# Patient Record
Sex: Female | Born: 1960 | Race: White | Hispanic: No | Smoking: Current every day smoker
Health system: Southern US, Community
[De-identification: ages and names within clinical notes are randomized; demographics above are authoritative.]

## PROBLEM LIST (undated history)

## (undated) DIAGNOSIS — M5430 Sciatica, unspecified side: Secondary | ICD-10-CM

## (undated) DIAGNOSIS — R112 Nausea with vomiting, unspecified: Secondary | ICD-10-CM

## (undated) DIAGNOSIS — I10 Essential (primary) hypertension: Secondary | ICD-10-CM

## (undated) DIAGNOSIS — J3489 Other specified disorders of nose and nasal sinuses: Secondary | ICD-10-CM

## (undated) DIAGNOSIS — C50412 Malignant neoplasm of upper-outer quadrant of left female breast: Principal | ICD-10-CM

## (undated) DIAGNOSIS — C50919 Malignant neoplasm of unspecified site of unspecified female breast: Secondary | ICD-10-CM

## (undated) DIAGNOSIS — E7800 Pure hypercholesterolemia, unspecified: Secondary | ICD-10-CM

## (undated) DIAGNOSIS — Z923 Personal history of irradiation: Secondary | ICD-10-CM

## (undated) DIAGNOSIS — Z9889 Other specified postprocedural states: Secondary | ICD-10-CM

## (undated) DIAGNOSIS — L309 Dermatitis, unspecified: Secondary | ICD-10-CM

## (undated) DIAGNOSIS — T4145XA Adverse effect of unspecified anesthetic, initial encounter: Secondary | ICD-10-CM

## (undated) DIAGNOSIS — F419 Anxiety disorder, unspecified: Secondary | ICD-10-CM

## (undated) DIAGNOSIS — M1990 Unspecified osteoarthritis, unspecified site: Secondary | ICD-10-CM

## (undated) DIAGNOSIS — T464X5A Adverse effect of angiotensin-converting-enzyme inhibitors, initial encounter: Secondary | ICD-10-CM

## (undated) DIAGNOSIS — F329 Major depressive disorder, single episode, unspecified: Secondary | ICD-10-CM

## (undated) DIAGNOSIS — R05 Cough: Secondary | ICD-10-CM

## (undated) DIAGNOSIS — T8859XA Other complications of anesthesia, initial encounter: Secondary | ICD-10-CM

## (undated) DIAGNOSIS — R3915 Urgency of urination: Secondary | ICD-10-CM

## (undated) DIAGNOSIS — K52832 Lymphocytic colitis: Secondary | ICD-10-CM

## (undated) DIAGNOSIS — G43909 Migraine, unspecified, not intractable, without status migrainosus: Secondary | ICD-10-CM

## (undated) DIAGNOSIS — F32A Depression, unspecified: Secondary | ICD-10-CM

## (undated) HISTORY — DX: Lymphocytic colitis: K52.832

## (undated) HISTORY — PX: WISDOM TOOTH EXTRACTION: SHX21

## (undated) HISTORY — DX: Depression, unspecified: F32.A

## (undated) HISTORY — PX: COLONOSCOPY: SHX174

## (undated) HISTORY — PX: TUBAL LIGATION: SHX77

## (undated) HISTORY — PX: KNEE ARTHROSCOPY W/ DEBRIDEMENT: SHX1867

## (undated) HISTORY — PX: TONSILLECTOMY: SUR1361

## (undated) HISTORY — DX: Major depressive disorder, single episode, unspecified: F32.9

## (undated) HISTORY — DX: Malignant neoplasm of upper-outer quadrant of left female breast: C50.412

## (undated) HISTORY — PX: BREAST BIOPSY: SHX20

## (undated) HISTORY — DX: Anxiety disorder, unspecified: F41.9

## (undated) HISTORY — PX: BREAST LUMPECTOMY: SHX2

---

## 1997-07-07 ENCOUNTER — Ambulatory Visit (HOSPITAL_COMMUNITY): Admission: RE | Admit: 1997-07-07 | Discharge: 1997-07-07 | Payer: Self-pay | Admitting: Obstetrics & Gynecology

## 1999-07-07 ENCOUNTER — Ambulatory Visit (HOSPITAL_BASED_OUTPATIENT_CLINIC_OR_DEPARTMENT_OTHER): Admission: RE | Admit: 1999-07-07 | Discharge: 1999-07-07 | Payer: Self-pay | Admitting: *Deleted

## 2005-06-30 ENCOUNTER — Other Ambulatory Visit: Admission: RE | Admit: 2005-06-30 | Discharge: 2005-06-30 | Payer: Self-pay | Admitting: Obstetrics & Gynecology

## 2005-07-12 ENCOUNTER — Encounter: Admission: RE | Admit: 2005-07-12 | Discharge: 2005-07-12 | Payer: Self-pay | Admitting: Obstetrics & Gynecology

## 2005-09-29 ENCOUNTER — Ambulatory Visit (HOSPITAL_BASED_OUTPATIENT_CLINIC_OR_DEPARTMENT_OTHER): Admission: RE | Admit: 2005-09-29 | Discharge: 2005-09-29 | Payer: Self-pay | Admitting: Obstetrics & Gynecology

## 2005-09-29 HISTORY — PX: ENDOMETRIAL ABLATION: SHX621

## 2005-09-29 HISTORY — PX: INCONTINENCE SURGERY: SHX676

## 2005-09-29 HISTORY — PX: CYSTO: SHX6284

## 2007-07-18 ENCOUNTER — Other Ambulatory Visit: Admission: RE | Admit: 2007-07-18 | Discharge: 2007-07-18 | Payer: Self-pay | Admitting: Obstetrics & Gynecology

## 2007-08-06 ENCOUNTER — Encounter: Admission: RE | Admit: 2007-08-06 | Discharge: 2007-08-06 | Payer: Self-pay | Admitting: Obstetrics & Gynecology

## 2007-10-10 ENCOUNTER — Ambulatory Visit: Payer: Self-pay | Admitting: Gastroenterology

## 2007-10-10 DIAGNOSIS — R1084 Generalized abdominal pain: Secondary | ICD-10-CM

## 2007-10-10 DIAGNOSIS — R197 Diarrhea, unspecified: Secondary | ICD-10-CM

## 2007-10-10 LAB — CONVERTED CEMR LAB: IgA: 109 mg/dL (ref 68–378)

## 2007-10-14 LAB — CONVERTED CEMR LAB: Tissue Transglutaminase Ab, IgA: 0.1 units (ref <7)

## 2007-10-30 ENCOUNTER — Encounter: Payer: Self-pay | Admitting: Gastroenterology

## 2007-10-30 ENCOUNTER — Ambulatory Visit: Payer: Self-pay | Admitting: Gastroenterology

## 2007-10-31 ENCOUNTER — Encounter: Payer: Self-pay | Admitting: Gastroenterology

## 2007-11-01 ENCOUNTER — Encounter: Payer: Self-pay | Admitting: Gastroenterology

## 2007-11-06 ENCOUNTER — Telehealth: Payer: Self-pay | Admitting: Gastroenterology

## 2007-12-12 ENCOUNTER — Ambulatory Visit: Payer: Self-pay | Admitting: Gastroenterology

## 2007-12-12 DIAGNOSIS — K5289 Other specified noninfective gastroenteritis and colitis: Secondary | ICD-10-CM

## 2008-01-10 ENCOUNTER — Encounter: Admission: RE | Admit: 2008-01-10 | Discharge: 2008-01-10 | Payer: Self-pay | Admitting: Orthopedic Surgery

## 2008-02-07 ENCOUNTER — Ambulatory Visit: Payer: Self-pay | Admitting: Gastroenterology

## 2008-02-07 DIAGNOSIS — R142 Eructation: Secondary | ICD-10-CM

## 2008-02-07 DIAGNOSIS — R141 Gas pain: Secondary | ICD-10-CM | POA: Insufficient documentation

## 2008-02-07 DIAGNOSIS — R143 Flatulence: Secondary | ICD-10-CM

## 2008-07-23 ENCOUNTER — Other Ambulatory Visit: Admission: RE | Admit: 2008-07-23 | Discharge: 2008-07-23 | Payer: Self-pay | Admitting: Obstetrics & Gynecology

## 2009-04-02 ENCOUNTER — Telehealth: Payer: Self-pay | Admitting: Gastroenterology

## 2010-02-23 ENCOUNTER — Telehealth: Payer: Self-pay | Admitting: Gastroenterology

## 2010-02-24 ENCOUNTER — Ambulatory Visit: Payer: Self-pay | Admitting: Internal Medicine

## 2010-02-24 DIAGNOSIS — I10 Essential (primary) hypertension: Secondary | ICD-10-CM | POA: Insufficient documentation

## 2010-02-24 DIAGNOSIS — F329 Major depressive disorder, single episode, unspecified: Secondary | ICD-10-CM

## 2010-02-24 DIAGNOSIS — F411 Generalized anxiety disorder: Secondary | ICD-10-CM | POA: Insufficient documentation

## 2010-06-07 NOTE — Progress Notes (Signed)
Summary: Medication refill  Phone Note Call from Patient Call back at Home Phone [phone]   Caller: Patient Call For: Dr. Russella Dar Reason for Call: Refill Medication Summary of Call: Needs refill on her Entocort.Marland KitchenMarland KitchenMarland KitchenSharl Ma [facility] Initial call taken by: Karna Christmas,  February 23, 2010 1:38 PM  Follow-up for Phone Call        Patient has hx of lymphocytic colitis.  Patient c/o several week hx of diarrhea , urgency, cramping.  Reports 5-6 loose watery BM day.  Denies any recent travel, antibiotic use, or exposure to others with diarrhea.  Denies fever, nausea, vomiting, or rectal bleeding.  She is requesting a refill of Entocort.  Patient  not seen here since 02/2008.  Dr Russella Dar please advise. Follow-up by: Darcey Nora RN, CGRN,  February 23, 2010 2:48 PM  Additional Follow-up for Phone Call Additional follow up Details #1::        Needs REV with [place] for ongoing treatment or can get treatment with her PCP. REV with GI extender in next 1-2 days for evaluation. If neither of those options work we can resume Entocort 9mg  by mouth qam for 4 weeks, no refills and REV with [place] within 4 week for additional refills and management.  Additional Follow-up by: Meryl Dare MD FACG,  February 23, 2010 3:16 PM    Additional Follow-up for Phone Call Additional follow up Details #2::    patient will come in and see Mike Gip PA at 3:00 Follow-up by: Darcey Nora RN, CGRN,  February 23, 2010 3:37 PM

## 2010-06-07 NOTE — Assessment & Plan Note (Signed)
Summary: diarrhea/ hx of lymphocytic colitis/sheri   History of Present Illness Visit Type: Follow-up Visit Primary GI MD: Elie Goody MD [facility] Primary Provider: Benedetto Goad, MD Requesting Provider: na Chief Complaint: Lymphocytic Colitis. Pt c/o diarrhea  History of Present Illness:   PLEASANT 50 YO FEMALE, KNOWN TO DR. SATRK WITH HX OF LYMPHOCYTIC COLITIS. SHE WAS DX IN 2009, AND HAS RESPONDED WELL TO ENTOCORT. SHE HAS NOT BEEN SEEN FOR OVER A YEAR, AND SAYS SHE HAD BEEN DOING WELL WITH NO SXS. SHE HAD ONSET ABOUT 2 WEEKS AGO WITH RECURRENT DIARRHEA. SXS HAVE PROGRESSED AND SHE IS NOW HAVING MULTIPLE WATERY STOOLS PER DAY. NO N/V, APPETITE OK,NO FEVER/CHILLS. NO BLEEDING. 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 NO NEW MEDS OVER THE PAST FEW WEEKS-DID START CYMBALTA ,AND KLONOPIN 6-8 WEEKS AGO, NO RECENT ABX.   GI Review of Systems      Denies abdominal pain, acid reflux, belching, bloating, chest pain, dysphagia with liquids, dysphagia with solids, heartburn, loss of appetite, nausea, vomiting, vomiting blood, weight loss, and  weight gain.      Reports diarrhea.     Denies anal fissure, black tarry stools, change in bowel habit, constipation, diverticulosis, fecal incontinence, heme positive stool, hemorrhoids, irritable bowel syndrome, jaundice, light color stool, liver problems, rectal bleeding, and  rectal pain.    Current Medications (verified): 1)  Methocarbamol 500 Mg  Tabs (Methocarbamol) .... As Needed Q8h 2)  Excedrin Migraine 250-250-65 Mg  Tabs (Aspirin-Acetaminophen-Caffeine) .... As Needed 3)  Vitamin B-12 Cr 1000 Mcg  Tbcr (Cyanocobalamin) .... Once Daily 4)  Vitamin C 1000 Mg  Tabs (Ascorbic Acid) .... Once Daily 5)  Calcium Citrate-Vitamin D 1500-200 Mg-Unit  Tabs (Calcium Citrate-Vitamin D) .... Once Daily 6)  Ibutab 200 Mg  Tabs (Ibuprofen) .... As Needed 7)  Entocort Ec 3 Mg  Cp24  (Budesonide) .... Take 3 Capsules Daily(Need Refill) 8)  Cymbalta 60 Mg Cpep (Duloxetine Hcl) .... One Tablet By Mouth Once Daily 9)  Klonopin 1 Mg Tabs (Clonazepam) .Marland Kitchen.. 1 To 1/2 By Mouth Two Times A Day  Allergies (verified): 1)  ! * Tape 2)  ! * Latex  Past History:  Past Medical History: Reviewed history from 02/06/2008 and no changes required. Anxiety Disorder Arthritis Chronic Headaches Depression Hypertension Lympocytic colitis Internal hemorrhoids Menometrorrhagia Cystocele  Past Surgical History: C-section x2 1989 & 1997 Uterine Oblation -2007 COLONOSCOPY 2009  Family History: Reviewed history from 10/10/2007 and no changes required. Family History of Colon Cancer: father age 41-65 Family History of Colon Polyps: Family History of Diabetes:   Social History: Patient currently smokes.  less than 1 ppd Alcohol Use - no Patient does not get regular exercise.  Pt is divorced with 2 children  Review of Systems  The patient denies allergy/sinus, anemia, anxiety-new, arthritis/joint pain, back pain, blood in urine, breast changes/lumps, change in vision, confusion, cough, coughing up blood, depression-new, fainting, fatigue, fever, headaches-new, hearing problems, heart murmur, heart rhythm changes, itching, menstrual pain, muscle pains/cramps, night sweats, nosebleeds, pregnancy symptoms, shortness of breath, skin rash, sleeping problems, sore throat, swelling of feet/legs, swollen lymph glands, thirst - excessive , urination - excessive , urination changes/pain, urine leakage, vision changes, and voice change.         SEE HPI  Vital Signs:  Patient profile:   50 year old female Height:      68 inches Weight:  193 pounds BMI:     29.45 BSA:     2.01 Pulse rate:   76 / minute Pulse rhythm:   regular BP sitting:   128 / 86  (left arm) Cuff size:   regular  Vitals Entered By: Ok Anis CMA (February 24, 2010 3:09 PM)  Physical Exam  General:  Well  developed, well nourished, no acute distress. Head:  Normocephalic and atraumatic. Eyes:  PERRLA, no icterus. Lungs:  Clear throughout to auscultation. Heart:  Regular rate and rhythm; no murmurs, rubs,  or bruits. Abdomen:  SOFT, NO FOCAL TENDERNESS, NO MASS OR HSM,BS+ Rectal:  NOT DONE Extremities:  No clubbing, cyanosis, edema or deformities noted. Neurologic:  Alert and  oriented x4;  grossly normal neurologically. Psych:  Alert and cooperative. Normal mood and affect.   Impression & Recommendations:  Problem # 1:  OTH&UNSPEC NONINFECTIOUS GASTROENTERITIS&COLITIS (ICD-558.9) Assessment Deteriorated 50 YO FEMALE WITH KNOWN LYMPHOCYTIC COLITIS WITH EXACERBATION  START ENTOCORT 3MG - 3 TABLETS DAILY X ONE MONTH, THEN IF DOING WELL TAPER TO 6 MG DAILY X ONE MONTH, THEN TO 3 MG DAILY X ONE MONTH. PT ADVISED TO CALL IN THE INTERIM FOR PROBLEMS,INCREASED DIARRHEA ETC. BENTYL 10 MG TWICE DAILY  AS NEEDED. DISCUSSED AVOIDANCE OF NSAIDS. ROV WITH DR,. SRARK AS NEEDED  Problem # 2:  CARCINOMA, COLON, FAMILY HX (ICD-V16.0) Assessment: Comment Only DUE FOR FOLLOW UP COLONOSCOPY 2014  Problem # 3:  ANXIETY (ICD-300.00) Assessment: Comment Only  Problem # 4:  HYPERTENSION (ICD-401.9) Assessment: Comment Only  Problem # 5:  DEPRESSION (ICD-311) Assessment: Comment Only  Patient Instructions: 1)  On the Entocort, take 9 mg ( 3 tab ) daily for 1 month, then 6 mg ( 2 tab) daily for 1 month,  then 3 mg (1 tab) . 2)  We sent the prescription for Entocort to [facility]. 3)  We also sent a prescription for Bentyl 10 mg for spams and cramping. 4)  Copy sent to : Benedetto Goad, Md 5)  The medication list was reviewed and reconciled.  All changed / newly prescribed medications were explained.  A complete medication list was provided to the patient / caregiver. Prescriptions: ENTOCORT EC 3 MG XR24H-CAP (BUDESONIDE) Tale 3 tab daily  #90 x 4   Entered by:   Lowry Ram NCMA   Authorized  by:   Sammuel Cooper PA-c   Signed by:   Lowry Ram NCMA on 02/24/2010   Method used:   Electronically to        [facility] (retail)       [address]       [place], [place]  [address]       Ph: [phone]       Fax: [phone]   RxID:   [id number] BENTYL 10 MG CAPS (DICYCLOMINE HCL) Take 1 tab twice daily as needed for cramping  #20 x 1   Entered by:   Lowry Ram NCMA   Authorized by:   Sammuel Cooper PA-c   Signed by:   Lowry Ram NCMA on 02/24/2010   Method used:   Electronically to        [id number] (retail)       [address]       [place], [place]  [address]       Ph: [phone]       Fax: [phone]   RxID:   [id number]

## 2010-09-23 NOTE — Op Note (Signed)
NAMEGAIL, [name]             ACCOUNT NO.:  [id number]   MEDICAL RECORD NO.:  [id number]          PATIENT TYPE:  AMB   LOCATION:  [facility]                         FACILITY:  [facility]   PHYSICIAN:  Jamison Neighbor, M.D.  DATE OF BIRTH:  1960/07/09   DATE OF PROCEDURE:  09/29/2005  DATE OF DISCHARGE:                                 OPERATIVE REPORT   PREOPERATIVE DIAGNOSES:  1.  Menometrorrhagia.  2.  Stress urinary incontinence.  3.  Cystocele.   POSTOPERATIVE DIAGNOSES:  1.  Menometrorrhagia.  2.  Stress urinary incontinence.  3.  Cystocele.   PROCEDURE:  1.  Hysteroscopy with ablation by Dr. Judie Petit. Leda Quail.  2.  Cystoscopy with anterior repair and sling by Dr. Jamison Neighbor.   ANESTHESIA:  General.   COMPLICATIONS:  None.   DRAINS:  None.   INDICATIONS FOR PROCEDURE:  This 50 year old female was scheduled to undergo  an endometrial ablation by Dr. Hyacinth Meeker, had some problems with stress urinary  incontinence as well as a modest cystocele.  The patient would like to have  a pubo-vaginal sling placed at the time of her surgery.  She understands the  risks and benefits of the procedure, including the possibility that she will  have some voiding dysfunction postoperatively including urgency or urgency  incontinence, as well as difficulty emptying.  She gave a full informed  consent.   DESCRIPTION OF PROCEDURE:  After the successful induction of general  anesthesia the patient was placed in the dorsal lithotomy position and  prepped with Betadine and draped in the usual sterile fashion.  After  completion of her procedure by Dr. Hyacinth Meeker, inspection showed that she had  modest cystocele and some urethral immobility.  She did not have a big  rectocele.  She had a normal uterus and certainly is not felt to be a  candidate for a hysterectomy.  The anterior vaginal mucosa was infiltrated  with local anesthetic.  An incision was made beginning at the mid-urethra  and extending  back towards the uterus.  The flaps were raised bilaterally.  Hemostasis was obtained with electrocautery.  Horizontal mattress sutures of  #2-0 Vicryl were used to reduce her small cystocele.  This was certainly not  large and it was not felt that a mesh repair was required.  The dissection  proceeded posteriorly back to, but not through the endo-pelvic fascia.  After that had been set up, two small stab incisions were made approximately  three finger breadths apart directly above the pubic bone.  A guide needle  was then passed from the two abdominal incisions, down to the vaginal  incision, piercing through the endocervical fascia.  The bladder was then  carefully inspected with both 12-degree and 70-degree lenses.  There was no  injury to the bladder on either side, but on the right-hand side it was felt  that the needle was a little closer than was optimal, so this was  repositioned and the cystoscopy was repeated.  Once again there was no  injury to the bladder.  The needles appeared to be in a good position  and  the bladder was otherwise normal.  The patient does have a smoking history  and had a very careful inspection of the bladder with no tumors or stones  seen.  The ureteral orifices were felt to be unremarkable in their  appearance.  The sling was then pulled into position.  A repeat cystoscopy  showed that there was nice coaptation of the bladder neck but no excessive  angulation, and it appeared that there was good support for the bladder  neck.  The sling was then tensioned so that there was a space between the  urethra and the sling itself, large enough to take a Mayo scissors, so that  there was no over-correction.  The protective sheath was cut away and the  sling was then trimmed at the skin level.  The incision was irrigated and  the excessive mucosa was trimmed.  The incision was then closed with a  running suture of #2-0 Vicryl.  The final inspection showed that good   support had been obtained.  There did not appear to be excessive angulation.   The patient tolerated the procedure and was taken to the recovery room in  good condition.   DISPOSITION:  She will be sent home if she urinates normally.  Otherwise she  will go home for a few days with a Foley catheter.           ______________________________  Jamison Neighbor, M.D.  Electronically Signed     RJE/MEDQ  D:  09/29/2005  T:  09/29/2005  Job:  [id number]   cc:   M. Leda Quail, MD

## 2010-09-23 NOTE — Op Note (Signed)
NAME:  Anna, Barnes             ACCOUNT NO.:  [id number]   MEDICAL RECORD NO.:  [id number]          PATIENT TYPE:  AMB   LOCATION:  [facility]                         FACILITY:  [facility]   PHYSICIAN:  M. Leda Quail, MD  DATE OF BIRTH:  [birth year]   DATE OF PROCEDURE:  09/29/2005  DATE OF DISCHARGE:                                 OPERATIVE REPORT   PREOPERATIVE DIAGNOSES:  68.  A 50 year old gravida 3, para 2, married white female with dysfunctional      uterine bleeding and menorrhagia.  2.  Negative endometrial biopsy.  3.  Stress urinary incontinence.  4.  Hypertension.  5.  History of bilateral tubal ligation.  6.  Smoking history.   POSTOPERATIVE DIAGNOSES:  91.  A 50 year old gravida 3, para 2, married white female with dysfunctional      uterine bleeding and menorrhagia.  2.  Negative endometrial biopsy.  3.  Stress urinary incontinence.  4.  Hypertension.  5.  History of bilateral tubal ligation.  6.  Smoking history.   PROCEDURE:  1.  Hydrothermal ablation.  2.  Spark urethral sling placed by Dr. Jamison Neighbor.  He will dictate      this portion of the procedure.   SURGEON:  M. Leda Quail, M.D.   ASSISTANT:  None, except for the operating room staff.   ANESTHESIA:  General endotracheal anesthesia.   ANESTHESIOLOGIST:  Dr. Leta Jungling   SPECIMENS:  None.   ESTIMATED BLOOD LOSS:  Minimal.   COMPLICATIONS:  None.   URINE OUTPUT:  Was 75 mL of clear urine, drained at the end of the case.   FLUIDS:  Was 100 mL in the operating room.   INDICATIONS FOR PROCEDURE:  [name] is a 50 year old G-3, P-2, A-1  married white female with a history of DUB and menorrhagia.  She has had an  HTA performed that showed a parous uterus and cervix, without any evidence  of polyps.  Treatment options were discussed with her.  Due to her smoking  history, she is not a candidate for birth control pills.  Due to the size of  her uterus, she would not be a great candidate for a  [place] IUD.  Therefore  an HTA was discussed with the patient.  She also was going to need a  urologic procedure performed.  She opted to do this at the same time.  She  also had a preoperative endometrial biopsy which showed features suggestive  of a benign secretory endometrium.  The patient was pre-treated for  approximately one month with Depo-Lupron and a preoperative ultrasound  showed endometrial thickness of less than 4 mm.   DESCRIPTION OF PROCEDURE:  The patient was taken to the operating room.  She  was placed in the supine position.  She does have some back problems and she  was positioned in the [place] stirrups in the low lithotomy position before  any anesthesia was administered.  A crate of [employer]-Support was placed under  the small of her back, and she was comfortable before anesthesia was  administered.  General endotracheal anesthesia was administered  by the  anesthesia staff without difficulty.  Her legs were then positioned in the  high lithotomy position.  The abdomen, perineum and her thighs were prepped  and draped in the normal sterile fashion.  A red rubber Foley catheter was  used to drain the bladder of all urine.  A bi-valve speculum was placed in  the vagina.  The cervix was visualized.  The anterior of the cervix was  grasped with a single tooth tenaculum.  The uterus was sounded to 9 cm.  Then using Pratt dilators, the uterus was dilated up to a #23.  The  hysteroscope with the HTA apparatus was attached to it was advanced through  the endocervical canal.  Tubal ostia were noted bilaterally and photo  documentation was made.  The top of the endometrium was noted and photo  documentation was made as well.  It did appear to fit at this point.  The  hysteroscope with the HTA attachment was withdrawn from the endometrial  cavity to the level of the internal os, then advanced slightly forward.  At  this point the heating cycle was initiated.  The reservoir was at 80  mL of  water.  There was stability noted in the reservoir during the heating  process.  The sterile saline in the HTA system was heated to 90 degrees  Celsius.  Once the heating cycle was completed, then a 10-minute ablation  cycle was performed.  Throughout the procedure the operator watched either  the hysteroscope at the os, to look for any fluid leaking, and also at the  video monitor to monitor the positioning of the hysteroscope in the  endometrial cavity for proper positioning.  There was no fluid leakage  through the entire procedure.  The hysteroscope remained stable through the  entire ablation process.  After the 10-minute ablation process was  completed, a cooling cycle was performed.  Excellent placement of the  hysteroscope through the entire procedure was again noted.  The endometrial  cavity appeared to have a good ablation cycle, with no significant air  bubbles present.  The entire cavity appeared to have received a good  treatment cycle.  Photo documentation was made.  At this point the  hysteroscope was removed from the endocervical canal.  The single tooth  tenaculum was removed from the anterior lip of the cervix.  There was no  bleeding noted.   The speculum was removed from the vagina at this point, and Dr. Logan Bores was  present for the remainder of the procedure, which he did initiate.  At this  point in the procedure, the sponge, lap and instrument counts were correct  x2.  There were no issues to follow through this portion of the procedure.      Lum Keas, MD  Electronically Signed     MSM/MEDQ  D:  09/29/2005  T:  09/29/2005  Job:  [id number]

## 2010-11-03 ENCOUNTER — Emergency Department (INDEPENDENT_AMBULATORY_CARE_PROVIDER_SITE_OTHER): Payer: Self-pay

## 2010-11-03 ENCOUNTER — Emergency Department (HOSPITAL_BASED_OUTPATIENT_CLINIC_OR_DEPARTMENT_OTHER)
Admission: EM | Admit: 2010-11-03 | Discharge: 2010-11-03 | Disposition: A | Discharge status: Home / Self Care | Payer: Self-pay | Attending: Emergency Medicine | Admitting: Emergency Medicine

## 2010-11-03 DIAGNOSIS — M7989 Other specified soft tissue disorders: Secondary | ICD-10-CM | POA: Insufficient documentation

## 2010-11-03 DIAGNOSIS — F17200 Nicotine dependence, unspecified, uncomplicated: Secondary | ICD-10-CM | POA: Insufficient documentation

## 2010-11-03 DIAGNOSIS — I10 Essential (primary) hypertension: Secondary | ICD-10-CM | POA: Insufficient documentation

## 2010-11-03 DIAGNOSIS — E785 Hyperlipidemia, unspecified: Secondary | ICD-10-CM | POA: Insufficient documentation

## 2010-11-03 DIAGNOSIS — M5137 Other intervertebral disc degeneration, lumbosacral region: Secondary | ICD-10-CM

## 2010-11-03 DIAGNOSIS — M25559 Pain in unspecified hip: Secondary | ICD-10-CM

## 2010-11-03 DIAGNOSIS — M5126 Other intervertebral disc displacement, lumbar region: Secondary | ICD-10-CM

## 2010-11-03 DIAGNOSIS — M5430 Sciatica, unspecified side: Secondary | ICD-10-CM | POA: Insufficient documentation

## 2010-11-03 DIAGNOSIS — M545 Low back pain: Secondary | ICD-10-CM

## 2010-11-03 LAB — URINALYSIS, ROUTINE W REFLEX MICROSCOPIC
Bilirubin Urine: NEGATIVE
Ketones, ur: NEGATIVE mg/dL
Nitrite: NEGATIVE
Protein, ur: NEGATIVE mg/dL
pH: 6 (ref 5.0–8.0)

## 2010-11-03 LAB — BASIC METABOLIC PANEL
BUN: 12 mg/dL (ref 6–23)
Glucose, Bld: 106 mg/dL — ABNORMAL HIGH (ref 70–99)
Potassium: 3.5 mEq/L (ref 3.5–5.1)
Sodium: 138 mEq/L (ref 135–145)

## 2010-11-03 LAB — CBC
MCH: 29.9 pg (ref 26.0–34.0)
MCHC: 34.1 g/dL (ref 30.0–36.0)
RDW: 13.9 % (ref 11.5–15.5)
WBC: 16.2 10*3/uL — ABNORMAL HIGH (ref 4.0–10.5)

## 2010-11-03 LAB — DIFFERENTIAL
Eosinophils Relative: 6 % — ABNORMAL HIGH (ref 0–5)
Lymphocytes Relative: 26 % (ref 12–46)
Monocytes Absolute: 0.8 10*3/uL (ref 0.1–1.0)
Monocytes Relative: 5 % (ref 3–12)
Neutrophils Relative %: 62 % (ref 43–77)

## 2010-11-04 ENCOUNTER — Ambulatory Visit (HOSPITAL_BASED_OUTPATIENT_CLINIC_OR_DEPARTMENT_OTHER)
Admission: RE | Admit: 2010-11-04 | Discharge: 2010-11-04 | Disposition: A | Discharge status: Home / Self Care | Payer: Self-pay | Source: Ambulatory Visit | Attending: Emergency Medicine | Admitting: Emergency Medicine

## 2010-11-04 DIAGNOSIS — M25559 Pain in unspecified hip: Secondary | ICD-10-CM | POA: Insufficient documentation

## 2010-11-04 DIAGNOSIS — M79609 Pain in unspecified limb: Secondary | ICD-10-CM | POA: Insufficient documentation

## 2011-02-10 ENCOUNTER — Encounter: Payer: Self-pay | Admitting: Obstetrics and Gynecology

## 2011-03-10 ENCOUNTER — Encounter: Payer: Self-pay | Admitting: Obstetrics and Gynecology

## 2011-08-08 ENCOUNTER — Other Ambulatory Visit: Payer: Self-pay

## 2011-08-08 ENCOUNTER — Emergency Department (INDEPENDENT_AMBULATORY_CARE_PROVIDER_SITE_OTHER): Payer: Self-pay

## 2011-08-08 ENCOUNTER — Inpatient Hospital Stay (HOSPITAL_COMMUNITY): Payer: Self-pay

## 2011-08-08 ENCOUNTER — Encounter (HOSPITAL_BASED_OUTPATIENT_CLINIC_OR_DEPARTMENT_OTHER): Payer: Self-pay | Admitting: Emergency Medicine

## 2011-08-08 ENCOUNTER — Inpatient Hospital Stay (HOSPITAL_BASED_OUTPATIENT_CLINIC_OR_DEPARTMENT_OTHER)
Admission: EM | Admit: 2011-08-08 | Discharge: 2011-08-09 | DRG: 069 | Disposition: A | Discharge status: Home / Self Care | Payer: Self-pay | Attending: Emergency Medicine | Admitting: Emergency Medicine

## 2011-08-08 DIAGNOSIS — F17200 Nicotine dependence, unspecified, uncomplicated: Secondary | ICD-10-CM | POA: Diagnosis present

## 2011-08-08 DIAGNOSIS — E7800 Pure hypercholesterolemia, unspecified: Secondary | ICD-10-CM | POA: Diagnosis present

## 2011-08-08 DIAGNOSIS — G9389 Other specified disorders of brain: Secondary | ICD-10-CM

## 2011-08-08 DIAGNOSIS — K5289 Other specified noninfective gastroenteritis and colitis: Secondary | ICD-10-CM | POA: Diagnosis present

## 2011-08-08 DIAGNOSIS — E876 Hypokalemia: Secondary | ICD-10-CM | POA: Diagnosis present

## 2011-08-08 DIAGNOSIS — I10 Essential (primary) hypertension: Secondary | ICD-10-CM | POA: Diagnosis present

## 2011-08-08 DIAGNOSIS — Z91040 Latex allergy status: Secondary | ICD-10-CM

## 2011-08-08 DIAGNOSIS — E669 Obesity, unspecified: Secondary | ICD-10-CM | POA: Diagnosis present

## 2011-08-08 DIAGNOSIS — G459 Transient cerebral ischemic attack, unspecified: Secondary | ICD-10-CM | POA: Diagnosis present

## 2011-08-08 DIAGNOSIS — K529 Noninfective gastroenteritis and colitis, unspecified: Secondary | ICD-10-CM

## 2011-08-08 DIAGNOSIS — E785 Hyperlipidemia, unspecified: Secondary | ICD-10-CM | POA: Diagnosis present

## 2011-08-08 DIAGNOSIS — G43909 Migraine, unspecified, not intractable, without status migrainosus: Secondary | ICD-10-CM | POA: Diagnosis present

## 2011-08-08 DIAGNOSIS — R269 Unspecified abnormalities of gait and mobility: Secondary | ICD-10-CM | POA: Diagnosis present

## 2011-08-08 DIAGNOSIS — Z6830 Body mass index (BMI) 30.0-30.9, adult: Secondary | ICD-10-CM

## 2011-08-08 DIAGNOSIS — E860 Dehydration: Secondary | ICD-10-CM | POA: Diagnosis present

## 2011-08-08 DIAGNOSIS — Z79899 Other long term (current) drug therapy: Secondary | ICD-10-CM

## 2011-08-08 DIAGNOSIS — G458 Other transient cerebral ischemic attacks and related syndromes: Principal | ICD-10-CM | POA: Diagnosis present

## 2011-08-08 DIAGNOSIS — K52832 Lymphocytic colitis: Secondary | ICD-10-CM | POA: Diagnosis present

## 2011-08-08 DIAGNOSIS — Z7982 Long term (current) use of aspirin: Secondary | ICD-10-CM

## 2011-08-08 HISTORY — DX: Pure hypercholesterolemia, unspecified: E78.00

## 2011-08-08 HISTORY — DX: Essential (primary) hypertension: I10

## 2011-08-08 HISTORY — DX: Migraine, unspecified, not intractable, without status migrainosus: G43.909

## 2011-08-08 HISTORY — DX: Unspecified osteoarthritis, unspecified site: M19.90

## 2011-08-08 LAB — DIFFERENTIAL
Basophils Relative: 1 % (ref 0–1)
Eosinophils Absolute: 0.9 10*3/uL — ABNORMAL HIGH (ref 0.0–0.7)
Eosinophils Relative: 7 % — ABNORMAL HIGH (ref 0–5)
Lymphocytes Relative: 23 % (ref 12–46)
Lymphs Abs: 2.9 10*3/uL (ref 0.7–4.0)
Neutro Abs: 8.5 10*3/uL — ABNORMAL HIGH (ref 1.7–7.7)
Neutrophils Relative %: 66 % (ref 43–77)

## 2011-08-08 LAB — PROTIME-INR: INR: 0.92 (ref 0.00–1.49)

## 2011-08-08 LAB — BASIC METABOLIC PANEL
BUN: 10 mg/dL (ref 6–23)
Calcium: 9 mg/dL (ref 8.4–10.5)
Chloride: 99 mEq/L (ref 96–112)
GFR calc non Af Amer: >90 mL/min (ref >90)
Sodium: 137 mEq/L (ref 135–145)

## 2011-08-08 LAB — CBC
HCT: 42.5 % (ref 36.0–46.0)
HCT: 41.8 % (ref 36.0–46.0)
Hemoglobin: 15.2 g/dL — ABNORMAL HIGH (ref 12.0–15.0)
MCH: 30.6 pg (ref 26.0–34.0)
MCH: 30.6 pg (ref 26.0–34.0)
MCHC: 35.2 g/dL (ref 30.0–36.0)
MCV: 86.9 fL (ref 78.0–100.0)
Platelets: 271 10*3/uL (ref 150–400)
RDW: 13.6 % (ref 11.5–15.5)
RDW: 13.4 % (ref 11.5–15.5)

## 2011-08-08 MED ORDER — AMLODIPINE BESYLATE 5 MG PO TABS
5.0000 mg | ORAL_TABLET | Freq: Every day | ORAL | Status: DC
  Administered 2011-08-08: 5 mg via ORAL
  Filled 2011-08-08 (×2): qty 1

## 2011-08-08 MED ORDER — LISINOPRIL 20 MG PO TABS
20.0000 mg | ORAL_TABLET | Freq: Every day | ORAL | Status: DC
  Administered 2011-08-08: 20 mg via ORAL
  Filled 2011-08-08 (×2): qty 1

## 2011-08-08 MED ORDER — SENNOSIDES-DOCUSATE SODIUM 8.6-50 MG PO TABS: 1.0000 | ORAL_TABLET | Freq: Every evening | ORAL | Status: DC | PRN

## 2011-08-08 MED ORDER — LISINOPRIL-HYDROCHLOROTHIAZIDE 20-12.5 MG PO TABS: 1.0000 | ORAL_TABLET | Freq: Every day | ORAL | Status: DC

## 2011-08-08 MED ORDER — POTASSIUM CHLORIDE CRYS ER 20 MEQ PO TBCR
40.0000 meq | EXTENDED_RELEASE_TABLET | Freq: Once | ORAL | Status: AC
  Administered 2011-08-08: 40 meq via ORAL
  Filled 2011-08-08 (×2): qty 1

## 2011-08-08 MED ORDER — OXYCODONE HCL 5 MG PO TABS: 5.0000 mg | ORAL_TABLET | Freq: Once | ORAL | Status: DC

## 2011-08-08 MED ORDER — POTASSIUM CHLORIDE 10 MEQ/100ML IV SOLN
10.0000 meq | INTRAVENOUS | Status: AC
  Administered 2011-08-08 (×2): 10 meq via INTRAVENOUS
  Filled 2011-08-08 (×2): qty 100

## 2011-08-08 MED ORDER — HYDROCHLOROTHIAZIDE 12.5 MG PO CAPS
12.5000 mg | ORAL_CAPSULE | Freq: Every day | ORAL | Status: DC
  Administered 2011-08-08: 12.5 mg via ORAL
  Filled 2011-08-08 (×2): qty 1

## 2011-08-08 MED ORDER — ASPIRIN 325 MG PO TABS
325.0000 mg | ORAL_TABLET | Freq: Every day | ORAL | Status: DC
  Administered 2011-08-08 – 2011-08-09 (×2): 325 mg via ORAL
  Filled 2011-08-08 (×3): qty 1

## 2011-08-08 MED ORDER — ASPIRIN 300 MG RE SUPP
300.0000 mg | Freq: Every day | RECTAL | Status: DC
  Filled 2011-08-08 (×2): qty 1

## 2011-08-08 MED ORDER — ENOXAPARIN SODIUM 40 MG/0.4ML ~~LOC~~ SOLN
40.0000 mg | Freq: Every day | SUBCUTANEOUS | Status: DC
  Administered 2011-08-08: 40 mg via SUBCUTANEOUS
  Filled 2011-08-08 (×2): qty 0.4

## 2011-08-08 MED ORDER — POTASSIUM CHLORIDE CRYS ER 20 MEQ PO TBCR
40.0000 meq | EXTENDED_RELEASE_TABLET | Freq: Two times a day (BID) | ORAL | Status: DC
  Administered 2011-08-08 – 2011-08-09 (×2): 40 meq via ORAL
  Filled 2011-08-08 (×3): qty 2

## 2011-08-08 MED ORDER — POTASSIUM CHLORIDE 10 MEQ/100ML IV SOLN
10.0000 meq | INTRAVENOUS | Status: AC
  Administered 2011-08-08: 10 meq via INTRAVENOUS
  Filled 2011-08-08: qty 100

## 2011-08-08 NOTE — ED Notes (Signed)
Patient transported to MRI 

## 2011-08-08 NOTE — ED Notes (Signed)
Potassium infusing pt states she wont get in a gown is sitting up in chair in room  MD to bedside to discuss with patient her conversation with the neurologist who advises that she be transferred to [facility] cone for further evaluation and work up.for her unsteady gait when pt stands and attempts to walk she leans to the left and is unable to correct her gait pt automatically walks to the left

## 2011-08-08 NOTE — ED Notes (Signed)
Pt to restroom with assistance pt adamant that she remain dressed and ambulate explained to pt that we must accompany her to be sure she doesn't fall due to the symptoms she is experiencing

## 2011-08-08 NOTE — Progress Notes (Signed)
Speech Pathology Note Pt passed stroke swallow screen and will initiate diet. Spoke with PA who reports swallow eval not necessary. SLP will complete screen of language, speech, cognition tomorrow. Harlon Ditty, MA CCC-SLP [phone]

## 2011-08-08 NOTE — ED Notes (Signed)
Pt c/o "off balance"- sts she is unable to walk straight, goes to left instead.  No other neuro deficits noted at this time.  C/o HA (posterior left side) last pm.

## 2011-08-08 NOTE — ED Provider Notes (Addendum)
History     CSN: [id number]  Arrival date & time 08/08/11  0856   None     Chief Complaint  Patient presents with  . Gait Problem    (Consider location/radiation/quality/duration/timing/severity/associated sxs/prior treatment) Patient is a 51 y.o. female presenting with neurologic complaint. The history is provided by the patient.  Neurologic Problem The primary symptoms include headaches and vomiting. Primary symptoms do not include loss of consciousness, altered mental status, dizziness, visual change, paresthesias, focal weakness, loss of sensation, speech change, fever or nausea. Primary symptoms comment: gait difficulty since waking up with morning.  Continues to fall to the left with walking Episode onset: unknown.  LSN was when she went to bed last night. The symptoms are unchanged. The neurological symptoms are focal. Context: she did have a left sided posterior HA last night which has since resolved.  The headache is associated with loss of balance. The headache is not associated with visual change, neck stiffness, paresthesias or weakness.  Additional symptoms include loss of balance. Additional symptoms do not include neck stiffness, weakness, lower back pain, leg pain, nystagmus, taste disturbance, hyperacusis, hearing loss or vertigo. Medical issues also include hypertension. Medical issues do not include seizures, alcohol use, drug use, diabetes or recent surgery.    Past Medical History  Diagnosis Date  . Arthritis   . Migraines   . Hypertension   . High cholesterol     Past Surgical History  Procedure Date  . Incontinence surgery     No family history on file.  History  Substance Use Topics  . Smoking status: Current Everyday Smoker  . Smokeless tobacco: Not on file  . Alcohol Use: Yes     minimal    OB History    Grav Para Term Preterm Abortions TAB SAB Ect Mult Living                  Review of Systems  Constitutional: Negative for fever and  chills.  HENT: Negative for hearing loss, sore throat and neck stiffness.   Eyes: Negative for visual disturbance.  Respiratory: Negative for cough and shortness of breath.   Cardiovascular: Negative for chest pain.  Gastrointestinal: Positive for vomiting. Negative for nausea.  Musculoskeletal: Positive for gait problem.  Skin: Negative for rash.  Neurological: Positive for headaches and loss of balance. Negative for dizziness, vertigo, speech change, focal weakness, loss of consciousness, syncope, speech difficulty, weakness, numbness and paresthesias.  Psychiatric/Behavioral: Negative for altered mental status.  All other systems reviewed and are negative.    Allergies  Latex  Home Medications  No current outpatient prescriptions on file.  BP 143/87  Pulse 84  Temp(Src) 98.4 F (36.9 C) (Oral)  Resp 16  SpO2 99%  Physical Exam  Nursing note and vitals reviewed. Constitutional: She is oriented to person, place, and time. She appears well-developed and well-nourished. No distress.  HENT:  Head: Normocephalic and atraumatic.  Mouth/Throat: Oropharynx is clear and moist.  Eyes: EOM are normal. Pupils are equal, round, and reactive to light.  Neck: Normal range of motion. Neck supple.  Cardiovascular: Normal rate, regular rhythm, normal heart sounds and intact distal pulses.  Exam reveals no friction rub.   No murmur heard. Pulmonary/Chest: Effort normal and breath sounds normal. She has no wheezes. She has no rales.  Abdominal: Soft. Bowel sounds are normal. She exhibits no distension. There is no tenderness. There is no rebound and no guarding.  Musculoskeletal: Normal range of motion. She exhibits no  tenderness.       No edema  Lymphadenopathy:    She has no cervical adenopathy.  Neurological: She is alert and oriented to person, place, and time. She has normal strength. No cranial nerve deficit or sensory deficit. She displays a negative Romberg sign. Gait abnormal. GCS  eye subscore is 4. GCS verbal subscore is 5. GCS motor subscore is 6.       With ambulation pt falls to the left and ran into the left side of door frame walking out of the room.  Has to constantly hold on to something to regain balance.  With standing she is normal and has neg romberg but unable to balance on left foot but can balance on the right foot for short time period.  Skin: Skin is warm and dry. No rash noted.  Psychiatric: She has a normal mood and affect. Her behavior is normal.    ED Course  Procedures (including critical care time)  Labs Reviewed  CBC - Abnormal; Notable for the following:    WBC 13.0 (*) RESULT REPEATED AND VERIFIED   Hemoglobin 15.2 (*)    All other components within normal limits  DIFFERENTIAL - Abnormal; Notable for the following:    Neutro Abs 8.5 (*)    Eosinophils Relative 7 (*)    Eosinophils Absolute 0.9 (*)    All other components within normal limits  BASIC METABOLIC PANEL - Abnormal; Notable for the following:    Potassium 2.7 (*)    Glucose, Bld 127 (*)    All other components within normal limits  PROTIME-INR  APTT   Dg Chest 2 View  08/08/2011  *RADIOLOGY REPORT*  Clinical Data: Unsteady balance.  Smoker, hypertension.  CHEST - 2 VIEW  Comparison: None.  Findings: Rounded nodular appearing area is noted just superior to the right hilum.  I favor this represents a prominent azygos vein rather than a true nodule.  Heart is borderline enlarged.  Slight peribronchial thickening.  No confluent airspace opacities or effusions.  No acute bony abnormality.  IMPRESSION: Slight bronchitic changes.  Borderline heart size.  Original Report Authenticated By: Cyndie Chime, M.D.   Ct Head Wo Contrast  08/08/2011  *RADIOLOGY REPORT*  Clinical Data: Unstable gait  CT HEAD WITHOUT CONTRAST  Technique:  Contiguous axial images were obtained from the base of the skull through the vertex without contrast.  Comparison: None.  Findings: No skull fracture is noted.   Paranasal sinuses and mastoid air cells are unremarkable.  No intracranial hemorrhage, mass effect or midline shift.  No definite acute cortical infarction.  No mass lesion is noted on this unenhanced scan.  No hydrocephalus.  No intra or extra-axial fluid collection.  IMPRESSION: No acute intracranial abnormality.  No definite acute cortical infarction.  Original Report Authenticated By: Natasha Mead, M.D.   Mr Brain Wo Contrast  08/08/2011  *RADIOLOGY REPORT*  Clinical Data: Balance problems.  Hypertension, smoker.  Rule out cerebellar stroke  MRI HEAD WITHOUT CONTRAST  Technique:  Multiplanar, multiecho pulse sequences of the brain and surrounding structures were obtained according to standard protocol without intravenous contrast.  Comparison: CT 08/08/2011  Findings: Negative for acute infarct.  Cerebellum and brainstem are normal.  Basilar artery is patent.  Scattered small white matter hyperintensities in the cerebral white matter bilaterally.  These may be due to chronic ischemia,  migraine headache, vasculitis, or less likely demyelinating disease.  Negative for hemorrhage or mass lesion.  Mild chronic sinusitis.  IMPRESSION: Negative for  acute infarct.  Small hyperintensities in the cerebral white matter bilaterally. These are most likely due to chronic ischemia however could be due to migraine headaches, vasculitis or less likely demyelinating disease.  Original Report Authenticated By: Camelia Phenes, M.D.     Date: 08/08/2011  Rate: 71  Rhythm: normal sinus rhythm  QRS Axis: normal  Intervals: normal  ST/T Wave abnormalities: normal  Conduction Disutrbances:none  Narrative Interpretation:   Old EKG Reviewed: none available    1. Gait disturbance   2. Hypokalemia       MDM   Patient presenting with gait abnormalities. With walking she falls to the left and is unable to walk in a straight line. She can balance on her right foot but is unable to balance on the left. She has normal  strength and sensation in all extremities and cranial nerves are intact. Patient states last night she had a left-sided posterior headache which has since resolved in the gait abnormality started this morning. She denies any pain in her back her legs that out of the ordinary and is otherwise in her normal state of health. She denies any recent medication changes and currently takes medication for blood pressure and high cholesterol. Symptoms are concerning for stroke versus other intracranial pathology. No symptoms suggestive of the lower motor involvement such as herniated disc or sciatica. CBC, BMP, coags, chest x-ray, EKG and head CT pending. Head CT is within normal limits though the patient needs an MRI .  10:19 AM Labs remarkable for hypokalemia which is most likely medication induced. Patient replaced with oral and IV potassium. Head CT was negative an MRI ordered  12:12 PM MR showed small hyperintensities in the cerebellar white matter bilaterally but nothing to explain her gait disturbance. Attempted to walk patient again and she is still falling to her left. Discussed the patient with neurology and they will admit her for further evaluation   Gwyneth Sprout, MD 08/08/11 1214  Gwyneth Sprout, MD 08/08/11 1214

## 2011-08-08 NOTE — ED Notes (Signed)
Notified dr plunkett of abnormal potassium level called to me by lab

## 2011-08-08 NOTE — H&P (Signed)
Admission H&P    Chief Complaint: TIA/Stroke HPI: [name] is an 51 y.o. female who woke up this am noting she was veering to the left when she was walking.  This continues after she had arrived at work thus she went to [facility] to be evaluated.  She currently is feeling better but still lists to the left when walking . MRI of brain has been obtained showing no acute infarct. No clear cut time of onset.  Patient was not on ASA prior to hospitalization. NIH 1.  Past Medical History  Diagnosis Date  . Arthritis   . Migraines   . Hypertension   . High cholesterol   . Colitis     Past Surgical History  Procedure Date  . Incontinence surgery     No family history on file. Social History:  reports that she has been smoking.  She does not have any smokeless tobacco history on file. She reports that she drinks alcohol. Her drug history not on file.  Allergies:  Allergies  Allergen Reactions  . Latex     REACTION: blisters    Medications Prior to Admission  Medication Dose Route Frequency Provider Last Rate Last Dose  . amLODipine (NORVASC) tablet 5 mg  5 mg Oral QHS Ulice Dash, PA      . aspirin suppository 300 mg  300 mg Rectal Daily Ulice Dash, PA       Or  . aspirin tablet 325 mg  325 mg Oral Daily Ulice Dash, PA      . enoxaparin (LOVENOX) injection 40 mg  40 mg Subcutaneous q1800 Ulice Dash, PA      . hydrochlorothiazide (MICROZIDE) capsule 12.5 mg  12.5 mg Oral QHS Elwin Sleight, PHARMD      . lisinopril (PRINIVIL,ZESTRIL) tablet 20 mg  20 mg Oral QHS Elwin Sleight, PHARMD      . potassium chloride 10 mEq in 100 mL IVPB  10 mEq Intravenous Q1 Hr x 2 Gwyneth Sprout, MD 100 mL/hr at 08/08/11 1152 10 mEq at 08/08/11 1152  . potassium chloride SA (K-DUR,KLOR-CON) CR tablet 40 mEq  40 mEq Oral Once Gwyneth Sprout, MD   40 mEq at 08/08/11 1133  . senna-docusate (Senokot-S) tablet 1 tablet  1 tablet Oral QHS PRN Ulice Dash, PA      .  DISCONTD: lisinopril-hydrochlorothiazide (PRINZIDE,ZESTORETIC) 20-12.5 MG per tablet 1 tablet  1 tablet Oral QHS Ulice Dash, PA       Medications Prior to Admission  Medication Sig Dispense Refill  . amLODipine (NORVASC) 5 MG tablet Take 5 mg by mouth at bedtime.      Marland Kitchen lisinopril-hydrochlorothiazide (PRINZIDE,ZESTORETIC) 20-12.5 MG per tablet Take 1 tablet by mouth at bedtime.        ROS: History obtained from the patient  General ROS: negative for - chills, fatigue, fever, night sweats, weight gain or weight loss Psychological ROS: negative for - behavioral disorder, hallucinations, memory difficulties, mood swings or suicidal ideation Ophthalmic ROS: negative for - blurry vision, double vision, eye pain or loss of vision ENT ROS: negative for - epistaxis, nasal discharge, oral lesions, sore throat, tinnitus or vertigo Allergy and Immunology ROS: negative for - hives or itchy/watery eyes Hematological and Lymphatic ROS: negative for - bleeding problems, bruising or swollen lymph nodes Endocrine ROS: negative for - galactorrhea, hair pattern changes, polydipsia/polyuria or temperature intolerance Respiratory ROS: negative for - cough, hemoptysis, shortness of breath or  wheezing Cardiovascular ROS: negative for - chest pain, dyspnea on exertion, edema or irregular heartbeat Gastrointestinal ROS: negative for - abdominal pain, diarrhea, hematemesis, nausea/vomiting or stool incontinence Genito-Urinary ROS: negative for - dysuria, hematuria, incontinence or urinary frequency/urgency Musculoskeletal ROS: negative for - joint swelling or muscular weakness Neurological ROS: as noted in HPI Dermatological ROS: negative for rash and skin lesion changes   Physical Examination: Blood pressure 142/91, pulse 85, temperature 98.2 F (36.8 C), temperature source Oral, resp. rate 20, SpO2 98.00%.  HEENT-  Normocephalic, no lesions, without obvious abnormality.  Normal external eye and conjunctiva.   Normal TM's bilaterally.  Normal auditory canals and external ears. Normal external nose, mucus membranes and septum.  Normal pharynx. Neck supple with no masses, nodes, nodules or enlargement. Cardiovascular - regular rate and rhythm, S1, S2 normal, no murmur, click, rub or gallop Lungs - chest clear, no wheezing, rales, normal symmetric air entry, Heart exam - S1, S2 normal, no murmur, no gallop, rate regular Abdomen - soft, non-tender; bowel sounds normal; no masses,  no organomegaly Extremities - less then 2 second capillary refill  Neurologic Examination: Mental Status: Alert, oriented, thought content appropriate.  Speech fluent without evidence of aphasia.  Able to follow 3 step commands without difficulty. Cranial Nerves: II: visual fields grossly normal, pupils equal, round, reactive to light and accommodation III,IV, VI: ptosis not present, extraocular muscles extra-ocular motions intact bilaterally V,VII: smile symmetric, facial light touch sensation normal bilaterally VIII: hearing normal bilaterally IX,X: gag reflex present XI: trapezius strength/neck flexion strength normal bilaterally XII: tongue strength normal  Motor: Right : Upper extremity    Left:     Upper extremity 5/5 deltoid       5/5 deltoid 5/5 tricep      5/5 tricep 5/5 biceps      5/5 biceps  5/5wrist flexion     5/5 wrist flexion 5/5 wrist extension     5/5 wrist extension 5/5 hand grip      5/5 hand grip  Lower extremity     Lower extremity 5/5 hip flexor      5/5 hip flexor 5/5 hip adductors     5/5 hip adductors 5/5 hip abductors     5/5 hip abductors 5/5 quadricep      5/5 quadriceps  5/5 hamstrings     5/5 hamstrings 5/5 plantar flexion       5/5 plantar flexion 5/5 plantar extension     5/5 plantar extension Tone and bulk:normal tone throughout; no atrophy noted Sensory: Pinprick and light touch intact throughout, bilaterally Deep Tendon Reflexes: 3+ and symmetric throughout Plantars: Right:  downgoing   Left: downgoing Cerebellar: normal finger-to-nose, normal rapid alternating movements and normal heel-to-shin test  gait shows patient listing to the left and difficulty standing on left foot alone  Results for orders placed during the hospital encounter of 08/08/11 (from the past 48 hour(s))  CBC     Status: Abnormal   Collection Time   08/08/11  9:30 AM      Component Value Range Comment   WBC 13.0 (*) 4.0 - 10.5 (K/uL) RESULT REPEATED AND VERIFIED   RBC 4.97  3.87 - 5.11 (MIL/uL)    Hemoglobin 15.2 (*) 12.0 - 15.0 (g/dL)    HCT 40.9  81.1 - 91.4 (%)    MCV 85.5  78.0 - 100.0 (fL)    MCH 30.6  26.0 - 34.0 (pg)    MCHC 35.8  30.0 - 36.0 (g/dL)  RDW 13.6  11.5 - 15.5 (%)    Platelets 271  150 - 400 (K/uL)   DIFFERENTIAL     Status: Abnormal   Collection Time   08/08/11  9:30 AM      Component Value Range Comment   Neutrophils Relative 66  43 - 77 (%)    Neutro Abs 8.5 (*) 1.7 - 7.7 (K/uL)    Lymphocytes Relative 23  12 - 46 (%)    Lymphs Abs 2.9  0.7 - 4.0 (K/uL)    Monocytes Relative 4  3 - 12 (%)    Monocytes Absolute 0.6  0.1 - 1.0 (K/uL)    Eosinophils Relative 7 (*) 0 - 5 (%)    Eosinophils Absolute 0.9 (*) 0.0 - 0.7 (K/uL)    Basophils Relative 1  0 - 1 (%)    Basophils Absolute 0.1  0.0 - 0.1 (K/uL)   BASIC METABOLIC PANEL     Status: Abnormal   Collection Time   08/08/11  9:30 AM      Component Value Range Comment   Sodium 137  135 - 145 (mEq/L)    Potassium 2.7 (*) 3.5 - 5.1 (mEq/L)    Chloride 99  96 - 112 (mEq/L)    CO2 26  19 - 32 (mEq/L)    Glucose, Bld 127 (*) 70 - 99 (mg/dL)    BUN 10  6 - 23 (mg/dL)    Creatinine, Ser 5.36  0.50 - 1.10 (mg/dL)    Calcium 9.0  8.4 - 10.5 (mg/dL)    GFR calc non Af Amer >90  >90 (mL/min)    GFR calc Af Amer >90  >90 (mL/min)   PROTIME-INR     Status: Normal   Collection Time   08/08/11  9:30 AM      Component Value Range Comment   Prothrombin Time 12.6  11.6 - 15.2 (seconds)    INR 0.92  0.00 - 1.49    APTT      Status: Normal   Collection Time   08/08/11  9:30 AM      Component Value Range Comment   aPTT 28  24 - 37 (seconds)    Dg Chest 2 View  08/08/2011  *RADIOLOGY REPORT*  Clinical Data: Unsteady balance.  Smoker, hypertension.  CHEST - 2 VIEW  Comparison: None.  Findings: Rounded nodular appearing area is noted just superior to the right hilum.  I favor this represents a prominent azygos vein rather than a true nodule.  Heart is borderline enlarged.  Slight peribronchial thickening.  No confluent airspace opacities or effusions.  No acute bony abnormality.  IMPRESSION: Slight bronchitic changes.  Borderline heart size.  Original Report Authenticated By: Cyndie Chime, M.D.   Ct Head Wo Contrast  08/08/2011  *RADIOLOGY REPORT*  Clinical Data: Unstable gait  CT HEAD WITHOUT CONTRAST  Technique:  Contiguous axial images were obtained from the base of the skull through the vertex without contrast.  Comparison: None.  Findings: No skull fracture is noted.  Paranasal sinuses and mastoid air cells are unremarkable.  No intracranial hemorrhage, mass effect or midline shift.  No definite acute cortical infarction.  No mass lesion is noted on this unenhanced scan.  No hydrocephalus.  No intra or extra-axial fluid collection.  IMPRESSION: No acute intracranial abnormality.  No definite acute cortical infarction.  Original Report Authenticated By: Natasha Mead, M.D.   Mr Brain Wo Contrast  08/08/2011  *RADIOLOGY REPORT*  Clinical Data: Balance problems.  Hypertension, smoker.  Rule out cerebellar stroke  MRI HEAD WITHOUT CONTRAST  Technique:  Multiplanar, multiecho pulse sequences of the brain and surrounding structures were obtained according to standard protocol without intravenous contrast.  Comparison: CT 08/08/2011  Findings: Negative for acute infarct.  Cerebellum and brainstem are normal.  Basilar artery is patent.  Scattered small white matter hyperintensities in the cerebral white matter bilaterally.  These may be  due to chronic ischemia,  migraine headache, vasculitis, or less likely demyelinating disease.  Negative for hemorrhage or mass lesion.  Mild chronic sinusitis.  IMPRESSION: Negative for acute infarct.  Small hyperintensities in the cerebral white matter bilaterally. These are most likely due to chronic ischemia however could be due to migraine headaches, vasculitis or less likely demyelinating disease.  Original Report Authenticated By: Camelia Phenes, M.D.    Assessment/Plan  51 YO female with new onset imbalance and listing to left when walking. MRI head neg, CT head neg.    Will Admit to hospital: Stroke Risk Factors - hypertension  Plan: 1. HgbA1c, fasting lipid panel 2. MRA  of the brain without contrast 3. PT consult, OT consult, Speech consult 4. Echocardiogram 5. Carotid dopplers 6. Prophylactic therapy-Antiplatelet med: Aspirin - dose  325 daily 7. Risk  Factor modification 8. Telemetry 9. Social work consult to help with insurance 10. Neurochecks q4h 11. Vitals q4h 12. Ambulate  Carmell Austria, MD

## 2011-08-09 ENCOUNTER — Inpatient Hospital Stay (HOSPITAL_COMMUNITY): Payer: Self-pay

## 2011-08-09 DIAGNOSIS — G459 Transient cerebral ischemic attack, unspecified: Secondary | ICD-10-CM | POA: Diagnosis present

## 2011-08-09 DIAGNOSIS — E785 Hyperlipidemia, unspecified: Secondary | ICD-10-CM | POA: Diagnosis present

## 2011-08-09 DIAGNOSIS — E860 Dehydration: Secondary | ICD-10-CM | POA: Diagnosis present

## 2011-08-09 DIAGNOSIS — I6789 Other cerebrovascular disease: Secondary | ICD-10-CM

## 2011-08-09 DIAGNOSIS — I10 Essential (primary) hypertension: Secondary | ICD-10-CM | POA: Diagnosis present

## 2011-08-09 DIAGNOSIS — K52832 Lymphocytic colitis: Secondary | ICD-10-CM | POA: Diagnosis present

## 2011-08-09 LAB — LIPID PANEL
Cholesterol: 248 mg/dL — ABNORMAL HIGH (ref 0–200)
HDL: 34 mg/dL — ABNORMAL LOW (ref >39)
Total CHOL/HDL Ratio: 7.3 RATIO
Triglycerides: 349 mg/dL — ABNORMAL HIGH (ref <150)

## 2011-08-09 LAB — HEMOGLOBIN A1C: Mean Plasma Glucose: 123 mg/dL — ABNORMAL HIGH (ref <117)

## 2011-08-09 MED ORDER — SIMVASTATIN 20 MG PO TABS: 20.0000 mg | ORAL_TABLET | Freq: Every day | ORAL | Status: DC

## 2011-08-09 MED ORDER — SIMVASTATIN 20 MG PO TABS
20.0000 mg | ORAL_TABLET | Freq: Every day | ORAL | Status: DC
  Filled 2011-08-09: qty 1

## 2011-08-09 NOTE — Discharge Instructions (Signed)
STROKE/TIA DISCHARGE INSTRUCTIONS SMOKING Cigarette smoking nearly doubles your risk of having a stroke & is the single most alterable risk factor  If you smoke or have smoked in the last 12 months, you are advised to quit smoking for your health.  Most of the excess cardiovascular risk related to smoking disappears within a year of stopping.  Ask you doctor about anti-smoking medications  [place] Quit Line: 1-800-QUIT NOW  Free Smoking Cessation Classes [phone]  CHOLESTEROL Know your levels; limit fat & cholesterol in your diet  Lipid Panel     Component Value Date/Time   CHOL 248* 08/09/2011 0625   TRIG 349* 08/09/2011 0625   HDL 34* 08/09/2011 0625   CHOLHDL 7.3 08/09/2011 0625   VLDL 70* 08/09/2011 0625   LDLCALC 144* 08/09/2011 0625      Many patients benefit from treatment even if their cholesterol is at goal.  Goal: Total Cholesterol (CHOL) less than 160  Goal:  Triglycerides (TRIG) less than 150  Goal:  HDL greater than 40  Goal:  LDL (LDLCALC) less than 100   BLOOD PRESSURE American Stroke Association blood pressure target is less that 120/80 mm/Hg  Your discharge blood pressure is:  BP: 127/86 mmHg  Monitor your blood pressure  Limit your salt and alcohol intake  Many individuals will require more than one medication for high blood pressure  DIABETES (A1c is a blood sugar average for last 3 months) Goal HGBA1c is under 7% (HBGA1c is blood sugar average for last 3 months)  Diabetes: No known diagnosis of diabetes    No results found for this basename: HGBA1C     Your HGBA1c can be lowered with medications, healthy diet, and exercise.  Check your blood sugar as directed by your physician  Call your physician if you experience unexplained or low blood sugars.  PHYSICAL ACTIVITY/REHABILITATION Goal is 30 minutes at least 4 days per week    Activity:   No restrictions. Return to work:  as able  Activity decreases your risk of heart attack and stroke and makes your  heart stronger.  It helps control your weight and blood pressure; helps you relax and can improve your mood.  Participate in a regular exercise program.  Talk with your doctor about the best form of exercise for you (dancing, walking, swimming, cycling).  DIET/WEIGHT Goal is to maintain a healthy weight  Your discharge diet is: Cardiac thin liquids Your height is:  Height: 5\' 8"  (172.7 cm) Your current weight is: Weight: 91.173 kg (201 lb) Your Body Mass Index (BMI) is:  BMI (Calculated): 30.6   Following the type of diet specifically designed for you will help prevent another stroke.  Your goal weight range is:  ***  Your goal Body Mass Index (BMI) is 19-24.  Healthy food habits can help reduce 3 risk factors for stroke:  High cholesterol, hypertension, and excess weight.  RESOURCES Stroke/Support Group:  Call [phone]  they meet the 3rd Sunday of the month on the Rehab Unit at [facility], [place] ( no meetings June, July & Aug).  STROKE EDUCATION PROVIDED/REVIEWED AND GIVEN TO PATIENT  ** follow up right carotid stenosis in 6 mos ** stay hydrated Stroke warning signs and symptoms How to activate emergency medical system (call 911). Medications prescribed at discharge. Need for follow-up after discharge. Personal risk factors for stroke. Pneumonia vaccine given:   {STROKE DC YES/NO/DATE:22363} Flu vaccine given:   {STROKE DC YES/NO/DATE:22363} My questions have been answered, the writing is legible,  and I understand these instructions.  I will adhere to these goals & educational materials that have been provided to me after my discharge from the hospital.

## 2011-08-09 NOTE — Discharge Summary (Signed)
Stroke Discharge Summary  Patient ID: [name]   MRN: [id number]      DOB: [birth year]  Date of Admission: 08/08/2011 Date of Discharge: 08/09/2011  Attending Physician:  Darcella Cheshire, MD  Consulting Physician(s):    none  Patient's PCP:  No primary provider on file.  Discharge Diagnoses:  Principal Problem:  *TIA (transient ischemic attack) Active Problems:  Dehydration  Hypertension  Hyperlipidemia LDL goal < 100  Colitis  BMI: Body mass index is 30.56 kg/(m^2).  Past Medical History  Diagnosis Date  . Arthritis   . Migraines   . Hypertension   . High cholesterol   . Colitis    Past Surgical History  Procedure Date  . Incontinence surgery    Medication List  As of 08/09/2011  1:09 PM   ASK your doctor about these medications         amLODipine 5 MG tablet   Commonly known as: NORVASC   Take 5 mg by mouth at bedtime.      aspirin-acetaminophen-caffeine 250-250-65 MG per tablet   Commonly known as: EXCEDRIN MIGRAINE   Take 1 tablet by mouth every 6 (six) hours as needed. For headache      ibuprofen 200 MG tablet   Commonly known as: ADVIL,MOTRIN   Take 200 mg by mouth every 8 (eight) hours as needed. For pain      lisinopril-hydrochlorothiazide 20-12.5 MG per tablet   Commonly known as: PRINZIDE,ZESTORETIC   Take 1 tablet by mouth at bedtime.           LABORATORY STUDIES CBC    Component Value Date/Time   WBC 16.2* 08/08/2011 1552   RBC 4.81 08/08/2011 1552   HGB 14.7 08/08/2011 1552   HCT 41.8 08/08/2011 1552   PLT 276 08/08/2011 1552   MCV 86.9 08/08/2011 1552   MCH 30.6 08/08/2011 1552   MCHC 35.2 08/08/2011 1552   RDW 13.4 08/08/2011 1552   LYMPHSABS 2.9 08/08/2011 0930   MONOABS 0.6 08/08/2011 0930   EOSABS 0.9* 08/08/2011 0930   BASOSABS 0.1 08/08/2011 0930   CMP    Component Value Date/Time   NA 137 08/08/2011 0930   K 3.5 08/08/2011 2259   CL 99 08/08/2011 0930   CO2 26 08/08/2011 0930   GLUCOSE 127* 08/08/2011 0930   BUN 10 08/08/2011 0930   CREATININE 0.62 08/08/2011 1552   CALCIUM 9.0 08/08/2011 0930   GFRNONAA >90 08/08/2011 1552   GFRAA >90 08/08/2011 1552   COAGS Lab Results  Component Value Date   INR 0.92 08/08/2011   Lipid Panel    Component Value Date/Time   CHOL 248* 08/09/2011 0625   TRIG 349* 08/09/2011 0625   HDL 34* 08/09/2011 0625   CHOLHDL 7.3 08/09/2011 0625   VLDL 70* 08/09/2011 0625   LDLCALC 144* 08/09/2011 0625   HgbA1C  No results found for this basename: HGBA1C   Urine Drug Screen  No results found for this basename: labopia,  cocainscrnur,  labbenz,  amphetmu,  thcu,  labbarb    Alcohol Level No results found for this basename: eth    SIGNIFICANT DIAGNOSTIC STUDIES CT of the brain 08/08/2011 No acute intracranial abnormality. No definite acute cortical infarction.  MRI of the brain 08/08/2011 Negative for acute infarct. Small hyperintensities in the cerebral white matter bilaterally. These are most likely due to chronic ischemia however could be due to migraine headaches, vasculitis or less likely demyelinating disease.  MRA of the brain  08/09/2011   Negative  2D Echocardiogram performed, results pending Carotid Doppler Right: 60-79% proximal internal carotid artery stenosis in the bulb low end of range. Vertebral artery flow is antegrade. Left: No evidence of hemodynamically significant internal carotid artery stenosis. Vertebral artery flow is antegrade.  CXR 08/08/2011 Slight bronchitic changes. Borderline heart size.  EKG normal sinus rhythm.     History of Present Illness  [name] is an 51 y.o. female who woke up 08/08/2011 noting she was veering to the left when she was walking. This continues after she had arrived at work thus [facility]. She currently is feeling better but still lists to the left when walking. MRI of brain has been obtained showing no acute infarct. No clear cut time of onset. Patient was not on ASA prior to hospitalization. NIH 1. Patient was not  a TPA candidate secondary to mild symptoms. she was admitted for further evaluation and treatment.   Hospital Course as above, MRI was negative for acute stroke. Due to colitis, pt reports dehydration yesterday. Dehydration likely led to current symptoms of unsteady gait. Patient has been advised to take ASA 81 mg daily at discharge.  Patient has stroke risk factors of carotid stenosis, hyperlipidemia, smoking and obesity.. She does not have insurance. She states she cannot afford her colitis medications; she is working on Advice worker. She has been advised to stay hydrated.  Patient with no continued stroke symptoms. Physical therapy, occupational therapy and speech therapy evaluated patient. They recommend no therapy followup  Discharge Exam  Blood pressure 127/86, pulse 73, temperature 97.9 F (36.6 C), temperature source Oral, resp. rate 18, height 5\' 8"  (1.727 m), weight 91.173 kg (201 lb), SpO2 96.00%. Patient alert and oriented x 3. Speech clear. No aphasia. No dysarthria. Extraoccular movements intact. Visual fields full. Face symmetric. Tongue midline. Moves all extremities x 4. Strength normal. Coordination normal. Sensation intact. Heart rate regular. Breath sounds clear.  Discharge Diet   Cardiac thin liquids  Discharge Plan   - Disposition:  Home with family - aspirin 81 mg orally every day for secondary stroke prevention. - Ongoing risk factor control by Primary Care Physician. - Risk factor recommendations:  Lipid range - LDL < 100 and checked every 6 months, fasting Smoking cessation weight loss  -follow up 2 D echo results -follow in right ICA stenosis in 6 mos - Follow-up primary provider in 1 week. - Follow-up with Dr. Delia Heady in 2 months.  Signed Annie Main, AVNP, ANP-BC, [facility] Nurse Practitioner 08/09/2011, 1:09 PM  Dr. Delia Heady, Stroke Center Medical Director, has personally reviewed chart, pertinent data, examined the patient  and developed the plan of care.

## 2011-08-09 NOTE — Evaluation (Signed)
Physical Therapy Evaluation Patient Details Name: [name] MRN: [id number] DOB: [birth year] Today's Date: 08/09/2011  Problem List:  Patient Active Problem List  Diagnoses  . ANXIETY  . DEPRESSION  . HYPERTENSION  . OTH&UNSPEC NONINFECTIOUS GASTROENTERITIS&COLITIS  . FLATULENCE-GAS-BLOATING  . DIARRHEA  . ABDOMINAL PAIN, GENERALIZED    Past Medical History:  Past Medical History  Diagnosis Date  . Arthritis   . Migraines   . Hypertension   . High cholesterol   . Colitis    Past Surgical History:  Past Surgical History  Procedure Date  . Incontinence surgery     PT Assessment/Plan/Recommendation PT Assessment Clinical Impression Statement: Patient presented to ED with gait imbalance and NIHSS1. She presents today with high level balance deficits that decrease her safety with gait, but that have significantly improved since yesterday. I have recommended outpatient PT however patient declines therfore I have provided her with a home exercise program. We will follow acutely to progress gait and balance activities to decr. fall risk PT Recommendation/Assessment: Patient will need skilled PT in the acute care venue PT Problem List: Decreased balance;Decreased mobility PT Therapy Diagnosis : Abnormality of gait;Difficulty walking PT Plan PT Frequency: Min 4X/week PT Treatment/Interventions: Gait training;Balance training;Patient/family education;Therapeutic exercise;Therapeutic activities PT Recommendation Follow Up Recommendations: No PT follow up (will do home exercise program at patients request) Equipment Recommended: None recommended by PT PT Goals  Acute Rehab PT Goals PT Goal Formulation: With patient Time For Goal Achievement: 7 days Pt will Ambulate: >150 feet;Independently PT Goal: Ambulate - Progress: Goal set today Pt will Perform Home Exercise Program: Independently (for balance as per handout) PT Goal: Perform Home Exercise Program - Progress: Goal set  today Additional Goals Additional Goal #1: Patient will increase dynamic gait index score to at least 23 to inidicate safe ambulation PT Goal: Additional Goal #1 - Progress: Goal set today  PT Evaluation Precautions/Restrictions  Precautions Precautions: Fall Prior Functioning  Home Living Lives With: Daughter Type of Home: House Home Layout: One level Home Access: Stairs to enter [employer] of Steps: 2 Home Adaptive Equipment: None Prior Function Level of Independence: Independent with basic ADLs;Independent with homemaking with ambulation Driving: Yes 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 Arousal/Alertness: Awake/alert Overall Cognitive Status: Appears within functional limits for tasks assessed Orientation Level: Oriented X4 Sensation/Coordination Sensation Light Touch: Appears Intact Proprioception: Appears Intact Coordination Gross Motor Movements are Fluid and Coordinated: Yes Fine Motor Movements are Fluid and Coordinated: Yes Smooth pursuits - WFL, saccades - WFL, VOR - Negative, Dix hallpike - negative Extremity Assessment RLE Assessment RLE Assessment: Within Functional Limits LLE Assessment LLE Assessment: Within Functional Limits Mobility (including Balance) Bed Mobility Bed Mobility: Yes Supine to Sit: 7: Independent Sitting - Scoot to Edge of Bed: 7: Independent Sit to Supine: 7: Independent Transfers Transfers: Yes Sit to Stand: 7: Independent;From bed;From chair/3-in-1;From toilet Stand to Sit: 7: Independent;To toilet;To chair/3-in-1;To bed Ambulation/Gait Ambulation/Gait: Yes Ambulation/Gait Assistance: 5: Supervision Ambulation/Gait Assistance Details (indicate cue type and reason): Intermittent increased left stance/delayed postural reactions leading to mild veer left, but not consistent throughout gait - 3-4 steps max. Ambulation Distance (Feet): 500 Feet Gait Pattern: Within Functional  Limits Stairs: Yes Stairs Assistance: 5: Supervision Stairs Assistance Details (indicate cue type and reason): For safety secondary to mild increased sway left with descending - would recommend use of rail or step to pattern for safety. Stair Management Technique: Forwards;No rails;Alternating pattern Number of Stairs: 2   Posture/Postural Control Posture/Postural Control:  Postural limitations Postural Limitations: Delayed postural reactions with increased weight shift to left Balance Balance Assessed: Yes Static Standing Balance Single Leg Stance - Right Leg: 15  Single Leg Stance - Left Leg: 4  Tandem Stance - Right Leg: 5  Tandem Stance - Left Leg: 5  Rhomberg - Eyes Opened: 10  Rhomberg - Eyes Closed: 60  Dynamic Gait Index Level Surface: Mild Impairment Change in Gait Speed: Normal Gait with Horizontal Head Turns: Mild Impairment Gait with Vertical Head Turns: Mild Impairment Gait and Pivot Turn: Mild Impairment Step Over Obstacle: Mild Impairment Step Around Obstacles: Normal Steps: Normal Total Score: 19  High Level Balance High Level Balance Activites: Side stepping;Backward walking;Direction changes;Turns;Head turns;Sudden stops High Level Balance Comments: Also worked on home exercise program for walking and then 180 degree turns to either the left or right to create an oval pattern Exercise  Other Exercises Other Exercises: Educatd in home exercise program for balance as patient does not want to attend outpatient therapy - romberg, semitandem, tandem, signle limb stance all with time targets and progression to uneven surfaces/eyes closed. Also eductaed on safety with exercise performance - i.e: with supervision and in corner of the room. End of Session PT - End of Session Equipment Utilized During Treatment: Gait belt Activity Tolerance: Patient tolerated treatment well Patient left: in bed;with call bell in reach Nurse Communication: Mobility status for  ambulation General Behavior During Session: [facility] for tasks performed Cognition: [facility] for tasks performed  Edwyna Perfect, PT  Pager [phone]  08/09/2011, 11:38 AM

## 2011-08-09 NOTE — Progress Notes (Signed)
Patient D/C instructions and stroke education provided to patient. [facility]" Stroke manual given and reviewed.  NO further questions addressed. Patient D/C to home with no signs of acute distress.

## 2011-08-09 NOTE — Progress Notes (Signed)
Stroke Team Progress Note  HISTORY [name] is an 51 y.o. female who woke up this am noting she was veering to the left when she was walking. This continues after she had arrived at work thus she went to [facility] to be evaluated. She currently is feeling better but still lists to the left when walking . MRI of brain has been obtained showing no acute infarct. No clear cut time of onset. Patient was not on ASA prior to hospitalization. NIH 1.   SUBJECTIVE Three girls are at the bedside. Overall she feels her condition is completely resolved. She is upset that she is unable to walk independently due to MD orders. She is anxious to go home. States she does not have insurance and cannot afford her medications. She is looking into [employer] that may cover her.  OBJECTIVE Filed Vitals:   08/08/11 2125 08/09/11 0300 08/09/11 0549 08/09/11 1100  BP: 118/82 110/72 102/68 127/86  Pulse: 77 89 69 73  Temp: 98 F (36.7 C) 98 F (36.7 C) 97.8 F (36.6 C) 97.9 F (36.6 C)  TempSrc: Oral Oral Oral Oral  Resp: 20 17 17 18   Height:      Weight:      SpO2: 96% 94% 97% 96%   CBG (last 3) No results found for this basename: GLUCAP:3 in the last 72 hours Intake/Output from previous day: 04/02 0701 - 04/03 0700 In: 200 [P.O.:200] Out: -   IV Fluid Intake:    Medications    . amLODipine  5 mg Oral QHS  . aspirin  300 mg Rectal Daily   Or  . aspirin  325 mg Oral Daily  . enoxaparin  40 mg Subcutaneous q1800  . hydrochlorothiazide  12.5 mg Oral QHS  . lisinopril  20 mg Oral QHS  . oxyCODONE  5 mg Oral Once  . potassium chloride  10 mEq Intravenous Q1 Hr x 2  . potassium chloride  10 mEq Intravenous Q1 Hr x 2  . potassium chloride  40 mEq Oral Once  . potassium chloride  40 mEq Oral BID  . DISCONTD: lisinopril-hydrochlorothiazide  1 tablet Oral QHS  PRN senna-docusate  Diet:  Cardiac thin liquids Activity:  Bathroom privileges DVT Prophylaxis:  Lovenox 40 mg sq daily     Significant Diagnostic Studies: CBC    Component Value Date/Time   WBC 16.2* 08/08/2011 1552   RBC 4.81 08/08/2011 1552   HGB 14.7 08/08/2011 1552   HCT 41.8 08/08/2011 1552   PLT 276 08/08/2011 1552   MCV 86.9 08/08/2011 1552   MCH 30.6 08/08/2011 1552   MCHC 35.2 08/08/2011 1552   RDW 13.4 08/08/2011 1552   LYMPHSABS 2.9 08/08/2011 0930   MONOABS 0.6 08/08/2011 0930   EOSABS 0.9* 08/08/2011 0930   BASOSABS 0.1 08/08/2011 0930   CMP    Component Value Date/Time   NA 137 08/08/2011 0930   K 3.5 08/08/2011 2259   CL 99 08/08/2011 0930   CO2 26 08/08/2011 0930   GLUCOSE 127* 08/08/2011 0930   BUN 10 08/08/2011 0930   CREATININE 0.62 08/08/2011 1552   CALCIUM 9.0 08/08/2011 0930   GFRNONAA >90 08/08/2011 1552   GFRAA >90 08/08/2011 1552   COAGS Lab Results  Component Value Date   INR 0.92 08/08/2011   Lipid Panel    Component Value Date/Time   CHOL 248* 08/09/2011 0625   TRIG 349* 08/09/2011 0625   HDL 34* 08/09/2011 0625   CHOLHDL  7.3 08/09/2011 0625   VLDL 70* 08/09/2011 0625   LDLCALC 144* 08/09/2011 0625   HgbA1C  No results found for this basename: HGBA1C   Urine Drug Screen  No results found for this basename: labopia,  cocainscrnur,  labbenz,  amphetmu,  thcu,  labbarb    Alcohol Level No results found for this basename: eth   CT of the brain  08/08/2011  No acute intracranial abnormality.  No definite acute cortical infarction.   MRI of the brain  08/08/2011  Negative for acute infarct.  Small hyperintensities in the cerebral white matter bilaterally. These are most likely due to chronic ischemia however could be due to migraine headaches, vasculitis or less likely demyelinating disease.  MRA of the brain  ordered   2D Echocardiogram  performed  Carotid Doppler  Right: 60-79% proximal internal carotid artery stenosis in the bulb low end of range. Vertebral artery flow is antegrade. Left: No evidence of hemodynamically significant internal carotid artery stenosis. Vertebral artery flow is antegrade.   CXR   08/08/2011 Slight bronchitic changes.  Borderline heart size.    EKG  normal sinus rhythm.   Physical Exam   Awake alert. Afebrile. Head is nontraumatic. Neck is supple without bruit. Hearing is normal. Cardiac exam no murmur or gallop. Lungs are clear to auscultation. Distal pulses are well felt.  Neurological Exam ;Awake  Alert oriented x 3. Normal speech and language.eye movements full without nystagmus. Face symmetric. Tongue midline. Normal strength, tone, reflexes and coordination. Normal sensation.Mild difficulty with tandem walking.   ASSESSMENT [name]. [name] is a 51 y.o. female with a unsteady gait, secondary to TIA, likely from reported dehydration yesterday per pt. On no antiplatelets prior admission. Now on aspirin 325 mg orally every day for secondary stroke prevention. Patient with resultant balance disturbance.  -migraines -hypertension -right carotid stenosis 60-79%, low end of the scale. Likely an incidental finding -hyperlipidemia -colitis -hypokalemia  Hospital day # 1  TREATMENT/PLAN - Continue aspirin 325 mg orally every day for secondary stroke prevention. - add zocor 20 mg daily -stay hydrated -MRA today -follow carotid stenosis as an OP -plan discharge home after MRA completed  SHARON BIBY, AVNP, ANP-BC, GNP-BC [facility] Pager: [phone] 08/09/2011 11:11 AM  Dr. Delia Heady, Stroke Center Medical Director, has personally reviewed chart, pertinent data, examined the patient and developed the plan of care.

## 2011-08-09 NOTE — Progress Notes (Signed)
VASCULAR LAB PRELIMINARY  PRELIMINARY  PRELIMINARY  PRELIMINARY  Carotid duplex completed.    Preliminary report:  Right:  60-79% proximal  internal carotid artery stenosis in the bulb low end of range. Vertebral artery flow is antegrade. Left:  No evidence of hemodynamically significant internal carotid artery stenosis.  Vertebral artery flow is antegrade.  Lynzi Meulemans D, RVS 08/09/2011, 9:48 AM

## 2011-08-09 NOTE — Progress Notes (Signed)
CARE MANAGEMENT NOTE 08/09/2011  Patient:  [name], [name]   Account Number:  [id number]  Date Initiated:  08/09/2011  Documentation initiated by:  Vance Peper  Subjective/Objective Assessment:   51 yr old female adm for ? TIA     Action/Plan:   Spoke with patient regarding medications. States she cant afford her colitis meds, trying to get insurance. Not receptive to input. Has script for Zocor, states she will get filled at [facility]   Anticipated DC Date:  08/09/2011   Anticipated DC Plan:  HOME/SELF CARE  In-house referral  Financial Counselor      DC Planning Services  Medication Assistance      [facility] Choice  NA   Choice offered to / List presented to:  NA           Status of service:  Completed, signed off   Discharge Disposition:  HOME/SELF CARE

## 2011-08-09 NOTE — Progress Notes (Signed)
Patient refused to keep bed alarm on.  Patient continues to have Left gait preference.

## 2011-08-09 NOTE — Progress Notes (Signed)
Patient continues to refuse bed alarm and safety measures. Educated on importance of allowing staff to help her when transferring and having the bed alarm on. Patient states. "I am ok" and "I do not need help". Will continue to monitor.

## 2011-08-09 NOTE — Progress Notes (Signed)
  Echocardiogram 2D Echocardiogram has been performed.  [name] 08/09/2011, 9:22 AM

## 2011-08-10 ENCOUNTER — Telehealth: Payer: Self-pay | Admitting: Gastroenterology

## 2011-08-10 NOTE — Telephone Encounter (Signed)
Patient reports she has a flare of her lymphocytic colitis and is asking for a cheaper alternative to Entocort.  She has not been seen in the office since 2011.  She is asked to make an appt, she declines because she does not have insurance.  She is advised that there is not a cheaper alternative to Entocort.  She says when her Medicaid becomes effective she will schedule an office visit.

## 2011-08-10 NOTE — Telephone Encounter (Signed)
Patient will call back once her Medicaid is active, generic is about the same price

## 2011-08-10 NOTE — Telephone Encounter (Signed)
I recommend an office visit to evaluate her. Generic budesonide is available, not sure how much less expensive it is compared to Entocort.

## 2011-08-13 ENCOUNTER — Emergency Department (HOSPITAL_COMMUNITY): Payer: Self-pay

## 2011-08-13 ENCOUNTER — Encounter (HOSPITAL_COMMUNITY): Payer: Self-pay | Admitting: Emergency Medicine

## 2011-08-13 ENCOUNTER — Emergency Department (HOSPITAL_COMMUNITY)
Admission: EM | Admit: 2011-08-13 | Discharge: 2011-08-14 | Disposition: A | Discharge status: Home / Self Care | Payer: Self-pay | Attending: Emergency Medicine | Admitting: Emergency Medicine

## 2011-08-13 DIAGNOSIS — Z8673 Personal history of transient ischemic attack (TIA), and cerebral infarction without residual deficits: Secondary | ICD-10-CM | POA: Insufficient documentation

## 2011-08-13 DIAGNOSIS — Z79899 Other long term (current) drug therapy: Secondary | ICD-10-CM | POA: Insufficient documentation

## 2011-08-13 DIAGNOSIS — H11419 Vascular abnormalities of conjunctiva, unspecified eye: Secondary | ICD-10-CM | POA: Insufficient documentation

## 2011-08-13 DIAGNOSIS — J329 Chronic sinusitis, unspecified: Secondary | ICD-10-CM | POA: Insufficient documentation

## 2011-08-13 DIAGNOSIS — R262 Difficulty in walking, not elsewhere classified: Secondary | ICD-10-CM | POA: Insufficient documentation

## 2011-08-13 DIAGNOSIS — E7800 Pure hypercholesterolemia, unspecified: Secondary | ICD-10-CM | POA: Insufficient documentation

## 2011-08-13 DIAGNOSIS — R209 Unspecified disturbances of skin sensation: Secondary | ICD-10-CM | POA: Insufficient documentation

## 2011-08-13 DIAGNOSIS — R269 Unspecified abnormalities of gait and mobility: Secondary | ICD-10-CM | POA: Insufficient documentation

## 2011-08-13 DIAGNOSIS — M129 Arthropathy, unspecified: Secondary | ICD-10-CM | POA: Insufficient documentation

## 2011-08-13 DIAGNOSIS — I10 Essential (primary) hypertension: Secondary | ICD-10-CM | POA: Insufficient documentation

## 2011-08-13 DIAGNOSIS — R5381 Other malaise: Secondary | ICD-10-CM | POA: Insufficient documentation

## 2011-08-13 LAB — CBC
MCH: 30.6 pg (ref 26.0–34.0)
Platelets: 317 10*3/uL (ref 150–400)
RBC: 5.2 MIL/uL — ABNORMAL HIGH (ref 3.87–5.11)

## 2011-08-13 LAB — DIFFERENTIAL
Basophils Absolute: 0.1 10*3/uL (ref 0.0–0.1)
Basophils Relative: 1 % (ref 0–1)
Eosinophils Absolute: 1 10*3/uL — ABNORMAL HIGH (ref 0.0–0.7)
Lymphs Abs: 4.8 10*3/uL — ABNORMAL HIGH (ref 0.7–4.0)
Neutrophils Relative %: 59 % (ref 43–77)

## 2011-08-13 MED ORDER — SODIUM CHLORIDE 0.9 % IV BOLUS (SEPSIS): 1000.0000 mL | Freq: Once | INTRAVENOUS | Status: DC

## 2011-08-13 NOTE — ED Notes (Addendum)
Pt stated that the left side of her face is still tingling but the cold sensation has subsided.

## 2011-08-13 NOTE — ED Provider Notes (Addendum)
History     CSN: [id number]  Arrival date & time 08/13/11  2043   First MD Initiated Contact with Patient 08/13/11 2313      Chief Complaint  Patient presents with  . Weakness    (Consider location/radiation/quality/duration/timing/severity/associated sxs/prior treatment) Patient is a 51 y.o. female presenting with weakness. The history is provided by the patient.  Weakness The primary symptoms include paresthesias. Primary symptoms do not include loss of consciousness, visual change, focal weakness, nausea or vomiting. Primary symptoms comment: Difficulty walking. It seems to be or to the left but she denies feeling off balance. Episode onset: All day today. The episode lasted 8 hours. The symptoms are unchanged. The neurological symptoms are focal. Context: When she woke up today her gait has been more difficult than yesterday.  Additional symptoms include weakness. Additional symptoms do not include neck stiffness, leg pain, loss of balance, photophobia or hearing loss. Medical issues do not include cerebral vascular accident. Workup history includes MRI and CT scan.    Past Medical History  Diagnosis Date  . Arthritis   . Migraines   . Hypertension   . High cholesterol   . Colitis   . TIA (transient ischemic attack)     Past Surgical History  Procedure Date  . Incontinence surgery     No family history on file.  History  Substance Use Topics  . Smoking status: Current Everyday Smoker  . Smokeless tobacco: Not on file  . Alcohol Use: Yes     minimal    OB History    Grav Para Term Preterm Abortions TAB SAB Ect Mult Living                  Review of Systems  HENT: Negative for hearing loss and neck stiffness.   Eyes: Negative for photophobia.  Gastrointestinal: Negative for nausea and vomiting.  Neurological: Positive for weakness and paresthesias. Negative for focal weakness, loss of consciousness and loss of balance.  All other systems reviewed and are  negative.    Allergies  Latex  Home Medications   Current Outpatient Rx  Name Route Sig Dispense Refill  . AMLODIPINE BESYLATE 5 MG PO TABS Oral Take 5 mg by mouth at bedtime.    . ASPIRIN-ACETAMINOPHEN-CAFFEINE 250-250-65 MG PO TABS Oral Take 1 tablet by mouth every 6 (six) hours as needed. For headache    . IBUPROFEN 200 MG PO TABS Oral Take 200 mg by mouth every 8 (eight) hours as needed. For pain    . LISINOPRIL-HYDROCHLOROTHIAZIDE 20-12.5 MG PO TABS Oral Take 1 tablet by mouth at bedtime.    Marland Kitchen POTASSIUM GLUCONATE 595 MG PO CAPS Oral Take 1-4 capsules by mouth daily as needed. For potassium level    . SIMVASTATIN 20 MG PO TABS Oral Take 1 tablet (20 mg total) by mouth daily at 6 PM. 30 tablet 2    BP 124/98  Pulse 96  Temp(Src) 98.9 F (37.2 C) (Oral)  Resp 18  SpO2 94%  Physical Exam  Nursing note and vitals reviewed. Constitutional: She is oriented to person, place, and time. She appears well-developed and well-nourished. She appears distressed.  HENT:  Head: Normocephalic and atraumatic.  Right Ear: Tympanic membrane and ear canal normal.  Left Ear: Tympanic membrane and ear canal normal.  Nose: Mucosal edema present.  Eyes: EOM are normal. Pupils are equal, round, and reactive to light. Left conjunctiva is injected. Left conjunctiva has no hemorrhage.  Cardiovascular: Normal rate, regular rhythm, normal  heart sounds and intact distal pulses.  Exam reveals no friction rub.   No murmur heard. Pulmonary/Chest: Effort normal and breath sounds normal. She has no wheezes. She has no rales.  Abdominal: Soft. Bowel sounds are normal. She exhibits no distension. There is no tenderness. There is no rebound and no guarding.  Musculoskeletal: Normal range of motion. She exhibits no tenderness.       No edema  Neurological: She is alert and oriented to person, place, and time. She has normal strength. No cranial nerve deficit. She displays a negative Romberg sign. Gait abnormal.  Coordination normal.       With gait she veers to the left. But has normal coordination with sitting. Negative Romberg, normal heel-to-shin and finger to nose.  Subjective mild decreased sensation over the left side of the face  Skin: Skin is warm and dry. No rash noted.  Psychiatric: She has a normal mood and affect. Her behavior is normal.    ED Course  Procedures (including critical care time)  Labs Reviewed  BASIC METABOLIC PANEL - Abnormal; Notable for the following:    Glucose, Bld 103 (*)    All other components within normal limits  CBC - Abnormal; Notable for the following:    WBC 16.5 (*)    RBC 5.20 (*)    Hemoglobin 15.9 (*)    HCT 46.2 (*)    All other components within normal limits  DIFFERENTIAL - Abnormal; Notable for the following:    Neutro Abs 9.7 (*)    Lymphs Abs 4.8 (*)    Eosinophils Relative 6 (*)    Eosinophils Absolute 1.0 (*)    All other components within normal limits   Ct Head Wo Contrast  08/14/2011  *RADIOLOGY REPORT*  Clinical Data:  Left-sided facial numbness and tingling; difficulty with gait.  Left eye erythema.  CT HEAD WITHOUT CONTRAST CT MAXILLOFACIAL WITHOUT CONTRAST  Technique:  Multidetector CT imaging of the head and maxillofacial structures were performed using the standard protocol without intravenous contrast. Multiplanar CT image reconstructions of the maxillofacial structures were also generated.  Comparison:  CT of the head performed 08/08/2011, and MRI/MRA of the brain performed 08/08/2011 and 08/09/2011  CT HEAD  Findings: There is no evidence of acute infarction, mass lesion, or intra- or extra-axial hemorrhage on CT.  Mild scattered periventricular and subcortical white matter change likely reflects small vessel ischemic microangiopathy.  Mild apparent decreased attenuation within the occipital lobes is thought to reflect artifact.  The posterior fossa, including the cerebellum, brainstem and fourth ventricle, is within normal limits.   The third and lateral ventricles, and basal ganglia are unremarkable in appearance.  The cerebral hemispheres are symmetric in appearance, with normal gray- white differentiation.  No mass effect or midline shift is seen.  There is no evidence of fracture; visualized osseous structures are unremarkable in appearance.  The orbits are within normal limits. There is mild partial opacification of the right frontal sinus; the remaining paranasal sinuses and mastoid air cells are well-aerated. No significant soft tissue abnormalities are seen.  IMPRESSION:  1.  No acute intracranial pathology seen on CT. 2.  Mild small vessel ischemic microangiopathy. 3.  Mild partial opacification of the right frontal sinus.  CT MAXILLOFACIAL  Findings:   There is no evidence of fracture or dislocation.  The maxilla and mandible appear intact.  The nasal bone is unremarkable in appearance.  The visualized dentition demonstrates no acute abnormality.  There is chronic absence of much  of the maxillary and mandibular dentition, with large dental caries involving the remaining maxillary teeth.  The orbits are intact bilaterally.  There is mild partial opacification of the right frontal sinus; the remaining paranasal sinuses and mastoid air cells are well-aerated.  No significant soft tissue abnormalities are seen.  The parapharyngeal fat planes are preserved.  The nasopharynx, oropharynx and hypopharynx are unremarkable in appearance.  The visualized portions of the valleculae and piriform sinuses are grossly unremarkable.  The parotid and submandibular glands are within normal limits.  No cervical lymphadenopathy is seen.  IMPRESSION:  1.  No evidence of fracture or dislocation. 2.  Mild partial opacification of the right frontal sinus; remaining paranasal sinuses and mastoid air cells are well-aerated. 3.  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   Original Report Authenticated By: Tonia Ghent, M.D.   Ct Maxillofacial Ltd Wo Cm  08/14/2011  *RADIOLOGY REPORT*  Clinical Data:  Left-sided facial numbness and tingling; difficulty with gait.  Left eye erythema.  CT HEAD WITHOUT CONTRAST CT MAXILLOFACIAL WITHOUT CONTRAST  Technique:  Multidetector CT imaging of the head and maxillofacial structures were performed using the standard protocol without intravenous contrast. Multiplanar CT image reconstructions of the maxillofacial structures were also generated.  Comparison:  CT of the head performed 08/08/2011, and MRI/MRA of the brain performed 08/08/2011 and 08/09/2011  CT HEAD  Findings: There is no evidence of acute infarction, mass lesion, or intra- or extra-axial hemorrhage on CT.  Mild scattered periventricular and subcortical white matter change likely reflects small vessel ischemic microangiopathy.  Mild apparent decreased attenuation within the occipital lobes is thought to reflect artifact.  The posterior fossa, including the cerebellum, brainstem and fourth ventricle, is within normal limits.  The third and lateral ventricles, and basal ganglia are unremarkable in appearance.  The cerebral hemispheres are symmetric in appearance, with normal gray- white differentiation.  No mass effect or midline shift is seen.  There is no evidence of fracture; visualized osseous structures are unremarkable in appearance.  The orbits are within normal limits. There is mild partial opacification of the right frontal sinus; the remaining paranasal sinuses and mastoid air cells are well-aerated. No significant soft tissue abnormalities are seen.  IMPRESSION:  1.  No acute intracranial pathology seen on CT. 2.  Mild small vessel ischemic microangiopathy. 3.  Mild partial opacification of the right frontal sinus.  CT MAXILLOFACIAL  Findings:   There is no evidence of fracture or dislocation.  The maxilla and mandible appear intact.  The nasal bone is unremarkable in  appearance.  The visualized dentition demonstrates no acute abnormality.  There is chronic absence of much of the maxillary and mandibular dentition, with large dental caries involving the remaining maxillary teeth.  The orbits are intact bilaterally.  There is mild partial opacification of the right frontal sinus; the remaining paranasal sinuses and mastoid air cells are well-aerated.  No significant soft tissue abnormalities are seen.  The parapharyngeal fat planes are preserved.  The nasopharynx, oropharynx and hypopharynx are unremarkable in appearance.  The visualized portions of the valleculae and piriform sinuses are grossly unremarkable.  The parotid and submandibular glands are within normal limits.  No cervical lymphadenopathy is seen.  IMPRESSION:  1.  No evidence of fracture or dislocation. 2.  Mild partial opacification of the right frontal sinus; remaining paranasal sinuses and mastoid air cells are well-aerated. 3.  Chronic absence of much of the maxillary  and mandibular dentition; large dental caries involving the remaining maxillary teeth.  Original Report Authenticated By: Tonia Ghent, M.D.     1. Sinusitis   2. Gait disturbance       MDM   Patient presenting with worsening gait today as well as left-sided facial numbness and tingling. Patient was seen last week for similar problems. Concern for TIA and she was admitted to the hospital. At that time she was found to be hypokalemic and dehydrated. MR/MRA/head CT were all negative for acute stroke or TIA. 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 She was put on supportive measures and blood pressure medication as well as aspirin and simvastatin were ordered. Patient states she was just able to get her medications filled today and has not taken any aspirin. She also states she is having bad sinus symptoms on the left and the left-sided facial tingling is new from today. She does not meet any  criteria for a stroke. On exam patient's gait appears to the left she has normal strength and sensation in her arms and legs. She has mild eyelid droop on the left but otherwise motor is intact. Concern for possible recurrent hypokalemia versus sinus symptoms versus neurologic. Head and sinus CT pending. CBC, BMP pending. Patient given IV fluid  1:23 AM Normal potassium today. CBC with a persistent leukocytosis of 16.5. Sinus CT showed some mild sinus disease and CT scan of the head was unchanged. Given patient's persistent sinus complaints on exam and elevated white count feel that this may be partially the cause of her current symptoms. There is no stroke symptoms today and did not feel that the patient needs hospitalization. She will continue taking the oral potassium and has a follow up with her doctor on Thursday. She knows to contact neurology or return to the ER if symptoms worsen. Throughout her stay here she is able to stand up and down without difficulty and when she was ready to be discharged her gait was improved.     Gwyneth Sprout, MD 08/14/11 4098  Gwyneth Sprout, MD 08/14/11 1191

## 2011-08-13 NOTE — ED Notes (Signed)
Patient transported to CT 

## 2011-08-13 NOTE — ED Notes (Signed)
Pt states she woke up with bilateral leg weakness at 1pm today.  States L worse than R.  States legs felt like "jelly" and she felt cold.  States when she walks she "pulls to the left."  Speech clear, no arm drift, grips strong and equal, able to raise and hold bilateral legs up without difficulty.  Reports numbness and tingling to L side of face.  Reports she slept until 1pm today.  Reports LSN last night.  States she came to ED on Tuesday for stroke symptoms and Potasium was 2.2  Called Dr. Anne Hahn and was told to come to ED if feeling worse.

## 2011-08-14 LAB — BASIC METABOLIC PANEL
Calcium: 10 mg/dL (ref 8.4–10.5)
GFR calc non Af Amer: >90 mL/min (ref >90)
Glucose, Bld: 103 mg/dL — ABNORMAL HIGH (ref 70–99)
Sodium: 137 mEq/L (ref 135–145)

## 2011-08-14 MED ORDER — AMOXICILLIN-POT CLAVULANATE 875-125 MG PO TABS: 1.0000 | ORAL_TABLET | Freq: Two times a day (BID) | ORAL | Status: AC

## 2011-08-14 MED ORDER — AMOXICILLIN-POT CLAVULANATE 875-125 MG PO TABS
1.0000 | ORAL_TABLET | Freq: Once | ORAL | Status: AC
  Administered 2011-08-14: 1 via ORAL
  Filled 2011-08-14: qty 1

## 2012-06-17 ENCOUNTER — Encounter: Payer: Self-pay | Admitting: Gastroenterology

## 2012-06-17 ENCOUNTER — Ambulatory Visit (INDEPENDENT_AMBULATORY_CARE_PROVIDER_SITE_OTHER): Payer: BC Managed Care – PPO | Admitting: Gastroenterology

## 2012-06-17 VITALS — BP 172/110 | HR 100 | Ht 68.0 in | Wt 220.8 lb

## 2012-06-17 DIAGNOSIS — R197 Diarrhea, unspecified: Secondary | ICD-10-CM

## 2012-06-17 DIAGNOSIS — K5289 Other specified noninfective gastroenteritis and colitis: Secondary | ICD-10-CM

## 2012-06-17 DIAGNOSIS — K52832 Lymphocytic colitis: Secondary | ICD-10-CM

## 2012-06-17 MED ORDER — BUDESONIDE 3 MG PO CP24: ORAL_CAPSULE | ORAL | Status: DC

## 2012-06-17 NOTE — Patient Instructions (Addendum)
You have been given a separate informational sheet regarding your tobacco use, the importance of quitting and local resources to help you quit.  We have sent the following medications to your pharmacy for you to pick up at your convenience: Entocort. Take 3 capsules (9mg ) by mouth once daily x 8 weeks, then reduce to 2 capsules (6mg ) by mouth once daily x 2 weeks, then reduce to one capsule (3mg ) by mouth daily ongoing.  Thank you for choosing me and [facility].  Venita Lick. Pleas Koch., MD., Clementeen Graham  cc: Benedetto Goad, MD

## 2012-06-17 NOTE — Progress Notes (Signed)
History of Present Illness: This is a 52 year old female with a history of lymphocytic colitis initially diagnosed in 2009. Symptoms were previously well-controlled on Entocort. She states she lost her health insurance and has not been taking Entocort. She relates 2-3 bowel movements on a typical day however she can have some days with up to 10 watery bowel movements. She occasionally notes crampy abdominal pain associated with her diarrhea. Denies weight loss, constipation, change in stool caliber, melena, hematochezia, nausea, vomiting, dysphagia, reflux symptoms, chest pain.  Current Medications, Allergies, Past Medical History, Past Surgical History, Family History and Social History were reviewed in [employer] record.  Physical Exam: General: Well developed , well nourished, no acute distress Head: Normocephalic and atraumatic Eyes:  sclerae anicteric, EOMI Ears: Normal auditory acuity Mouth: No deformity or lesions Lungs: Clear throughout to auscultation Heart: Regular rate and rhythm; no murmurs, rubs or bruits Abdomen: Soft, non tender and non distended. No masses, hepatosplenomegaly or hernias noted. Normal Bowel sounds Musculoskeletal: Symmetrical with no gross deformities  Pulses:  Normal pulses noted Extremities: No clubbing, cyanosis, edema or deformities noted Neurological: Alert oriented x 4, grossly nonfocal Psychological:  Alert and cooperative. Normal mood and affect  Assessment and Recommendations:  1. Lymphocytic colitis. Refill Entocort at 9 mg daily tapering to 6 mg daily and then 3 mg daily for maintenance. This regimen has been effective for her in the past. Return office visit 6 months. Consider the addition of antispasmodic if symptoms are not well controlled on Entocort.

## 2012-09-16 ENCOUNTER — Other Ambulatory Visit: Payer: Self-pay | Admitting: Neurosurgery

## 2012-09-26 ENCOUNTER — Encounter: Payer: Self-pay | Admitting: Gastroenterology

## 2012-09-27 ENCOUNTER — Encounter: Payer: Self-pay | Admitting: Gastroenterology

## 2012-10-09 ENCOUNTER — Encounter (HOSPITAL_COMMUNITY): Payer: Self-pay | Admitting: Pharmacy Technician

## 2012-10-10 NOTE — Pre-Procedure Instructions (Signed)
DINNA SEVERS  10/10/2012   Your procedure is scheduled on:  Monday October 21, 2012.  Report to [facility] at 5:30 AM.  Call this number if you have problems the morning of surgery: [phone]   Remember:   Do not eat food or drink liquids after midnight.   Take these medicines the morning of surgery with A SIP OF WATER: Bupropion (Wellbutrin),  Pregabalin (Lyrica)             Discontinue aspirin, and herbal medications 7 days prior to surgery (none after Monday June 9th)             Do not wear jewelry, make-up or nail polish.  Do not wear lotions, powders, or perfumes. You may wear deodorant.  Do not shave 48 hours prior to surgery.   Do not bring valuables to the hospital.  [facility] is not responsible for any belongings or valuables.  Contacts, dentures or bridgework may not be worn into surgery.  Leave suitcase in the car. After surgery it may be brought to your room.  For patients admitted to the hospital, checkout time is 11:00 AM the day of discharge.   Patients discharged the day of surgery will not be allowed to drive home.  Name and phone number of your driver: Family/Friend  Special Instructions: Shower using CHG 2 nights before surgery and the night before surgery.  If you shower the day of surgery use CHG.  Use special wash - you have one bottle of CHG for all showers.  You should use approximately 1/3 of the bottle for each shower.   Please read over the following fact sheets that you were given: Pain Booklet, Coughing and Deep Breathing, MRSA Information and Surgical Site Infection Prevention

## 2012-10-11 ENCOUNTER — Encounter (HOSPITAL_COMMUNITY)
Admission: RE | Admit: 2012-10-11 | Discharge: 2012-10-11 | Disposition: A | Discharge status: Home / Self Care | Payer: BC Managed Care – PPO | Source: Ambulatory Visit | Attending: Neurosurgery | Admitting: Neurosurgery

## 2012-10-11 ENCOUNTER — Encounter (HOSPITAL_COMMUNITY): Payer: Self-pay

## 2012-10-11 ENCOUNTER — Encounter (HOSPITAL_COMMUNITY): Payer: Self-pay | Admitting: Vascular Surgery

## 2012-10-11 DIAGNOSIS — Z01811 Encounter for preprocedural respiratory examination: Secondary | ICD-10-CM | POA: Insufficient documentation

## 2012-10-11 DIAGNOSIS — Z01810 Encounter for preprocedural cardiovascular examination: Secondary | ICD-10-CM | POA: Insufficient documentation

## 2012-10-11 DIAGNOSIS — Z01818 Encounter for other preprocedural examination: Secondary | ICD-10-CM | POA: Insufficient documentation

## 2012-10-11 DIAGNOSIS — Z01812 Encounter for preprocedural laboratory examination: Secondary | ICD-10-CM | POA: Insufficient documentation

## 2012-10-11 HISTORY — DX: Adverse effect of unspecified anesthetic, initial encounter: T41.45XA

## 2012-10-11 HISTORY — DX: Nausea with vomiting, unspecified: Z98.890

## 2012-10-11 HISTORY — DX: Other complications of anesthesia, initial encounter: T88.59XA

## 2012-10-11 HISTORY — DX: Other specified postprocedural states: R11.2

## 2012-10-11 LAB — BASIC METABOLIC PANEL
BUN: 6 mg/dL (ref 6–23)
Chloride: 99 mEq/L (ref 96–112)
Creatinine, Ser: 0.8 mg/dL (ref 0.50–1.10)
GFR calc Af Amer: >90 mL/min (ref >90)
Glucose, Bld: 124 mg/dL — ABNORMAL HIGH (ref 70–99)

## 2012-10-11 LAB — CBC
HCT: 47.3 % — ABNORMAL HIGH (ref 36.0–46.0)
Hemoglobin: 16.1 g/dL — ABNORMAL HIGH (ref 12.0–15.0)
MCH: 31 pg (ref 26.0–34.0)
MCHC: 34 g/dL (ref 30.0–36.0)
RDW: 13.7 % (ref 11.5–15.5)

## 2012-10-11 LAB — TYPE AND SCREEN
ABO/RH(D): A POS
Antibody Screen: NEGATIVE

## 2012-10-11 LAB — SURGICAL PCR SCREEN: MRSA, PCR: NEGATIVE

## 2012-10-11 NOTE — Progress Notes (Addendum)
Pt denies SOB, chest pain, and being under the care of a cardiologist. Pt positive for cough: pt stated " I have a cold." Revonda Standard, PA to review pt labs; WBC 15.2 and EKG.

## 2012-10-14 NOTE — Progress Notes (Addendum)
Anesthesia Chart Review:  Patient is a 52 year old female scheduled for L3-4 laminectomy/PLIF on 10/21/12 by Dr. Lovell Sheehan. History includes obesity, smoking, HTN, migraines, anxiety, depression, TIA 08/2011, lymphocytic colitis '09 (Dr. Claudette Head), tonsillectomy, tubal ligation. Anesthesia history includes combativeness as a child and post-operative N/V.  PCP is listed as Dr. Benedetto Goad.  EKG on 10/11/12 showed SR with frequent PVCs, poor r wave progression when compared to EKG on 08/08/11.  Echo on 08/09/11 showed: Normal LV size and systolic function, EF 60-65%. Mild LV hypertrophy. Normal RV size and systolic function. No significant valvular abnormalities.  Carotid duplex on 08/09/11 showed: Right: 60-79% proximal internal carotid artery stenosis in the bulb low end of range. Vertebral artery flow is antegrade. Left: No evidence of hemodynamically significant internal carotid artery stenosis. Vertebral artery flow is antegrade.   CXR on 10/11/12 showed no active disease.  Stable chronic mild bronchitic changes.    Preoperative labs noted.    I reviewed with anesthesiologist Dr. Gypsy Balsam.  Plan for medical evaluation for clearance due to EKG changes with frequent PVCs.  Will defer decision for additional testing or formal cardiology evaluation to her PCP and/or surgeon.  I notified Darl Pikes at Dr. York Ram office of need for clearance and of WBC results.  I also faxed her PAT results so she can forward them to patient's PCP.  Velna Ochs [facility]Anesthesiology Phone [phone] 10/14/2012 4:50 PM  Addendum: 10/16/12 1200 Patient's EKG, labs, echo were already faxed to Dr. Tawana Scale office and faxed back to Dr. Lovell Sheehan' office as reviewed.  Patient was also seen again at her PCP office on 10/16/12 and diagnosed with acute bronchitis and acute OM and treated with Keflex and a dexamethasone injection.  Ultimately, Dr. Andrey Campanile medically cleared patient for surgery.  Her surgery is not  until 10/21/12, hopefully her symptoms will have improved by then.  Further evaluation on the day of surgery by her surgeon and anesthesiologist.

## 2012-10-15 NOTE — Progress Notes (Signed)
Pt called PAT on 10/15/12 stating that she had a rash on her arm from the Mupirocin Ointment. Pt advised to discontinue treatment.

## 2012-10-21 ENCOUNTER — Inpatient Hospital Stay (HOSPITAL_COMMUNITY): Admission: RE | Admit: 2012-10-21 | Payer: BC Managed Care – PPO | Source: Ambulatory Visit | Admitting: Neurosurgery

## 2012-10-21 ENCOUNTER — Encounter (HOSPITAL_COMMUNITY): Admission: RE | Payer: Self-pay | Source: Ambulatory Visit

## 2012-10-21 SURGERY — POSTERIOR LUMBAR FUSION 1 LEVEL
Anesthesia: General

## 2012-12-25 ENCOUNTER — Other Ambulatory Visit: Payer: Self-pay

## 2012-12-25 MED ORDER — BUDESONIDE 3 MG PO CP24: 3.0000 mg | ORAL_CAPSULE | Freq: Every morning | ORAL | Status: DC

## 2013-03-10 ENCOUNTER — Other Ambulatory Visit: Payer: Self-pay | Admitting: Gastroenterology

## 2013-03-19 ENCOUNTER — Other Ambulatory Visit (HOSPITAL_COMMUNITY): Payer: Self-pay | Admitting: Specialist

## 2013-04-07 ENCOUNTER — Encounter: Payer: Self-pay | Admitting: Gastroenterology

## 2013-04-07 ENCOUNTER — Ambulatory Visit (INDEPENDENT_AMBULATORY_CARE_PROVIDER_SITE_OTHER): Payer: BC Managed Care – PPO | Admitting: Gastroenterology

## 2013-04-07 VITALS — BP 160/100 | HR 80 | Ht 68.0 in | Wt 228.0 lb

## 2013-04-07 DIAGNOSIS — Z800 Family history of malignant neoplasm of digestive organs: Secondary | ICD-10-CM

## 2013-04-07 DIAGNOSIS — K5289 Other specified noninfective gastroenteritis and colitis: Secondary | ICD-10-CM

## 2013-04-07 DIAGNOSIS — K52832 Lymphocytic colitis: Secondary | ICD-10-CM

## 2013-04-07 MED ORDER — BUDESONIDE 3 MG PO CP24: ORAL_CAPSULE | ORAL | Status: DC

## 2013-04-07 NOTE — Progress Notes (Signed)
    History of Present Illness: This is a 52 year old female with a history of lymphocytic colitis. She ran out of budesonide for about one month in her diarrhea returns having 5 to 7 loose bowel movements per day. She called in for refill of her maintenance dosage of budesonide 3 mg a day in her diarrhea has improved she is still having 2 loose stools per day. She has significant back pain back surgery scheduled later this month. She is overdue for a five-year high-risk screening colonoscopy.  Current Medications, Allergies, Past Medical History, Past Surgical History, Family History and Social History were reviewed in [employer] record.  Physical Exam: General: Well developed , well nourished, no acute distress, strong tobacco odor  Head: Normocephalic and atraumatic Eyes:  sclerae anicteric, EOMI Ears: Normal auditory acuity Mouth: No deformity or lesions Lungs: Clear throughout to auscultation Heart: Regular rate and rhythm; no murmurs, rubs or bruits Abdomen: Soft, non tender and non distended. No masses, hepatosplenomegaly or hernias noted. Normal Bowel sounds Musculoskeletal: Symmetrical with no gross deformities  Pulses:  Normal pulses noted Extremities: No clubbing, cyanosis, edema or deformities noted Neurological: Alert oriented x 4, grossly nonfocal Psychological:  Alert and cooperative. Normal mood and affect  Assessment and Recommendations:  1. Lymphocytic colitis. Refill Entocort at 9 mg daily tapering to 6 mg daily and then 3 mg daily for maintenance. This regimen has been effective for her in the past. Return office visit 12 months. Consider the addition of antispasmodic if symptoms are not well controlled on Entocort.  2. Family history of colon cancer in her father. Overdue for colonoscopy. Schedule colonoscopy after recovery from her back surgery in early 2015.  3. Tobacco abuse. Encouraged to quit and seek support to do so.

## 2013-04-07 NOTE — Patient Instructions (Signed)
We have sent the following medications to your pharmacy for you to pick up at your convenience: Entocort.  You will be due for a recall colonoscopy in 08/2013. We will send you a reminder in the mail when it gets closer to that time.  Thank you for choosing me and [facility].  Venita Lick. Pleas Koch., MD., Clementeen Graham   cc: Benedetto Goad, MD

## 2013-04-15 ENCOUNTER — Encounter (HOSPITAL_COMMUNITY): Payer: Self-pay | Admitting: Pharmacy Technician

## 2013-04-17 NOTE — Pre-Procedure Instructions (Signed)
[name]  04/17/2013   Your procedure is scheduled on:  Monday April 28, 2013 @ 7:30 am.  Report to [facility] Short Stay Entrance "A" Admitting at 5:30 AM.  Call this number if you have problems the morning of surgery: [phone]   Remember:   Do not eat food or drink liquids after midnight.   Take these medicines the morning of surgery with A SIP OF WATER: Pregabalin (Lyrica) Stop taking Aspirin, vitamins, and herbal medications. Do not take any NSAIDs ie: Ibuprofen, Advil, Naproxen or any medication containing Aspirin.  Do not wear jewelry, make-up or nail polish.  Do not wear lotions, powders, or perfumes. You may wear deodorant.  Do not shave 48 hours prior to surgery.   Do not bring valuables to the hospital.  [facility] is not responsible for any belongings or valuables.               Contacts, dentures or bridgework may not be worn into surgery.  Leave suitcase in the car. After surgery it may be brought to your room.  For patients admitted to the hospital, discharge time is determined by your treatment team.               Patients discharged the day of surgery will not be allowed to drive home.  Name and phone number of your driver: Family/Friend  Special Instructions: Shower using CHG 2 nights before surgery and the night before surgery.  If you shower the day of surgery use CHG.  Use special wash - you have one bottle of CHG for all showers.  You should use approximately 1/3 of the bottle for each shower.   Please read over the following fact sheets that you were given: Pain Booklet, Coughing and Deep Breathing, Blood Transfusion Information, MRSA Information and Surgical Site Infection Prevention

## 2013-04-18 ENCOUNTER — Encounter (HOSPITAL_COMMUNITY): Payer: Self-pay

## 2013-04-18 ENCOUNTER — Encounter (HOSPITAL_COMMUNITY)
Admission: RE | Admit: 2013-04-18 | Discharge: 2013-04-18 | Disposition: A | Discharge status: Home / Self Care | Payer: BC Managed Care – PPO | Source: Ambulatory Visit | Attending: Specialist | Admitting: Specialist

## 2013-04-18 DIAGNOSIS — Z01812 Encounter for preprocedural laboratory examination: Secondary | ICD-10-CM | POA: Insufficient documentation

## 2013-04-18 DIAGNOSIS — Z01818 Encounter for other preprocedural examination: Secondary | ICD-10-CM | POA: Insufficient documentation

## 2013-04-18 HISTORY — DX: Dermatitis, unspecified: L30.9

## 2013-04-18 LAB — URINALYSIS, ROUTINE W REFLEX MICROSCOPIC
Bilirubin Urine: NEGATIVE
Ketones, ur: NEGATIVE mg/dL
Leukocytes, UA: NEGATIVE
Nitrite: NEGATIVE
Protein, ur: NEGATIVE mg/dL
pH: 7 (ref 5.0–8.0)

## 2013-04-18 LAB — COMPREHENSIVE METABOLIC PANEL
ALT: 41 U/L — ABNORMAL HIGH (ref 0–35)
Alkaline Phosphatase: 115 U/L (ref 39–117)
BUN: 10 mg/dL (ref 6–23)
CO2: 26 mEq/L (ref 19–32)
Chloride: 99 mEq/L (ref 96–112)
GFR calc Af Amer: >90 mL/min (ref >90)
GFR calc non Af Amer: >90 mL/min (ref >90)
Glucose, Bld: 114 mg/dL — ABNORMAL HIGH (ref 70–99)
Potassium: 4.5 mEq/L (ref 3.5–5.1)
Sodium: 137 mEq/L (ref 135–145)
Total Bilirubin: 0.4 mg/dL (ref 0.3–1.2)
Total Protein: 7.4 g/dL (ref 6.0–8.3)

## 2013-04-18 LAB — CBC
HCT: 48.9 % — ABNORMAL HIGH (ref 36.0–46.0)
Hemoglobin: 16.5 g/dL — ABNORMAL HIGH (ref 12.0–15.0)
MCHC: 33.7 g/dL (ref 30.0–36.0)
RBC: 5.41 MIL/uL — ABNORMAL HIGH (ref 3.87–5.11)
WBC: 17.6 10*3/uL — ABNORMAL HIGH (ref 4.0–10.5)

## 2013-04-18 LAB — SURGICAL PCR SCREEN
MRSA, PCR: NEGATIVE
Staphylococcus aureus: POSITIVE — AB

## 2013-04-18 LAB — TYPE AND SCREEN: ABO/RH(D): A POS

## 2013-04-18 NOTE — Progress Notes (Signed)
04/18/13 0908  OBSTRUCTIVE SLEEP APNEA  Have you ever been diagnosed with sleep apnea through a sleep study? No  Do you snore loudly (loud enough to be heard through closed doors)?  1  Do you often feel tired, fatigued, or sleepy during the daytime? 1  Has anyone observed you stop breathing during your sleep? 0  Do you have, or are you being treated for high blood pressure? 1  BMI more than 35 kg/m2? 0  Age over 51 years old? 1  Neck circumference greater than 40 cm/18 inches? 0  Gender: 0  Obstructive Sleep Apnea Score 4  Score 4 or greater  Results sent to PCP

## 2013-04-18 NOTE — Progress Notes (Signed)
Pt did not show for 8:00AM PAT. A voice message was left on pt mobile phone.

## 2013-04-18 NOTE — Progress Notes (Signed)
Spoke with Tiffany (at [facility] regarding pt abnormal labs and allergy to Mupirocin with positive staph results.Tiffany to follow up with Dr. Otelia Sergeant, MD regarding labs (WBC 17.6) and alternative treatment for staph.

## 2013-04-21 NOTE — Progress Notes (Signed)
Anesthesia Chart Review: Patient is a 52 year old female scheduled for decompression and fusion L3-4 on 04/28/13 by Dr. Otelia Sergeant.  She was previously scheduled for L3-4 laminectomy/PLIF on 10/21/12 by Dr. Lovell Sheehan, but surgery was postponed due to acute bronchitis and acute OM. Anesthesia also had her follow-up with PCP Dr. Benedetto Goad due to frequent PVCs on EKG.  Surgery has since been re-scheduled with Dr. Otelia Sergeant, and she was medically cleared.  History includes obesity, smoking, HTN, migraines, anxiety, depression, TIA 08/2011, lymphocytic colitis '09 (Dr. Claudette Head), tonsillectomy, tubal ligation. Anesthesia history includes combativeness as a child and post-operative N/V. OSA screening score was 4 or greater. PCP is Dr. Benedetto Goad who cleared her for this procedure from a medical and cardiac standpoint.   EKG on 10/11/12 showed SR with frequent PVCs, poor r wave progression when compared to EKG on 08/08/11.   Echo on 08/09/11 showed: Normal LV size and systolic function, EF 60-65%. Mild LV hypertrophy. Normal RV size and systolic function. No significant valvular abnormalities.  Carotid duplex on 08/09/11 showed: Right: 60-79% proximal internal carotid artery stenosis in the bulb low end of range. Vertebral artery flow is antegrade. Left: No evidence of hemodynamically significant internal carotid artery stenosis. Vertebral artery flow is antegrade.   CXR on 10/11/12 showed no active disease. Stable chronic mild bronchitic changes.   Preoperative labs noted. WBC is 17.6K. (WBC has been 13.0-17.6 since 11/03/10, with most common result ~ 16K.) UA is WNL. She is on daily Entocort EC for lymphocytic colitis which may be contributing to leukocytosis. She was afebrile at PAT. Dr. Barbaraann Faster office has already been notified of elevated WBC. Will defer additional orders, if any, to Dr. Aleen Sells clinical correlation on the day of surgery.  If no obvious S/S of infection then I would anticipate that she could  proceed as planned.  Velna [facility] Short Stay Center/Anesthesiology Phone [phone] 04/21/2013 11:25 AM

## 2013-04-23 NOTE — H&P (Addendum)
Merin Borjon is an 52 y.o. female.   Chief Complaint: back and right >left leg pain     HISTORY OF PRESENT ILLNESS:  The patient a 52 year old female who has been experiencing discomfort and pain that has been ongoing since May of this year.  She relates that back pain has been present since May 2013.  Most of the pain is into the right buttock and into the right leg with some radiation of the right dorsal aspect lateral 3 toes.  She has some burning pain though medially over the anterior ankle as well with anesthesia and weakness in the right ankle and foot.  Noticed in therapy difficulties with trying to stand on the right toe. In May she stepped into a hole and this caused extension of her back.  Since then she has been having some severe pain into the right leg, radiating at nighttime, pain as well.  She has had a MRI study done.  She has had multiple injections by Dr. Alvester Morin, undergone therapy including use of ice massage, therapy without much benefits, as many as 4 epidural steroid injections have been done.  She had seen Dr. Lovell Sheehan and currently is taking medicines of Lyrica, Zanaflex, hydrocodone. On a scale of 1-10, she reports the pain is severe by as much as 7 or 8.  No history of previous back pain or back condition.  No pain with coughing or sneezing.  She says she has problems with knots in the muscles of her right leg and her pain is basically 24 hours 7 days a week, does not decrease.  Her ability to stand and ambulate is decreased.  She has pain when she goes to stand, pain when she tries to walk.  At the grocery store she is using a cart to hold herself up.  She notes she has to use it to get into the grocery store as well.  She complains of some concerns regarding urinary urgency. Her standing and walking tolerance is not improved with cortisone injections.  She does notice pain too when she bends, stoops, tries to lift anything over 10 pounds or so.  Ice seems to help.  The first 2  epidural steroid injections seem to help the last couple of days then they have returned.  MRI scan was done in March of 2014.  She has a history of problems related to the colitis to prevent her from having or using anti-inflammatory agents.  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  Previously had done financial type work as well.  This time she feels as though she needs to do something to improve her pain and try to get back to normal ability to stand and walk, and normal activities of daily living.  Her primary care physician is Dr. Benedetto Goad.  RADIOGRAPHS:   Plain radiographs with lateral flexion/extension views, AP view lumbar spine shows a very mild curvature of the lumbar spine, apex to the left at about the L3 and L2 levels.  There are some very mild spondylosis changes present at L4-5 and L3-4.  The curvatures in the range of about 15 degrees from T11 to L5.  Lateral radiograph of the lumbar spine shows a grade 1, early grade 2 spondylolisthesis of L3 on L4.  There is degenerative disk narrowing at L3-4, degenerative disk narrowing at L4-5, very mild degenerative disk narrowing at L2-3.  The  patient's MRI scan is reviewed.  It demonstrates disk bulge at the L4-5 level associated desiccation of disk and narrowing of the disk at L4-5.  There is also degenerative disk narrowing at L3-4 with grade 1 spondylolisthesis, lateral recess stenosis which is severe involving the right lateral recess greater than left and this would affect the L3, L4, L5 nerve roots on the right greater than left.    PLAN:   scheduled this patient  for decompression and TLIF at the L3-4 level, decompression in the form of [place] procedure.  Risk of surgery including infection, bleeding neurologic compromise discussed.  She will need to use the brace for a period of 3 months.  Also expect that she would be out of work physically for as long as 6  weeks to 8 weeks potentially may be able to return to work while at home via home access computer at a period of 3 weeks or 4 weeks postop.  She understands this, wishes to go ahead and proceed.     Past Medical History  Diagnosis Date  . Arthritis   . Migraines   . Hypertension   . High cholesterol   . Lymphocytic colitis   . TIA (transient ischemic attack)   . Anxiety   . Chronic headaches   . Depression   . Internal hemorrhoids   . Menometrorrhagia   . Cystocele   . Complication of anesthesia     Hx: combativeness as child  . PONV (postoperative nausea and vomiting)   . Eczema     Hx; of  . H/O impetigo     Hx: of    Past Surgical History  Procedure Laterality Date  . Incontinence surgery    . Cesarean section  1989, 1997  . Tonsillectomy    . Colonoscopy      Hx: of  . Tubal ligation      Family History  Problem Relation Age of Onset  . Colon cancer Father     79-65  . Hypertension Father   . Colon polyps    . Diabetes     Social History:  reports that she has been smoking Cigarettes.  She has been smoking about 1.00 pack per day. She has never used smokeless tobacco. She reports that she does not drink alcohol or use illicit drugs.  Allergies:  Allergies  Allergen Reactions  . Latex Other (See Comments)    REACTION: blisters  . Nsaids     irritates condition  . Oxycodone Nausea And Vomiting  . Mupirocin Rash    Rash with Mupirocin Ointment    Medications Prior to Admission  Medication Sig Dispense Refill  . amLODipine (NORVASC) 5 MG tablet Take 5 mg by mouth at bedtime.      Marland Kitchen aspirin-acetaminophen-caffeine (EXCEDRIN MIGRAINE) 250-250-65 MG per tablet Take 1 tablet by mouth every 6 (six) hours as needed. For headache      . budesonide (ENTOCORT EC) 3 MG 24 hr capsule Take 9 mg by mouth at bedtime.      Marland Kitchen lisinopril-hydrochlorothiazide (PRINZIDE,ZESTORETIC) 20-12.5 MG per tablet Take 1 tablet by mouth at bedtime.      . nortriptyline (PAMELOR) 10 MG  capsule Take 10 mg by mouth at bedtime.      . pregabalin (LYRICA) 75 MG capsule Take 150 mg by mouth 2 (two) times daily.        No results found for this or any previous visit (from the past 48 hour(s)). No results found.  Review  of Systems  Musculoskeletal: Positive for back pain.  All other systems reviewed and are negative.    Blood pressure 115/96, temperature 98.7 F (37.1 C), temperature source Oral, resp. rate 20, SpO2 97.00%. Physical Exam  Constitutional: She is oriented to person, place, and time. She appears well-developed and well-nourished.  HENT:  Head: Normocephalic and atraumatic.  Eyes: EOM are normal. Pupils are equal, round, and reactive to light.  Neck: Normal range of motion.  Cardiovascular: Normal rate.   Respiratory: Effort normal.  GI: Soft.  Musculoskeletal:   pushes up from a chair to gain an upright position.  She has difficulty standing on her toes on the right side.  On her heels she is able to get upright, walk a couple of steps both sides.  Forward bending placing a handout on the exam table, she is able to reach down almost to the floor about an inch or 2 short.  Extension of the back with pain.  She has to push from bending over to standing up to regain upright with some mild discomfort here.  Reflexes at the knee are 1+ to 2+ and symmetric.  At the ankle 2+ and symmetric.   Straight leg raise negative both sides.  Popliteal compression sign negative both sides.  No spasticity noted in either lower extremity.  No clonus noted.  There is weakness in her right knee and in extension it is 5-/5.  Right foot dorsiflexion strength appears intact though.  Right thigh circumference decreased by a quarter inch compared with the left, the right and left calf circumferences though appear to be equal.    Neurological: She is alert and oriented to person, place, and time.  Skin: Skin is warm and dry.  Psychiatric: She has a normal mood and affect.      Assessment/Plan L3-4 degenerative spondylolisthesis   PLAN:  L3-4 Gill procedure with right TLIF L3-4,cage,pedicle screw and rod instrumentation with allograft and local bone graft. Patient was seen and examined in the preop holding area. There has been no interval  Change in this patient's exam preop  history and physical exam  Lab tests and images have been examined and reviewed.  The Risks benefits and alternative treatments have been discussed  extensively,questions answered.  The patient has elected to undergo the discussed surgical treatment. Lugenia Assefa E 04/28/2013, 7:26 AM

## 2013-04-27 MED ORDER — CEFAZOLIN SODIUM-DEXTROSE 2-3 GM-% IV SOLR
2.0000 g | INTRAVENOUS | Status: AC
  Administered 2013-04-28: 2 g via INTRAVENOUS
  Filled 2013-04-27: qty 50

## 2013-04-28 ENCOUNTER — Ambulatory Visit (HOSPITAL_COMMUNITY): Payer: BC Managed Care – PPO

## 2013-04-28 ENCOUNTER — Encounter (HOSPITAL_COMMUNITY): Payer: Self-pay | Admitting: *Deleted

## 2013-04-28 ENCOUNTER — Inpatient Hospital Stay (HOSPITAL_COMMUNITY)
Admission: RE | Admit: 2013-04-28 | Discharge: 2013-05-01 | DRG: 460 | Disposition: A | Discharge status: Home / Self Care | Payer: BC Managed Care – PPO | Source: Ambulatory Visit | Attending: Specialist | Admitting: Specialist

## 2013-04-28 ENCOUNTER — Encounter (HOSPITAL_COMMUNITY)
Admission: RE | Disposition: A | Discharge status: Home / Self Care | Payer: Self-pay | Source: Ambulatory Visit | Attending: Specialist

## 2013-04-28 ENCOUNTER — Encounter (HOSPITAL_COMMUNITY): Payer: BC Managed Care – PPO | Admitting: Vascular Surgery

## 2013-04-28 ENCOUNTER — Ambulatory Visit (HOSPITAL_COMMUNITY): Payer: BC Managed Care – PPO | Admitting: Anesthesiology

## 2013-04-28 DIAGNOSIS — M48062 Spinal stenosis, lumbar region with neurogenic claudication: Secondary | ICD-10-CM | POA: Diagnosis present

## 2013-04-28 DIAGNOSIS — I10 Essential (primary) hypertension: Secondary | ICD-10-CM | POA: Diagnosis present

## 2013-04-28 DIAGNOSIS — Z6835 Body mass index (BMI) 35.0-35.9, adult: Secondary | ICD-10-CM

## 2013-04-28 DIAGNOSIS — M129 Arthropathy, unspecified: Secondary | ICD-10-CM | POA: Diagnosis present

## 2013-04-28 DIAGNOSIS — E7800 Pure hypercholesterolemia, unspecified: Secondary | ICD-10-CM | POA: Diagnosis present

## 2013-04-28 DIAGNOSIS — M47817 Spondylosis without myelopathy or radiculopathy, lumbosacral region: Secondary | ICD-10-CM | POA: Diagnosis present

## 2013-04-28 DIAGNOSIS — G43909 Migraine, unspecified, not intractable, without status migrainosus: Secondary | ICD-10-CM | POA: Diagnosis present

## 2013-04-28 DIAGNOSIS — K648 Other hemorrhoids: Secondary | ICD-10-CM | POA: Diagnosis present

## 2013-04-28 DIAGNOSIS — F17200 Nicotine dependence, unspecified, uncomplicated: Secondary | ICD-10-CM | POA: Diagnosis present

## 2013-04-28 DIAGNOSIS — M4316 Spondylolisthesis, lumbar region: Secondary | ICD-10-CM | POA: Diagnosis present

## 2013-04-28 DIAGNOSIS — F341 Dysthymic disorder: Secondary | ICD-10-CM | POA: Diagnosis present

## 2013-04-28 DIAGNOSIS — N920 Excessive and frequent menstruation with regular cycle: Secondary | ICD-10-CM | POA: Diagnosis present

## 2013-04-28 DIAGNOSIS — M4310 Spondylolisthesis, site unspecified: Principal | ICD-10-CM | POA: Diagnosis present

## 2013-04-28 DIAGNOSIS — IMO0002 Reserved for concepts with insufficient information to code with codable children: Secondary | ICD-10-CM | POA: Diagnosis present

## 2013-04-28 DIAGNOSIS — Z01812 Encounter for preprocedural laboratory examination: Secondary | ICD-10-CM

## 2013-04-28 HISTORY — PX: LUMBAR FUSION: SHX111

## 2013-04-28 SURGERY — POSTERIOR LUMBAR FUSION 1 LEVEL
Anesthesia: General | Site: Spine Lumbar

## 2013-04-28 MED ORDER — ASPIRIN-ACETAMINOPHEN-CAFFEINE 250-250-65 MG PO TABS: 1.0000 | ORAL_TABLET | Freq: Four times a day (QID) | ORAL | Status: DC | PRN

## 2013-04-28 MED ORDER — PHENOL 1.4 % MT LIQD: 1.0000 | OROMUCOSAL | Status: DC | PRN

## 2013-04-28 MED ORDER — NEOSTIGMINE METHYLSULFATE 1 MG/ML IJ SOLN
INTRAMUSCULAR | Status: DC | PRN
  Administered 2013-04-28: 4 mg via INTRAVENOUS

## 2013-04-28 MED ORDER — SENNOSIDES-DOCUSATE SODIUM 8.6-50 MG PO TABS: 1.0000 | ORAL_TABLET | Freq: Every evening | ORAL | Status: DC | PRN

## 2013-04-28 MED ORDER — SODIUM CHLORIDE 0.9 % IJ SOLN
3.0000 mL | Freq: Two times a day (BID) | INTRAMUSCULAR | Status: DC
  Administered 2013-04-30: 3 mL via INTRAVENOUS

## 2013-04-28 MED ORDER — HYDROMORPHONE HCL 2 MG PO TABS
4.0000 mg | ORAL_TABLET | ORAL | Status: DC | PRN
  Administered 2013-04-28 – 2013-04-30 (×5): 4 mg via ORAL
  Filled 2013-04-28 (×5): qty 2

## 2013-04-28 MED ORDER — THROMBIN 20000 UNITS EX SOLR
CUTANEOUS | Status: AC
  Filled 2013-04-28: qty 20000

## 2013-04-28 MED ORDER — FLEET ENEMA 7-19 GM/118ML RE ENEM: 1.0000 | ENEMA | Freq: Once | RECTAL | Status: AC | PRN

## 2013-04-28 MED ORDER — METHOCARBAMOL 100 MG/ML IJ SOLN
500.0000 mg | Freq: Four times a day (QID) | INTRAVENOUS | Status: DC | PRN
  Filled 2013-04-28: qty 5

## 2013-04-28 MED ORDER — MIDAZOLAM HCL 2 MG/2ML IJ SOLN: 0.5000 mg | Freq: Once | INTRAMUSCULAR | Status: DC | PRN

## 2013-04-28 MED ORDER — BUDESONIDE 3 MG PO CP24
9.0000 mg | ORAL_CAPSULE | Freq: Every day | ORAL | Status: DC
  Administered 2013-04-29 – 2013-04-30 (×2): 9 mg via ORAL
  Filled 2013-04-28 (×3): qty 3

## 2013-04-28 MED ORDER — ROCURONIUM BROMIDE 100 MG/10ML IV SOLN
INTRAVENOUS | Status: DC | PRN
  Administered 2013-04-28: 10 mg via INTRAVENOUS
  Administered 2013-04-28: 40 mg via INTRAVENOUS

## 2013-04-28 MED ORDER — ONDANSETRON HCL 4 MG/2ML IJ SOLN
INTRAMUSCULAR | Status: DC | PRN
  Administered 2013-04-28: 4 mg via INTRAVENOUS

## 2013-04-28 MED ORDER — MEPERIDINE HCL 25 MG/ML IJ SOLN: 6.2500 mg | INTRAMUSCULAR | Status: DC | PRN

## 2013-04-28 MED ORDER — ZOLPIDEM TARTRATE 5 MG PO TABS: 5.0000 mg | ORAL_TABLET | Freq: Every evening | ORAL | Status: DC | PRN

## 2013-04-28 MED ORDER — CHLORHEXIDINE GLUCONATE 4 % EX LIQD: 60.0000 mL | Freq: Once | CUTANEOUS | Status: DC

## 2013-04-28 MED ORDER — ONDANSETRON HCL 4 MG/2ML IJ SOLN
4.0000 mg | INTRAMUSCULAR | Status: DC | PRN
  Administered 2013-04-28 – 2013-04-30 (×5): 4 mg via INTRAVENOUS
  Filled 2013-04-28 (×6): qty 2

## 2013-04-28 MED ORDER — HYDROCHLOROTHIAZIDE 12.5 MG PO CAPS
12.5000 mg | ORAL_CAPSULE | Freq: Every day | ORAL | Status: DC
  Administered 2013-04-28 – 2013-04-30 (×3): 12.5 mg via ORAL
  Filled 2013-04-28 (×4): qty 1

## 2013-04-28 MED ORDER — PROMETHAZINE HCL 25 MG/ML IJ SOLN
6.2500 mg | INTRAMUSCULAR | Status: DC | PRN
  Administered 2013-04-28: 12.5 mg via INTRAVENOUS

## 2013-04-28 MED ORDER — CEFAZOLIN SODIUM 1-5 GM-% IV SOLN
1.0000 g | Freq: Three times a day (TID) | INTRAVENOUS | Status: AC
  Administered 2013-04-28 – 2013-04-29 (×2): 1 g via INTRAVENOUS
  Filled 2013-04-28 (×2): qty 50

## 2013-04-28 MED ORDER — LACTATED RINGERS IV SOLN
INTRAVENOUS | Status: DC | PRN
  Administered 2013-04-28 (×3): via INTRAVENOUS

## 2013-04-28 MED ORDER — FENTANYL CITRATE 0.05 MG/ML IJ SOLN
INTRAMUSCULAR | Status: AC
  Filled 2013-04-28: qty 2

## 2013-04-28 MED ORDER — SODIUM CHLORIDE 0.9 % IJ SOLN: 3.0000 mL | INTRAMUSCULAR | Status: DC | PRN

## 2013-04-28 MED ORDER — MIDAZOLAM HCL 5 MG/5ML IJ SOLN
INTRAMUSCULAR | Status: DC | PRN
  Administered 2013-04-28: 2 mg via INTRAVENOUS

## 2013-04-28 MED ORDER — 0.9 % SODIUM CHLORIDE (POUR BTL) OPTIME
TOPICAL | Status: DC | PRN
  Administered 2013-04-28: 1000 mL

## 2013-04-28 MED ORDER — PREGABALIN 50 MG PO CAPS
150.0000 mg | ORAL_CAPSULE | Freq: Two times a day (BID) | ORAL | Status: DC
  Administered 2013-04-28 – 2013-05-01 (×6): 150 mg via ORAL
  Filled 2013-04-28 (×6): qty 3

## 2013-04-28 MED ORDER — ALBUMIN HUMAN 5 % IV SOLN
INTRAVENOUS | Status: DC | PRN
  Administered 2013-04-28: 10:00:00 via INTRAVENOUS

## 2013-04-28 MED ORDER — PROPOFOL 10 MG/ML IV BOLUS
INTRAVENOUS | Status: DC | PRN
  Administered 2013-04-28: 200 mg via INTRAVENOUS

## 2013-04-28 MED ORDER — AMLODIPINE BESYLATE 5 MG PO TABS
5.0000 mg | ORAL_TABLET | Freq: Every day | ORAL | Status: DC
  Administered 2013-04-29 – 2013-04-30 (×2): 5 mg via ORAL
  Filled 2013-04-28 (×4): qty 1

## 2013-04-28 MED ORDER — THROMBIN 20000 UNITS EX SOLR
CUTANEOUS | Status: DC | PRN
  Administered 2013-04-28: 11:00:00 via TOPICAL

## 2013-04-28 MED ORDER — HYDROMORPHONE HCL PF 1 MG/ML IJ SOLN
1.0000 mg | INTRAMUSCULAR | Status: DC | PRN
  Administered 2013-04-28 (×2): 2 mg via INTRAVENOUS
  Administered 2013-04-29: 1 mg via INTRAVENOUS
  Administered 2013-04-29: 2 mg via INTRAVENOUS
  Filled 2013-04-28 (×4): qty 2

## 2013-04-28 MED ORDER — HYDROCORTISONE SOD SUCCINATE 100 MG IJ SOLR
100.0000 mg | Freq: Two times a day (BID) | INTRAMUSCULAR | Status: AC
  Administered 2013-04-28 – 2013-04-29 (×2): 100 mg via INTRAVENOUS
  Filled 2013-04-28 (×2): qty 2

## 2013-04-28 MED ORDER — METHOCARBAMOL 500 MG PO TABS
500.0000 mg | ORAL_TABLET | Freq: Four times a day (QID) | ORAL | Status: DC | PRN
  Administered 2013-04-28 – 2013-04-29 (×3): 500 mg via ORAL
  Filled 2013-04-28 (×3): qty 1

## 2013-04-28 MED ORDER — PROMETHAZINE HCL 25 MG/ML IJ SOLN
INTRAMUSCULAR | Status: AC
  Filled 2013-04-28: qty 1

## 2013-04-28 MED ORDER — BUPIVACAINE-EPINEPHRINE 0.5% -1:200000 IJ SOLN
INTRAMUSCULAR | Status: DC | PRN
  Administered 2013-04-28: 20 mL

## 2013-04-28 MED ORDER — VECURONIUM BROMIDE 10 MG IV SOLR
INTRAVENOUS | Status: DC | PRN
  Administered 2013-04-28: 2 mg via INTRAVENOUS
  Administered 2013-04-28: 1 mg via INTRAVENOUS

## 2013-04-28 MED ORDER — MORPHINE SULFATE 4 MG/ML IJ SOLN
INTRAMUSCULAR | Status: AC
  Administered 2013-04-28: 4 mg
  Filled 2013-04-28: qty 1

## 2013-04-28 MED ORDER — DOCUSATE SODIUM 100 MG PO CAPS
100.0000 mg | ORAL_CAPSULE | Freq: Two times a day (BID) | ORAL | Status: DC
  Administered 2013-04-28 – 2013-04-30 (×5): 100 mg via ORAL
  Filled 2013-04-28 (×9): qty 1

## 2013-04-28 MED ORDER — LISINOPRIL-HYDROCHLOROTHIAZIDE 20-12.5 MG PO TABS: 1.0000 | ORAL_TABLET | Freq: Every day | ORAL | Status: DC

## 2013-04-28 MED ORDER — MENTHOL 3 MG MT LOZG: 1.0000 | LOZENGE | OROMUCOSAL | Status: DC | PRN

## 2013-04-28 MED ORDER — PHENYLEPHRINE HCL 10 MG/ML IJ SOLN
10.0000 mg | INTRAVENOUS | Status: DC | PRN
  Administered 2013-04-28: 11:00:00 via INTRAVENOUS
  Administered 2013-04-28: 50 ug/min via INTRAVENOUS

## 2013-04-28 MED ORDER — HYDROCODONE-ACETAMINOPHEN 5-325 MG PO TABS
1.0000 | ORAL_TABLET | ORAL | Status: DC | PRN
  Administered 2013-04-28: 1 via ORAL
  Administered 2013-04-29 – 2013-05-01 (×4): 2 via ORAL
  Filled 2013-04-28 (×7): qty 2

## 2013-04-28 MED ORDER — GLYCOPYRROLATE 0.2 MG/ML IJ SOLN
INTRAMUSCULAR | Status: DC | PRN
  Administered 2013-04-28: .5 mg via INTRAVENOUS

## 2013-04-28 MED ORDER — ACETAMINOPHEN 650 MG RE SUPP: 650.0000 mg | RECTAL | Status: DC | PRN

## 2013-04-28 MED ORDER — KCL IN DEXTROSE-NACL 20-5-0.45 MEQ/L-%-% IV SOLN
INTRAVENOUS | Status: DC
  Administered 2013-04-28 – 2013-04-29 (×2): via INTRAVENOUS
  Filled 2013-04-28 (×5): qty 1000

## 2013-04-28 MED ORDER — LISINOPRIL 20 MG PO TABS
20.0000 mg | ORAL_TABLET | Freq: Every day | ORAL | Status: DC
  Administered 2013-04-29 – 2013-04-30 (×2): 20 mg via ORAL
  Filled 2013-04-28 (×4): qty 1

## 2013-04-28 MED ORDER — SUCCINYLCHOLINE CHLORIDE 20 MG/ML IJ SOLN
INTRAMUSCULAR | Status: DC | PRN
  Administered 2013-04-28: 100 mg via INTRAVENOUS

## 2013-04-28 MED ORDER — BUPIVACAINE-EPINEPHRINE PF 0.5-1:200000 % IJ SOLN
INTRAMUSCULAR | Status: AC
  Filled 2013-04-28: qty 30

## 2013-04-28 MED ORDER — NORTRIPTYLINE HCL 10 MG PO CAPS
10.0000 mg | ORAL_CAPSULE | Freq: Every day | ORAL | Status: DC
  Administered 2013-04-28 – 2013-04-30 (×3): 10 mg via ORAL
  Filled 2013-04-28 (×4): qty 1

## 2013-04-28 MED ORDER — BISACODYL 10 MG RE SUPP: 10.0000 mg | Freq: Every day | RECTAL | Status: DC | PRN

## 2013-04-28 MED ORDER — HYDROCODONE-ACETAMINOPHEN 5-325 MG PO TABS
ORAL_TABLET | ORAL | Status: AC
  Filled 2013-04-28: qty 1

## 2013-04-28 MED ORDER — LACTATED RINGERS IV SOLN
INTRAVENOUS | Status: DC | PRN
  Administered 2013-04-28: 08:00:00 via INTRAVENOUS

## 2013-04-28 MED ORDER — MORPHINE SULFATE 2 MG/ML IJ SOLN
1.0000 mg | INTRAMUSCULAR | Status: DC | PRN
  Administered 2013-04-28: 4 mg via INTRAVENOUS

## 2013-04-28 MED ORDER — LIDOCAINE HCL (CARDIAC) 20 MG/ML IV SOLN
INTRAVENOUS | Status: DC | PRN
  Administered 2013-04-28: 60 mg via INTRAVENOUS

## 2013-04-28 MED ORDER — FENTANYL CITRATE 0.05 MG/ML IJ SOLN
25.0000 ug | INTRAMUSCULAR | Status: DC | PRN
  Administered 2013-04-28 (×2): 25 ug via INTRAVENOUS

## 2013-04-28 MED ORDER — FENTANYL CITRATE 0.05 MG/ML IJ SOLN
INTRAMUSCULAR | Status: DC | PRN
  Administered 2013-04-28 (×3): 50 ug via INTRAVENOUS
  Administered 2013-04-28: 150 ug via INTRAVENOUS
  Administered 2013-04-28 (×3): 50 ug via INTRAVENOUS

## 2013-04-28 MED ORDER — PANTOPRAZOLE SODIUM 40 MG IV SOLR
40.0000 mg | Freq: Every day | INTRAVENOUS | Status: DC
  Administered 2013-04-29 – 2013-04-30 (×3): 40 mg via INTRAVENOUS
  Filled 2013-04-28 (×4): qty 40

## 2013-04-28 MED ORDER — ACETAMINOPHEN 325 MG PO TABS: 650.0000 mg | ORAL_TABLET | ORAL | Status: DC | PRN

## 2013-04-28 MED ORDER — SODIUM CHLORIDE 0.9 % IV SOLN: 250.0000 mL | INTRAVENOUS | Status: DC

## 2013-04-28 MED ORDER — ARTIFICIAL TEARS OP OINT
TOPICAL_OINTMENT | OPHTHALMIC | Status: DC | PRN
  Administered 2013-04-28: 1 via OPHTHALMIC

## 2013-04-28 MED ORDER — PHENYLEPHRINE HCL 10 MG/ML IJ SOLN
INTRAMUSCULAR | Status: DC | PRN
  Administered 2013-04-28: 80 ug via INTRAVENOUS
  Administered 2013-04-28: 40 ug via INTRAVENOUS
  Administered 2013-04-28: 120 ug via INTRAVENOUS
  Administered 2013-04-28 (×2): 80 ug via INTRAVENOUS

## 2013-04-28 SURGICAL SUPPLY — 70 items
BLADE SURG ROTATE 9660 (MISCELLANEOUS) IMPLANT
BUR ROUND FLUTED 4 SOFT TCH (BURR) ×2 IMPLANT
CAGE CONCORDE BULLET 9X8X27 (Cage) ×1 IMPLANT
CAGE SPNL 5D BLT NOSE 27X9X8X (Cage) ×1 IMPLANT
CLOTH BEACON ORANGE TIMEOUT ST (SAFETY) ×2 IMPLANT
CORDS BIPOLAR (ELECTRODE) ×2 IMPLANT
COVER MAYO STAND STRL (DRAPES) ×4 IMPLANT
COVER SURGICAL LIGHT HANDLE (MISCELLANEOUS) ×2 IMPLANT
DERMABOND ADHESIVE PROPEN (GAUZE/BANDAGES/DRESSINGS) ×1
DERMABOND ADVANCED (GAUZE/BANDAGES/DRESSINGS) ×1
DERMABOND ADVANCED .7 DNX12 (GAUZE/BANDAGES/DRESSINGS) ×1 IMPLANT
DERMABOND ADVANCED .7 DNX6 (GAUZE/BANDAGES/DRESSINGS) ×1 IMPLANT
DRAPE C-ARM 42X72 X-RAY (DRAPES) ×2 IMPLANT
DRAPE SURG 17X23 STRL (DRAPES) ×6 IMPLANT
DRAPE TABLE COVER HEAVY DUTY (DRAPES) ×2 IMPLANT
DRSG MEPILEX BORDER 4X4 (GAUZE/BANDAGES/DRESSINGS) IMPLANT
DRSG MEPILEX BORDER 4X8 (GAUZE/BANDAGES/DRESSINGS) ×2 IMPLANT
DURAPREP 26ML APPLICATOR (WOUND CARE) ×2 IMPLANT
ELECT BLADE 6.5 EXT (BLADE) IMPLANT
ELECT CAUTERY BLADE 6.4 (BLADE) ×2 IMPLANT
ELECT REM PT RETURN 9FT ADLT (ELECTROSURGICAL) ×2
ELECTRODE REM PT RTRN 9FT ADLT (ELECTROSURGICAL) ×1 IMPLANT
EVACUATOR 1/8 PVC DRAIN (DRAIN) IMPLANT
GLOVE BIO SURGEON STRL SZ 6.5 (GLOVE) ×2 IMPLANT
GLOVE BIOGEL PI IND STRL 6.5 (GLOVE) ×1 IMPLANT
GLOVE BIOGEL PI IND STRL 7.5 (GLOVE) ×1 IMPLANT
GLOVE BIOGEL PI INDICATOR 6.5 (GLOVE) ×1
GLOVE BIOGEL PI INDICATOR 7.5 (GLOVE) ×1
GLOVE ECLIPSE 7.0 STRL STRAW (GLOVE) ×2 IMPLANT
GLOVE ECLIPSE 8.5 STRL (GLOVE) ×2 IMPLANT
GLOVE SURG 8.5 LATEX PF (GLOVE) ×2 IMPLANT
GOWN PREVENTION PLUS LG XLONG (DISPOSABLE) IMPLANT
GOWN PREVENTION PLUS XXLARGE (GOWN DISPOSABLE) ×2 IMPLANT
GOWN STRL NON-REIN LRG LVL3 (GOWN DISPOSABLE) ×4 IMPLANT
HEMOSTAT SURGICEL 2X14 (HEMOSTASIS) IMPLANT
KIT BASIN OR (CUSTOM PROCEDURE TRAY) ×2 IMPLANT
KIT POSITION SURG JACKSON T1 (MISCELLANEOUS) ×2 IMPLANT
KIT ROOM TURNOVER OR (KITS) ×2 IMPLANT
NEEDLE 22X1 1/2 (OR ONLY) (NEEDLE) ×2 IMPLANT
NEEDLE ASP BONE MRW 11GX15 (NEEDLE) IMPLANT
NEEDLE BONE MARROW 8GAX6 (NEEDLE) IMPLANT
NEEDLE SPNL 18GX3.5 QUINCKE PK (NEEDLE) ×2 IMPLANT
NS IRRIG 1000ML POUR BTL (IV SOLUTION) ×2 IMPLANT
PACK LAMINECTOMY ORTHO (CUSTOM PROCEDURE TRAY) ×2 IMPLANT
PAD ARMBOARD 7.5X6 YLW CONV (MISCELLANEOUS) ×4 IMPLANT
PATTIES SURGICAL .75X.75 (GAUZE/BANDAGES/DRESSINGS) ×2 IMPLANT
PATTIES SURGICAL 1X1 (DISPOSABLE) ×2 IMPLANT
ROD PREBENT EXPEDIUM 6.35 40MM (Rod) ×2 IMPLANT
ROD PREBENT EXPEDIUM 6.35 45MM (Rod) ×2 IMPLANT
SCREW EXP 6.35 POLY 6X35MM (Screw) ×2 IMPLANT
SCREW EXP 6.35 POLY 6X40MM (Screw) ×2 IMPLANT
SCREW EXPEDIUM POLY 6.35 6X45 (Screw) ×2 IMPLANT
SCREW POLY EXPEDIUM 6.35 5.35 (Screw) ×2 IMPLANT
SCREW SET EXPEDIUM 6.35 (Screw) ×8 IMPLANT
SHEET CONFORM 45LX20WX5H (Bone Implant) ×2 IMPLANT
SPONGE GAUZE 4X4 12PLY (GAUZE/BANDAGES/DRESSINGS) ×2 IMPLANT
SUT VIC AB 0 CT1 27 (SUTURE) ×1
SUT VIC AB 0 CT1 27XBRD ANBCTR (SUTURE) ×1 IMPLANT
SUT VIC AB 1 CTX 36 (SUTURE) ×2
SUT VIC AB 1 CTX36XBRD ANBCTR (SUTURE) ×2 IMPLANT
SUT VIC AB 2-0 CT1 27 (SUTURE) ×1
SUT VIC AB 2-0 CT1 TAPERPNT 27 (SUTURE) ×1 IMPLANT
SUT VICRYL 0 CT 1 36IN (SUTURE) ×4 IMPLANT
SUT VICRYL 4-0 PS2 18IN ABS (SUTURE) ×4 IMPLANT
SYR CONTROL 10ML LL (SYRINGE) ×4 IMPLANT
TOWEL OR 17X24 6PK STRL BLUE (TOWEL DISPOSABLE) ×2 IMPLANT
TOWEL OR 17X26 10 PK STRL BLUE (TOWEL DISPOSABLE) ×2 IMPLANT
TRAY FOLEY CATH 16FRSI W/METER (SET/KITS/TRAYS/PACK) ×2 IMPLANT
WATER STERILE IRR 1000ML POUR (IV SOLUTION) ×2 IMPLANT
YANKAUER SUCT BULB TIP NO VENT (SUCTIONS) ×2 IMPLANT

## 2013-04-28 NOTE — Op Note (Signed)
04/28/2013  11:09 AM  PATIENT:  [name]  52 y.o. female  MRN: [id number]  OPERATIVE REPORT  PRE-OPERATIVE DIAGNOSIS:  L3-4 degenerative spondylolisthesis, lumbar stenosis  POST-OPERATIVE DIAGNOSIS:  L3-4 degenerative spondylolisthesis, lumbar stenosis  PROCEDURE:  Procedure(s): L3-4 Gill Procedure with Right Transforaminal Lumbar Interbody Fusion L3-4, Pedicle Screws, rods and cage, local bone graft    SURGEON:  Kerrin Champagne, MD     ASSISTANT:  Maud Deed, PA-C  (Present throughout the entire procedure and necessary for completion of procedure in a timely manner)     ANESTHESIA:  General,    COMPLICATIONS:  None.     COMPONENTS: DEPUY 3 x 6.56mm expedium screws, 1 x 5.59mm expedium screw, left 40 mm rod, right 45mm rod, 27mm lordotic concorde cage. Conform Demineralized Bone Matrix 10cc.  DRAINS: Foley to SD.   PROCEDURE:  The patient was met in the holding area, and the appropriate right L3-4 level was identified and marked with an x and my initials.The patient was then transported to OR. The patient was then placed under general anesthesia without difficulty.The patient received appropriate preoperative antibiotic prophylaxis ancef. Nursing staff inserted a Foley catheter under sterile conditions. She was then turned to a prone position [place] spine frame was used for this case. All pressure points were well padded PAS stocking applied bilateral lower extremity to prevent DVT. Standard prep DuraPrep solution from the lower dorsal spine to the mid sacral level. Draped in the usual manner. Time-out procedure was called and correct .  The incision made L3 to L5 and carried superiorly an additional one level to the L2 spinous process.  Bovie electric cautery was used to control bleeding and carefully dissection was carried down along the lateral aspects of the spinous process of L3 toL4. Cobb then used to carefully elevate the paralumbar muscle and the incision in the  midline was carried to to the level of the base of the residual spinous processes. A clamp was then inserted between the spinous processes of L3 and L4  and intraoperative lateral confirmation of the appropriate level with C-arm sterilely draped. Osteotome was then used to resect the inferior articular process of L3 removing with 3 mm Kerrison and Penfield 4 and pituitary rongeur. The interspinous ligament was then resected between L2-3 and L3-4. Loupe magnification and headlight were used for this portion of the procedure. 3 mm Kerrison used to carefully free of the ligamentum flavum attachment off the superior aspect of the lamina of L4 and inferior aspect of lamina of L3 and the superior aspect of L3 Facet capsules at L3-4 were resect and the neural arch resected using Leksell rongeur as well as Kerrison removing the larger portions of the spinous process central portions of the lamina small amount of residual or and inferior articular process from a which were carefully resected using Kerrisons. Ligamentum flavum at the L3-4 level was then resected superiorly off the attachments to the volar aspect of the inferior aspect of the lamina of L3 and the medial aspect of the L3-4 facet. Decompression was then carried out over both L3 neuroforamen using a 3 mm Kerrisons left side neuroforamen remained quite narrowed secondary to listhesis of the L3 vertebral body. Medial superior articular process of L4 was then carefully resect using osteotomes and Kerrison so that the L3-4 disc was exposed bipolar electrocautery used carefully cauterized vascular leash within the axillary area the L3 nerve on the right side. Residual pars interarticularis overlying the right L3 neuroforamen was then  resected using 2 and 3 mm Kerrisons. The right L3 nerve root well decompressed. Hockey-stick nerve probe was it will be passed out the right L3 neuroforamen and identifying the superior aspect of the L4 pedicle then osteotome used to  resect the superior articular process of L4 transversely at the superior level of the L4 pedicle. This allowed for a wide decompression right L4 neuroforamen.Right L4 nerve roots and lateral aspect of the thecal sac at the L3-4 level was then able to be mobilized and Penfield 4 entered into the disc space and L3-4 level identified and the nerve structures retracted with Derricho retractors. 15 blade scalpel used to incise the disc the right side. Pituitary rongeur and used to debride degenerative spur on the disc space the right side. A discectomy on the right side was performed. With debridement of degenerative disc material from the right side L3-4 disc, the disc space was dilated using 7 mm through 8 mm disc space dilators.The disc space was then prepared using straight curette up-biting right and up-biting left and right curettes and ring curettes. Degenerated disc as well as a cartilage endplates debrided from the disc using pituitary rongeurs. The disc space examined demonstrating good bleeding endplate bone surfaces. Bone graft that was harvested from the L3 neural arch was placed into the intervertebral disc space after first sounding the disc space for the correct cage size. Cage chosen was a 8 mm Concorde lordotic cage. Additional bone graft was harvested from the facet areas on both sides. The Local bone graft was then impacted into the intervertebral disc space at L3-4 first placing bone graft within the disc space using a forceps then impacted with an 8 mm trial cage. After performing this 3 times stem the permanent 8 mm Concorde lordotic cage packed with bone graft was then inserted in approximately 15-20 of convergence. Careful inspection of the spinal canal demonstrated no surgery bone graft within the spinal canal both the L3 and L4 nerve roots appeared to exit without nerve compression. C-arm images were used to confirm the trial size for the permanent cage implant as well as positioning of the  implant well within the intervertebral disc this cage was subset deep to the posterior aspect of the disc some 2-3 mm. Attention then turned to performance of insertion of pedicle screws the L3-4 level.  An awl was then used to make an entry point into the intersection of the lateral aspect of the L3 inferior aspect of the superior articular process with right L3 transverse process observed on C-arm fluoroscopy to be in good position alignment with the pedicle of L3 observed on lateral view. Handle held pedicle probe was then used to make an opening into the central portions of the pedicle of L3 on the right side. C-arm fluoroscopy used to verify the position alignment of the pedicle probe depth at 40 mm. Ball-tipped probe indicated that there was no broaching of the cortex within the pedicle opening on the right L3. The pedicle was tapped with a 4mm tap and probed a second time to ensure no cortical penetration. Decortication of the transverse process of the L3 and the superior and lateral aspects of the L3 superior articular process was then carried out using a high-speed bur. The bone graft was applied to the area between the transverse process of right L3 and the L4 transverse processes. Ball-tipped probe was then used to probe the channel placed on the right side at L4 pedicle again showing no broaching cortex. The  35 mm x 5 mm pedicle screw was then inserted at the right L3 level in the appropriate degree of convergence and lordosis. Attention then turned to the right L4 pedicle where an awl was used to make an entry point at the intersection of the transverse process of and the lateral aspect of the pedicle, observed on C arm to be in the correct position and alignment. Hand held pedicle probe then used to probe the pedicle to a depth of 35 mm. Ball tip probe used to verify patency of the opening into the pedicle at L4 on right. Tapping with a 5 mm tap a 35 mm x 6 mm screw was used after decortication of the  transverse process and application of local bone graft. A precontoured 45 mm rod was then carefully inserted into the fasteners extending from L3-4 on the right side. The fastener caps at L4 and L3 level was then placed and torqued to 80 foot-pounds with  compression between the fasteners L4 and L3 was then performed. Cap at the L4 level was tightened to 80 foot pounds. Attention then turned to the placement of the pedicle screws on the left side at L3 this was done similar to the right side. awl used to make an initial entry point verified with C-arm fluoroscopy then a hand-held pedicle probe used to probe the pedicle channel, checked with a ball-tip probe and verify no broaching of cortex. Pedicle probe of L3 noted on C-arm fluoroscopy to be in the correct degree of lordosis centered within the pedicle and a length of about 45 mm screw. Tapping was then performed using is 5 mm tap again checking the opening within the pedicle with a pedicle probe to ensure no broaching cortex. 6 mm x 45 mm pedicle screw Depuy variable axis type was then inserted into the left L3 pedicle after first decorticating the transverse process portion of L3 and the posterior lateral superior articular process and transverse process on the left side at L4. The awl then placed over the posterior lateral aspect of the superior articular process of L4 and observed on C-arm to be in good position and alignment. A hand-held pedicle probe then used to probe the pedicle of L4 depth of 40 mm. The pedicle opening was then probed with a ball-tipped probe to ensure no broaching of the cortex. Tapped with a 5 mm tap a 40 mm screw x 6mm screw was placed at the left L4 level. A 40 mm precontoured rod inserted on the left side at L4-5. The bone graft was then inserted between the transverse processes of L4 and L3 posterior laterally. Pedicle screws noted to be inserted in excellent position alignment observed with C-arm fluoroscopy. A 45 mm rod was then  inserted into the fasteners extending from L5-S1 the left side. Fastener caps then applied to the L4 and L5 pedicle screw fasteners and these were tightened to 80 foot-pounds. The fastener cap at the L4 level was then inserted and compression obtained between the fastener at L4 and at L5 and then tightened to 80 foot-pounds. Irrigation was carried out using copious amounts of irrigant solution. Thrombin-soaked Gelfoam were placed for hemostasis was then carefully removed. Permanent C-arm images were obtained in AP, oblique and lateral positions for documentation purposes. They showed the pedicle screws rods to be in good position alignment from L3-4. Spondylolisthesis at L3-4 was improved. Cell Saver was used during this case with 0 cc of autogenous blood return to the patient. Estimated blood loss  was 250 cc. The deep paralumbar muscles were approximated with 1 Vicryl sutures, the lumbodorsal fascia approximated with 1 Vicryl sutures. Subcutaneous layers were then approximated with interrupted 0 and 2-0 Vicryl sutures , skin closed with a running subcuticular 4-0 Vicryl suture. Mepilex bandage was then applied. Thel instrument and sponge counts were correct. Patient was then returned to the supine position reactivated extubated and returned to the recovery room in satisfactory condition.    Maud Deed PA-C for the duties of assistant surgeon throughout this case she assisted loupe magnification retracting delicate neural structures suctioning over the counter neural structures throughout the cane bilaterally at the lower bar segment and superiorly. Is present from the beginning of the case to the end of the case. Assisted in positioning the patient having in positioning the arm and legs. She also participated in removal the patient from the operating table.     Raejean Swinford E  04/28/2013, 11:09 AM

## 2013-04-28 NOTE — Preoperative (Signed)
Beta Blockers   Reason not to administer Beta Blockers:Not Applicable 

## 2013-04-28 NOTE — Progress Notes (Signed)
Patient was complaining of pain at 1730 and I explained to patient that I had given her everything that had been ordered for pain. Notified PA on call, Rexene Edison, orders received.  After giving pt IV dilaudid, I checked on pt, she was sleeping soundly and her o2 sats were in mid 70's, .  I awakened pt and helped her to sit on side of bed and her o2 sats came up to 98% on 2L o2.  Nsg to continue to monitor.

## 2013-04-28 NOTE — Anesthesia Postprocedure Evaluation (Signed)
  Anesthesia Post-op Note  Patient: [name]  Procedure(s) Performed: Procedure(s): L3-4 Gill Procedure with Right Transforaminal Lumbar Interbody Fusion L3-4, Pedicle Screws, rods and cage, local bone graft (N/A)  Patient Location: PACU  Anesthesia Type:General  Level of Consciousness: awake, alert  and oriented  Airway and Oxygen Therapy: Patient Spontanous Breathing and Patient connected to nasal cannula oxygen  Post-op Pain: mild  Post-op Assessment: Post-op Vital signs reviewed, Patient's Cardiovascular Status Stable, Respiratory Function Stable, Patent Airway and Pain level controlled  Post-op Vital Signs: stable  Complications: No apparent anesthesia complications

## 2013-04-28 NOTE — Transfer of Care (Signed)
Immediate Anesthesia Transfer of Care Note  Patient: [name]  Procedure(s) Performed: Procedure(s): L3-4 Gill Procedure with Right Transforaminal Lumbar Interbody Fusion L3-4, Pedicle Screws, rods and cage, local bone graft (N/A)  Patient Location: PACU  Anesthesia Type:General  Level of Consciousness: awake, alert , oriented and patient cooperative  Airway & Oxygen Therapy: Patient Spontanous Breathing and Patient connected to face mask oxygen  Post-op Assessment: Report given to PACU RN and Post -op Vital signs reviewed and stable  Post vital signs: Reviewed  Complications: No apparent anesthesia complications

## 2013-04-28 NOTE — Anesthesia Preprocedure Evaluation (Addendum)
Anesthesia Evaluation  Patient identified by MRN, date of birth, ID band Patient awake    Reviewed: Allergy & Precautions, H&P , Patient's Chart, lab work & pertinent test results, reviewed documented beta blocker date and time   History of Anesthesia Complications (+) PONV and history of anesthetic complications  Airway Mallampati: III TM Distance: <3 FB Neck ROM: full    Dental  (+) Dental Advisory Given, Poor Dentition, Chipped, Loose and Missing   Pulmonary Current Smoker,  breath sounds clear to auscultation        Cardiovascular Exercise Tolerance: Good hypertension, Pt. on medications Rhythm:regular Rate:Normal     Neuro/Psych  Headaches, PSYCHIATRIC DISORDERS Anxiety Depression TIA   GI/Hepatic negative GI ROS, Neg liver ROS,   Endo/Other  Morbid obesity  Renal/GU negative Renal ROS     Musculoskeletal   Abdominal   Peds  Hematology   Anesthesia Other Findings Stop bang +  Reproductive/Obstetrics negative OB ROS                         Anesthesia Physical Anesthesia Plan  ASA: III  Anesthesia Plan: General ETT   Post-op Pain Management:    Induction:   Airway Management Planned: Video Laryngoscope Planned  Additional Equipment:   Intra-op Plan:   Post-operative Plan:   Informed Consent: I have reviewed the patients History and Physical, chart, labs and discussed the procedure including the risks, benefits and alternatives for the proposed anesthesia with the patient or authorized representative who has indicated his/her understanding and acceptance.   Dental Advisory Given  Plan Discussed with: CRNA and Surgeon  Anesthesia Plan Comments:        Anesthesia Quick Evaluation

## 2013-04-28 NOTE — Brief Op Note (Signed)
04/28/2013  11:06 AM  PATIENT:  [name]  52 y.o. female  PRE-OPERATIVE DIAGNOSIS:  L3-4 degenerative spondylolisthesis, lumbar stenosis  POST-OPERATIVE DIAGNOSIS:  L3-4 degenerative spondylolisthesis, lumbar stenosis  PROCEDURE:  Procedure(s): L3-4 Gill Procedure with Right Transforaminal Lumbar Interbody Fusion L3-4, Pedicle Screws, rods and cage, local bone graft (N/A)  SURGEON:  Surgeon(s) and Role:    * Kerrin Champagne, MD - Primary  PHYSICIAN ASSISTANT: Ermalene Postin  ANESTHESIA:   local, general and Dr. Michelle Piper  EBL:  Total I/O In: 2750 [I.V.:2500; IV Piggyback:250] Out: 700 [Urine:300; Blood:400]  BLOOD ADMINISTERED:none  DRAINS: Urinary Catheter (Foley)   LOCAL MEDICATIONS USED:  MARCAINE    and Amount: 20 ml  SPECIMEN:  No Specimen  DISPOSITION OF SPECIMEN:  N/A  COUNTS:  YES  DICTATION: .Dragon Dictation  PLAN OF CARE: Admit to inpatient   PATIENT DISPOSITION:  PACU - hemodynamically stable.   Delay start of Pharmacological VTE agent (>24hrs) due to surgical blood loss or risk of bleeding: yes

## 2013-04-28 NOTE — Anesthesia Procedure Notes (Signed)
Procedure Name: Intubation Date/Time: 04/28/2013 7:39 AM Performed by: Lovie Chol Pre-anesthesia Checklist: Patient identified, Emergency Drugs available, Suction available, Patient being monitored and Timeout performed Patient Re-evaluated:Patient Re-evaluated prior to inductionOxygen Delivery Method: Circle system utilized Preoxygenation: Pre-oxygenation with 100% oxygen Intubation Type: IV induction Ventilation: Mask ventilation without difficulty Grade View: Grade I Tube type: Oral Tube size: 7.5 mm Number of attempts: 1 Airway Equipment and Method: Video-laryngoscopy and Rigid stylet Placement Confirmation: positive ETCO2,  ETT inserted through vocal cords under direct vision,  breath sounds checked- equal and bilateral and CO2 detector Secured at: 22 cm Tube secured with: Tape Dental Injury: Teeth and Oropharynx as per pre-operative assessment

## 2013-04-28 NOTE — Progress Notes (Signed)
Orthopedic Tech Progress Note Patient Details:  [name] 10-14-1960 [id number] Called [employer] for brace Patient ID: Alona Bene, female   DOB: 30-Dec-1960, 52 y.o.   MRN: [id number]   Jennye Moccasin 04/28/2013, 3:20 PM

## 2013-04-29 LAB — CBC
HCT: 42 % (ref 36.0–46.0)
Hemoglobin: 13.4 g/dL (ref 12.0–15.0)
MCH: 30.7 pg (ref 26.0–34.0)
MCHC: 31.9 g/dL (ref 30.0–36.0)
RBC: 4.36 MIL/uL (ref 3.87–5.11)
RDW: 13.9 % (ref 11.5–15.5)

## 2013-04-29 LAB — BASIC METABOLIC PANEL
BUN: 10 mg/dL (ref 6–23)
CO2: 30 mEq/L (ref 19–32)
Creatinine, Ser: 0.76 mg/dL (ref 0.50–1.10)
GFR calc non Af Amer: >90 mL/min (ref >90)
Glucose, Bld: 170 mg/dL — ABNORMAL HIGH (ref 70–99)
Potassium: 4.6 mEq/L (ref 3.5–5.1)

## 2013-04-29 MED ORDER — SODIUM CHLORIDE 0.9 % IV SOLN
INTRAVENOUS | Status: DC
  Administered 2013-04-29 – 2013-04-30 (×3): via INTRAVENOUS

## 2013-04-29 MED ORDER — PROMETHAZINE HCL 25 MG/ML IJ SOLN
12.5000 mg | Freq: Four times a day (QID) | INTRAMUSCULAR | Status: DC | PRN
  Administered 2013-04-29 – 2013-05-01 (×4): 12.5 mg via INTRAVENOUS
  Filled 2013-04-29 (×5): qty 1

## 2013-04-29 MED ORDER — NICOTINE 21 MG/24HR TD PT24
21.0000 mg | MEDICATED_PATCH | Freq: Every day | TRANSDERMAL | Status: DC
  Administered 2013-04-29 – 2013-04-30 (×2): 21 mg via TRANSDERMAL
  Filled 2013-04-29 (×3): qty 1

## 2013-04-29 NOTE — Progress Notes (Signed)
Utilization review completed.  

## 2013-04-29 NOTE — Progress Notes (Signed)
Patient with continued N&V. She states it is related to her migraine headache and the pain medicine. Zofran PRN is being given as often as possible. Bowel sounds remain hypoactive at this point. Dr. Otelia Sergeant was paged and new orders for PRN phenergan received. Will continue to monitor.

## 2013-04-29 NOTE — Progress Notes (Signed)
Patient ID: [name], female   DOB: 30-May-1960, 52 y.o.   MRN: [id number] Subjective: 1 Day Post-Op Procedure(s) (LRB): L3-4 Gill Procedure with Right Transforaminal Lumbar Interbody Fusion L3-4, Pedicle Screws, rods and cage, local bone graft (N/A) Awake, alert and oriented x 4. Nausea all night and this AM. No leg pain. C/o pain and when given iv narcotics she has respiratory Depression, this in the PACU and yesterday evening. She appears to request narcotics as long as she is awake. I explained that The medications will not relieve all of the pain, she had surgery. Careful with use of meds as she should not receive narcotics when She is sedated already with narcotics.  Patient reports pain as marked.    Objective:   VITALS:  Temp:  [97.2 F (36.2 C)-97.7 F (36.5 C)] 97.6 F (36.4 C) (12/23 1445) Pulse Rate:  [70-85] 79 (12/23 1445) Resp:  [16-18] 18 (12/23 1445) BP: (91-132)/(45-76) 132/61 mmHg (12/23 1445) SpO2:  [95 %-99 %] 99 % (12/23 1445)  Neurologically intact ABD soft Sensation intact distally Intact pulses distally Dorsiflexion/Plantar flexion intact Incision: dressing C/D/I   LABS  Recent Labs  04/29/13 0610  HGB 13.4  WBC 17.0*  PLT 225    Recent Labs  04/29/13 0610  NA 140  K 4.6  CL 101  CO2 30  BUN 10  CREATININE 0.76  GLUCOSE 170*   No results found for this basename: LABPT, INR,  in the last 72 hours   Assessment/Plan: 1 Day Post-Op Procedure(s) (LRB): L3-4 Gill Procedure with Right Transforaminal Lumbar Interbody Fusion L3-4, Pedicle Screws, rods and cage, local bone graft (N/A)  Slow advance diet as she is nauseated. Up with therapy. Increase IVF.   NITKA,JAMES E 04/29/2013, 6:29 PM

## 2013-04-29 NOTE — Evaluation (Signed)
Occupational Therapy Evaluation Patient Details Name: [name] MRN: [id number] DOB: 1960-06-21 Today's Date: 04/29/2013 Time: 4098-1191 OT Time Calculation (min): 23 min  OT Assessment / Plan / Recommendation History of present illness L3-4 Gill Procedure with Right Transforaminal Lumbar Interbody Fusion L3-4, Pedicle Screws, rods and cage, local bone graft   Clinical Impression   [name] admitted and underwent above presents to acute OT with increased pain, back precautions, decreased memory for back precautions, decreased knowledge of bracing donning/doffing, only 43 yo child to A her at home. Will benefit from short stay at SNF.    OT Assessment  Patient needs continued OT Services    Follow Up Recommendations  SNF    Barriers to Discharge  (52 year old at home)    Equipment Recommendations  3 in 1 bedside comode       Frequency  Min 3X/week    Precautions / Restrictions Precautions Precautions: Back Precaution Booklet Issued: Yes (comment) Required Braces or Orthoses: Spinal Brace Spinal Brace: Applied in sitting position Restrictions Weight Bearing Restrictions: No   Pertinent Vitals/Pain 8/10 back; repositioned, cold applied, and RN made aware    ADL  Eating/Feeding: Supervision/safety Where Assessed - Eating/Feeding: Edge of bed Grooming: Min guard Where Assessed - Grooming: Supported standing Upper Body Bathing: Set up;Supervision/safety Where Assessed - Upper Body Bathing: Unsupported sitting Lower Body Bathing: Maximal assistance Where Assessed - Lower Body Bathing: Supported sit to stand Upper Body Dressing: Minimal assistance Where Assessed - Upper Body Dressing: Unsupported sitting Lower Body Dressing: +1 Total assistance Where Assessed - Lower Body Dressing: Supported sit to Pharmacist, hospital: Minimal assistance Toilet Transfer Method: Sit to Barista: Comfort height toilet;Grab bars Toileting - Designer, fashion/clothing and Hygiene: Moderate assistance Where Assessed - Toileting Clothing Manipulation and Hygiene: Sit to stand from 3-in-1 or toilet Equipment Used: Back brace;Gait belt;Rolling walker Transfers/Ambulation Related to ADLs: Min a for all with RW    OT Diagnosis: Generalized weakness;Acute pain  OT Problem List: Decreased strength;Decreased activity tolerance;Impaired balance (sitting and/or standing);Pain;Obesity;Decreased knowledge of precautions;Decreased knowledge of use of DME or AE OT Treatment Interventions: Self-care/ADL training;DME and/or AE instruction;Balance training;Patient/family education   OT Goals(Current goals can be found in the care plan section) Acute Rehab OT Goals OT Goal Formulation: With patient Time For Goal Achievement: 05/06/13 Potential to Achieve Goals: Good  Visit Information  Last OT Received On: 04/29/13 Assistance Needed: +1 PT/OT/SLP Co-Evaluation/Treatment: Yes Reason for Co-Treatment: For patient/therapist safety OT goals addressed during session: ADL's and self-care;Proper use of Adaptive equipment and DME History of Present Illness: L3-4 Gill Procedure with Right Transforaminal Lumbar Interbody Fusion L3-4, Pedicle Screws, rods and cage, local bone graft       Prior Functioning     Home Living Family/patient expects to be discharged to:: Private residence Living Arrangements: Children Available Help at Discharge: Family;Available PRN/intermittently Type of Home: House Home Access: Stairs to enter [employer] of Steps: 2 Entrance Stairs-Rails: None Home Layout: One level Home Equipment: Hand held shower head Prior Function Level of Independence: Independent Communication Communication: No difficulties Dominant Hand: Right         Vision/Perception Vision - History Patient Visual Report: No change from baseline   Cognition  Cognition Arousal/Alertness: Lethargic Behavior During Therapy: WFL for tasks  assessed/performed Overall Cognitive Status: Within Functional Limits for tasks assessed    Extremity/Trunk Assessment Upper Extremity Assessment Upper Extremity Assessment: Overall WFL for tasks assessed     Mobility Bed Mobility  Bed Mobility: Rolling Left;Left Sidelying to Sit;Sitting - Scoot to Edge of Bed;Sit to Sidelying Left Rolling Left: 4: Min guard;With rail Left Sidelying to Sit: 4: Min assist;With rails;HOB flat Sitting - Scoot to Edge of Bed: 4: Min guard;With rail Sit to Sidelying Left: 3: Mod assist;With rail;HOB flat (BIl LEs) Details for Bed Mobility Assistance: VCs for seqencing Transfers Transfers: Sit to Stand;Stand to Sit Sit to Stand: 4: Min assist;With upper extremity assist;From bed Stand to Sit: 4: Min assist;With upper extremity assist;To bed Details for Transfer Assistance: VCs for safe hand placement           End of Session OT - End of Session Equipment Utilized During Treatment: Gait belt;Rolling walker;Back brace Activity Tolerance: Patient limited by pain Patient left: in bed;with call bell/phone within reach Nurse Communication: Mobility status       [name] [phone] 04/29/2013, 11:21 AM

## 2013-04-29 NOTE — Evaluation (Signed)
Physical Therapy Evaluation Patient Details Name: [name] MRN: [id number] DOB: 1960-07-01 Today's Date: 04/29/2013 Time: 4098-1191 PT Time Calculation (min): 23 min  PT Assessment / Plan / Recommendation History of Present Illness  L3-4 Gill Procedure with Right Transforaminal Lumbar Interbody Fusion L3-4, Pedicle Screws, rods and cage, local bone graft  Clinical Impression  This patient presents with acute pain and decreased functional independence following the above mentioned procedure. At the time of PT eval, pt required frequent cueing to maintain back precautions, and min assist for transfers. This patient is appropriate for skilled PT interventions to address functional limitations, improve safety and independence with functional mobility, and return to PLOF.     PT Assessment  Patient needs continued PT services    Follow Up Recommendations  SNF    Does the patient have the potential to tolerate intense rehabilitation      Barriers to Discharge Decreased caregiver support 51 year old daughter at home, who is in school. Only available to help during the day throughout school break.    Equipment Recommendations  Rolling walker with 5" wheels;3in1 (PT)    Recommendations for Other Services     Frequency Min 5X/week    Precautions / Restrictions Precautions Precautions: Back;Fall Precaution Booklet Issued: Yes (comment) Precaution Comments: Discussed 3/3 back precautions, however pt was falling asleep at the time Required Braces or Orthoses: Spinal Brace Spinal Brace: Applied in sitting position Restrictions Weight Bearing Restrictions: No   Pertinent Vitals/Pain 9/10 after session was over. RN called for pain meds however pt states she is too nauseated to take her pills. Pt quickly fell asleep upon return to bed, without receiving pain medication. RN notified.       Mobility  Bed Mobility Bed Mobility: Rolling Left;Left Sidelying to Sit;Sitting - Scoot to  Edge of Bed;Sit to Sidelying Left Rolling Left: 4: Min guard;With rail Left Sidelying to Sit: 4: Min assist;With rails;HOB flat Sitting - Scoot to Edge of Bed: 4: Min guard;With rail Sit to Sidelying Left: 3: Mod assist;With rail;HOB flat Details for Bed Mobility Assistance: VC's for sequencing and technique. Assist to elevate bilat LE's into bed during transfer to S/L.  Transfers Transfers: Sit to Stand;Stand to Sit Sit to Stand: 4: Min assist;With upper extremity assist;From bed Stand to Sit: 4: Min assist;With upper extremity assist;To bed Details for Transfer Assistance: VC's for hand placement on seated surface. Ambulation/Gait Ambulation/Gait Assistance: 4: Min assist Ambulation Distance (Feet): 20 Feet Assistive device: Rolling walker Ambulation/Gait Assistance Details: VC's for sequencing and safety awareness with the RW. Pt required min assist for occasional walker placement and to recover from LOB in bathroom.  Gait Pattern: Step-through pattern;Decreased stride length;Shuffle Gait velocity: Decreased Stairs: No    Exercises     PT Diagnosis: Difficulty walking;Acute pain  PT Problem List: Decreased strength;Decreased range of motion;Decreased activity tolerance;Decreased balance;Decreased mobility;Decreased knowledge of use of DME;Decreased safety awareness;Decreased knowledge of precautions;Pain PT Treatment Interventions:       PT Goals(Current goals can be found in the care plan section) Acute Rehab PT Goals Patient Stated Goal: To decrease pain PT Goal Formulation: With patient Time For Goal Achievement: 05/06/13 Potential to Achieve Goals: Good  Visit Information  Last PT Received On: 04/29/13 Assistance Needed: +1 PT/OT/SLP Co-Evaluation/Treatment: Yes Reason for Co-Treatment: For patient/therapist safety PT goals addressed during session: Mobility/safety with mobility;Balance;Proper use of DME OT goals addressed during session: ADL's and self-care;Proper use  of Adaptive equipment and DME History of Present Illness: L3-4 Gill Procedure with  Right Transforaminal Lumbar Interbody Fusion L3-4, Pedicle Screws, rods and cage, local bone graft       Prior Functioning  Home Living Family/patient expects to be discharged to:: Private residence Living Arrangements: Children Available Help at Discharge: Family;Available PRN/intermittently Type of Home: House Home Access: Stairs to enter [employer] of Steps: 2 Entrance Stairs-Rails: None Home Layout: One level Home Equipment: None Prior Function Level of Independence: Independent Communication Communication: No difficulties Dominant Hand: Right    Cognition  Cognition Arousal/Alertness: Lethargic Behavior During Therapy: WFL for tasks assessed/performed Overall Cognitive Status: Within Functional Limits for tasks assessed    Extremity/Trunk Assessment Upper Extremity Assessment Upper Extremity Assessment: Defer to OT evaluation Lower Extremity Assessment Lower Extremity Assessment: Overall WFL for tasks assessed Cervical / Trunk Assessment Cervical / Trunk Assessment: Normal   Balance    End of Session PT - End of Session Equipment Utilized During Treatment: Gait belt;Back brace Activity Tolerance: Patient limited by pain Patient left: in bed;with call bell/phone within reach Nurse Communication: Mobility status  GP     Ruthann Cancer 04/29/2013, 11:57 AM  Ruthann Cancer, PT, DPT [phone]

## 2013-04-29 NOTE — Care Management Note (Signed)
CARE MANAGEMENT NOTE 04/29/2013  Patient:  [name],[name]   Account Number:  [id number]  Date Initiated:  04/29/2013  Documentation initiated by:  Vance Peper  Subjective/Objective Assessment:   52 yr old female s/p L3-L4 lumbar fusion     Action/Plan:   PT reccommending SNF. Will follow   Anticipated DC Date:     Anticipated DC Plan:           Choice offered to / List presented to:             Status of service:  In process, will continue to follow

## 2013-04-30 LAB — BASIC METABOLIC PANEL
BUN: 8 mg/dL (ref 6–23)
Chloride: 93 mEq/L — ABNORMAL LOW (ref 96–112)
Creatinine, Ser: 0.62 mg/dL (ref 0.50–1.10)
GFR calc Af Amer: >90 mL/min (ref >90)
Glucose, Bld: 148 mg/dL — ABNORMAL HIGH (ref 70–99)
Potassium: 3.5 mEq/L (ref 3.5–5.1)

## 2013-04-30 NOTE — Progress Notes (Signed)
Physical Therapy Treatment Patient Details Name: [name] MRN: [id number] DOB: 05-18-1960 Today's Date: 04/30/2013 Time: 4098-1191 PT Time Calculation (min): 20 min  PT Assessment / Plan / Recommendation  History of Present Illness L3-4 Gill Procedure with Right Transforaminal Lumbar Interbody Fusion L3-4, Pedicle Screws, rods and cage, local bone graft   PT Comments   Pt much improved functionally since yesterday. Pt able to perform functional mobility and transfers with supervision and little VC's for technique or safety. Pt continues to need cueing to maintain back precautions, however overall pt is safe. D/C plan updated to home with HHPT to follow, as pt is showing overall supervision level of assist, as well as pt refusal of SNF at this time.   Follow Up Recommendations  Home health PT     Does the patient have the potential to tolerate intense rehabilitation     Barriers to Discharge        Equipment Recommendations  Rolling walker with 5" wheels;3in1 (PT)    Recommendations for Other Services    Frequency Min 5X/week   Progress towards PT Goals Progress towards PT goals: Progressing toward goals  Plan Discharge plan needs to be updated    Precautions / Restrictions Precautions Precautions: Back;Fall Precaution Comments: Pt able to recall 2/3 back precautions. 3/3 reviewed and discussed. Required Braces or Orthoses: Spinal Brace Spinal Brace: Applied in sitting position Restrictions Weight Bearing Restrictions: No   Pertinent Vitals/Pain 8/10 migarine - 7/10 back; family brought Excedrin Migraine medicine for her which she took in the room. Nursing notified.     Mobility  Bed Mobility Bed Mobility: Rolling Right;Right Sidelying to Sit;Sitting - Scoot to Edge of Bed Rolling Right: 5: Supervision;With rail Right Sidelying to Sit: 5: Supervision;With rails Sitting - Scoot to Edge of Bed: 5: Supervision Details for Bed Mobility Assistance: VC's to maintain back  precautions, as pt was slightly twisting when rolling to the side. Transfers Transfers: Sit to Stand;Stand to Sit Sit to Stand: 4: Min guard;From bed;With upper extremity assist Stand to Sit: 4: Min guard;To chair/3-in-1;With upper extremity assist Details for Transfer Assistance: Pt demonstrated proper hand placement and safety awareness. Ambulation/Gait Ambulation/Gait Assistance: 4: Min assist Ambulation Distance (Feet): 100 Feet Assistive device: Rolling walker Ambulation/Gait Assistance Details: VC's for sequencing and safety awareness with the walker. Cues to maintain back precautions. Gait Pattern: Step-through pattern;Decreased stride length Gait velocity: Decreased Stairs: No    Exercises     PT Diagnosis:    PT Problem List:   PT Treatment Interventions:     PT Goals (current goals can now be found in the care plan section) Acute Rehab PT Goals Patient Stated Goal: To decrease pain PT Goal Formulation: With patient Time For Goal Achievement: 05/06/13 Potential to Achieve Goals: Good  Visit Information  Last PT Received On: 04/30/13 Assistance Needed: +1 History of Present Illness: L3-4 Gill Procedure with Right Transforaminal Lumbar Interbody Fusion L3-4, Pedicle Screws, rods and cage, local bone graft    Subjective Data  Subjective: "I have been getting up by myself, I don't need help." Patient Stated Goal: To decrease pain   Cognition  Cognition Arousal/Alertness: Lethargic Behavior During Therapy: WFL for tasks assessed/performed Overall Cognitive Status: Within Functional Limits for tasks assessed    Balance     End of Session PT - End of Session Equipment Utilized During Treatment: Gait belt;Back brace Activity Tolerance: Patient limited by pain Patient left: in chair;with call bell/phone within reach;with family/visitor present Nurse Communication: Mobility status  GP     Ruthann Cancer 04/30/2013, 12:01 PM  Ruthann Cancer, PT,  DPT [phone]

## 2013-04-30 NOTE — Progress Notes (Signed)
Occupational Therapy Treatment and Discharge Patient Details Name: [name] MRN: [id number] DOB: 01-29-1961 Today's Date: 04/30/2013 Time: 1478-2956 OT Time Calculation (min): 24 min  OT Assessment / Plan / Recommendation  History of present illness L3-4 Gill Procedure with Right Transforaminal Lumbar Interbody Fusion L3-4, Pedicle Screws, rods and cage, local bone graft   OT comments  [name] is so much better today (she says it is due to her having a migraine all day yesterday). All education completed and pt is now safe to D/C home. No further OT needs, will sign off.  Follow Up Recommendations  Home health OT             Frequency Min 2X/week   Progress towards OT Goals Progress towards OT goals: Goals met/education completed, patient discharged from OT  Plan Discharge plan needs to be updated    Precautions / Restrictions Precautions Precautions: Back;Fall Precaution Comments: Pt able to recall 3/3 back precautions.  Required Braces or Orthoses: Spinal Brace Spinal Brace: Applied in sitting position Restrictions Weight Bearing Restrictions: No   Pertinent Vitals/Pain Sore not really painful per pt--back    ADL  Equipment Used: Rolling walker;Back brace Transfers/Ambulation Related to ADLs: Mod I with RW and brace ADL Comments: Pt able to cross her legs to get to her feet for LBADLs. Donns/Doffs brace Independently. Pt aware she needs to side step into her shower at home not forward step--she does not have a shower seat and says she cannot afford one so I recommended to her that she wait until she feels likes she has enough energy to do a shower standing up and that her daughter A her prn.      OT Goals(current goals can now be found in the care plan section) Acute Rehab OT Goals Patient Stated Goal: To decrease pain  Visit Information  Last OT Received On: 04/30/13 Assistance Needed: +1 History of Present Illness: L3-4 Gill Procedure with Right  Transforaminal Lumbar Interbody Fusion L3-4, Pedicle Screws, rods and cage, local bone graft          Cognition  Cognition Arousal/Alertness: Awake/alert Behavior During Therapy: WFL for tasks assessed/performed Overall Cognitive Status: Within Functional Limits for tasks assessed    Mobility  Bed Mobility Bed Mobility: Sit to Sidelying Left Rolling Right: 5: Supervision;With rail Right Sidelying to Sit: 5: Supervision;With rails Sitting - Scoot to Edge of Bed: 5: Supervision Sit to Sidelying Left: 6: Modified independent (Device/Increase time);With rail;HOB flat Details for Bed Mobility Assistance: VC's to maintain back precautions, as pt was slightly twisting when rolling to the side. Transfers Transfers: Sit to Stand;Stand to Sit Sit to Stand: 6: Modified independent (Device/Increase time);With upper extremity assist;From bed Stand to Sit: 6: Modified independent (Device/Increase time);With upper extremity assist;To bed Details for Transfer Assistance: Pt demonstrated proper hand placement and safety awareness.          End of Session OT - End of Session Equipment Utilized During Treatment: Rolling walker;Back brace Activity Tolerance: Patient tolerated treatment well Patient left: in bed;with call bell/phone within reach;with nursing/sitter in room       Evette Georges [phone] 04/30/2013, 2:14 PM

## 2013-04-30 NOTE — Progress Notes (Signed)
Pt complaining of nausea and vomiting throughout shift. Pt states this is d/t to migraines. Pt states that the only effective remedy is Excedrin Migraine. Per pharmacist's note, Excedrin Migraine is not available. Pt made aware of this. Offered cold compress. Pt states this has helped. Will continue to monitor.   Horton Marshall, RN 04/30/2013 3:56 AM

## 2013-04-30 NOTE — Progress Notes (Signed)
Patient ID: [name], female   DOB: 01-Nov-1960, 52 y.o.   MRN: [id number] Subjective: 2 Days Post-Op Procedure(s) (LRB): L3-4 Gill Procedure with Right Transforaminal Lumbar Interbody Fusion L3-4, Pedicle Screws, rods and cage, local bone graft (N/A) Awake, alert and oriented x 4. Less nausea until onset of migraine head ache. Uses excederin migraine strength normally for migraine. PT ambulated in room yesterday.  Patient reports pain as mild.    Objective:   VITALS:  Temp:  [97.8 F (36.6 C)-99.1 F (37.3 C)] 98.5 F (36.9 C) (12/24 2038) Pulse Rate:  [72-104] 104 (12/24 2038) Resp:  [17-18] 18 (12/24 2038) BP: (100-135)/(60-70) 121/67 mmHg (12/24 2038) SpO2:  [96 %-99 %] 96 % (12/24 2038)  Neurologically intact ABD soft Sensation intact distally Intact pulses distally Incision: no drainage   LABS  Recent Labs  04/29/13 0610  HGB 13.4  WBC 17.0*  PLT 225    Recent Labs  04/29/13 0610 04/30/13 0502  NA 140 135  K 4.6 3.5  CL 101 93*  CO2 30 34*  BUN 10 8  CREATININE 0.76 0.62  GLUCOSE 170* 148*   No results found for this basename: LABPT, INR,  in the last 72 hours   Assessment/Plan: 2 Days Post-Op Procedure(s) (LRB): L3-4 Gill Procedure with Right Transforaminal Lumbar Interbody Fusion L3-4, Pedicle Screws, rods and cage, local bone graft (N/A) Migraine head ache.  Advance diet Up with therapy Will reassess progress 12/25 as to whether she will be ready for discharge.  Sherell Christoffel E 04/30/2013, 10:03 PM

## 2013-04-30 NOTE — Progress Notes (Signed)
Clinical Social Work Department BRIEF PSYCHOSOCIAL ASSESSMENT 04/30/2013  Patient:  [name],[name]     Account Number:  [id number]     Admit date:  04/28/2013  Clinical Social Worker:  Harless Nakayama  Date/Time:  04/30/2013 11:30 AM  Referred by:  Physician  Date Referred:  04/30/2013 Referred for  SNF Placement   Other Referral:   Interview type:  Patient Other interview type:    PSYCHOSOCIAL DATA Living Status:  FAMILY Admitted from facility:   Level of care:   Primary support name:  Myrtis Hopping [phone] Primary support relationship to patient:  FRIEND Degree of support available:   Pt reports having good support available at home    CURRENT CONCERNS Current Concerns  Post-Acute Placement   Other Concerns:    SOCIAL WORK ASSESSMENT / PLAN CSW spoke with pt about PT recommendation for SNF. Pt stated she would not like to do rehab at a SNF. CSW inquired as to who pt lives with and if she feels she has enough support to manage at home. Pt reports that her 51 year old daughter and a friend live in her home and that they would definitely be able to manage. CSW informed CM and asked to please evaluate for [facility] at discharge. CSW signing off at this time.   Assessment/plan status:  Psychosocial Support/Ongoing Assessment of Needs Other assessment/ plan:   Information/referral to community resources:   Refused SNF List    PATIENT'S/FAMILY'S RESPONSE TO PLAN OF CARE: Pt is not agreeable to SNF and wants to return home       Kaiah Hosea, LCSWA [phone]

## 2013-05-01 MED ORDER — PROMETHAZINE HCL 12.5 MG PO TABS
12.5000 mg | ORAL_TABLET | Freq: Four times a day (QID) | ORAL | Status: DC | PRN
Start: 2013-05-01 — End: 2015-07-04

## 2013-05-01 MED ORDER — METHOCARBAMOL 500 MG PO TABS: 500.0000 mg | ORAL_TABLET | Freq: Four times a day (QID) | ORAL | Status: DC | PRN

## 2013-05-01 MED ORDER — HYDROCODONE-ACETAMINOPHEN 5-325 MG PO TABS: 1.0000 | ORAL_TABLET | ORAL | Status: DC | PRN

## 2013-05-01 NOTE — Progress Notes (Signed)
   CARE MANAGEMENT NOTE 05/01/2013  Patient:  [name],[name]   Account Number:  [id number]  Date Initiated:  04/29/2013  Documentation initiated by:  Vance Peper  Subjective/Objective Assessment:   52 yr old female s/p L3-L4 lumbar fusion     Action/Plan:   PT reccommending SNF. Will follow   Anticipated DC Date:  05/01/2013   Anticipated DC Plan:  HOME W HOME HEALTH SERVICES      DC Planning Services  CM consult      [facility] Choice  HOME HEALTH   Choice offered to / List presented to:  C-1 Patient   DME arranged  3-N-1  Levan Hurst      DME agency  [facility]     HH arranged  HH-2 PT  HH-3 OT      [facility] agency  [facility]   Status of service:  Completed, signed off Medicare Important Message given?   (If response is "NO", the following Medicare IM given date fields will be blank) Date Medicare IM given:   Date Additional Medicare IM given:    Discharge Disposition:  HOME W HOME HEALTH SERVICES  Per UR Regulation:    If discussed at Long Length of Stay Meetings, dates discussed:    Comments:  05/01/2013 1000 NCM spoke to pt and DME will be shipped to home. Faxed orders to [facility] for [facility] for scheduled soc within 24-48 hours. Isidoro Donning RN CCM Case Mgmt phone [phone]

## 2013-05-06 NOTE — Discharge Summary (Signed)
Physician Discharge Summary  Patient ID: [name] MRN: [id number] DOB/AGE: 10/30/1960 52 y.o.  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 Arthritis   . Migraines   . Hypertension   . High cholesterol   . Lymphocytic colitis   . TIA (transient ischemic attack)   . Anxiety   . Chronic headaches   . Depression   . Internal hemorrhoids   . Menometrorrhagia   . Cystocele   . Complication of anesthesia     Hx: combativeness as child  . PONV (postoperative nausea and vomiting)   . Eczema     Hx; of  . H/O impetigo     Hx: of    Surgeries: Procedure(s): L3-4 Gill Procedure with Right Transforaminal Lumbar Interbody Fusion L3-4, Pedicle Screws, rods and cage, local bone graft on 04/28/2013   Consultants (if any): Treatment Team:  Kerrin Champagne, MD  Discharged Condition: Improved  Hospital Course: [name] is an 52 y.o. female who was admitted 04/28/2013 with a diagnosis of Spinal stenosis, lumbar region, with neurogenic claudication and went to the operating room on 04/28/2013 and underwent the above named procedures.    She was given perioperative antibiotics:  Anti-infectives   Start     Dose/Rate Route Frequency Ordered Stop   04/28/13 1600  ceFAZolin (ANCEF) IVPB 1 g/50 mL premix     1 g 100 mL/hr over 30 Minutes Intravenous Every 8 hours 04/28/13 1458 04/29/13 0124   04/28/13 0600  ceFAZolin (ANCEF) IVPB 2 g/50 mL premix     2 g 100 mL/hr over 30 Minutes Intravenous On call to O.R. 04/27/13 1447 04/28/13 0745    Patient had difficulty with pain control and required large doses of narcotic medication which caused her to have  an episode of respiratory depression treated successfully with O2 and decrease in meds.  She developed migraine headache treated successfully.  Once she was stable she was able to mobilize better with PT and then discharged to home with HHPT arranged.  She was given sequential compression devices, early ambulation for DVT prophylaxis.  She benefited maximally from the hospital stay and there were no complications.    Recent vital signs:  Filed Vitals:   05/01/13 0519  BP: 93/58  Pulse: 97  Temp: 98.4 F (36.9 C)  Resp: 18    Recent laboratory studies:  Lab Results  Component Value Date   HGB 13.4 04/29/2013   HGB 16.5* 04/18/2013   HGB 16.1* 10/11/2012   Lab Results  Component Value Date   WBC 17.0* 04/29/2013   PLT 225 04/29/2013   Lab Results  Component Value Date   INR 0.92 08/08/2011   Lab Results  Component Value Date   NA 135 04/30/2013   K 3.5 04/30/2013   CL 93* 04/30/2013   CO2 34* 04/30/2013   BUN 8 04/30/2013   CREATININE 0.62 04/30/2013   GLUCOSE 148* 04/30/2013    Discharge Medications:     Medication List         amLODipine 5 MG tablet  Commonly known as:  NORVASC  Take 5 mg by mouth at bedtime.     aspirin-acetaminophen-caffeine 250-250-65 MG per tablet  Commonly known as:  EXCEDRIN MIGRAINE  Take  1 tablet by mouth every 6 (six) hours as needed. For headache     budesonide 3 MG 24 hr capsule  Commonly known as:  ENTOCORT EC  Take 9 mg by mouth at bedtime.     HYDROcodone-acetaminophen 5-325 MG per tablet  Commonly known as:  NORCO/VICODIN  Take 1-2 tablets by mouth every 4 (four) hours as needed for moderate pain.     lisinopril-hydrochlorothiazide 20-12.5 MG per tablet  Commonly known as:  PRINZIDE,ZESTORETIC  Take 1 tablet by mouth at bedtime.     methocarbamol 500 MG tablet  Commonly known as:  ROBAXIN  Take 1 tablet (500 mg total) by mouth every 6 (six) hours as needed for muscle spasms.     nortriptyline 10 MG capsule  Commonly  known as:  PAMELOR  Take 10 mg by mouth at bedtime.     pregabalin 75 MG capsule  Commonly known as:  LYRICA  Take 150 mg by mouth 2 (two) times daily.     promethazine 12.5 MG tablet  Commonly known as:  PHENERGAN  Take 1 tablet (12.5 mg total) by mouth every 6 (six) hours as needed for nausea or vomiting.        Diagnostic Studies: Dg Lumbar Spine Complete  04/28/2013   CLINICAL DATA:  L3-4 fusion.  EXAM: LUMBAR SPINE - COMPLETE 4+ VIEW; DG C-ARM 61-120 MIN  COMPARISON:  None.  FINDINGS: Multiple intraoperative spot images demonstrate changes of posterior fusion at L3-4. Normal alignment. No hardware or bony complicating feature.  IMPRESSION: L3-4 posterior fusion.   Electronically Signed   By: Charlett Nose M.D.   On: 04/28/2013 14:00   Dg C-arm 61-120 Min  04/28/2013   CLINICAL DATA:  L3-4 fusion.  EXAM: LUMBAR SPINE - COMPLETE 4+ VIEW; DG C-ARM 61-120 MIN  COMPARISON:  None.  FINDINGS: Multiple intraoperative spot images demonstrate changes of posterior fusion at L3-4. Normal alignment. No hardware or bony complicating feature.  IMPRESSION: L3-4 posterior fusion.   Electronically Signed   By: Charlett Nose M.D.   On: 04/28/2013 14:00    Disposition: 01-Home or Self Care DISCHARGE INSTRUCTIONS GIVEN TO PATIENT: Call if there is increasing drainage, fever greater than 101.5, severe head aches, and worsening nausea or light sensitivity. If shortness of breath, bloody cough or chest tightness or pain go to an emergency room. No lifting greater than 10 lbs. Avoid bending, stooping and twisting. Use brace when sitting and out of bed even to go to bathroom. Walk in house for first 2 weeks then may start to get out slowly increasing distances up to one mile by 4-6 weeks post op. After 5 days may shower and change dressing following bathing with shower.When bathing remove the brace shower and replace brace before getting out of the shower. If drainage, keep dry dressing and do not bathe  the incision, use an moisture impervious dressing. Please call and return for scheduled follow up appointment 2 weeks from the time of surgery.       Follow-up Information   Follow up with NITKA,JAMES E, MD In 2 weeks.   Specialty:  Orthopedic Surgery   Contact information:   [address] [place] [address] [phone]       Follow up with [facility]. [facility])    Contact information:   [address] [place] [address] [phone]        Signed: Wende Neighbors 05/06/2013, 11:07 AM

## 2013-05-07 NOTE — Discharge Summary (Signed)
Patient d/c summary note and lab reviewed.  

## 2013-06-27 ENCOUNTER — Encounter: Payer: Self-pay | Admitting: Gastroenterology

## 2014-01-27 ENCOUNTER — Encounter: Payer: Self-pay | Admitting: Gastroenterology

## 2015-06-04 ENCOUNTER — Encounter (HOSPITAL_COMMUNITY): Payer: Self-pay | Admitting: Emergency Medicine

## 2015-06-04 ENCOUNTER — Emergency Department (HOSPITAL_COMMUNITY)
Admission: EM | Admit: 2015-06-04 | Discharge: 2015-06-04 | Disposition: A | Discharge status: Home / Self Care | Payer: Managed Care, Other (non HMO) | Attending: Emergency Medicine | Admitting: Emergency Medicine

## 2015-06-04 DIAGNOSIS — I10 Essential (primary) hypertension: Secondary | ICD-10-CM | POA: Insufficient documentation

## 2015-06-04 DIAGNOSIS — Z8639 Personal history of other endocrine, nutritional and metabolic disease: Secondary | ICD-10-CM | POA: Diagnosis not present

## 2015-06-04 DIAGNOSIS — Z872 Personal history of diseases of the skin and subcutaneous tissue: Secondary | ICD-10-CM | POA: Diagnosis not present

## 2015-06-04 DIAGNOSIS — M25552 Pain in left hip: Secondary | ICD-10-CM | POA: Insufficient documentation

## 2015-06-04 DIAGNOSIS — Z91040 Latex allergy status: Secondary | ICD-10-CM | POA: Diagnosis not present

## 2015-06-04 DIAGNOSIS — Z8659 Personal history of other mental and behavioral disorders: Secondary | ICD-10-CM | POA: Insufficient documentation

## 2015-06-04 DIAGNOSIS — F17210 Nicotine dependence, cigarettes, uncomplicated: Secondary | ICD-10-CM | POA: Diagnosis not present

## 2015-06-04 DIAGNOSIS — M79604 Pain in right leg: Secondary | ICD-10-CM | POA: Diagnosis not present

## 2015-06-04 DIAGNOSIS — M1990 Unspecified osteoarthritis, unspecified site: Secondary | ICD-10-CM | POA: Insufficient documentation

## 2015-06-04 DIAGNOSIS — Z8673 Personal history of transient ischemic attack (TIA), and cerebral infarction without residual deficits: Secondary | ICD-10-CM | POA: Insufficient documentation

## 2015-06-04 DIAGNOSIS — Z8742 Personal history of other diseases of the female genital tract: Secondary | ICD-10-CM | POA: Insufficient documentation

## 2015-06-04 DIAGNOSIS — Z79899 Other long term (current) drug therapy: Secondary | ICD-10-CM | POA: Diagnosis not present

## 2015-06-04 DIAGNOSIS — G43909 Migraine, unspecified, not intractable, without status migrainosus: Secondary | ICD-10-CM | POA: Diagnosis not present

## 2015-06-04 DIAGNOSIS — G8929 Other chronic pain: Secondary | ICD-10-CM | POA: Insufficient documentation

## 2015-06-04 DIAGNOSIS — Z8719 Personal history of other diseases of the digestive system: Secondary | ICD-10-CM | POA: Diagnosis not present

## 2015-06-04 DIAGNOSIS — M5431 Sciatica, right side: Secondary | ICD-10-CM | POA: Insufficient documentation

## 2015-06-04 MED ORDER — OXYCODONE-ACETAMINOPHEN 5-325 MG PO TABS
1.0000 | ORAL_TABLET | Freq: Once | ORAL | Status: AC
  Administered 2015-06-04: 1 via ORAL
  Filled 2015-06-04: qty 1

## 2015-06-04 MED ORDER — HYDROCODONE-ACETAMINOPHEN 5-325 MG PO TABS: 1.0000 | ORAL_TABLET | Freq: Four times a day (QID) | ORAL | Status: DC | PRN

## 2015-06-04 MED ORDER — METHOCARBAMOL 500 MG PO TABS: 500.0000 mg | ORAL_TABLET | Freq: Two times a day (BID) | ORAL | Status: DC

## 2015-06-04 MED ORDER — METHOCARBAMOL 500 MG PO TABS
500.0000 mg | ORAL_TABLET | Freq: Once | ORAL | Status: AC
  Administered 2015-06-04: 500 mg via ORAL
  Filled 2015-06-04: qty 1

## 2015-06-04 NOTE — Discharge Instructions (Signed)
Schedule a follow up appointment with your PCP to discuss your high blood pressure. Start taking your blood pressure medications again.   Sciatica With Rehab The sciatic nerve runs from the back down the leg and is responsible for sensation and control of the muscles in the back (posterior) side of the thigh, lower leg, and foot. Sciatica is a condition that is characterized by inflammation of this nerve.  SYMPTOMS   Signs of nerve damage, including numbness and/or weakness along the posterior side of the lower extremity.  Pain in the back of the thigh that may also travel down the leg.  Pain that worsens when sitting for long periods of time.  Occasionally, pain in the back or buttock. CAUSES  Inflammation of the sciatic nerve is the cause of sciatica. The inflammation is due to something irritating the nerve. Common sources of irritation include:  Sitting for long periods of time.  Direct trauma to the nerve.  Arthritis of the spine.  Herniated or ruptured disk.  Slipping of the vertebrae (spondylolisthesis).  Pressure from soft tissues, such as muscles or ligament-like tissue (fascia). RISK INCREASES WITH:  Sports that place pressure or stress on the spine (football or weightlifting).  Poor strength and flexibility.  Failure to warm up properly before activity.  Family history of low back pain or disk disorders.  Previous back injury or surgery.  Poor body mechanics, especially when lifting, or poor posture. PREVENTION   Warm up and stretch properly before activity.  Maintain physical fitness:  Strength, flexibility, and endurance.  Cardiovascular fitness.  Learn and use proper technique, especially with posture and lifting. When possible, have coach correct improper technique.  Avoid activities that place stress on the spine. PROGNOSIS If treated properly, then sciatica usually resolves within 6 weeks. However, occasionally surgery is necessary.  RELATED  COMPLICATIONS   Permanent nerve damage, including pain, numbness, tingle, or weakness.  Chronic back pain.  Risks of surgery: infection, bleeding, nerve damage, or damage to surrounding tissues. TREATMENT Treatment initially involves resting from any activities that aggravate your symptoms. The use of ice and medication may help reduce pain and inflammation. The use of strengthening and stretching exercises may help reduce pain with activity. These exercises may be performed at home or with referral to a therapist. A therapist may recommend further treatments, such as transcutaneous electronic nerve stimulation (TENS) or ultrasound. Your caregiver may recommend corticosteroid injections to help reduce inflammation of the sciatic nerve. If symptoms persist despite non-surgical (conservative) treatment, then surgery may be recommended. MEDICATION  If pain medication is necessary, then nonsteroidal anti-inflammatory medications, such as aspirin and ibuprofen, or other minor pain relievers, such as acetaminophen, are often recommended.  Do not take pain medication for 7 days before surgery.  Prescription pain relievers may be given if deemed necessary by your caregiver. Use only as directed and only as much as you need.  Ointments applied to the skin may be helpful.  Corticosteroid injections may be given by your caregiver. These injections should be reserved for the most serious cases, because they may only be given a certain number of times. HEAT AND COLD  Cold treatment (icing) relieves pain and reduces inflammation. Cold treatment should be applied for 10 to 15 minutes every 2 to 3 hours for inflammation and pain and immediately after any activity that aggravates your symptoms. Use ice packs or massage the area with a piece of ice (ice massage).  Heat treatment may be used prior to performing the stretching  and strengthening activities prescribed by your caregiver, physical therapist, or  athletic trainer. Use a heat pack or soak the injury in warm water. SEEK MEDICAL CARE IF:  Treatment seems to offer no benefit, or the condition worsens.  Any medications produce adverse side effects. EXERCISES  RANGE OF MOTION (ROM) AND STRETCHING EXERCISES - Sciatica Most people with sciatic will find that their symptoms worsen with either excessive bending forward (flexion) or arching at the low back (extension). The exercises which will help resolve your symptoms will focus on the opposite motion. Your physician, physical therapist or athletic trainer will help you determine which exercises will be most helpful to resolve your low back pain. Do not complete any exercises without first consulting with your clinician. Discontinue any exercises which worsen your symptoms until you speak to your clinician. If you have pain, numbness or tingling which travels down into your buttocks, leg or foot, the goal of the therapy is for these symptoms to move closer to your back and eventually resolve. Occasionally, these leg symptoms will get better, but your low back pain may worsen; this is typically an indication of progress in your rehabilitation. Be certain to be very alert to any changes in your symptoms and the activities in which you participated in the 24 hours prior to the change. Sharing this information with your clinician will allow him/her to most efficiently treat your condition. These exercises may help you when beginning to rehabilitate your injury. Your symptoms may resolve with or without further involvement from your physician, physical therapist or athletic trainer. While completing these exercises, remember:   Restoring tissue flexibility helps normal motion to return to the joints. This allows healthier, less painful movement and activity.  An effective stretch should be held for at least 30 seconds.  A stretch should never be painful. You should only feel a gentle lengthening or release  in the stretched tissue. FLEXION RANGE OF MOTION AND STRETCHING EXERCISES: STRETCH - Flexion, Single Knee to Chest   Lie on a firm bed or floor with both legs extended in front of you.  Keeping one leg in contact with the floor, bring your opposite knee to your chest. Hold your leg in place by either grabbing behind your thigh or at your knee.  Pull until you feel a gentle stretch in your low back. Hold __________ seconds.  Slowly release your grasp and repeat the exercise with the opposite side. Repeat __________ times. Complete this exercise __________ times per day.  STRETCH - Flexion, Double Knee to Chest  Lie on a firm bed or floor with both legs extended in front of you.  Keeping one leg in contact with the floor, bring your opposite knee to your chest.  Tense your stomach muscles to support your back and then lift your other knee to your chest. Hold your legs in place by either grabbing behind your thighs or at your knees.  Pull both knees toward your chest until you feel a gentle stretch in your low back. Hold __________ seconds.  Tense your stomach muscles and slowly return one leg at a time to the floor. Repeat __________ times. Complete this exercise __________ times per day.  STRETCH - Low Trunk Rotation   Lie on a firm bed or floor. Keeping your legs in front of you, bend your knees so they are both pointed toward the ceiling and your feet are flat on the floor.  Extend your arms out to the side. This will stabilize your  upper body by keeping your shoulders in contact with the floor.  Gently and slowly drop both knees together to one side until you feel a gentle stretch in your low back. Hold for __________ seconds.  Tense your stomach muscles to support your low back as you bring your knees back to the starting position. Repeat the exercise to the other side. Repeat __________ times. Complete this exercise __________ times per day  EXTENSION RANGE OF MOTION AND  FLEXIBILITY EXERCISES: STRETCH - Extension, Prone on Elbows  Lie on your stomach on the floor, a bed will be too soft. Place your palms about shoulder width apart and at the height of your head.  Place your elbows under your shoulders. If this is too painful, stack pillows under your chest.  Allow your body to relax so that your hips drop lower and make contact more completely with the floor.  Hold this position for __________ seconds.  Slowly return to lying flat on the floor. Repeat __________ times. Complete this exercise __________ times per day.  RANGE OF MOTION - Extension, Prone Press Ups  Lie on your stomach on the floor, a bed will be too soft. Place your palms about shoulder width apart and at the height of your head.  Keeping your back as relaxed as possible, slowly straighten your elbows while keeping your hips on the floor. You may adjust the placement of your hands to maximize your comfort. As you gain motion, your hands will come more underneath your shoulders.  Hold this position __________ seconds.  Slowly return to lying flat on the floor. Repeat __________ times. Complete this exercise __________ times per day.  STRENGTHENING EXERCISES - Sciatica  These exercises may help you when beginning to rehabilitate your injury. These exercises should be done near your "sweet spot." This is the neutral, low-back arch, somewhere between fully rounded and fully arched, that is your least painful position. When performed in this safe range of motion, these exercises can be used for people who have either a flexion or extension based injury. These exercises may resolve your symptoms with or without further involvement from your physician, physical therapist or athletic trainer. While completing these exercises, remember:   Muscles can gain both the endurance and the strength needed for everyday activities through controlled exercises.  Complete these exercises as instructed by your  physician, physical therapist or athletic trainer. Progress with the resistance and repetition exercises only as your caregiver advises.  You may experience muscle soreness or fatigue, but the pain or discomfort you are trying to eliminate should never worsen during these exercises. If this pain does worsen, stop and make certain you are following the directions exactly. If the pain is still present after adjustments, discontinue the exercise until you can discuss the trouble with your clinician. STRENGTHENING - Deep Abdominals, Pelvic Tilt   Lie on a firm bed or floor. Keeping your legs in front of you, bend your knees so they are both pointed toward the ceiling and your feet are flat on the floor.  Tense your lower abdominal muscles to press your low back into the floor. This motion will rotate your pelvis so that your tail bone is scooping upwards rather than pointing at your feet or into the floor.  With a gentle tension and even breathing, hold this position for __________ seconds. Repeat __________ times. Complete this exercise __________ times per day.  STRENGTHENING - Abdominals, Crunches   Lie on a firm bed or floor. Keeping your  legs in front of you, bend your knees so they are both pointed toward the ceiling and your feet are flat on the floor. Cross your arms over your chest.  Slightly tip your chin down without bending your neck.  Tense your abdominals and slowly lift your trunk high enough to just clear your shoulder blades. Lifting higher can put excessive stress on the low back and does not further strengthen your abdominal muscles.  Control your return to the starting position. Repeat __________ times. Complete this exercise __________ times per day.  STRENGTHENING - Quadruped, Opposite UE/LE Lift  Assume a hands and knees position on a firm surface. Keep your hands under your shoulders and your knees under your hips. You may place padding under your knees for comfort.  Find  your neutral spine and gently tense your abdominal muscles so that you can maintain this position. Your shoulders and hips should form a rectangle that is parallel with the floor and is not twisted.  Keeping your trunk steady, lift your right hand no higher than your shoulder and then your left leg no higher than your hip. Make sure you are not holding your breath. Hold this position __________ seconds.  Continuing to keep your abdominal muscles tense and your back steady, slowly return to your starting position. Repeat with the opposite arm and leg. Repeat __________ times. Complete this exercise __________ times per day.  STRENGTHENING - Abdominals and Quadriceps, Straight Leg Raise   Lie on a firm bed or floor with both legs extended in front of you.  Keeping one leg in contact with the floor, bend the other knee so that your foot can rest flat on the floor.  Find your neutral spine, and tense your abdominal muscles to maintain your spinal position throughout the exercise.  Slowly lift your straight leg off the floor about 6 inches for a count of 15, making sure to not hold your breath.  Still keeping your neutral spine, slowly lower your leg all the way to the floor. Repeat this exercise with each leg __________ times. Complete this exercise __________ times per day. POSTURE AND BODY MECHANICS CONSIDERATIONS - Sciatica Keeping correct posture when sitting, standing or completing your activities will reduce the stress put on different body tissues, allowing injured tissues a chance to heal and limiting painful experiences. The following are general guidelines for improved posture. Your physician or physical therapist will provide you with any instructions specific to your needs. While reading these guidelines, remember:  The exercises prescribed by your provider will help you have the flexibility and strength to maintain correct postures.  The correct posture provides the optimal  environment for your joints to work. All of your joints have less wear and tear when properly supported by a spine with good posture. This means you will experience a healthier, less painful body.  Correct posture must be practiced with all of your activities, especially prolonged sitting and standing. Correct posture is as important when doing repetitive low-stress activities (typing) as it is when doing a single heavy-load activity (lifting). RESTING POSITIONS Consider which positions are most painful for you when choosing a resting position. If you have pain with flexion-based activities (sitting, bending, stooping, squatting), choose a position that allows you to rest in a less flexed posture. You would want to avoid curling into a fetal position on your side. If your pain worsens with extension-based activities (prolonged standing, working overhead), avoid resting in an extended position such as sleeping on your  stomach. Most people will find more comfort when they rest with their spine in a more neutral position, neither too rounded nor too arched. Lying on a non-sagging bed on your side with a pillow between your knees, or on your back with a pillow under your knees will often provide some relief. Keep in mind, being in any one position for a prolonged period of time, no matter how correct your posture, can still lead to stiffness. PROPER SITTING POSTURE In order to minimize stress and discomfort on your spine, you must sit with correct posture Sitting with good posture should be effortless for a healthy body. Returning to good posture is a gradual process. Many people can work toward this most comfortably by using various supports until they have the flexibility and strength to maintain this posture on their own. When sitting with proper posture, your ears will fall over your shoulders and your shoulders will fall over your hips. You should use the back of the chair to support your upper back. Your  low back will be in a neutral position, just slightly arched. You may place a small pillow or folded towel at the base of your low back for support.  When working at a desk, create an environment that supports good, upright posture. Without extra support, muscles fatigue and lead to excessive strain on joints and other tissues. Keep these recommendations in mind: CHAIR:   A chair should be able to slide under your desk when your back makes contact with the back of the chair. This allows you to work closely.  The chair's height should allow your eyes to be level with the upper part of your monitor and your hands to be slightly lower than your elbows. BODY POSITION  Your feet should make contact with the floor. If this is not possible, use a foot rest.  Keep your ears over your shoulders. This will reduce stress on your neck and low back. INCORRECT SITTING POSTURES   If you are feeling tired and unable to assume a healthy sitting posture, do not slouch or slump. This puts excessive strain on your back tissues, causing more damage and pain. Healthier options include:  Using more support, like a lumbar pillow.  Switching tasks to something that requires you to be upright or walking.  Talking a brief walk.  Lying down to rest in a neutral-spine position. PROLONGED STANDING WHILE SLIGHTLY LEANING FORWARD  When completing a task that requires you to lean forward while standing in one place for a long time, place either foot up on a stationary 2-4 inch high object to help maintain the best posture. When both feet are on the ground, the low back tends to lose its slight inward curve. If this curve flattens (or becomes too large), then the back and your other joints will experience too much stress, fatigue more quickly and can cause pain.  CORRECT STANDING POSTURES Proper standing posture should be assumed with all daily activities, even if they only take a few moments, like when brushing your teeth.  As in sitting, your ears should fall over your shoulders and your shoulders should fall over your hips. You should keep a slight tension in your abdominal muscles to brace your spine. Your tailbone should point down to the ground, not behind your body, resulting in an over-extended swayback posture.  INCORRECT STANDING POSTURES  Common incorrect standing postures include a forward head, locked knees and/or an excessive swayback. WALKING Walk with an upright posture. Your  ears, shoulders and hips should all line-up. PROLONGED ACTIVITY IN A FLEXED POSITION When completing a task that requires you to bend forward at your waist or lean over a low surface, try to find a way to stabilize 3 of 4 of your limbs. You can place a hand or elbow on your thigh or rest a knee on the surface you are reaching across. This will provide you more stability so that your muscles do not fatigue as quickly. By keeping your knees relaxed, or slightly bent, you will also reduce stress across your low back. CORRECT LIFTING TECHNIQUES DO :   Assume a wide stance. This will provide you more stability and the opportunity to get as close as possible to the object which you are lifting.  Tense your abdominals to brace your spine; then bend at the knees and hips. Keeping your back locked in a neutral-spine position, lift using your leg muscles. Lift with your legs, keeping your back straight.  Test the weight of unknown objects before attempting to lift them.  Try to keep your elbows locked down at your sides in order get the best strength from your shoulders when carrying an object.  Always ask for help when lifting heavy or awkward objects. INCORRECT LIFTING TECHNIQUES DO NOT:   Lock your knees when lifting, even if it is a small object.  Bend and twist. Pivot at your feet or move your feet when needing to change directions.  Assume that you cannot safely pick up a paperclip without proper posture.   This information  is not intended to replace advice given to you by your health care provider. Make sure you discuss any questions you have with your health care provider.   Document Released: 04/24/2005 Document Revised: 09/08/2014 Document Reviewed: 08/06/2008 [employer] Interactive Patient Education [employer].

## 2015-06-04 NOTE — ED Notes (Signed)
Pt reports R hip pain for 2 years after a fall. Recently began having increased R hip pain with radiation down her leg. Pt states muscles feel like they are spasming. Pain worse with movement. Hx of back surgery after fall. No recent injuries. No recent long periods of immobility.

## 2015-06-04 NOTE — ED Provider Notes (Signed)
CSN: [id number]     Arrival date & time 06/04/15  1819 History   First MD Initiated Contact with Patient 06/04/15 1916     Chief Complaint  Patient presents with  . Hip Pain  . Leg Pain    HPI  [name] is a 55 year old female with PMHx of HTN, anxiety, and back pain presenting with right buttock and leg pain. She reports chronic pain in her right leg since a fall 2 years ago. She underwent an L spine surgery in 2014 in an attempt to alleviate her pain. She states this has not helped and the pain has persisted. The pain began worsening over the past week. She denies injuries to the leg or back that may have aggravated the pain. The pain starts in the right buttock and radiates down the side of her leg into her foot. She states that the pain is constant but increases in severity when walking or sitting up straight. She describes it as burning, tingling and sharp. She also reports feeling "knots" in the back of her thigh. She states it feels like the muscles are spasming. She has taken ibuprofen and tylenol without relief. Ice previously provided relief but no longer works. She states that she has not followed up with her orthopedic doctor because "they did me wrong and I refuse to go back". She states she has not seen anyone about her chronic pain. Denies incontinence of bowel or bladder. She is still able to ambulate though it is painful. She does note that she quit taking her blood pressure medicines a few months ago. She has a PCP but has not seen him recently. She has no other complaints.   Past Medical History  Diagnosis Date  . Arthritis   . Migraines   . Hypertension   . High cholesterol   . Lymphocytic colitis   . TIA (transient ischemic attack)   . Anxiety   . Chronic headaches   . Depression   . Internal hemorrhoids   . Menometrorrhagia   . Cystocele   . Complication of anesthesia     Hx: combativeness as child  . PONV (postoperative nausea and vomiting)   . Eczema     Hx;  of  . H/O impetigo     Hx: of   Past Surgical History  Procedure Laterality Date  . Incontinence surgery    . Cesarean section  1989, 1997  . Tonsillectomy    . Colonoscopy      Hx: of  . Tubal ligation     Family History  Problem Relation Age of Onset  . Colon cancer Father     96-65  . Hypertension Father   . Colon polyps    . Diabetes     Social History  Substance Use Topics  . Smoking status: Current Every Day Smoker -- 1.00 packs/day    Types: Cigarettes  . Smokeless tobacco: Never Used  . Alcohol Use: No     Comment: minimal every 6 months   OB History    No data available     Review of Systems  Musculoskeletal: Positive for myalgias and arthralgias.  All other systems reviewed and are negative.     Allergies  Adhesive; Latex; Nsaids; Oxycodone; and Mupirocin  Home Medications   Prior to Admission medications   Medication Sig Start Date End Date Taking? Authorizing Provider  aspirin-acetaminophen-caffeine (EXCEDRIN MIGRAINE) [phone] MG per tablet Take 1 tablet by mouth every 6 (six) hours as  needed for headache or migraine. For headache   Yes Historical Provider, MD  diphenhydrAMINE (SOMINEX) 25 MG tablet Take 25 mg by mouth daily as needed for allergies or sleep.   Yes Historical Provider, MD  ibuprofen (ADVIL,MOTRIN) 200 MG tablet Take 400 mg by mouth every 6 (six) hours as needed for moderate pain.   Yes Historical Provider, MD  amLODipine (NORVASC) 5 MG tablet Take 5 mg by mouth at bedtime.    Historical Provider, MD  HYDROcodone-acetaminophen (NORCO/VICODIN) 5-325 MG per tablet Take 1-2 tablets by mouth every 4 (four) hours as needed for moderate pain. Patient not taking: Reported on 06/04/2015 05/01/13   [name], MD  HYDROcodone-acetaminophen (NORCO/VICODIN) 5-325 MG tablet Take 1 tablet by mouth every 6 (six) hours as needed. 06/04/15   [name], PA-C  methocarbamol (ROBAXIN) 500 MG tablet Take 1 tablet (500 mg total) by mouth every 6  (six) hours as needed for muscle spasms. Patient not taking: Reported on 06/04/2015 05/01/13   [name], MD  methocarbamol (ROBAXIN) 500 MG tablet Take 1 tablet (500 mg total) by mouth 2 (two) times daily. 06/04/15   [name], PA-C  promethazine (PHENERGAN) 12.5 MG tablet Take 1 tablet (12.5 mg total) by mouth every 6 (six) hours as needed for nausea or vomiting. Patient not taking: Reported on 06/04/2015 05/01/13   [name], MD   BP 186/109 mmHg  Pulse 96  Temp(Src) 97.8 F (36.6 C) (Oral)  Resp 20  SpO2 95% Physical Exam  Constitutional: She appears well-developed and well-nourished. No distress.  HENT:  Head: Normocephalic and atraumatic.  Eyes: Conjunctivae are normal. Right eye exhibits no discharge. Left eye exhibits no discharge. No scleral icterus.  Neck: Normal range of motion.  Cardiovascular: Normal rate, regular rhythm and intact distal pulses.   Pedal pulse palpable  Pulmonary/Chest: Effort normal. No respiratory distress.  Musculoskeletal: Normal range of motion.       Left hip: She exhibits tenderness. She exhibits normal range of motion, normal strength and no deformity.       Legs: Pt ambulating around room unassisted upon entering. TTP over right buttock and diffusely over leg. No focal tenderness. FROM of hips, knee, ankle and toes. No swelling, edema or deformity of right extremity. Positive straight leg raise.   Neurological: She is alert. Coordination normal.  5/5 strength of BLE but with moderate pain on testing the right. Sensation to light touch intact throughout.   Skin: Skin is warm and dry.  Psychiatric: She has a normal mood and affect. Her behavior is normal.  Nursing note and vitals reviewed.   ED Course  Procedures (including critical care time) Labs Review Labs Reviewed - No data to display  Imaging Review No results found. I have personally reviewed and evaluated these images and lab results as part of my medical decision-making.    EKG Interpretation None      MDM   Final diagnoses:  Right leg pain  Sciatica of right side   Patient presenting with right buttock and leg pain x 2 years with acute worsening approximately 1 week ago. No hx of recent trauma to the leg.  Afebrile. Non-focal neuro exam. Tenderness to palpation at right buttock and diffusely over lower extremity. FROM intact. Patient is able to ambulate though with some discomfort. No loss of bowel or bladder control. No numbness or weakness in the lower extremities. No concern for cauda equina. No history of IVDU or cancer. Pain improved with percocet  and robaxin in ED. Conservative therapy including back exercises, heat, ice, tylenol or ibuprofen discussed. Will give muscle relaxer for back spasms. Discussed side effects of muscle relaxer and avoiding driving. Pt requesting new orthopedic referral because she had a bad experience with hers. Given Dr. Sid Falcon information. Also discussed that pt had high BP reading today. She admits to noncompliance with her HTN medication. Discussed the importance of these medications and instructed her to restart them. Pt is to schedule follow up appointment with her PCP for BP check in 1 week. Return precautions discussed and given in discharge paperwork. Pt is stable for discharge.      Lahoma Crocker Findlay Dagher, PA-C 06/04/15 2156  Gareth Morgan, MD 06/05/15 [phone]

## 2015-07-04 ENCOUNTER — Encounter (HOSPITAL_COMMUNITY): Payer: Self-pay

## 2015-07-04 ENCOUNTER — Emergency Department (HOSPITAL_COMMUNITY)
Admission: EM | Admit: 2015-07-04 | Discharge: 2015-07-04 | Disposition: A | Discharge status: Home / Self Care | Payer: Managed Care, Other (non HMO) | Attending: Emergency Medicine | Admitting: Emergency Medicine

## 2015-07-04 ENCOUNTER — Emergency Department (HOSPITAL_COMMUNITY): Payer: Managed Care, Other (non HMO)

## 2015-07-04 DIAGNOSIS — F17210 Nicotine dependence, cigarettes, uncomplicated: Secondary | ICD-10-CM | POA: Diagnosis not present

## 2015-07-04 DIAGNOSIS — I10 Essential (primary) hypertension: Secondary | ICD-10-CM | POA: Diagnosis not present

## 2015-07-04 DIAGNOSIS — S0181XA Laceration without foreign body of other part of head, initial encounter: Secondary | ICD-10-CM

## 2015-07-04 DIAGNOSIS — Z872 Personal history of diseases of the skin and subcutaneous tissue: Secondary | ICD-10-CM | POA: Diagnosis not present

## 2015-07-04 DIAGNOSIS — S0990XA Unspecified injury of head, initial encounter: Secondary | ICD-10-CM

## 2015-07-04 DIAGNOSIS — W01198A Fall on same level from slipping, tripping and stumbling with subsequent striking against other object, initial encounter: Secondary | ICD-10-CM | POA: Insufficient documentation

## 2015-07-04 DIAGNOSIS — W19XXXA Unspecified fall, initial encounter: Secondary | ICD-10-CM

## 2015-07-04 DIAGNOSIS — Y998 Other external cause status: Secondary | ICD-10-CM | POA: Diagnosis not present

## 2015-07-04 DIAGNOSIS — Z79899 Other long term (current) drug therapy: Secondary | ICD-10-CM | POA: Diagnosis not present

## 2015-07-04 DIAGNOSIS — Y92003 Bedroom of unspecified non-institutional (private) residence as the place of occurrence of the external cause: Secondary | ICD-10-CM | POA: Diagnosis not present

## 2015-07-04 DIAGNOSIS — Z8742 Personal history of other diseases of the female genital tract: Secondary | ICD-10-CM | POA: Insufficient documentation

## 2015-07-04 DIAGNOSIS — Y9389 Activity, other specified: Secondary | ICD-10-CM | POA: Insufficient documentation

## 2015-07-04 DIAGNOSIS — Z91040 Latex allergy status: Secondary | ICD-10-CM | POA: Diagnosis not present

## 2015-07-04 DIAGNOSIS — Z8639 Personal history of other endocrine, nutritional and metabolic disease: Secondary | ICD-10-CM | POA: Insufficient documentation

## 2015-07-04 DIAGNOSIS — Z8659 Personal history of other mental and behavioral disorders: Secondary | ICD-10-CM | POA: Insufficient documentation

## 2015-07-04 DIAGNOSIS — Z8673 Personal history of transient ischemic attack (TIA), and cerebral infarction without residual deficits: Secondary | ICD-10-CM | POA: Diagnosis not present

## 2015-07-04 DIAGNOSIS — M1990 Unspecified osteoarthritis, unspecified site: Secondary | ICD-10-CM | POA: Diagnosis not present

## 2015-07-04 DIAGNOSIS — G43909 Migraine, unspecified, not intractable, without status migrainosus: Secondary | ICD-10-CM | POA: Diagnosis not present

## 2015-07-04 DIAGNOSIS — Z8719 Personal history of other diseases of the digestive system: Secondary | ICD-10-CM | POA: Insufficient documentation

## 2015-07-04 DIAGNOSIS — G8929 Other chronic pain: Secondary | ICD-10-CM | POA: Diagnosis not present

## 2015-07-04 MED ORDER — BACITRACIN ZINC 500 UNIT/GM EX OINT: TOPICAL_OINTMENT | Freq: Two times a day (BID) | CUTANEOUS | Status: DC

## 2015-07-04 MED ORDER — BACITRACIN ZINC 500 UNIT/GM EX OINT
TOPICAL_OINTMENT | CUTANEOUS | Status: AC
Start: 2015-07-04 — End: 2015-07-04
  Administered 2015-07-04: 1
  Filled 2015-07-04: qty 0.9

## 2015-07-04 NOTE — ED Provider Notes (Signed)
CSN: [id number]     Arrival date & time 07/04/15  [id number] History   First MD Initiated Contact with Patient 07/04/15 [phone]     Chief Complaint  Patient presents with  . Fall  . Head Injury     (Consider location/radiation/quality/duration/timing/severity/associated sxs/prior Treatment) HPI Comments: Patient is a 55 year old female with history of hypertension, depression. She presents for evaluation of a head injury. She was walking to the bathroom in the night when she tripped and fell forward striking her head on the footboard of the bed. She denies being knocked out, but does report feeling dazed. She has a small laceration to the forehead which has been controlled with direct pressure. This morning in the shower and began bleeding a can and she continues with headache. She denies any neck pain. She denies any numbness or tingling. She denies any visual disturbances.  Patient is a 55 y.o. female presenting with head injury. The history is provided by the patient.  Head Injury Location:  Frontal Time since incident:  6 hours Mechanism of injury: fall   Pain details:    Quality:  Dull   Severity:  Moderate   Timing:  Constant   Progression:  Unchanged Chronicity:  New Relieved by:  Nothing Worsened by:  Nothing tried Ineffective treatments:  None tried Associated symptoms: headache   Associated symptoms: no loss of consciousness, no neck pain and no numbness     Past Medical History  Diagnosis Date  . Arthritis   . Migraines   . Hypertension   . High cholesterol   . Lymphocytic colitis   . TIA (transient ischemic attack)   . Anxiety   . Chronic headaches   . Depression   . Internal hemorrhoids   . Menometrorrhagia   . Cystocele   . Complication of anesthesia     Hx: combativeness as child  . PONV (postoperative nausea and vomiting)   . Eczema     Hx; of  . H/O impetigo     Hx: of   Past Surgical History  Procedure Laterality Date  . Incontinence surgery    .  Cesarean section  1989, 1997  . Tonsillectomy    . Colonoscopy      Hx: of  . Tubal ligation     Family History  Problem Relation Age of Onset  . Colon cancer Father     17-65  . Hypertension Father   . Colon polyps    . Diabetes     Social History  Substance Use Topics  . Smoking status: Current Every Day Smoker -- 1.00 packs/day    Types: Cigarettes  . Smokeless tobacco: Never Used  . Alcohol Use: No     Comment: minimal every 6 months   OB History    No data available     Review of Systems  Musculoskeletal: Negative for neck pain.  Neurological: Positive for headaches. Negative for loss of consciousness and numbness.  All other systems reviewed and are negative.     Allergies  Adhesive; Latex; Nsaids; Oxycodone; and Mupirocin  Home Medications   Prior to Admission medications   Medication Sig Start Date End Date Taking? Authorizing Provider  amLODipine (NORVASC) 5 MG tablet Take 5 mg by mouth at bedtime.    Historical Provider, MD  aspirin-acetaminophen-caffeine (EXCEDRIN MIGRAINE) [phone] MG per tablet Take 1 tablet by mouth every 6 (six) hours as needed for headache or migraine. For headache    Historical Provider, MD  diphenhydrAMINE [facility]) 25  MG tablet Take 25 mg by mouth daily as needed for allergies or sleep.    Historical Provider, MD  HYDROcodone-acetaminophen (NORCO/VICODIN) 5-325 MG per tablet Take 1-2 tablets by mouth every 4 (four) hours as needed for moderate pain. Patient not taking: Reported on 06/04/2015 05/01/13   [name], MD  HYDROcodone-acetaminophen (NORCO/VICODIN) 5-325 MG tablet Take 1 tablet by mouth every 6 (six) hours as needed. 06/04/15   Stevi Barrett, PA-C  ibuprofen (ADVIL,MOTRIN) 200 MG tablet Take 400 mg by mouth every 6 (six) hours as needed for moderate pain.    Historical Provider, MD  methocarbamol (ROBAXIN) 500 MG tablet Take 1 tablet (500 mg total) by mouth every 6 (six) hours as needed for muscle spasms. Patient not  taking: Reported on 06/04/2015 05/01/13   [name], MD  methocarbamol (ROBAXIN) 500 MG tablet Take 1 tablet (500 mg total) by mouth 2 (two) times daily. 06/04/15   Stevi Barrett, PA-C  promethazine (PHENERGAN) 12.5 MG tablet Take 1 tablet (12.5 mg total) by mouth every 6 (six) hours as needed for nausea or vomiting. Patient not taking: Reported on 06/04/2015 05/01/13   [name], MD   BP 155/108 mmHg  Pulse 86  Temp(Src) 98 F (36.7 C) (Oral)  Resp 17  SpO2 96% Physical Exam  Constitutional: She is oriented to person, place, and time. She appears well-developed and well-nourished. No distress.  HENT:  Head: Normocephalic and atraumatic.  There is a 1.5 cm, well approximated laceration to the mid forehead. There is moderate swelling and ecchymosis surrounding this laceration and extending to above the right supraorbital ridge. There is no palpable defect.  TMs are clear without hemotympanum. There is no facial instability and no battle sign.  Eyes: EOM are normal. Pupils are equal, round, and reactive to light.  Neck: Normal range of motion. Neck supple.  There is no bony tenderness of the cervical spine. She has full range of motion in all directions.  Cardiovascular: Normal rate and regular rhythm.  Exam reveals no gallop and no friction rub.   No murmur heard. Pulmonary/Chest: Effort normal and breath sounds normal. No respiratory distress. She has no wheezes.  Abdominal: Soft. Bowel sounds are normal. She exhibits no distension. There is no tenderness.  Musculoskeletal: Normal range of motion.  Neurological: She is alert and oriented to person, place, and time. No cranial nerve deficit. She exhibits normal muscle tone. Coordination normal.  Skin: Skin is warm and dry. She is not diaphoretic.  Nursing note and vitals reviewed.   ED Course  Procedures (including critical care time) Labs Review Labs Reviewed - No data to display  Imaging Review No results found. I have  personally reviewed and evaluated these images and lab results as part of my medical decision-making.    MDM   Final diagnoses:  None    Head CT is negative for fracture or bleed and she is neurologically intact. The laceration to her forehead is superficial and appears to be healing well. It is well approximated and I do not feel that suturing would add any cosmetic benefit. She will be advised to keep it covered, apply bacitracin, and follow-up with primary Dr. as needed.    Veryl Speak, MD 07/04/15 [phone]

## 2015-07-04 NOTE — ED Notes (Signed)
Pt here with fall, head injury.  Pt tripped in bedroom and hit bed rail.  Pt is not up to date on her tetanus.  Bleeding controlled.  Placed ice on site immediately following.  Headache present.  No blood thinners.  No LOC.

## 2015-07-04 NOTE — Discharge Instructions (Signed)
Apply bacitracin and dressing changes twice daily.  Ibuprofen 600 mg every 6 hours as needed for pain.  Return to the ER for worsening headache, vomiting, severe dizziness, or other new and concerning symptoms.   Concussion, Adult A concussion, or closed-head injury, is a brain injury caused by a direct blow to the head or by a quick and sudden movement (jolt) of the head or neck. Concussions are usually not life-threatening. Even so, the effects of a concussion can be serious. If you have had a concussion before, you are more likely to experience concussion-like symptoms after a direct blow to the head.  CAUSES  Direct blow to the head, such as from running into another player during a soccer game, being hit in a fight, or hitting your head on a hard surface.  A jolt of the head or neck that causes the brain to move back and forth inside the skull, such as in a car crash. SIGNS AND SYMPTOMS The signs of a concussion can be hard to notice. Early on, they may be missed by you, family members, and health care providers. You may look fine but act or feel differently. Symptoms are usually temporary, but they may last for days, weeks, or even longer. Some symptoms may appear right away while others may not show up for hours or days. Every head injury is different. Symptoms include:  Mild to moderate headaches that will not go away.  A feeling of pressure inside your head.  Having more trouble than usual:  Learning or remembering things you have heard.  Answering questions.  Paying attention or concentrating.  Organizing daily tasks.  Making decisions and solving problems.  Slowness in thinking, acting or reacting, speaking, or reading.  Getting lost or being easily confused.  Feeling tired all the time or lacking energy (fatigued).  Feeling drowsy.  Sleep disturbances.  Sleeping more than usual.  Sleeping less than usual.  Trouble falling asleep.  Trouble sleeping  (insomnia).  Loss of balance or feeling lightheaded or dizzy.  Nausea or vomiting.  Numbness or tingling.  Increased sensitivity to:  Sounds.  Lights.  Distractions.  Vision problems or eyes that tire easily.  Diminished sense of taste or smell.  Ringing in the ears.  Mood changes such as feeling sad or anxious.  Becoming easily irritated or angry for little or no reason.  Lack of motivation.  Seeing or hearing things other people do not see or hear (hallucinations). DIAGNOSIS Your health care provider can usually diagnose a concussion based on a description of your injury and symptoms. He or she will ask whether you passed out (lost consciousness) and whether you are having trouble remembering events that happened right before and during your injury. Your evaluation might include:  A brain scan to look for signs of injury to the brain. Even if the test shows no injury, you may still have a concussion.  Blood tests to be sure other problems are not present. TREATMENT  Concussions are usually treated in an emergency department, in urgent care, or at a clinic. You may need to stay in the hospital overnight for further treatment.  Tell your health care provider if you are taking any medicines, including prescription medicines, over-the-counter medicines, and natural remedies. Some medicines, such as blood thinners (anticoagulants) and aspirin, may increase the chance of complications. Also tell your health care provider whether you have had alcohol or are taking illegal drugs. This information may affect treatment.  Your health care provider  will send you home with important instructions to follow.  How fast you will recover from a concussion depends on many factors. These factors include how severe your concussion is, what part of your brain was injured, your age, and how healthy you were before the concussion.  Most people with mild injuries recover fully. Recovery can  take time. In general, recovery is slower in older persons. Also, persons who have had a concussion in the past or have other medical problems may find that it takes longer to recover from their current injury. HOME CARE INSTRUCTIONS General Instructions  Carefully follow the directions your health care provider gave you.  Only take over-the-counter or prescription medicines for pain, discomfort, or fever as directed by your health care provider.  Take only those medicines that your health care provider has approved.  Do not drink alcohol until your health care provider says you are well enough to do so. Alcohol and certain other drugs may slow your recovery and can put you at risk of further injury.  If it is harder than usual to remember things, write them down.  If you are easily distracted, try to do one thing at a time. For example, do not try to watch TV while fixing dinner.  Talk with family members or close friends when making important decisions.  Keep all follow-up appointments. Repeated evaluation of your symptoms is recommended for your recovery.  Watch your symptoms and tell others to do the same. Complications sometimes occur after a concussion. Older adults with a brain injury may have a higher risk of serious complications, such as a blood clot on the brain.  Tell your teachers, school nurse, school counselor, coach, athletic trainer, or work Freight forwarder about your injury, symptoms, and restrictions. Tell them about what you can or cannot do. They should watch for:  Increased problems with attention or concentration.  Increased difficulty remembering or learning new information.  Increased time needed to complete tasks or assignments.  Increased irritability or decreased ability to cope with stress.  Increased symptoms.  Rest. Rest helps the brain to heal. Make sure you:  Get plenty of sleep at night. Avoid staying up late at night.  Keep the same bedtime hours on  weekends and weekdays.  Rest during the day. Take daytime naps or rest breaks when you feel tired.  Limit activities that require a lot of thought or concentration. These include:  Doing homework or job-related work.  Watching TV.  Working on the computer.  Avoid any situation where there is potential for another head injury (football, hockey, soccer, basketball, martial arts, downhill snow sports and horseback riding). Your condition will get worse every time you experience a concussion. You should avoid these activities until you are evaluated by the appropriate follow-up health care providers. Returning To Your Regular Activities You will need to return to your normal activities slowly, not all at once. You must give your body and brain enough time for recovery.  Do not return to sports or other athletic activities until your health care provider tells you it is safe to do so.  Ask your health care provider when you can drive, ride a bicycle, or operate heavy machinery. Your ability to react may be slower after a brain injury. Never do these activities if you are dizzy.  Ask your health care provider about when you can return to work or school. Preventing Another Concussion It is very important to avoid another brain injury, especially before you have  recovered. In rare cases, another injury can lead to permanent brain damage, brain swelling, or death. The risk of this is greatest during the first 7-10 days after a head injury. Avoid injuries by:  Wearing a seat belt when riding in a car.  Drinking alcohol only in moderation.  Wearing a helmet when biking, skiing, skateboarding, skating, or doing similar activities.  Avoiding activities that could lead to a second concussion, such as contact or recreational sports, until your health care provider says it is okay.  Taking safety measures in your home.  Remove clutter and tripping hazards from floors and stairways.  Use grab bars  in bathrooms and handrails by stairs.  Place non-slip mats on floors and in bathtubs.  Improve lighting in dim areas. SEEK MEDICAL CARE IF:  You have increased problems paying attention or concentrating.  You have increased difficulty remembering or learning new information.  You need more time to complete tasks or assignments than before.  You have increased irritability or decreased ability to cope with stress.  You have more symptoms than before. Seek medical care if you have any of the following symptoms for more than 2 weeks after your injury:  Lasting (chronic) headaches.  Dizziness or balance problems.  Nausea.  Vision problems.  Increased sensitivity to noise or light.  Depression or mood swings.  Anxiety or irritability.  Memory problems.  Difficulty concentrating or paying attention.  Sleep problems.  Feeling tired all the time. SEEK IMMEDIATE MEDICAL CARE IF:  You have severe or worsening headaches. These may be a sign of a blood clot in the brain.  You have weakness (even if only in one hand, leg, or part of the face).  You have numbness.  You have decreased coordination.  You vomit repeatedly.  You have increased sleepiness.  One pupil is larger than the other.  You have convulsions.  You have slurred speech.  You have increased confusion. This may be a sign of a blood clot in the brain.  You have increased restlessness, agitation, or irritability.  You are unable to recognize people or places.  You have neck pain.  It is difficult to wake you up.  You have unusual behavior changes.  You lose consciousness. MAKE SURE YOU:  Understand these instructions.  Will watch your condition.  Will get help right away if you are not doing well or get worse.   This information is not intended to replace advice given to you by your health care provider. Make sure you discuss any questions you have with your health care provider.     Document Released: 07/15/2003 Document Revised: 05/15/2014 Document Reviewed: 11/14/2012 Elsevier Interactive Patient Education 2016 Else[employer]  Facial Laceration A facial laceration is a cut on the face. These injuries can be painful and cause bleeding. Some cuts may need to be closed with stitches (sutures), skin adhesive strips, or wound glue. Cuts usually heal quickly but can leave a scar. It can take 1-2 years for the scar to go away completely. HOME CARE   Only take medicines as told by your doctor.  Follow your doctor's instructions for wound care. For Stitches:  Keep the cut clean and dry.  If you have a bandage (dressing), change it at least once a day. Change the bandage if it gets wet or dirty, or as told by your doctor.  Wash the cut with soap and water 2 times a day. Rinse the cut with water. Pat it dry with a clean  towel.  Put a thin layer of medicated cream on the cut as told by your doctor.  You may shower after the first 24 hours. Do not soak the cut in water until the stitches are removed.  Have your stitches removed as told by your doctor.  Do not wear any makeup until a few days after your stitches are removed. For Skin Adhesive Strips:  Keep the cut clean and dry.  Do not get the strips wet. You may take a bath, but be careful to keep the cut dry.  If the cut gets wet, pat it dry with a clean towel.  The strips will fall off on their own. Do not remove the strips that are still stuck to the cut. For Wound Glue:  You may shower or take baths. Do not soak or scrub the cut. Do not swim. Avoid heavy sweating until the glue falls off on its own. After a shower or bath, pat the cut dry with a clean towel.  Do not put medicine or makeup on your cut until the glue falls off.  If you have a bandage, do not put tape over the glue.  Avoid lots of sunlight or tanning lamps until the glue falls off.  The glue will fall off on its own in 5-10 days. Do not pick  at the glue. After Healing:  Put sunscreen on the cut for the first year to reduce your scar. GET HELP IF:  You have a fever. GET HELP RIGHT AWAY IF:   Your cut area gets red, painful, or puffy (swollen).  You see a yellowish-white fluid (pus) coming from the cut.   This information is not intended to replace advice given to you by your health care provider. Make sure you discuss any questions you have with your health care provider.   Document Released: 10/11/2007 Document Revised: 05/15/2014 Document Reviewed: 12/05/2012 Elsevier Interactive Patient Education [employer].

## 2015-08-25 ENCOUNTER — Other Ambulatory Visit: Payer: Self-pay | Admitting: Family Medicine

## 2015-08-25 DIAGNOSIS — M5441 Lumbago with sciatica, right side: Secondary | ICD-10-CM

## 2015-11-02 ENCOUNTER — Telehealth (HOSPITAL_COMMUNITY): Payer: Self-pay | Admitting: *Deleted

## 2015-11-02 NOTE — Telephone Encounter (Signed)
Telephoned patient at home # and left message to return call to [facility] 

## 2015-11-18 ENCOUNTER — Other Ambulatory Visit: Payer: Self-pay | Admitting: Obstetrics and Gynecology

## 2015-11-18 DIAGNOSIS — Z1231 Encounter for screening mammogram for malignant neoplasm of breast: Secondary | ICD-10-CM

## 2015-12-15 ENCOUNTER — Telehealth (HOSPITAL_COMMUNITY): Payer: Self-pay | Admitting: *Deleted

## 2015-12-15 NOTE — Telephone Encounter (Signed)
Telephoned patient at home # and left message on voicemail with information about [facility] appointment on Thursday August 10 9:00.

## 2015-12-16 ENCOUNTER — Ambulatory Visit
Admission: RE | Admit: 2015-12-16 | Discharge: 2015-12-16 | Disposition: A | Discharge status: Home / Self Care | Payer: No Typology Code available for payment source | Source: Ambulatory Visit | Attending: Obstetrics and Gynecology | Admitting: Obstetrics and Gynecology

## 2015-12-16 ENCOUNTER — Ambulatory Visit (HOSPITAL_COMMUNITY)
Admission: RE | Admit: 2015-12-16 | Discharge: 2015-12-16 | Disposition: A | Discharge status: Home / Self Care | Payer: Medicaid Other | Source: Ambulatory Visit | Attending: Obstetrics and Gynecology | Admitting: Obstetrics and Gynecology

## 2015-12-16 ENCOUNTER — Other Ambulatory Visit (HOSPITAL_COMMUNITY): Payer: Self-pay | Admitting: *Deleted

## 2015-12-16 ENCOUNTER — Encounter (HOSPITAL_COMMUNITY): Payer: Self-pay

## 2015-12-16 VITALS — BP 120/84 | Temp 98.6°F | Ht 67.5 in | Wt 205.0 lb

## 2015-12-16 DIAGNOSIS — Z124 Encounter for screening for malignant neoplasm of cervix: Secondary | ICD-10-CM | POA: Diagnosis present

## 2015-12-16 DIAGNOSIS — Z01419 Encounter for gynecological examination (general) (routine) without abnormal findings: Secondary | ICD-10-CM

## 2015-12-16 DIAGNOSIS — Z1231 Encounter for screening mammogram for malignant neoplasm of breast: Secondary | ICD-10-CM

## 2015-12-16 NOTE — Addendum Note (Signed)
Encounter addended by: Loletta Parish, RN on: 12/16/2015 12:48 PM<BR>    Actions taken: Follow-up modified

## 2015-12-16 NOTE — Progress Notes (Signed)
No complaints today.   Pap Smear: Pap smear completed today. Last Pap smear was 07/23/2008 and LGSIL.Patient had a colposcopy completed 08/06/2008 that was benign for follow up. Per patient her last Pap smear is the only abnormal Pap smear she has had. Last Pap smear and colposcopy result is in EPIC.  Physical exam: Breasts Breasts symmetrical. No skin abnormalities bilateral breasts. No nipple retraction bilateral breasts. No nipple discharge bilateral breasts. No lymphadenopathy. No lumps palpated bilateral breasts. No complaints of pain or tenderness on exam. Referred patient to the [facility] for a screening mammogram. Appointment scheduled for Thursday, December 16, 2015 at [place].  Pelvic/Bimanual   Ext Genitalia No lesions, no swelling and no discharge observed on external genitalia.         Vagina Vagina pink and normal texture. No lesions or discharge observed in vagina.          Cervix Cervix is present. Cervix pink and of normal texture. No discharge observed.     Uterus Uterus is present and palpable. Uterus in normal position and normal size.        Adnexae Bilateral ovaries present and palpable. No tenderness on palpation.          Rectovaginal No rectal exam completed today since patient had no rectal complaints. No skin abnormalities observed on exam.    Smoking History: Patient has never smoked.  Patient Navigation: Patient education provided. Access to services provided for patient through [facility] program.   Colorectal Cancer Screening: Patient had a colonoscopy completed around 8 years ago. No complaints today.

## 2015-12-16 NOTE — Patient Instructions (Addendum)
Explained self breast awareness to [facility]. Let patient know that if her Pap smear today is normal that her next Pap smear will be due in one year since her last Pap smear was abnormal. Told patient she will need three normal Pap smears in a row before can space out to every 3 years. Referred patient to the [facility] for a screening mammogram. Appointment scheduled for Thursday, December 16, 2015 at [place]. Let patient know will follow up with her within the next couple weeks with results of Pap smear by phone. Informed [facility] that [facility] will [facility] with her within the next couple of weeks with results of mammogram by letter or phone. Chanon Tanimoto verbalized understanding.  Leveta Wahab, Arvil Chaco, RN 10:25 AM

## 2015-12-16 NOTE — Addendum Note (Signed)
Encounter addended by: Armond Hang, LPN on: [id number]<BR>    Actions taken: Order Entry activity accessed

## 2015-12-16 NOTE — Addendum Note (Signed)
Encounter addended by: Loletta Parish, RN on: 12/16/2015  1:19 PM<BR>    Actions taken: Sign clinical note

## 2015-12-20 ENCOUNTER — Other Ambulatory Visit: Payer: Self-pay | Admitting: Obstetrics and Gynecology

## 2015-12-20 ENCOUNTER — Encounter (HOSPITAL_COMMUNITY): Payer: Self-pay | Admitting: *Deleted

## 2015-12-20 DIAGNOSIS — R928 Other abnormal and inconclusive findings on diagnostic imaging of breast: Secondary | ICD-10-CM

## 2015-12-20 LAB — CYTOLOGY - PAP

## 2015-12-23 ENCOUNTER — Other Ambulatory Visit: Payer: Self-pay | Admitting: Obstetrics and Gynecology

## 2015-12-23 ENCOUNTER — Ambulatory Visit
Admission: RE | Admit: 2015-12-23 | Discharge: 2015-12-23 | Disposition: A | Discharge status: Home / Self Care | Payer: No Typology Code available for payment source | Source: Ambulatory Visit | Attending: Obstetrics and Gynecology | Admitting: Obstetrics and Gynecology

## 2015-12-23 DIAGNOSIS — N6320 Unspecified lump in the left breast, unspecified quadrant: Secondary | ICD-10-CM

## 2015-12-23 DIAGNOSIS — R928 Other abnormal and inconclusive findings on diagnostic imaging of breast: Secondary | ICD-10-CM

## 2015-12-29 ENCOUNTER — Other Ambulatory Visit: Payer: Self-pay | Admitting: Obstetrics and Gynecology

## 2015-12-29 ENCOUNTER — Ambulatory Visit
Admission: RE | Admit: 2015-12-29 | Discharge: 2015-12-29 | Disposition: A | Discharge status: Home / Self Care | Payer: No Typology Code available for payment source | Source: Ambulatory Visit | Attending: Obstetrics and Gynecology | Admitting: Obstetrics and Gynecology

## 2015-12-29 DIAGNOSIS — N6320 Unspecified lump in the left breast, unspecified quadrant: Secondary | ICD-10-CM

## 2015-12-31 ENCOUNTER — Telehealth: Payer: Self-pay | Admitting: *Deleted

## 2015-12-31 NOTE — Telephone Encounter (Signed)
Left vm for return call to discuss [facility] on 01/05/16 at 0815. Contact information provided.

## 2016-01-03 ENCOUNTER — Telehealth: Payer: Self-pay | Admitting: *Deleted

## 2016-01-03 ENCOUNTER — Encounter: Payer: Self-pay | Admitting: *Deleted

## 2016-01-03 DIAGNOSIS — C50412 Malignant neoplasm of upper-outer quadrant of left female breast: Secondary | ICD-10-CM

## 2016-01-03 HISTORY — DX: Malignant neoplasm of upper-outer quadrant of left female breast: C50.412

## 2016-01-03 NOTE — Telephone Encounter (Signed)
Confirmed [facility] for 01/05/16 at 0815 .  Instructions and contact information given.

## 2016-01-05 ENCOUNTER — Other Ambulatory Visit: Payer: No Typology Code available for payment source

## 2016-01-05 ENCOUNTER — Ambulatory Visit (HOSPITAL_BASED_OUTPATIENT_CLINIC_OR_DEPARTMENT_OTHER): Payer: Medicaid Other | Admitting: Hematology and Oncology

## 2016-01-05 ENCOUNTER — Telehealth (HOSPITAL_COMMUNITY): Payer: Self-pay | Admitting: *Deleted

## 2016-01-05 ENCOUNTER — Ambulatory Visit
Admission: RE | Admit: 2016-01-05 | Discharge: 2016-01-05 | Disposition: A | Discharge status: Home / Self Care | Payer: Medicaid Other | Source: Ambulatory Visit | Attending: Radiation Oncology | Admitting: Radiation Oncology

## 2016-01-05 ENCOUNTER — Other Ambulatory Visit: Payer: Self-pay | Admitting: Hematology and Oncology

## 2016-01-05 ENCOUNTER — Encounter: Payer: Self-pay | Admitting: *Deleted

## 2016-01-05 ENCOUNTER — Encounter: Payer: Self-pay | Admitting: Genetic Counselor

## 2016-01-05 ENCOUNTER — Ambulatory Visit: Payer: Medicaid Other | Attending: Surgery | Admitting: Physical Therapy

## 2016-01-05 ENCOUNTER — Encounter: Payer: Self-pay | Admitting: Physical Therapy

## 2016-01-05 DIAGNOSIS — C50412 Malignant neoplasm of upper-outer quadrant of left female breast: Secondary | ICD-10-CM | POA: Insufficient documentation

## 2016-01-05 DIAGNOSIS — R293 Abnormal posture: Secondary | ICD-10-CM | POA: Diagnosis present

## 2016-01-05 NOTE — Progress Notes (Signed)
Clinical Social Work [place] Psychosocial Distress Screening [place]  Patient completed distress screening protocol and scored a 4 on the Psychosocial Distress Thermometer which indicates mild distress. Clinical Social Worker met with patient in [facility] to assess for distress and other psychosocial needs. Patient stated she was feeling overwhelmed after meeting with the treatment team and getting more information on her treatment plan. CSW and patient discussed common feeling and emotions when being diagnosed with cancer, and the importance of support during treatment. CSW informed patient of the support team and support services at [facility]. CSW provided contact information and encouraged patient to call with any questions or concerns.  [facility] DISTRESS SCREENING 01/05/2016  Screening Type Initial Screening  Distress experienced in past week (1-10) 4  Practical problem type Insurance  Emotional problem type Depression;Isolation/feeling alone  Information Concerns Type Lack of info about diagnosis;Lack of info about treatment  Physical Problem type Pain;Sleep/insomnia;Constipation/diarrhea;Sexual problems  Physician notified of physical symptoms Yes  Referral to clinical social work Yes     Johnnye Lana, MSW, LCSW, OSW-C Clinical Social Worker [facility] [phone]

## 2016-01-05 NOTE — Progress Notes (Signed)
Radiation Oncology         [phone]) [phone] ________________________________  Initial outpatient Consultation  Name: [name] MRN: [id number]  Date: 01/05/2016  DOB: 04/04/1961  CC:No primary care provider on file.  [name], MD   REFERRING PHYSICIAN: [name], MD  DIAGNOSIS:    ICD-9-CM ICD-10-CM   1. 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 female who presented with left breast asymmetry on screening mammography. There was a possible suspicious intramammary node as well. The patient underwent ultrasound showing a 1.2 cm mass at 2 o'clock of the left breast. Biopsy showing pathology with characteristics as described above in the diagnosis. Left axillary lymph node was also biopsied but was benign. Biopsy of the intramammary node will be ordered.   She performs Art gallery manager work. She did not feel anything in her breast prior to screening mammogram. She has never been diagnosed with cancer before. She smokes cigarettes and has for quite a while. She is otherwise in her USOH. Reports loss of sleep and fatigue which affect her activities. Reports chronic pain in her leg and hip described as stabbing, throbbing, and muscle ache; associated with sciatica. She wears glasses. Reports ringing in ears, sinus problem, dental problems, SOB while walking up stairs, productive cough, urinary incontinence, lump in her breast, rash, bruises easily, back pain, arthritis, difficulties walking, headaches (history of migraines - hereditary), anxiety, depression, hot flashes, and immune disorder. Otherwise complete 15 point ROS is negative.  PREVIOUS RADIATION THERAPY: No  PAST MEDICAL HISTORY:  has a past medical history of Anxiety; Arthritis; Breast cancer of upper-outer quadrant of left female  breast ([place]) (01/03/2016); Chronic headaches; Complication of anesthesia; Cystocele; Depression; Eczema; H/O impetigo; High cholesterol; Hypertension; Internal hemorrhoids; Lymphocytic colitis; Menometrorrhagia; Migraines; PONV (postoperative nausea and vomiting); and TIA (transient ischemic attack).    PAST SURGICAL HISTORY: Past Surgical History:  Procedure Laterality Date  . BACK SURGERY    . [place]fr[place]  . COLONOSCOPY     Hx: of  . INCONTINENCE SURGERY    . TONSILLECTOMY    . TUBAL LIGATION      FAMILY HISTORY: family history includes Breast cancer in her paternal aunt; Colon cancer in her father; Hypertension in her father.  SOCIAL HISTORY:  reports that she has been smoking Cigarettes.  She has been smoking about 1.00 pack per day. She has never used smokeless tobacco. She reports that she does not drink alcohol or use drugs.  ALLERGIES: Adhesive [tape]; Augmentin [amoxicillin-pot clavulanate]; Latex; Nsaids; Oxycodone; and Mupirocin  MEDICATIONS:  Current Outpatient Prescriptions  Medication Sig Dispense Refill  . amitriptyline (ELAVIL) 25 MG tablet Take 25 mg by mouth at bedtime.    Marland Kitchen amLODipine (NORVASC) 5 MG tablet Take 5 mg by mouth daily.    Marland Kitchen aspirin-acetaminophen-caffeine (EXCEDRIN MIGRAINE) 250-250-65 MG per tablet Take 1 tablet by mouth every 6 (six) hours as needed for headache or migraine. For headache    . atorvastatin (LIPITOR) 20 MG tablet Take 20 mg by mouth daily.    . cholecalciferol (VITAMIN D) 1000 units tablet Take 1,000 Units by mouth daily.    . diphenhydrAMINE (SOMINEX) 25 MG tablet Take 25 mg by mouth daily as needed for allergies or sleep.    Marland Kitchen HYDROcodone-acetaminophen (NORCO/VICODIN) 5-325 MG tablet Take  1 tablet by mouth every 6 (six) hours as needed. (Patient not taking: Reported on 12/16/2015) 8 tablet 0  . ibuprofen (ADVIL,MOTRIN) 200 MG tablet Take 400 mg by mouth every 6 (six) hours as needed for moderate pain.    . Lactobacillus  (ACIDOPHILUS) 100 MG CAPS Take 100 mg by mouth 2 (two) times daily.    Marland Kitchen lisinopril-hydrochlorothiazide (PRINZIDE,ZESTORETIC) 20-12.5 MG tablet Take 1 tablet by mouth daily.    . meloxicam (MOBIC) 7.5 MG tablet Take 7.5 mg by mouth daily.    . methocarbamol (ROBAXIN) 500 MG tablet Take 1 tablet (500 mg total) by mouth 2 (two) times daily. (Patient not taking: Reported on 12/16/2015) 20 tablet 0  . Multiple Vitamins-Minerals (MULTIVITAMIN ADULT PO) Take by mouth.     No current facility-administered medications for this encounter.    Gynecologic History  Age at first menstrual period? 13  Are you still having periods? No Approximate date of last period? 2006  If you are still having periods: Are your periods regular? N/A  If you no longer have periods: Have you used hormone replacement? No  If YES, for how long? N/A When did you stop? N/A Obstetric History:  How many children have you carried to term? 2 Your age at first live birth? 25  Pregnant now or trying to get pregnant? No  Have you used birth control pills or hormone shots for contraception? Yes  If so, for how long (or approximate dates)? Birth control for 1 month - spikes BP  Would you be interested in learning more about the options to preserve fertility? N/A Health Maintenance:  Have you ever had a colonoscopy? Yes If yes, date? Not sure  Have you ever had a bone density? No If yes, date? N/A  Date of your last PAP smear? 1 month Date of your FIRST mammogram? 41  REVIEW OF SYSTEMS:  Notable for that above. 15 point review of systems was obtained.   PHYSICAL EXAM:  Vitals with BMI 01/05/2016  Height 5' 7.5"  Weight 207 lbs 2 oz  BMI 32  Systolic 062  Diastolic 80  Pulse 92  Respirations 18   General: Alert and oriented, in no acute distress HEENT: Head is normocephalic. Poor dentition. Neck: Neck is supple, no palpable cervical or supraclavicular lymphadenopathy. Heart: Regular in rate and rhythm with no murmurs, rubs,  or gallops. Chest: Clear to auscultation bilaterally, with no rhonchi, wheezes, or rales. Abdomen: Soft, nontender, nondistended, with no rigidity or guarding. Extremities: No cyanosis or edema. Lymphatics: see Neck Exam Skin: No concerning lesions. Musculoskeletal: symmetric strength and muscle tone throughout. Neurologic: Cranial nerves II through XII are grossly intact. No obvious focalities. Speech is fluent. Coordination is intact. Psychiatric: Judgment and insight are intact. Affect is appropriate. Breasts: In the UOQ of the left breast there is post biopsy bruising and a subtle area of thickening that is about 1 cm in dimension. The patient is tender in that area. No other palpable masses appreciated in the breasts or axillae.   ECOG = 1  0 - Asymptomatic (Fully active, able to carry on all predisease activities without restriction)  1 - Symptomatic but completely ambulatory (Restricted in physically strenuous activity but ambulatory and able to carry out work of a light or sedentary nature. For example, light housework, office work)  2 - Symptomatic, <50% in bed during the day (Ambulatory and capable of all self care but unable to carry out any work activities. Up and about more than 50%  of waking hours)  3 - Symptomatic, >50% in bed, but not bedbound (Capable of only limited self-care, confined to bed or chair 50% or more of waking hours)  4 - Bedbound (Completely disabled. Cannot carry on any self-care. Totally confined to bed or chair)  5 - Death   Eustace Pen MM, Creech RH, Tormey DC, et al. [phone]). "Toxicity and response criteria of the [facility]". [place] [facility]col. 5 (6): 649-55   LABORATORY DATA:  Lab Results  Component Value Date   WBC 17.0 (H) 04/29/2013   HGB 13.4 04/29/2013   HCT 42.0 04/29/2013   MCV 96.3 04/29/2013   PLT 225 04/29/2013   CMP     Component Value Date/Time   NA 135 04/30/2013 0502   K 3.5 04/30/2013 0502   CL 93 (L)  04/30/2013 0502   CO2 34 (H) 04/30/2013 0502   GLUCOSE 148 (H) 04/30/2013 0502   BUN 8 04/30/2013 0502   CREATININE 0.62 04/30/2013 0502   CALCIUM 8.4 04/30/2013 0502   PROT 7.4 04/18/2013 0954   ALBUMIN 4.1 04/18/2013 0954   AST 29 04/18/2013 0954   ALT 41 (H) 04/18/2013 0954   ALKPHOS 115 04/18/2013 0954   BILITOT 0.4 04/18/2013 0954   GFRNONAA >90 04/30/2013 0502   GFRAA >90 04/30/2013 0502         RADIOGRAPHY: Mm Digital Diagnostic Unilat L  Result Date: 12/29/2015 CLINICAL DATA:  Confirmation of clip placement after ultrasound-guided core needle biopsy of a suspicious mass in the upper outer quadrant of the left breast and biopsy of a left axillary lymph node with focal cortical thickening. EXAM: DIAGNOSTIC LEFT MAMMOGRAM POST ULTRASOUND BIOPSY COMPARISON:  Previous exam(s). FINDINGS: Mammographic images were obtained following ultrasound guided biopsy of a mass in the upper outer quadrant of the left breast and a left axillary lymph node. The coil shaped tissue marker clip is appropriately positioned at the site of the mass in the upper outer quadrant. An intramammary lymph node is approximately 2.5 cm posterolateral to the clip on the CC view. The [facility] spiral clip in the axillary lymph node is not visible. Expected post biopsy changes are present without evidence of hematoma. IMPRESSION: Appropriate positioning of the coil shaped tissue marker clip at the site of the mass in the upper outer quadrant of the left breast. Final Assessment: Post Procedure Mammograms for Marker Placement Electronically Signed   By: Evangeline Dakin M.D.   On: 12/29/2015 09:12   Ms Digital Screening Bilateral  Result Date: 12/17/2015 CLINICAL DATA:  Screening. EXAM: DIGITAL SCREENING BILATERAL MAMMOGRAM WITH CAD COMPARISON:  Previous exam(s). ACR Breast Density Category b: There are scattered areas of fibroglandular density. FINDINGS: In the left breast, a possible asymmetry with associated  microcalcifications warrants further evaluation. There is also a slightly increased in density intramammary lymph node in the left breast upper outer quadrant, slightly more posteriorly. In the right breast, no findings suspicious for malignancy. Images were processed with CAD. IMPRESSION: Further evaluation is suggested for possible asymmetry with associated microcalcifications, and increased in density intramammary lymph node in the upper-outer quadrant in the left breast. RECOMMENDATION: Diagnostic mammogram and possibly ultrasound of the left breast. (Code:[id number]) The patient will be contacted regarding the findings, and additional imaging will be scheduled. BI-RADS CATEGORY  0: Incomplete. Need additional imaging evaluation and/or prior mammograms for comparison. Electronically Signed   By: Fidela Salisbury M.D.   On: 12/17/2015 11:12   US Breast Ltd Uni Left Inc Axilla  Result Date: 12/23/2015 CLINICAL DATA:  Recall from screening mammography EXAM: 2D DIGITAL DIAGNOSTIC LEFT MAMMOGRAM WITH CAD AND ADJUNCT TOMO ULTRASOUND LEFT BREAST COMPARISON:  12/16/2015, 08/06/2007. ACR Breast Density Category b: There are scattered areas of fibroglandular density. FINDINGS: Additional views of the left breast demonstrate a partially obscured spiculated mass to be present within the upper-outer quadrant of the left breast with associated internal suspicious heterogeneous calcifications. The mass by mammography measures approximately 11 mm in size. In addition, there is an intramammary lymph node located within the upper-outer quadrant of the left breast which has increased in size when compared with the prior mammogram and it appears more dense. There is also an oval, circumscribed mass located more anteriorly within the left breast at the 1 o'clock position. Mammographic images were processed with CAD. On physical exam, there is a small, firm palpable mass located within the left breast at the 2 o'clock position  12 cm from the nipple. There is no palpable left axillary adenopathy. Targeted ultrasound is performed, showing an irregularly marginated, vertically oriented, hypoechoic mass with internal calcifications located within the left breast at the 2 o'clock position 12 cm from the nipple measuring 1.2 x 1.0 x 1.1 cm in size. This corresponds to the spiculated mass on mammography. This is suspicious for invasive mammary carcinoma. There is also an intramammary lymph node located within the left breast at 2 o'clock position 15 cm from the nipple measuring 5 x 5 x 3 mm in size. As this has increased in size and density on the mammogram, ultrasound-guided core biopsy of this is also recommended. In addition there is a simple cyst located within the left breast at the 1 o'clock position 7 cm from the nipple corresponding to the circumscribed lesion seen in this region more anteriorly located on the mammogram. Ultrasound of the left axilla demonstrates normal axillary contents and no evidence for adenopathy. IMPRESSION: 1. 1.2 cm spiculated mass located within the left breast at the 2 o'clock position 12 cm from the nipple worrisome for invasive mammary carcinoma. Tissue sampling via ultrasound-guided core biopsy is recommended. 2. 5 mm intramammary lymph node located within the left breast at the 2 o'clock position 15 cm from the nipple. This has increased in size on mammography and ultrasound-guided core biopsy of this is also recommended. 3. 6 mm simple cyst located within the left breast at the 1 o'clock position 7 cm from the nipple. RECOMMENDATION: Left breast ultrasound-guided core biopsies as discussed above. I have discussed the findings and recommendations with the patient. Results were also provided in writing at the conclusion of the visit. If applicable, a reminder letter will be sent to the patient regarding the next appointment. BI-RADS CATEGORY  5: Highly suggestive of malignancy. Electronically Signed   By:  Altamese Cabal M.D.   On: 12/23/2015 10:38   Mm Diag Breast Tomo Uni Left  Result Date: 12/23/2015 CLINICAL DATA:  Recall from screening mammography EXAM: 2D DIGITAL DIAGNOSTIC LEFT MAMMOGRAM WITH CAD AND ADJUNCT TOMO ULTRASOUND LEFT BREAST COMPARISON:  12/16/2015, 08/06/2007. ACR Breast Density Category b: There are scattered areas of fibroglandular density. FINDINGS: Additional views of the left breast demonstrate a partially obscured spiculated mass to be present within the upper-outer quadrant of the left breast with associated internal suspicious heterogeneous calcifications. The mass by mammography measures approximately 11 mm in size. In addition, there is an intramammary lymph node located within the upper-outer quadrant of the left breast which has increased in size when compared with the  prior mammogram and it appears more dense. There is also an oval, circumscribed mass located more anteriorly within the left breast at the 1 o'clock position. Mammographic images were processed with CAD. On physical exam, there is a small, firm palpable mass located within the left breast at the 2 o'clock position 12 cm from the nipple. There is no palpable left axillary adenopathy. Targeted ultrasound is performed, showing an irregularly marginated, vertically oriented, hypoechoic mass with internal calcifications located within the left breast at the 2 o'clock position 12 cm from the nipple measuring 1.2 x 1.0 x 1.1 cm in size. This corresponds to the spiculated mass on mammography. This is suspicious for invasive mammary carcinoma. There is also an intramammary lymph node located within the left breast at 2 o'clock position 15 cm from the nipple measuring 5 x 5 x 3 mm in size. As this has increased in size and density on the mammogram, ultrasound-guided core biopsy of this is also recommended. In addition there is a simple cyst located within the left breast at the 1 o'clock position 7 cm from the nipple  corresponding to the circumscribed lesion seen in this region more anteriorly located on the mammogram. Ultrasound of the left axilla demonstrates normal axillary contents and no evidence for adenopathy. IMPRESSION: 1. 1.2 cm spiculated mass located within the left breast at the 2 o'clock position 12 cm from the nipple worrisome for invasive mammary carcinoma. Tissue sampling via ultrasound-guided core biopsy is recommended. 2. 5 mm intramammary lymph node located within the left breast at the 2 o'clock position 15 cm from the nipple. This has increased in size on mammography and ultrasound-guided core biopsy of this is also recommended. 3. 6 mm simple cyst located within the left breast at the 1 o'clock position 7 cm from the nipple. RECOMMENDATION: Left breast ultrasound-guided core biopsies as discussed above. I have discussed the findings and recommendations with the patient. Results were also provided in writing at the conclusion of the visit. If applicable, a reminder letter will be sent to the patient regarding the next appointment. BI-RADS CATEGORY  5: Highly suggestive of malignancy. Electronically Signed   By: Altamese Cabal M.D.   On: 12/23/2015 10:38   [place] Lt Breast Bx W Loc Dev 1st Lesion Img Bx Spec US Guide  Addendum Date: 12/31/2015   ADDENDUM REPORT: 12/31/2015 08:07 ADDENDUM: Pathology revealed grade I invasive ductal carcinoma in the left breast and a benign left axillary lymph node. This was found to be concordant by Dr. Peggye Fothergill. Pathology results were discussed with the patient by telephone. The patient reported doing well after the biopsies with tenderness and bruising at the sites. Post biopsy instructions and care were reviewed and questions were answered. The patient was encouraged to call [facility] for any additional concerns. The patient was referred to the [facility] at the [facility] on January 05, 2016. Pathology results reported by Susa Raring RN, BSN on 12/31/2015. Electronically Signed   By: Evangeline Dakin M.D.   On: 12/31/2015 08:07   Result Date: 12/31/2015 CLINICAL DATA:  Screening detected 1.2 cm mass in the upper outer quadrant of the left breast at the 2 o'clock position approximately 10 cm from the nipple. Left axillary lymph node with focal cortical thickening on diagnostic workup. EXAM: ULTRASOUND GUIDED LEFT BREAST CORE NEEDLE BIOPSY COMPARISON:  Previous exam(s). FINDINGS: I met with the patient and we discussed the procedure of ultrasound-guided biopsy,  including benefits and alternatives. We discussed the high likelihood of a successful procedure. We discussed the risks of the procedure, including infection, bleeding, tissue injury, clip migration, and inadequate sampling. Informed written consent was given. The usual time-out protocol was performed immediately prior to the procedure. Using sterile technique with chlorhexidine as skin antisepsis, 1% Lidocaine as local anesthetic, under direct ultrasound visualization, a 12 gauge Bard Marquee core needle device was used to perform biopsy of the mass in the upper outer quadrant of the left breast using a lateral approach. At the conclusion of the procedure a coil shaped tissue marker clip was deployed into the biopsy cavity. Follow up 2 view mammogram was performed and dictated separately. IMPRESSION: Ultrasound guided biopsy of a suspicious 1.2 cm mass in the upper outer quadrant of the left breast. No apparent complications. Electronically Signed: By: Evangeline Dakin M.D. On: 12/29/2015 09:06   [place] Lt Breast Bx W Loc Dev Ea Add Lesion Img Bx Spec US Guide  Addendum Date: 12/31/2015   ADDENDUM REPORT: 12/31/2015 08:07 ADDENDUM: Pathology revealed grade I invasive ductal carcinoma in the left breast and a benign left axillary lymph node. This was found to be concordant by Dr. Peggye Fothergill. Pathology results were discussed with the  patient by telephone. The patient reported doing well after the biopsies with tenderness and bruising at the sites. Post biopsy instructions and care were reviewed and questions were answered. The patient was encouraged to call [facility] for any additional concerns. The patient was referred to the [facility] at the [facility] on January 05, 2016. Pathology results reported by Susa Raring RN, BSN on 12/31/2015. Electronically Signed   By: Evangeline Dakin M.D.   On: 12/31/2015 08:07   Result Date: 12/31/2015 CLINICAL DATA:  Screening detected 1.2 cm mass in the upper outer quadrant of the left breast at the 2 o'clock position approximately 10 cm from the nipple. Left axillary lymph node with focal cortical thickening on diagnostic workup. EXAM: ULTRASOUND GUIDED CORE NEEDLE BIOPSY OF A LEFT AXILLARY NODE COMPARISON:  Previous exam(s). FINDINGS: I met with the patient and we discussed the procedure of ultrasound-guided biopsy, including benefits and alternatives. We discussed the high likelihood of a successful procedure. We discussed the risks of the procedure, including infection, bleeding, tissue injury, clip migration, and inadequate sampling. Informed written consent was given. The usual time-out protocol was performed immediately prior to the procedure. Using sterile technique with chlorhexidine as skin antisepsis, 1% Lidocaine as local anesthetic, under direct ultrasound visualization, a 14 gauge Bard Marquee core needle device was used to perform biopsy of a level 1 left axillary lymph node with focal cortical thickening using an inferolateral approach. At the conclusion of the procedure a [employer] spiral shaped tissue marker clip was deployed into the biopsy cavity. Follow up 2 view mammogram was performed and dictated separately. IMPRESSION: Ultrasound guided biopsy of a level 1 left axillary lymph node with focal cortical  thickening. No apparent complications. Electronically Signed: By: Evangeline Dakin M.D. On: 12/29/2015 09:07      IMPRESSION/PLAN:   She has been discussed at our multidisciplinary tumor board.  The consensus is that she would be a good candidate for breast conservation. I talked to her about the option of a mastectomy and informed her that her expected overall survival would be equivalent between mastectomy and breast conservation, based upon randomized controlled data. She is enthusiastic about breast conservation.   The  team has discussed lumpectomy followed by oncotype testing and eventual adjuvant radiotherapy. An intramammary node biopsy will be ordered as well.  It was a pleasure meeting the patient today. We discussed the risks, benefits, and side effects of radiotherapy. I recommend radiotherapy to the left breast to reduce her risk of locoregional recurrence by 2/3.  We discussed that radiation would take approximately 6 weeks to complete and that I would give the patient a few weeks to heal following surgery before starting treatment planning. If chemotherapy were to be given, this would precede radiotherapy. We spoke about acute effects including skin irritation and fatigue as well as much less common late effects including internal organ injury or irritation. We spoke about the latest technology that is used to minimize the risk of late effects for patients undergoing radiotherapy to the breast or chest wall. No guarantees of treatment were given. The patient is enthusiastic about proceeding with treatment. I look forward to participating in the patient's care.  The patient continues to use tobacco. The patient was counseled to stop using tobacco and was offered pharmacotherapy and further counseling to help with this. The patient declined pharmacotherapy at this time. Advised to call 1800 Quit NOW for support.  __________________________________________   Eppie Gibson, MD    This  document serves as a record of services personally performed by Eppie Gibson, MD. It was created on her behalf by Arlyce Harman, a trained medical scribe. The creation of this record is based on the scribe's personal observations and the provider's statements to them. This document has been checked and approved by the attending provider.

## 2016-01-05 NOTE — Patient Instructions (Signed)

## 2016-01-05 NOTE — Progress Notes (Signed)
[place] NOTE  Patient Care Team: [name], MD as Consulting Physician (General Surgery) Nicholas Lose, MD as Consulting Physician (Hematology and Oncology) Eppie Gibson, MD as Attending Physician (Radiation Oncology)  CHIEF COMPLAINTS/PURPOSE OF CONSULTATION:  Newly diagnosed breast cancer  HISTORY OF PRESENTING ILLNESS:  [name] 55 y.o. female is here because of recent diagnosis of left breast cancer. Patient had a routine screening mammogram that detected an abnormality in the left breast upper-outer quadrant. She underwent ultrasound and biopsy. 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 She was presented at the multidisciplinary tumor board and she is here today to discuss a treatment plan.  I reviewed her records extensively and collaborated the history with the patient.  SUMMARY OF ONCOLOGIC HISTORY:   Breast cancer of upper-outer quadrant of left female breast ([place])   12/23/2015 Mammogram    Left breast 1.2 cm spiculated mass 2:00 position, 5 mm intramammary lymph node 2:00 position needs biopsy, 6 mm simple cyst at 1:00 position, T1c Nx stage IA      12/29/2015 Initial Diagnosis    Left breast biopsy UOQ 2:00 and centimeters from nipple: IDC, grade 1, ER 100%, PR 90%, Ki-67 10%, HER-2 negative ratio 1.16      MEDICAL HISTORY:  Past Medical History:  Diagnosis Date  . Anxiety   . Arthritis   . Breast cancer of upper-outer quadrant of left female breast ([place]) 01/03/2016  . Chronic headaches   . Complication of anesthesia    Hx: combativeness as child  . Cystocele   . Depression   . Eczema    Hx; of  . H/O impetigo    Hx: of  . High cholesterol   . Hypertension   . Internal hemorrhoids   . Lymphocytic colitis   . Menometrorrhagia   . Migraines   . PONV (postoperative nausea and vomiting)   . TIA (transient ischemic attack)     SURGICAL  HISTORY: Past Surgical History:  Procedure Laterality Date  . BACK SURGERY    . [place]  . COLONOSCOPY     Hx: of  . INCONTINENCE SURGERY    . TONSILLECTOMY    . TUBAL LIGATION      SOCIAL HISTORY: Social History   Social History  . Marital status: Divorced    Spouse name: N/A  . Number of children: 2  . Years of education: N/A   Occupational History  . [employer]ers [employer]   Social History Main Topics  . Smoking status: Current Every Day Smoker    Packs/day: 1.00    Types: Cigarettes  . Smokeless tobacco: Never Used  . Alcohol use No     Comment: minimal every 6 months  . Drug use: No  . Sexual activity: No   Other Topics Concern  . Not on file   Social History Narrative  . No narrative on file    FAMILY HISTORY: Family History  Problem Relation Age of Onset  . Colon cancer Father     75-65  . Hypertension Father   . Colon polyps    . Diabetes    . Breast cancer Paternal Aunt     ALLERGIES:  is allergic to adhesive [tape]; augmentin [amoxicillin-pot clavulanate]; latex; nsaids; oxycodone; and mupirocin.  MEDICATIONS:  Current Outpatient Prescriptions  Medication Sig Dispense Refill  . amitriptyline (ELAVIL) 25 MG tablet Take 25 mg by mouth  at bedtime.    Marland Kitchen amLODipine (NORVASC) 5 MG tablet Take 5 mg by mouth daily.    Marland Kitchen aspirin-acetaminophen-caffeine (EXCEDRIN MIGRAINE) 250-250-65 MG per tablet Take 1 tablet by mouth every 6 (six) hours as needed for headache or migraine. For headache    . atorvastatin (LIPITOR) 20 MG tablet Take 20 mg by mouth daily.    . cholecalciferol (VITAMIN D) 1000 units tablet Take 1,000 Units by mouth daily.    . diphenhydrAMINE (SOMINEX) 25 MG tablet Take 25 mg by mouth daily as needed for allergies or sleep.    Marland Kitchen HYDROcodone-acetaminophen (NORCO/VICODIN) 5-325 MG tablet Take 1 tablet by mouth every 6 (six) hours as needed. (Patient not taking: Reported on 12/16/2015) 8 tablet 0  . ibuprofen (ADVIL,MOTRIN) 200  MG tablet Take 400 mg by mouth every 6 (six) hours as needed for moderate pain.    . Lactobacillus (ACIDOPHILUS) 100 MG CAPS Take 100 mg by mouth 2 (two) times daily.    Marland Kitchen lisinopril-hydrochlorothiazide (PRINZIDE,ZESTORETIC) 20-12.5 MG tablet Take 1 tablet by mouth daily.    . meloxicam (MOBIC) 7.5 MG tablet Take 7.5 mg by mouth daily.    . methocarbamol (ROBAXIN) 500 MG tablet Take 1 tablet (500 mg total) by mouth 2 (two) times daily. (Patient not taking: Reported on 12/16/2015) 20 tablet 0  . Multiple Vitamins-Minerals (MULTIVITAMIN ADULT PO) Take by mouth.     No current facility-administered medications for this visit.     REVIEW OF SYSTEMS:   Constitutional: Denies fevers, chills or abnormal night sweats Eyes: Denies blurriness of vision, double vision or watery eyes Ears, nose, mouth, throat, and face: Denies mucositis or sore throat Respiratory: Denies cough, dyspnea or wheezes Cardiovascular: Denies palpitation, chest discomfort or lower extremity swelling Gastrointestinal:  Denies nausea, heartburn or change in bowel habits Skin: Denies abnormal skin rashes Lymphatics: Denies new lymphadenopathy or easy bruising Neurological:Denies numbness, tingling or new weaknesses Behavioral/Psych: Mood is stable, no new changes  Breast:  Denies any palpable lumps or discharge, Bruising around the biopsy site in the left breast All other systems were reviewed with the patient and are negative.  PHYSICAL EXAMINATION: ECOG PERFORMANCE STATUS: 1 - Symptomatic but completely ambulatory  Vitals:   01/05/16 0853  BP: 128/80  Pulse: 92  Resp: 18  Temp: 99 F (37.2 C)   Filed Weights   01/05/16 0853  Weight: 207 lb 1.6 oz (93.9 kg)    GENERAL:alert, no distress and comfortable SKIN: skin color, texture, turgor are normal, no rashes or significant lesions EYES: normal, conjunctiva are pink and non-injected, sclera clear OROPHARYNX:no exudate, no erythema and lips, buccal mucosa, and  tongue normal  NECK: supple, thyroid normal size, non-tender, without nodularity LYMPH:  no palpable lymphadenopathy in the cervical, axillary or inguinal LUNGS: clear to auscultation and percussion with normal breathing effort HEART: regular rate & rhythm and no murmurs and no lower extremity edema ABDOMEN:abdomen soft, non-tender and normal bowel sounds Musculoskeletal:no cyanosis of digits and no clubbing  PSYCH: alert & oriented x 3 with fluent speech NEURO: no focal motor/sensory deficits BREAST: No palpable nodules in breast. No palpable axillary or supraclavicular lymphadenopathy (exam performed in the presence of a chaperone)   LABORATORY DATA:  I have reviewed the data as listed Lab Results  Component Value Date   WBC 17.0 (H) 04/29/2013   HGB 13.4 04/29/2013   HCT 42.0 04/29/2013   MCV 96.3 04/29/2013   PLT 225 04/29/2013   Lab Results  Component Value Date  NA 135 04/30/2013   K 3.5 04/30/2013   CL 93 (L) 04/30/2013   CO2 34 (H) 04/30/2013    RADIOGRAPHIC STUDIES: I have personally reviewed the radiological reports and agreed with the findings in the report.  ASSESSMENT AND PLAN:  Breast cancer of upper-outer quadrant of left female breast ([place]) 12/23/2015: Left breast 1.2 cm spiculated mass 2:00 position, 5 mm intramammary lymph node 2:00 position needs biopsy, 6 mm simple cyst at 1:00 position, T1c Nx stage IA 12/29/2015: Left breast biopsy UOQ 2:00 and centimeters from nipple: IDC, grade 1, ER 100%, PR 90%, Ki-67 10%, HER-2 negative ratio 1.16  Pathology and radiology counseling:Discussed with the patient, the details of pathology including the type of breast cancer,the clinical staging, the significance of ER, PR and HER-2/neu receptors and the implications for treatment. After reviewing the pathology in detail, we proceeded to discuss the different treatment options between surgery, radiation, chemotherapy, antiestrogen therapies.  Recommendations: Biopsy of  the intramammary lymph node 1. Breast conserving surgery followed by 2. Oncotype DX testing to determine if chemotherapy would be of any benefit followed by 3. Adjuvant radiation therapy followed by 4. Adjuvant antiestrogen therapy  Oncotype counseling: I discussed Oncotype DX test. I explained to the patient that this is a 21 gene panel to evaluate patient tumors DNA to calculate recurrence score. This would help determine whether patient has high risk or intermediate risk or low risk breast cancer. She understands that if her tumor was found to be high risk, she would benefit from systemic chemotherapy. If low risk, no need of chemotherapy. If she was found to be intermediate risk, we would need to evaluate the score as well as other risk factors and determine if an abbreviated chemotherapy may be of benefit.  Return to clinic after surgery to discuss final pathology report and then determine if Oncotype DX testing will need to be sent.  All questions were answered. The patient knows to call the clinic with any problems, questions or concerns.    Rulon Eisenmenger, MD 01/05/16

## 2016-01-05 NOTE — Assessment & Plan Note (Signed)
12/23/2015: Left breast 1.2 cm spiculated mass 2:00 position, 5 mm intramammary lymph node 2:00 position needs biopsy, 6 mm simple cyst at 1:00 position, T1c Nx stage IA 12/29/2015: Left breast biopsy UOQ 2:00 and centimeters from nipple: IDC, grade 1, ER 100%, PR 90%, Ki-67 10%, HER-2 negative ratio 1.16  Pathology and radiology counseling:Discussed with the patient, the details of pathology including the type of breast cancer,the clinical staging, the significance of ER, PR and HER-2/neu receptors and the implications for treatment. After reviewing the pathology in detail, we proceeded to discuss the different treatment options between surgery, radiation, chemotherapy, antiestrogen therapies.  Recommendations: Biopsy of the intramammary lymph node 1. Breast conserving surgery followed by 2. Oncotype DX testing to determine if chemotherapy would be of any benefit followed by 3. Adjuvant radiation therapy followed by 4. Adjuvant antiestrogen therapy  Oncotype counseling: I discussed Oncotype DX test. I explained to the patient that this is a 21 gene panel to evaluate patient tumors DNA to calculate recurrence score. This would help determine whether patient has high risk or intermediate risk or low risk breast cancer. She understands that if her tumor was found to be high risk, she would benefit from systemic chemotherapy. If low risk, no need of chemotherapy. If she was found to be intermediate risk, we would need to evaluate the score as well as other risk factors and determine if an abbreviated chemotherapy may be of benefit.  Return to clinic after surgery to discuss final pathology report and then determine if Oncotype DX testing will need to be sent.

## 2016-01-05 NOTE — Therapy (Signed)
[place], [place], [address] Phone: [phone]   Fax:  [phone]  Physical Therapy Evaluation  Patient Details  Name: [name] MRN: [id number] Date of Birth: 1961-04-11 Referring Provider: Dr. [name]  Encounter Date: 01/05/2016      PT End of Session - 01/05/16 1433    Visit Number 1   Number of Visits 1   PT Start Time 3570   PT Stop Time 1779  Also saw pt from [phone] for a total of 23 minutes   PT Time Calculation (min) 10 min   Activity Tolerance Patient tolerated treatment well   Behavior During Therapy [facility] for tasks assessed/performed      Past Medical History:  Diagnosis Date  . Anxiety   . Arthritis   . Breast cancer of upper-outer quadrant of left female breast ([place]) 01/03/2016  . Chronic headaches   . Complication of anesthesia    Hx: combativeness as child  . Cystocele   . Depression   . Eczema    Hx; of  . H/O impetigo    Hx: of  . High cholesterol   . Hypertension   . Internal hemorrhoids   . Lymphocytic colitis   . Menometrorrhagia   . Migraines   . PONV (postoperative nausea and vomiting)   . TIA (transient ischemic attack)     Past Surgical History:  Procedure Laterality Date  . BACK SURGERY    . [place]  . COLONOSCOPY     Hx: of  . INCONTINENCE SURGERY    . TONSILLECTOMY    . TUBAL LIGATION      There were no vitals filed for this visit.       Subjective Assessment - 01/05/16 1433    Subjective Patient reports she is here today to be seen by her medical team for her newly diagnosed left breast cancer.   Pertinent History Patient was diagnosed on 12/16/15 with left grade 1 invasive ductal carcinoma breast cancer.  It is located in the upper outer quadrant and measures 1.2 cm.  It is ER/PR positive and HER2 negative with a Ki67 of 10%.     Patient Stated Goals Reduce lymphedema risk and learn post op shoulder ROM HEP             [facility] PT Assessment - 01/05/16 0001      Assessment   Medical Diagnosis Left breast cancer   Referring Provider Dr. [name]   Onset Date/Surgical Date 12/16/15   Hand Dominance Right   Prior Therapy none     Precautions   Precautions Other (comment)   Precaution Comments active breast cancer     Restrictions   Weight Bearing Restrictions No     Balance Screen   Has the patient fallen in the past 6 months No   Has the patient had a decrease in activity level because of a fear of falling?  No   Is the patient reluctant to leave their home because of a fear of falling?  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   Leisure She does not exercise and  reports she is unable to exercise due to her right leg weakness     Cognition   Overall Cognitive Status Within Functional Limits for tasks assessed     Posture/Postural Control   Posture/Postural Control Postural limitations   Postural Limitations Rounded Shoulders;Forward head;Increased thoracic kyphosis     ROM / Strength   AROM / PROM / Strength AROM;Strength     AROM   AROM Assessment Site Shoulder;Cervical   Right/Left Shoulder Right;Left   Right Shoulder Extension 45 Degrees   Right Shoulder Flexion 159 Degrees   Right Shoulder ABduction 175 Degrees   Right Shoulder Internal Rotation 55 Degrees   Right Shoulder External Rotation 80 Degrees   Left Shoulder Extension 51 Degrees   Left Shoulder Flexion 156 Degrees   Left Shoulder ABduction 155 Degrees   Left Shoulder Internal Rotation 60 Degrees   Left Shoulder External Rotation 87 Degrees   Cervical Flexion WNL   Cervical Extension WNL   Cervical -  Right Side Bend WNL   Cervical - Left Side Bend WNL   Cervical - Right Rotation WNL   Cervical - Left Rotation WNL     Strength   Overall Strength Within functional limits for tasks performed   Overall Strength Comments Legs not assessed but right leg is visibly atrophied from what she reports is from her back surgery           LYMPHEDEMA/ONCOLOGY QUESTIONNAIRE - 01/05/16 1431      Type   Cancer Type Left breast cancer     Lymphedema Assessments   Lymphedema Assessments Upper extremities     Right Upper Extremity Lymphedema   10 cm Proximal to Olecranon Process 30.1 cm   Olecranon Process 26.3 cm   10 cm Proximal to Ulnar Styloid Process 24 cm   Just Proximal to Ulnar Styloid Process 17.3 cm   Across Hand at [employer] 20.1 cm   At [place] of 2nd Digit 6.6 cm     Left Upper Extremity Lymphedema   10 cm Proximal to Olecranon Process 28.9 cm   Olecranon Process 25.2 cm   10 cm Proximal to Ulnar Styloid Process 22.2 cm   Just Proximal to Ulnar Styloid Process 16.7 cm   Across Hand at [employer] 20 cm   At [place] of 2nd Digit 6.4 cm       Patient was instructed today in a home exercise program today for post op shoulder range of motion. These included active assist shoulder flexion in sitting, scapular retraction, wall walking with shoulder abduction, and hands behind head external rotation.  She was encouraged to do these twice a day, holding 3 seconds and repeating 5 times when permitted by her physician.         PT Education - 01/05/16 1432    Education provided Yes   Education Details Lymphedema risk reduction and post op shoulder ROM HEP   Person(s) Educated Patient   Methods Explanation;Demonstration;Handout   Comprehension Returned demonstration;Verbalized understanding              Breast Clinic Goals - 01/05/16 1437      Patient will be able to verbalize understanding of pertinent lymphedema risk reduction practices relevant to her diagnosis  specifically related to skin care.   Baseline No knowledge   Time 1   Period Days   Status Achieved     Patient will be able to return demonstrate and/or verbalize understanding of the post-op home exercise program related to  regaining shoulder range of motion.   Baseline No knowledge   Time 1   Period Days   Status Achieved     Patient will be able to verbalize understanding of the importance of attending the postoperative After Breast Cancer Class for further lymphedema risk reduction education and therapeutic exercise.   Baseline No knowledge   Time 1   Period Days   Status Achieved              Plan - 01/05/16 1434    Clinical Impression Statement Patient was diagnosed on 12/16/15 with left grade 1 invasive ductal carcinoma breast cancer.  It is located in the upper outer quadrant and measures 1.2 cm.  It is ER/PR positive and HER2 negative with a Ki67 of 10%.  Her multidisciplinary medical team met prior to her assessment to determine a recommended treatment plan.  She is planning to have a left lumpectomy and sentinel node biopsy followed by Oncotype testing, radiation and antiestrogen therapy.  She may benefit from post op PT to regain shoulder ROM and reduce lymphedema risk.  Due to her only comorbidity being right leg pain, her eval is of low complexity.   Rehab Potential Excellent   Clinical Impairments Affecting Rehab Potential none   PT Frequency One time visit   PT Treatment/Interventions Patient/family education;Therapeutic exercise   PT Next Visit Plan Will f/u after surgery to determine PT needs   PT Home Exercise Plan Post op shoulder ROM HEP   Consulted and Agree with Plan of Care Patient      Patient will benefit from skilled therapeutic intervention in order to improve the following deficits and impairments:  Postural dysfunction, Decreased knowledge of precautions, Pain, Decreased range of motion, Impaired UE functional use  Visit Diagnosis: Carcinoma of  upper-outer quadrant of female breast, left (H[facility])  Abnormal posture   Patient will follow up at outpatient cancer rehab if needed following surgery.  If the patient requires physical therapy at that time, a specific plan will be dictated and sent to the referring physician for approval. The patient was educated today on appropriate basic range of motion exercises to begin post operatively and the importance of attending the After Breast Cancer class following surgery.  Patient was educated today on lymphedema risk reduction practices as it pertains to recommendations that will benefit the patient immediately following surgery.  She verbalized good understanding.  No additional physical therapy is indicated at this time.      Problem List Patient Active Problem List   Diagnosis Date Noted  . Breast cancer of upper-outer quadrant of left female breast ([place]) 01/03/2016  . 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 Spondylolisthesis of lumbar region 04/28/2013    Class: Chronic  . Dehydration 08/09/2011  . Hypertension 08/09/2011  . Hyperlipidemia LDL goal < 100 08/09/2011  . Colitis 08/09/2011  . TIA (transient ischemic attack) 08/09/2011  . ANXIETY 02/24/2010  . DEPRESSION 02/24/2010  . FLATULENCE-GAS-BLOATING 02/07/2008  . OTH&UNSPEC NONINFECTIOUS GASTROENTERITIS&COLITIS 12/12/2007  . DIARRHEA 10/10/2007  . ABDOMINAL PAIN, GENERALIZED 10/10/2007   Annia Friendly, PT 01/05/16 2:39 PM  [place], [place], [address] Phone: [phone]   Fax:  [phone]  Name: Hailee Hollick MRN: [id number] Date of Birth: 03-09-1961

## 2016-01-05 NOTE — Telephone Encounter (Signed)
Telephoned patient at home number and discussed with patient that she would be eligible for [facility] Medicaid. Patient stated was not able to come today due to visit being overwhelming today. Patient will come on Friday Sept 1 after biopsy appointment with [facility].

## 2016-01-07 ENCOUNTER — Ambulatory Visit
Admission: RE | Admit: 2016-01-07 | Discharge: 2016-01-07 | Disposition: A | Discharge status: Home / Self Care | Payer: No Typology Code available for payment source | Source: Ambulatory Visit | Attending: Surgery | Admitting: Surgery

## 2016-01-07 ENCOUNTER — Other Ambulatory Visit: Payer: Self-pay | Admitting: Hematology and Oncology

## 2016-01-07 DIAGNOSIS — C50412 Malignant neoplasm of upper-outer quadrant of left female breast: Secondary | ICD-10-CM

## 2016-01-10 ENCOUNTER — Encounter: Payer: Self-pay | Admitting: Surgery

## 2016-01-14 ENCOUNTER — Telehealth: Payer: Self-pay | Admitting: *Deleted

## 2016-01-14 NOTE — Telephone Encounter (Signed)
Denies questions or needs at this time. Encourage pt to call with concerns. Gave pt number to [place] for [facility] questions.

## 2016-01-18 ENCOUNTER — Other Ambulatory Visit: Payer: Self-pay | Admitting: Surgery

## 2016-01-18 DIAGNOSIS — C50912 Malignant neoplasm of unspecified site of left female breast: Secondary | ICD-10-CM

## 2016-01-24 ENCOUNTER — Other Ambulatory Visit: Payer: Self-pay | Admitting: Surgery

## 2016-01-24 DIAGNOSIS — C50912 Malignant neoplasm of unspecified site of left female breast: Secondary | ICD-10-CM

## 2016-01-28 ENCOUNTER — Encounter (HOSPITAL_BASED_OUTPATIENT_CLINIC_OR_DEPARTMENT_OTHER): Payer: Self-pay | Admitting: *Deleted

## 2016-01-28 DIAGNOSIS — J3489 Other specified disorders of nose and nasal sinuses: Secondary | ICD-10-CM

## 2016-01-28 DIAGNOSIS — T464X5A Adverse effect of angiotensin-converting-enzyme inhibitors, initial encounter: Secondary | ICD-10-CM

## 2016-01-28 HISTORY — DX: Other specified disorders of nose and nasal sinuses: J34.89

## 2016-01-28 HISTORY — DX: Adverse effect of angiotensin-converting-enzyme inhibitors, initial encounter: T46.4X5A

## 2016-01-28 NOTE — Pre-Procedure Instructions (Signed)
To come for BMET, EKG and to pick up 8 oz. Boost [employer]

## 2016-01-31 ENCOUNTER — Telehealth (HOSPITAL_COMMUNITY): Payer: Self-pay | Admitting: *Deleted

## 2016-01-31 NOTE — Telephone Encounter (Signed)
Patient called stating had questions about getting pain medications after surgery since [employer] Medicaid is not in effect. Advised patient to call nurse and see if any programs available to help with medications. Patient voiced understanding.

## 2016-02-01 ENCOUNTER — Other Ambulatory Visit: Payer: Self-pay

## 2016-02-01 ENCOUNTER — Encounter: Payer: Self-pay | Admitting: Surgery

## 2016-02-01 ENCOUNTER — Encounter (HOSPITAL_BASED_OUTPATIENT_CLINIC_OR_DEPARTMENT_OTHER): Payer: Self-pay | Admitting: *Deleted

## 2016-02-01 ENCOUNTER — Encounter (HOSPITAL_BASED_OUTPATIENT_CLINIC_OR_DEPARTMENT_OTHER)
Admission: RE | Admit: 2016-02-01 | Discharge: 2016-02-01 | Disposition: A | Discharge status: Home / Self Care | Payer: Medicaid Other | Source: Ambulatory Visit | Attending: Surgery | Admitting: Surgery

## 2016-02-01 DIAGNOSIS — Z01810 Encounter for preprocedural cardiovascular examination: Secondary | ICD-10-CM | POA: Insufficient documentation

## 2016-02-01 LAB — BASIC METABOLIC PANEL
Anion gap: 9 (ref 5–15)
BUN: 18 mg/dL (ref 6–20)
CO2: 24 mmol/L (ref 22–32)
Calcium: 9.5 mg/dL (ref 8.9–10.3)
Chloride: 104 mmol/L (ref 101–111)
Creatinine, Ser: 1.07 mg/dL — ABNORMAL HIGH (ref 0.44–1.00)
GFR calc non Af Amer: 58 mL/min — ABNORMAL LOW (ref >60)
GLUCOSE: 119 mg/dL — AB (ref 65–99)
Potassium: 3.9 mmol/L (ref 3.5–5.1)
SODIUM: 137 mmol/L (ref 135–145)

## 2016-02-01 NOTE — Progress Notes (Signed)
Pt given a carton of boost breeze 8 oz with verbal and written instructions to drink by 9 am morning of surgery, teach back ,  Pt voiced understanding,

## 2016-02-01 NOTE — Telephone Encounter (Signed)
This encounter was created in error - please disregard.

## 2016-02-01 NOTE — Progress Notes (Signed)
Abnormal labs reviewed with Dr.M. Jillyn Hidden no further treatment

## 2016-02-03 NOTE — H&P (Signed)
[name]  Location: [facility] Patient #: [id number] DOB: 1960/09/21 Undefined / Language: Cleophus Molt / Race: White Female  History of Present Illness   Anna patient is a 55 year old female who presents with a complaint of breast cancer.   Her PCP is Dr. Tillman Barnes. Though since she lost her insurance in Anna 2017, she goes to [name] and sees [name].  She is at [facility] clinic with Drs. [name].  She comes by herself.   Her last mammogram was at least 5 years ago. She lost her job in Anna 2017 and she lost her insurance. So she goes to [name] and sees [name] . [name] pushed her to get a mammogram. [name] underwent mammograms at [name] on 12/16/2015. She was found to have a 1.2 cm mass at Anna 2 o'clock of Anna left breast, a 5 mm intramammary lymph node in Anna left breast, and a 6 mm cyst at 1 o'clock in Anna left breast. Biopsy of Anna left breast at 2 o'clock on 12/29/2015 ([id number]) showed IDC, grade 1, ER - 100%, PR- 90%, Ki67 -10%, and Her2Neu - negative. Her father's sister had breast cancer. her last period was in 2006 - she had an endometrial ablation. She is on no hormones. She is a little combative. She also was mad at her interview at our office, but could not remember who interviewed her.  I discussed Anna options for breast cancer treatment with Anna patient. She is in [facility], which includes medical oncology and radiation oncology. I discussed Anna surgical options of lumpectomy vs. mastectomy. If mastectomy, there is Anna possibility of reconstruction. I discussed Anna options of lymph node biopsy. Anna treatment Barnes depends on Anna pathologic staging of Anna tumor and Anna patient's personal wishes. Anna risks of surgery include, but are not limited to, bleeding, infection, Anna need for further surgery, and nerve injury. Anna patient has  been given literature on Anna treatment of breast cancer.  Barnes: 1) left intramammary node biopsy, 2) left lumpectomy (seed loc) and left axillary SLNBx and see what biopsy is of intramammary node, 3) Oncotype  Past Medical History 1. HTN since age 69 2. Hypercholesterolemia 3. Migraine HA 4. Microscopic (lymphocytic) colitis She has extreme diarrhea twice daily She did see [name], but has no insurance. But according to Epic, she last saw him in 04/2013. 5. Anxiety and depression - goes to [facility] - did not have Anna name of one primary person she sees 6. Had low K+ and ataxia about 5 years ago. Was hospitalized at [facility]. She said that they put TIA in her records, though she has never had one. 7. L3-L4 fusion and screws - 04/28/2013 - [name] She has atrophy of her left leg - she ahs not followed up with [name] (she could not remember his name) She has chronic pain in her left leg 8. Bladder sling - [name] - 2006 She is no better 9. Smokes 1 ppd, her daugher, who wants to be a physcian, tells her to stop all Anna time.  Social History Divorced She works as an Teacher, early years/pre at [employer] - until she lost her job in Anna 2017.  2 children: [name], son in [place] and [name],daughter at [employer] in biology   Other Problems Anna Slipper, RN; 01/05/2016 8:12 AM) Anxiety Disorder Arthritis Atrial Fibrillation Back Pain Bladder Problems Depression General anesthesia -  complications Hemorrhoids High blood pressure Hypercholesterolemia Migraine Headache Ulcerative Colitis  Past Surgical History Anna Slipper, RN; 01/05/2016 8:12 AM) Breast Biopsy Left. Cesarean Section - Multiple Oral Surgery Tonsillectomy  Diagnostic Studies History Anna Slipper, RN; 01/05/2016 8:12 AM) Colonoscopy 5-10 years ago Mammogram within last year Pap Smear 1-5 years ago  Medication History Anna Slipper, RN;  01/05/2016 8:13 AM) Medications Reconciled  Social History Anna Slipper, RN; 01/05/2016 8:12 AM) Alcohol use Occasional alcohol use. Caffeine use Carbonated beverages, Coffee, Tea. No drug use Tobacco use Current every day smoker.  Family History Anna Slipper, RN; 01/05/2016 8:12 AM) Arthritis Mother. Breast Cancer Family Members In General. Cancer Family Members In General. Cerebrovascular Accident Family Members In General, Father. Colon Cancer Father. Colon Polyps Father. Diabetes Mellitus Family Members In General. Hypertension Family Members In General, Father. Migraine Headache Family Members In General, Father. Thyroid problems Mother.  Pregnancy / Birth History Anna Slipper, RN; 01/05/2016 8:12 AM) Age at menarche 66 years. Age of menopause <46 Contraceptive History Oral contraceptives. Gravida 3 Length (months) of breastfeeding 3-6 Maternal age 35-30 Para 2    Review of Systems Anna Slipper RN; 01/05/2016 8:12 AM) General Present- Fatigue. Not Present- Appetite Loss, Chills, Fever, Night Sweats, Weight Gain and Weight Loss. Skin Present- Dryness. Not Present- Change in Wart/Mole, Hives, Jaundice, New Lesions, Non-Healing Wounds, Rash and Ulcer. HEENT Present- Seasonal Allergies. Not Present- Earache, Hearing Loss, Hoarseness, Nose Bleed, Oral Ulcers, Ringing in Anna Ears, Sinus Pain, Sore Throat, Visual Disturbances, Wears glasses/contact lenses and Yellow Eyes. Respiratory Present- Chronic Cough and Snoring. Not Present- Bloody sputum, Difficulty Breathing and Wheezing. Breast Present- Breast Mass. Not Present- Breast Pain, Nipple Discharge and Skin Changes. Cardiovascular Present- Leg Cramps. Not Present- Chest Pain, Difficulty Breathing Lying Down, Palpitations, Rapid Heart Rate, Shortness of Breath and Swelling of Extremities. Gastrointestinal Present- Bloating, Chronic diarrhea and Hemorrhoids. Not Present- Abdominal Pain, Bloody Stool, Change in  Bowel Habits, Constipation, Difficulty Swallowing, Excessive gas, Gets full quickly at meals, Indigestion, Nausea, Rectal Pain and Vomiting. Female Genitourinary Present- Nocturia and Urgency. Not Present- Frequency, Painful Urination and Pelvic Pain. Musculoskeletal Present- Back Pain, Joint Pain, Joint Stiffness and Muscle Pain. Not Present- Muscle Weakness and Swelling of Extremities. Neurological Present- Headaches, Numbness and Tingling. Not Present- Decreased Memory, Fainting, Seizures, Tremor, Trouble walking and Weakness. Psychiatric Present- Depression. Not Present- Anxiety, Bipolar, Change in Sleep Pattern, Fearful and Frequent crying. Endocrine Present- Heat Intolerance. Not Present- Cold Intolerance, Excessive Hunger, Hair Changes, Hot flashes and New Diabetes. Hematology Present- Easy Bruising. Not Present- Blood Thinners, Excessive bleeding, Gland problems, HIV and Persistent Infections.   Physical Exam  General: Obese WFalert. She smells of smoke. Skin: Inspection and palpation of Anna skin unremarkable.  Eyes: Conjunctivae white, pupils equal. Face, ears, nose, mouth, and throat: Face - normal. Normal ears and nose. Lips and teeth normal.  Neck: Supple. No mass. Trachea midline. No thyroid mass.  Lymph Nodes: No supraclavicular, adenopathy, or axillary adenopathy.  Lungs: Normal respiratory effort. Clear to auscultation and symmetric breath sounds. Cardiovascular: Regular rate and rythm. Normal auscultation of Anna heart. No murmur or rub. Normal carotid pulse.  Breasts: Right - no mass Left - Bruise lateral at Anna 10 o'clock position. In a lumpectomy, there would be a chiance of a single incision.  Abdomen: Soft. No mass. Liver and spleen not palpable. No tenderness. No hernia. Obese. Small pannus.  Musculoskeletal/extremities: Weak, atrophic left leg.  Neurologic: Grossly intact to motor and sensory function. Left leg weaker.  Psychiatric: Has  normal mood and affect. Judgement and insight appear normal.   Assessment & Barnes  1.  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   [place] - Drs. [name]   Barnes:  1) left intramammary node biopsy (scheduled 01/07/2016)   Addendum Note(Forever Arechiga H. Rexford Prevo MD; 01/14/2016 1:40 PM)    Biopys of left intramammary lymph node on 9/1 -    Showed normal lymph node, but invasive ductal ca within soft tissue !!    I spoke to Dr. Lennie Muckle.    I discussed need for MRI - probably not    She recommended 2 wires to loc both lesions - they are about 2.5 cm apart.   2) left lumpectomy (seed loc) and left axillary SLNBx and see what biopsy is of intramammary node,  3) Oncotype  2.  LYMPHADENOPATHY (R59.1)  Impression: Abnormal left breast lymph node - will need biopsy 3. SMOKES CIGARETTES (F17.210) 4. LYMPHOCYTIC COLITIS (H06.237)  Impression: has seen [name] in Anna past (according to Epic, she last saw him in 04/2013)  She has extreme diarrhea twice daily  5. HTN since age 55 6. Hypercholesterolemia 7. Migraine HA  8. Anxiety and depression - goes to [facility] - did not have Anna name of one primary person she sees 9. Had low K+ and ataxia about 5 years ago. Was hospitalized at [facility]. She said that they put TIA in her records, though she has never had one. 10. L3-L4 fusion and screws - 04/28/2013 - [name] She has atrophy of her left leg - she ahs not followed up with [name] (she could not remember his name) She has chronic pain in her left leg 11. Bladder sling [name] - 2006 She is no better   [name], MD, [facility] Pager: [phone] Office phone:  [phone]

## 2016-02-04 ENCOUNTER — Ambulatory Visit
Admission: RE | Admit: 2016-02-04 | Discharge: 2016-02-04 | Disposition: A | Discharge status: Home / Self Care | Payer: No Typology Code available for payment source | Source: Ambulatory Visit | Attending: Surgery | Admitting: Surgery

## 2016-02-04 ENCOUNTER — Ambulatory Visit (HOSPITAL_BASED_OUTPATIENT_CLINIC_OR_DEPARTMENT_OTHER): Payer: Medicaid Other | Admitting: Certified Registered"

## 2016-02-04 ENCOUNTER — Encounter (HOSPITAL_BASED_OUTPATIENT_CLINIC_OR_DEPARTMENT_OTHER)
Admission: RE | Disposition: A | Discharge status: Home / Self Care | Payer: Self-pay | Source: Ambulatory Visit | Attending: Surgery

## 2016-02-04 ENCOUNTER — Ambulatory Visit (HOSPITAL_COMMUNITY)
Admission: RE | Admit: 2016-02-04 | Discharge: 2016-02-04 | Disposition: A | Discharge status: Home / Self Care | Payer: Medicaid Other | Source: Ambulatory Visit | Attending: Surgery | Admitting: Surgery

## 2016-02-04 ENCOUNTER — Ambulatory Visit (HOSPITAL_BASED_OUTPATIENT_CLINIC_OR_DEPARTMENT_OTHER)
Admission: RE | Admit: 2016-02-04 | Discharge: 2016-02-04 | Disposition: A | Discharge status: Home / Self Care | Payer: Medicaid Other | Source: Ambulatory Visit | Attending: Surgery | Admitting: Surgery

## 2016-02-04 ENCOUNTER — Encounter (HOSPITAL_BASED_OUTPATIENT_CLINIC_OR_DEPARTMENT_OTHER): Payer: Self-pay | Admitting: *Deleted

## 2016-02-04 DIAGNOSIS — E7800 Pure hypercholesterolemia, unspecified: Secondary | ICD-10-CM | POA: Diagnosis not present

## 2016-02-04 DIAGNOSIS — Z170 Estrogen receptor positive status [ER+]: Secondary | ICD-10-CM | POA: Insufficient documentation

## 2016-02-04 DIAGNOSIS — C50912 Malignant neoplasm of unspecified site of left female breast: Secondary | ICD-10-CM

## 2016-02-04 DIAGNOSIS — Z791 Long term (current) use of non-steroidal anti-inflammatories (NSAID): Secondary | ICD-10-CM | POA: Diagnosis not present

## 2016-02-04 DIAGNOSIS — I10 Essential (primary) hypertension: Secondary | ICD-10-CM | POA: Insufficient documentation

## 2016-02-04 DIAGNOSIS — Z803 Family history of malignant neoplasm of breast: Secondary | ICD-10-CM | POA: Diagnosis not present

## 2016-02-04 DIAGNOSIS — F329 Major depressive disorder, single episode, unspecified: Secondary | ICD-10-CM | POA: Insufficient documentation

## 2016-02-04 DIAGNOSIS — F17210 Nicotine dependence, cigarettes, uncomplicated: Secondary | ICD-10-CM | POA: Insufficient documentation

## 2016-02-04 DIAGNOSIS — Z79899 Other long term (current) drug therapy: Secondary | ICD-10-CM | POA: Insufficient documentation

## 2016-02-04 DIAGNOSIS — C50412 Malignant neoplasm of upper-outer quadrant of left female breast: Secondary | ICD-10-CM | POA: Insufficient documentation

## 2016-02-04 HISTORY — DX: Adverse effect of angiotensin-converting-enzyme inhibitors, initial encounter: T46.4X5A

## 2016-02-04 HISTORY — DX: Cough: R05

## 2016-02-04 HISTORY — DX: Other specified disorders of nose and nasal sinuses: J34.89

## 2016-02-04 HISTORY — DX: Sciatica, unspecified side: M54.30

## 2016-02-04 HISTORY — DX: Urgency of urination: R39.15

## 2016-02-04 HISTORY — PX: BREAST LUMPECTOMY WITH NEEDLE LOCALIZATION AND AXILLARY SENTINEL LYMPH NODE BX: SHX5760

## 2016-02-04 SURGERY — BREAST LUMPECTOMY WITH NEEDLE LOCALIZATION AND AXILLARY SENTINEL LYMPH NODE BX
Anesthesia: Regional | Site: Breast | Laterality: Left

## 2016-02-04 MED ORDER — HYDROCODONE-ACETAMINOPHEN 5-325 MG PO TABS
1.0000 | ORAL_TABLET | Freq: Once | ORAL | Status: AC
  Administered 2016-02-04: 1 via ORAL

## 2016-02-04 MED ORDER — HYDROCODONE-ACETAMINOPHEN 5-325 MG PO TABS
1.0000 | ORAL_TABLET | Freq: Four times a day (QID) | ORAL | 0 refills | Status: DC | PRN
Start: 2016-02-04 — End: 2016-02-25

## 2016-02-04 MED ORDER — ACETAMINOPHEN 500 MG PO TABS
1000.0000 mg | ORAL_TABLET | ORAL | Status: AC
  Administered 2016-02-04: 1000 mg via ORAL

## 2016-02-04 MED ORDER — MIDAZOLAM HCL 2 MG/2ML IJ SOLN
1.0000 mg | INTRAMUSCULAR | Status: DC | PRN
  Administered 2016-02-04: 1 mg via INTRAVENOUS
  Administered 2016-02-04: 2 mg via INTRAVENOUS

## 2016-02-04 MED ORDER — BUPIVACAINE-EPINEPHRINE (PF) 0.25% -1:200000 IJ SOLN
INTRAMUSCULAR | Status: DC | PRN
  Administered 2016-02-04: 15 mL via PERINEURAL

## 2016-02-04 MED ORDER — HYDROMORPHONE HCL 1 MG/ML IJ SOLN
0.2500 mg | INTRAMUSCULAR | Status: DC | PRN
Start: 2016-02-04 — End: 2016-02-04
  Administered 2016-02-04: 0.25 mg via INTRAVENOUS
  Administered 2016-02-04: 0.5 mg via INTRAVENOUS
  Administered 2016-02-04: 0.25 mg via INTRAVENOUS

## 2016-02-04 MED ORDER — SODIUM CHLORIDE 0.9 % IJ SOLN
INTRAVENOUS | Status: DC | PRN
  Administered 2016-02-04: 5 mL

## 2016-02-04 MED ORDER — HYDROCODONE-ACETAMINOPHEN 5-325 MG PO TABS
ORAL_TABLET | ORAL | Status: AC
  Filled 2016-02-04: qty 1

## 2016-02-04 MED ORDER — ACETAMINOPHEN 500 MG PO TABS
ORAL_TABLET | ORAL | Status: AC
  Filled 2016-02-04: qty 2

## 2016-02-04 MED ORDER — GLYCOPYRROLATE 0.2 MG/ML IJ SOLN: 0.2000 mg | Freq: Once | INTRAMUSCULAR | Status: DC | PRN

## 2016-02-04 MED ORDER — DEXAMETHASONE SODIUM PHOSPHATE 10 MG/ML IJ SOLN
INTRAMUSCULAR | Status: AC
  Filled 2016-02-04: qty 1

## 2016-02-04 MED ORDER — EPHEDRINE SULFATE 50 MG/ML IJ SOLN
INTRAMUSCULAR | Status: DC | PRN
  Administered 2016-02-04: 10 mg via INTRAVENOUS
  Administered 2016-02-04: 20 mg via INTRAVENOUS
  Administered 2016-02-04: 10 mg via INTRAVENOUS

## 2016-02-04 MED ORDER — CHLORHEXIDINE GLUCONATE CLOTH 2 % EX PADS: 6.0000 | MEDICATED_PAD | Freq: Once | CUTANEOUS | Status: DC

## 2016-02-04 MED ORDER — BUPIVACAINE-EPINEPHRINE (PF) 0.5% -1:200000 IJ SOLN
INTRAMUSCULAR | Status: DC | PRN
  Administered 2016-02-04: 30 mL

## 2016-02-04 MED ORDER — FENTANYL CITRATE (PF) 100 MCG/2ML IJ SOLN
INTRAMUSCULAR | Status: AC
  Filled 2016-02-04: qty 2

## 2016-02-04 MED ORDER — LACTATED RINGERS IV SOLN
INTRAVENOUS | Status: DC
  Administered 2016-02-04 (×2): via INTRAVENOUS

## 2016-02-04 MED ORDER — MIDAZOLAM HCL 2 MG/2ML IJ SOLN
INTRAMUSCULAR | Status: AC
  Filled 2016-02-04: qty 2

## 2016-02-04 MED ORDER — PHENYLEPHRINE HCL 10 MG/ML IJ SOLN
INTRAMUSCULAR | Status: AC
  Filled 2016-02-04: qty 1

## 2016-02-04 MED ORDER — GABAPENTIN 300 MG PO CAPS
300.0000 mg | ORAL_CAPSULE | ORAL | Status: AC
  Administered 2016-02-04: 300 mg via ORAL

## 2016-02-04 MED ORDER — EPHEDRINE 5 MG/ML INJ
INTRAVENOUS | Status: AC
  Filled 2016-02-04: qty 10

## 2016-02-04 MED ORDER — TECHNETIUM TC 99M SULFUR COLLOID FILTERED
1.0000 | Freq: Once | INTRAVENOUS | Status: AC | PRN
  Administered 2016-02-04: 1 via INTRADERMAL

## 2016-02-04 MED ORDER — PROPOFOL 10 MG/ML IV BOLUS
INTRAVENOUS | Status: DC | PRN
  Administered 2016-02-04: 120 mg via INTRAVENOUS

## 2016-02-04 MED ORDER — CEFAZOLIN SODIUM-DEXTROSE 2-4 GM/100ML-% IV SOLN
2.0000 g | INTRAVENOUS | Status: AC
  Administered 2016-02-04: 2 g via INTRAVENOUS

## 2016-02-04 MED ORDER — HYDROMORPHONE HCL 1 MG/ML IJ SOLN
INTRAMUSCULAR | Status: AC
  Filled 2016-02-04: qty 1

## 2016-02-04 MED ORDER — GABAPENTIN 300 MG PO CAPS
ORAL_CAPSULE | ORAL | Status: AC
  Filled 2016-02-04: qty 1

## 2016-02-04 MED ORDER — DEXAMETHASONE SODIUM PHOSPHATE 4 MG/ML IJ SOLN
INTRAMUSCULAR | Status: DC | PRN
  Administered 2016-02-04: 10 mg via INTRAVENOUS

## 2016-02-04 MED ORDER — ONDANSETRON HCL 4 MG/2ML IJ SOLN
INTRAMUSCULAR | Status: AC
  Filled 2016-02-04: qty 2

## 2016-02-04 MED ORDER — PROPOFOL 10 MG/ML IV BOLUS
INTRAVENOUS | Status: AC
  Filled 2016-02-04: qty 20

## 2016-02-04 MED ORDER — CEFAZOLIN SODIUM-DEXTROSE 2-4 GM/100ML-% IV SOLN
INTRAVENOUS | Status: AC
  Filled 2016-02-04: qty 100

## 2016-02-04 MED ORDER — PHENYLEPHRINE HCL 10 MG/ML IJ SOLN
INTRAVENOUS | Status: DC | PRN
  Administered 2016-02-04: 50 ug/min via INTRAVENOUS

## 2016-02-04 MED ORDER — LIDOCAINE 2% (20 MG/ML) 5 ML SYRINGE
INTRAMUSCULAR | Status: AC
Start: 2016-02-04 — End: 2016-02-04
  Filled 2016-02-04: qty 5

## 2016-02-04 MED ORDER — SCOPOLAMINE 1 MG/3DAYS TD PT72
1.0000 | MEDICATED_PATCH | Freq: Once | TRANSDERMAL | Status: DC | PRN
  Administered 2016-02-04: 1.5 mg via TRANSDERMAL

## 2016-02-04 MED ORDER — PHENYLEPHRINE HCL 10 MG/ML IJ SOLN
INTRAMUSCULAR | Status: DC | PRN
Start: 2016-02-04 — End: 2016-02-04
  Administered 2016-02-04 (×2): 40 ug via INTRAVENOUS

## 2016-02-04 MED ORDER — LIDOCAINE HCL (CARDIAC) 20 MG/ML IV SOLN
INTRAVENOUS | Status: DC | PRN
  Administered 2016-02-04: 30 mg via INTRAVENOUS

## 2016-02-04 MED ORDER — FENTANYL CITRATE (PF) 100 MCG/2ML IJ SOLN
50.0000 ug | INTRAMUSCULAR | Status: DC | PRN
  Administered 2016-02-04: 100 ug via INTRAVENOUS
  Administered 2016-02-04: 50 ug via INTRAVENOUS

## 2016-02-04 SURGICAL SUPPLY — 53 items
APPLIER CLIP 11 MED OPEN (CLIP)
BINDER BREAST LRG (GAUZE/BANDAGES/DRESSINGS) IMPLANT
BINDER BREAST MEDIUM (GAUZE/BANDAGES/DRESSINGS) IMPLANT
BINDER BREAST XLRG (GAUZE/BANDAGES/DRESSINGS) IMPLANT
BINDER BREAST XXLRG (GAUZE/BANDAGES/DRESSINGS) IMPLANT
BLADE SURG 10 STRL SS (BLADE) ×3 IMPLANT
BLADE SURG 15 STRL LF DISP TIS (BLADE) ×1 IMPLANT
BLADE SURG 15 STRL SS (BLADE) ×2
CANISTER SUCT 1200ML W/VALVE (MISCELLANEOUS) ×3 IMPLANT
CHLORAPREP W/TINT 26ML (MISCELLANEOUS) ×3 IMPLANT
CLIP APPLIE 11 MED OPEN (CLIP) IMPLANT
CLIP TI WIDE RED SMALL 6 (CLIP) ×3 IMPLANT
COVER BACK TABLE 60X90IN (DRAPES) ×3 IMPLANT
COVER MAYO STAND STRL (DRAPES) ×3 IMPLANT
COVER PROBE W GEL 5X96 (DRAPES) ×3 IMPLANT
DECANTER SPIKE VIAL GLASS SM (MISCELLANEOUS) IMPLANT
DERMABOND ADVANCED (GAUZE/BANDAGES/DRESSINGS) ×2
DERMABOND ADVANCED .7 DNX12 (GAUZE/BANDAGES/DRESSINGS) ×1 IMPLANT
DEVICE DUBIN W/COMP PLATE 8390 (MISCELLANEOUS) ×3 IMPLANT
DRAPE LAPAROSCOPIC ABDOMINAL (DRAPES) ×3 IMPLANT
DRAPE UTILITY XL STRL (DRAPES) ×3 IMPLANT
ELECT COATED BLADE 2.86 ST (ELECTRODE) ×3 IMPLANT
ELECT REM PT RETURN 9FT ADLT (ELECTROSURGICAL) ×3
ELECTRODE REM PT RTRN 9FT ADLT (ELECTROSURGICAL) ×1 IMPLANT
GAUZE SPONGE 4X4 12PLY STRL (GAUZE/BANDAGES/DRESSINGS) IMPLANT
GLOVE SURG SIGNA 7.5 PF LTX (GLOVE) ×3 IMPLANT
GOWN STRL REUS W/ TWL LRG LVL3 (GOWN DISPOSABLE) ×1 IMPLANT
GOWN STRL REUS W/ TWL XL LVL3 (GOWN DISPOSABLE) ×1 IMPLANT
GOWN STRL REUS W/TWL LRG LVL3 (GOWN DISPOSABLE) ×2
GOWN STRL REUS W/TWL XL LVL3 (GOWN DISPOSABLE) ×2
KIT MARKER MARGIN INK (KITS) IMPLANT
NDL SAFETY ECLIPSE 18X1.5 (NEEDLE) IMPLANT
NEEDLE HYPO 18GX1.5 SHARP (NEEDLE)
NEEDLE HYPO 25X1 1.5 SAFETY (NEEDLE) ×3 IMPLANT
NS IRRIG 1000ML POUR BTL (IV SOLUTION) ×3 IMPLANT
PACK BASIN DAY SURGERY FS (CUSTOM PROCEDURE TRAY) ×3 IMPLANT
PAD ALCOHOL SWAB (MISCELLANEOUS) IMPLANT
PENCIL BUTTON HOLSTER BLD 10FT (ELECTRODE) ×3 IMPLANT
SHEET MEDIUM DRAPE 40X70 STRL (DRAPES) ×3 IMPLANT
SLEEVE SCD COMPRESS KNEE MED (MISCELLANEOUS) ×3 IMPLANT
SPONGE GAUZE 4X4 12PLY STER LF (GAUZE/BANDAGES/DRESSINGS) IMPLANT
SPONGE LAP 18X18 X RAY DECT (DISPOSABLE) ×3 IMPLANT
SUT MNCRL AB 4-0 PS2 18 (SUTURE) ×3 IMPLANT
SUT MON AB 5-0 PS2 18 (SUTURE) IMPLANT
SUT SILK 3 0 TIES 17X18 (SUTURE)
SUT SILK 3-0 18XBRD TIE BLK (SUTURE) IMPLANT
SUT VICRYL 3-0 CR8 SH (SUTURE) ×3 IMPLANT
SYR CONTROL 10ML LL (SYRINGE) ×6 IMPLANT
TOWEL OR 17X24 6PK STRL BLUE (TOWEL DISPOSABLE) ×3 IMPLANT
TOWEL OR NON WOVEN STRL DISP B (DISPOSABLE) ×3 IMPLANT
TUBE CONNECTING 20'X1/4 (TUBING) ×1
TUBE CONNECTING 20X1/4 (TUBING) ×2 IMPLANT
YANKAUER SUCT BULB TIP NO VENT (SUCTIONS) ×3 IMPLANT

## 2016-02-04 NOTE — Progress Notes (Signed)
Emotional support during breast injections °

## 2016-02-04 NOTE — Anesthesia Procedure Notes (Addendum)
Anesthesia Regional Block:  Pectoralis block  Pre-Anesthetic Checklist: ,, timeout performed, Correct Patient, Correct Site, Correct Laterality, Correct Procedure, Correct Position, site marked, Risks and benefits discussed, pre-op evaluation,  At surgeon's request and post-op pain management  Laterality: Left  Prep: Maximum Sterile Barrier Precautions used, chloraprep       Needles:  Injection technique: Single-shot  Needle Type: Echogenic Stimulator Needle     Needle Length: 9cm 9 cm Needle Gauge: 21 and 21 G    Additional Needles:  Procedures: ultrasound guided (picture in chart) Pectoralis block Narrative:  Start time: 02/04/2016 12:45 PM End time: 02/04/2016 12:55 PM Injection made incrementally with aspirations every 5 mL. Anesthesiologist: Roderic Palau  Additional Notes: 2% Lidocaine skin wheel.

## 2016-02-04 NOTE — Progress Notes (Signed)
Assisted Dr. Oren Bracket with left, ultrasound guided, pectoralis block. Side rails up, monitors on throughout procedure. See vital signs in flow sheet. Tolerated Procedure well.

## 2016-02-04 NOTE — Discharge Instructions (Signed)
°  Post Anesthesia Home Care Instructions  Activity: Get plenty of rest for the remainder of the day. A responsible adult should stay with you for 24 hours following the procedure.  For the next 24 hours, DO NOT: -Drive a car -Paediatric nurse -Drink alcoholic beverages -Take any medication unless instructed by your physician -Make any legal decisions or sign important papers.  Meals: Start with liquid foods such as gelatin or soup. Progress to regular foods as tolerated. Avoid greasy, spicy, heavy foods. If nausea and/or vomiting occur, drink only clear liquids until the nausea and/or vomiting subsides. Call your physician if vomiting continues.  Special Instructions/Symptoms: Your throat may feel dry or sore from the anesthesia or the breathing tube placed in your throat during surgery. If this causes discomfort, gargle with warm salt water. The discomfort should disappear within 24 hours.  If you had a scopolamine patch placed behind your ear for the management of post- operative nausea and/or vomiting:  1. The medication in the patch is effective for 72 hours, after which it should be removed.  Wrap patch in a tissue and discard in the trash. Wash hands thoroughly with soap and water. 2. You may remove the patch earlier than 72 hours if you experience unpleasant side effects which may include dry mouth, dizziness or visual disturbances. 3. Avoid touching the patch. Wash your hands with soap and water after contact with the patch.   [facility] - DISCHARGE INSTRUCTIONS TO PATIENT  Activity:  Driving - May drive in 2 or 3 days, if doing well   Lifting - No lifting more than 15 pounds for 7 days, then no limit  Wound Care:   Leave incision dry for 2 days, the may removed bandage and shower  Diet:  As tolerated  Follow up appointment:  Call Dr. Pollie Friar office [facility]) at [phone] for an appointment in 2 weeks  Medications and dosages:  Resume your  home medications.  You have a prescription for:  Vicodin  Call Dr. Lucia Gaskins or his office  [phone]) if you have:  Temperature greater than 100.4,  Persistent nausea and vomiting,  Severe uncontrolled pain,  Redness, tenderness, or signs of infection (pain, swelling, redness, odor or green/yellow discharge around the site),  Difficulty breathing, headache or visual disturbances,  Any other questions or concerns you may have after discharge.  In an emergency, call 911 or go to an Emergency Department at a nearby hospital.

## 2016-02-04 NOTE — Op Note (Signed)
02/04/2016  3:11 PM  PATIENT:  [name] DOB: [birth year] MRN: [id number]  PREOP DIAGNOSIS:  LEFT BREAST CANCER, 2 AREAS  POSTOP DIAGNOSIS:   LEFT BREAST CANCER, 2 AREAS, 2 o'clock position (T1, N0)  PROCEDURE:   Procedure(s): LEFT BREAST LUMPECTOMY WITH 2 NEEDLE LOCALIZATIONS AND LEFT AXILLARY SENTINEL LYMPH NODE BIOPSY, Injection of peri areolar area of breast with methylene blue (1.4 cc), single incision for both wires and left axillary node biopsy  SURGEON:   [name], M.D.  ANESTHESIA:   general  Anesthesiologist: Roderic Palau, MD CRNA: Lyndee Leo, CRNA; Ernesta Amble Blocker, CRNA  General  EBL:  minimal  ml  DRAINS: none   LOCAL MEDICATIONS USED:   15 cc 1/4% marcaine with epi, left pectoral block by anesthesia  SPECIMEN:   Left breast lumpectomy (6 color paint), superior margin (2 color paint, suture anterior), left axillary sentinel lymph node (counts 250) and 400, background 5, both blue  COUNTS CORRECT:  YES  INDICATIONS FOR PROCEDURE:  [name] is a 55 y.o. (DOB: 1960-08-23) white female whose primary care physician is Elbert Ewings, FNP and comes for left breast lumpectomy x 2 and left axillary sentinel lymph node biopsy.   She was seen at the breast [facility] on 01/05/2016 for a left breast cancer with Drs. Philis Nettle.     Options for breast cancer treatment were discussed with the patient. She elected to proceed with lumpectomy and axillary sentinel lymph node.  She had a biopsy of a left breast intramammary node on 01/07/2016 in which the lymph node was okay, but there was breast cancer in the soft tissue.  So she has 2 foci of left breast cancer, but these are close enough to remove together.   I thought the best chance of getting the two cancers was with two localizations using wires.    The indications and potential complications of surgery were explained to the patient. Potential complications include, but are not limited to, bleeding,  infection, the need for further surgery, and nerve injury.     The patient had two needle loc wires placed at the laterally in the 2 o'clock position of the left breast at [facility] by Dr. Lennie Muckle.  In the holding area, her left areola was injected with 1 millicurie of Technitium Sulfur Colloid.  OPERATIVE NOTE:   The patient was taken to room # 8 at [facility] where she underwent a general anesthesia  supervised by Anesthesiologist: Roderic Palau, MD CRNA: Lyndee Leo, CRNA; Ernesta Amble Blocker, CRNA. Her left breast and axilla were prepped with  ChloraPrep and sterilely draped.    A time-out and the surgical check list was reviewed.    I injected about 1.5 mL of 40% methylene blue around her left areola.   I first excised the left breast cancers which was about at the 2 o'clock position of the left breast lateral almost to the axilla. I cut down around the 2 wires and tried to take an ellipse of breast tissue around the tumor by at least 1 cm.   I excised this block of breast tissue approximately 5 cm by 5 cm  in diameter. I did take the dissection down to the pectoralis major. I painted the lumpectomy specimen with the 6 color paint kit and did a specimen mammogram which confirmed the mass, clip and the wire were all in the right position.    The clips were neat the cranial margin of the  specimen, so I took an additional cranial margin and painted it with two colors.   I started with the left axillary sentinel lymph node biopsy. The lumpectomy incision was close enough to the axilla that I could use it for the left axillary sentinel lymph node.  I found a hot area at the junction of the breast and the pectoralis major muscle. I cut down and identified two hot nodes that had counts of 400 and 250 and the background has 5 counts. The lymph nodes were blue. I checked her internal mammary nodes and supraclavicular nodes with the neoprobe and found no other hot area. The  axillary node was then sent to pathology.    I then irrigated the wound with saline. I infiltrated approximately 15 mL of 1/4% Marcain in the  Incision.  She had a pectoral block pre op.   I placed 4 clips to mark biopsy cavity, at 12, 3, 6, and 9 o'clock.   I then closed all the wounds in layers using 3-0 Vicryl sutures for the deep layer. At the skin, I closed the incisions with a 4-0 Monocryl suture. The incisions were then painted with Dermabond.  She had gauze place over the wounds and placed in a breast binder.   The patient tolerated the procedure well, was transported to the recovery room in good condition. Sponge and needle count were correct at the end of the case.   Final pathology is pending.   [name], MD, [facility] Pager: [phone] Office phone:  [phone]

## 2016-02-04 NOTE — Anesthesia Postprocedure Evaluation (Signed)
Anesthesia Post Note  Patient: [name]  Procedure(s) Performed: Procedure(s) (LRB): LEFT BREAST LUMPECTOMY WITH 2 NEEDLE LOCALIZATIONS AND LEFT AXILLARY SENTINEL LYMPH NODE BIOPSY (Left)  Patient location during evaluation: PACU Anesthesia Type: General and Regional Level of consciousness: awake and alert Pain management: pain level controlled Vital Signs Assessment: post-procedure vital signs reviewed and stable Respiratory status: spontaneous breathing, nonlabored ventilation and respiratory function stable Cardiovascular status: blood pressure returned to baseline and stable Postop Assessment: no signs of nausea or vomiting Anesthetic complications: no    Last Vitals:  Vitals:   02/04/16 1641 02/04/16 1715  BP:  101/60  Pulse: 88   Resp: 14 18  Temp:  36.7 C    Last Pain:  Vitals:   02/04/16 1715  TempSrc:   PainSc: 2                  Yulian Gosney,W. EDMOND

## 2016-02-04 NOTE — Interval H&P Note (Signed)
History and Physical Interval Note:  02/04/2016 1:16 PM  [name]  has presented today for surgery, with the diagnosis of LEFT BREAST CANCER 2 AREAS  The various methods of treatment have been discussed with the patient and family. Has wires in place.  Her best friend Bethena Roys and daughter Erline Levine (?) are at the bedside.   After consideration of risks, benefits and other options for treatment, the patient has consented to  Procedure(s): LEFT BREAST LUMPECTOMY WITH 2 NEEDLE LOCALIZATIONS AND LEFT AXILLARY SENTINEL LYMPH NODE BIOPSY (Left) as a surgical intervention .  The patient's history has been reviewed, patient examined, no change in status, stable for surgery.  I have reviewed the patient's chart and labs.  Questions were answered to the patient's satisfaction.     Kallista Pae H

## 2016-02-04 NOTE — Transfer of Care (Signed)
Immediate Anesthesia Transfer of Care Note  Patient: Aneyah Castello  Procedure(s) Performed: Procedure(s) with comments: LEFT BREAST LUMPECTOMY WITH 2 NEEDLE LOCALIZATIONS AND LEFT AXILLARY SENTINEL LYMPH NODE BIOPSY (Left) - LEFT BREAST LUMPECTOMY WITH 2 NEEDLE LOCALIZATIONS AND LEFT AXILLARY SENTINEL LYMPH NODE BIOPSY  Patient Location: PACU  Anesthesia Type:GA combined with regional for post-op pain  Level of Consciousness: sedated and confused  Airway & Oxygen Therapy: Patient Spontanous Breathing and Patient connected to face mask oxygen  Post-op Assessment: Report given to RN and Post -op Vital signs reviewed and stable  Post vital signs: Reviewed and stable  Last Vitals:  Vitals:   02/04/16 1317 02/04/16 1318  BP:    Pulse: 85 85  Resp: 13 17  Temp:      Last Pain:  Vitals:   02/04/16 1118  TempSrc: Oral  PainSc: 2          Complications: No apparent anesthesia complications

## 2016-02-04 NOTE — Anesthesia Procedure Notes (Signed)
Procedure Name: LMA Insertion Date/Time: 02/04/2016 1:43 PM Performed by: Denna Haggard D Pre-anesthesia Checklist: Patient identified, Emergency Drugs available, Suction available and Patient being monitored Patient Re-evaluated:Patient Re-evaluated prior to inductionOxygen Delivery Method: Circle system utilized Preoxygenation: Pre-oxygenation with 100% oxygen Intubation Type: IV induction Ventilation: Mask ventilation without difficulty LMA: LMA inserted LMA Size: 4.0 Number of attempts: 1 Airway Equipment and Method: Bite block Placement Confirmation: positive ETCO2 Tube secured with: Tape Dental Injury: Teeth and Oropharynx as per pre-operative assessment

## 2016-02-04 NOTE — Anesthesia Preprocedure Evaluation (Addendum)
Anesthesia Evaluation  Patient identified by MRN, date of birth, ID band Patient awake    Reviewed: Allergy & Precautions, H&P , NPO status , Patient's Chart, lab work & pertinent test results  History of Anesthesia Complications (+) PONV  Airway Mallampati: III  TM Distance: >3 FB Neck ROM: Full    Dental no notable dental hx. (+) Poor Dentition, Dental Advisory Given   Pulmonary Current Smoker,    Pulmonary exam normal breath sounds clear to auscultation       Cardiovascular hypertension, Pt. on medications  Rhythm:Regular Rate:Normal     Neuro/Psych  Headaches, Anxiety Depression    GI/Hepatic negative GI ROS, Neg liver ROS,   Endo/Other  negative endocrine ROS  Renal/GU negative Renal ROS  negative genitourinary   Musculoskeletal  (+) Arthritis , Osteoarthritis,    Abdominal   Peds  Hematology negative hematology ROS (+)   Anesthesia Other Findings   Reproductive/Obstetrics negative OB ROS                            Anesthesia Physical Anesthesia Plan  ASA: II  Anesthesia Plan: General and Regional   Post-op Pain Management: GA combined w/ Regional for post-op pain   Induction: Intravenous  Airway Management Planned: LMA  Additional Equipment:   Intra-op Plan:   Post-operative Plan: Extubation in OR  Informed Consent: I have reviewed the patients History and Physical, chart, labs and discussed the procedure including the risks, benefits and alternatives for the proposed anesthesia with the patient or authorized representative who has indicated his/her understanding and acceptance.   Dental advisory given  Plan Discussed with: CRNA  Anesthesia Plan Comments:         Anesthesia Quick Evaluation

## 2016-02-07 ENCOUNTER — Encounter (HOSPITAL_BASED_OUTPATIENT_CLINIC_OR_DEPARTMENT_OTHER): Payer: Self-pay | Admitting: Surgery

## 2016-02-11 ENCOUNTER — Telehealth: Payer: Self-pay | Admitting: Hematology and Oncology

## 2016-02-11 NOTE — Telephone Encounter (Signed)
Lvm dvising appt moved from 10/9 to 10/10 @ 3.15 due to md out of office.

## 2016-02-14 ENCOUNTER — Ambulatory Visit: Payer: No Typology Code available for payment source | Admitting: Hematology and Oncology

## 2016-02-15 ENCOUNTER — Ambulatory Visit (HOSPITAL_BASED_OUTPATIENT_CLINIC_OR_DEPARTMENT_OTHER): Payer: Medicaid Other | Admitting: Hematology and Oncology

## 2016-02-15 ENCOUNTER — Encounter: Payer: Self-pay | Admitting: Hematology and Oncology

## 2016-02-15 DIAGNOSIS — Z170 Estrogen receptor positive status [ER+]: Principal | ICD-10-CM

## 2016-02-15 DIAGNOSIS — C50412 Malignant neoplasm of upper-outer quadrant of left female breast: Secondary | ICD-10-CM

## 2016-02-15 NOTE — Assessment & Plan Note (Signed)
12/23/2015: Left breast 1.2 cm spiculated mass 2:00 position, 5 mm intramammary lymph node 2:00 position needs biopsy, 6 mm simple cyst at 1:00 position, T1c Nx stage IA 12/29/2015: Left breast biopsy UOQ 2:00 and centimeters from nipple: IDC, grade 1, ER 100%, PR 90%, Ki-67 10%, HER-2 negative ratio 1.16 02/04/2016: Left lumpectomy: IDC grade 1, 1.1 cm, ADH, ALH,0/3 lymph nodes negative, margins negative,ER 100%, PR 90%, HER-2 negative ratio 1.16, Ki-67 10%, T1 CN 0 stage IA  Recommendations: 1. Oncotype DX testing to determine if chemotherapy would be of any benefit followed by 2. Adjuvant radiation therapy followed by 3. Adjuvant antiestrogen therapy --------------------------------------------------------------------------------------------------------------------------------------------------------------- Pathology counseling: I discussed the final pathology report of the patient provided  a copy of this report. I discussed the margins as well as lymph node surgeries. We also discussed the final staging along with previously performed ER/PR and HER-2/neu testing.  Return to clinic based upon Oncotype DX testing

## 2016-02-15 NOTE — Progress Notes (Signed)
 Patient Care Team: Anthony Steele, FNP as PCP - General (Nurse Practitioner) David Newman, MD as Consulting Physician (General Surgery) Vinay Gudena, MD as Consulting Physician (Hematology and Oncology) Sarah Squire, MD as Attending Physician (Radiation Oncology)  DIAGNOSIS: Breast cancer of upper-outer quadrant of left female breast (HCC)   Staging form: Breast, AJCC 7th Edition   - Clinical stage from 01/05/2016: Stage IA (T1c, N0, M0) - Unsigned  SUMMARY OF ONCOLOGIC HISTORY:   Breast cancer of upper-outer quadrant of left female breast (HCC)   12/23/2015 Mammogram    Left breast 1.2 cm spiculated mass 2:00 position, 5 mm intramammary lymph node 2:00 position needs biopsy, 6 mm simple cyst at 1:00 position, T1c Nx stage IA      12/29/2015 Initial Diagnosis    Left breast biopsy UOQ 2:00 and centimeters from nipple: IDC, grade 1, ER 100%, PR 90%, Ki-67 10%, HER-2 negative ratio 1.16      02/04/2016 Surgery    Left lumpectomy: IDC grade 1, 1.1 cm, ADH, ALH,0/3 lymph nodes negative, margins negative,ER 100%, PR 90%, HER-2 negative ratio 1.16, Ki-67 10%, T1 CN 0 stage IA       CHIEF COMPLIANT: follow-up after recent lumpectomy  INTERVAL HISTORY: [name] is a 55-year-old with above-mentioned history left breast cancer underwent left lumpectomy. She is here for discussion regarding the pathology report. She is complaining of a lot of pain in the breast and axilla related to prior surgery. She does not want to take narcotic pain medications and hasn't been taking any medicine for the pain.  REVIEW OF SYSTEMS:   Constitutional: Denies fevers, chills or abnormal weight loss Eyes: Denies blurriness of vision Ears, nose, mouth, throat, and face: Denies mucositis or sore throat Respiratory: Denies cough, dyspnea or wheezes Cardiovascular: Denies palpitation, chest discomfort Gastrointestinal:  Denies nausea, heartburn or change in bowel habits Skin: Denies abnormal skin  rashes Lymphatics: Denies new lymphadenopathy or easy bruising Neurological:Denies numbness, tingling or new weaknesses Behavioral/Psych: Mood is stable, no new changes  Extremities: No lower extremity edema Breast: left lumpectomy All other systems were reviewed with the patient and are negative.  I have reviewed the past medical history, past surgical history, social history and family history with the patient and they are unchanged from previous note.  ALLERGIES:  is allergic to soap; adhesive [tape]; augmentin [amoxicillin-pot clavulanate]; cymbalta [duloxetine hcl]; latex; nsaids; oxycodone; and mupirocin.  MEDICATIONS:  Current Outpatient Prescriptions  Medication Sig Dispense Refill  . amitriptyline (ELAVIL) 25 MG tablet Take 25 mg by mouth at bedtime.    . amLODipine (NORVASC) 5 MG tablet Take 5 mg by mouth daily.    . atorvastatin (LIPITOR) 20 MG tablet Take 20 mg by mouth daily.    . cholecalciferol (VITAMIN D) 1000 units tablet Take 1,000 Units by mouth daily.    . HYDROcodone-acetaminophen (NORCO/VICODIN) 5-325 MG tablet Take 1-2 tablets by mouth every 6 (six) hours as needed. 30 tablet 0  . lisinopril-hydrochlorothiazide (PRINZIDE,ZESTORETIC) 20-12.5 MG tablet Take 1 tablet by mouth daily.    . meloxicam (MOBIC) 7.5 MG tablet Take 7.5 mg by mouth daily.    . Multiple Vitamins-Minerals (MULTIVITAMIN ADULT PO) Take by mouth.    . Probiotic Product (PROBIOTIC DAILY PO) Take by mouth.    . TURMERIC PO Take by mouth.     No current facility-administered medications for this visit.     PHYSICAL EXAMINATION: ECOG PERFORMANCE STATUS: 1 - Symptomatic but completely ambulatory  Vitals:   02/15/16 1541  BP:   128/78  Pulse: 99  Resp: 20  Temp: 98.6 F (37 C)   Filed Weights   02/15/16 1541  Weight: 213 lb 3.2 oz (96.7 kg)    GENERAL:alert, no distress and comfortable SKIN: skin color, texture, turgor are normal, no rashes or significant lesions EYES: normal, Conjunctiva  are pink and non-injected, sclera clear OROPHARYNX:no exudate, no erythema and lips, buccal mucosa, and tongue normal  NECK: supple, thyroid normal size, non-tender, without nodularity LYMPH:  no palpable lymphadenopathy in the cervical, axillary or inguinal LUNGS: clear to auscultation and percussion with normal breathing effort HEART: regular rate & rhythm and no murmurs and no lower extremity edema ABDOMEN:abdomen soft, non-tender and normal bowel sounds MUSCULOSKELETAL:no cyanosis of digits and no clubbing  NEURO: alert & oriented x 3 with fluent speech, no focal motor/sensory deficits EXTREMITIES: No lower extremity edema  LABORATORY DATA:  I have reviewed the data as listed   Chemistry      Component Value Date/Time   NA 137 02/01/2016 0740   K 3.9 02/01/2016 0740   CL 104 02/01/2016 0740   CO2 24 02/01/2016 0740   BUN 18 02/01/2016 0740   CREATININE 1.07 (H) 02/01/2016 0740      Component Value Date/Time   CALCIUM 9.5 02/01/2016 0740   ALKPHOS 115 04/18/2013 0954   AST 29 04/18/2013 0954   ALT 41 (H) 04/18/2013 0954   BILITOT 0.4 04/18/2013 0954       Lab Results  Component Value Date   WBC 17.0 (H) 04/29/2013   HGB 13.4 04/29/2013   HCT 42.0 04/29/2013   MCV 96.3 04/29/2013   PLT 225 04/29/2013   NEUTROABS 9.7 (H) 08/13/2011     ASSESSMENT & PLAN:  Breast cancer of upper-outer quadrant of left female breast (HCC) 12/23/2015: Left breast 1.2 cm spiculated mass 2:00 position, 5 mm intramammary lymph node 2:00 position needs biopsy, 6 mm simple cyst at 1:00 position, T1c Nx stage IA 12/29/2015: Left breast biopsy UOQ 2:00 and centimeters from nipple: IDC, grade 1, ER 100%, PR 90%, Ki-67 10%, HER-2 negative ratio 1.16 02/04/2016: Left lumpectomy: IDC grade 1, 1.1 cm, ADH, ALH,0/3 lymph nodes negative, margins negative,ER 100%, PR 90%, HER-2 negative ratio 1.16, Ki-67 10%, T1 CN 0 stage IA  Recommendations: 1. Oncotype DX testing to determine if chemotherapy  would be of any benefit followed by 2. Adjuvant radiation therapy followed by 3. Adjuvant antiestrogen therapy --------------------------------------------------------------------------------------------------------------------------------------------------------------- Pathology counseling: I discussed the final pathology report of the patient provided  a copy of this report. I discussed the margins as well as lymph node surgeries. We also discussed the final staging along with previously performed ER/PR and HER-2/neu testing.  Return to clinic based upon Oncotype DX testing   No orders of the defined types were placed in this encounter.  The patient has a good understanding of the overall plan. she agrees with it. she will call with any problems that may develop before the next visit here.   Gudena, Vinay K, MD 02/15/16    

## 2016-02-16 ENCOUNTER — Telehealth: Payer: Self-pay | Admitting: *Deleted

## 2016-02-16 NOTE — Telephone Encounter (Signed)
Received order for Oncotype testing. Requisition sent to pathology. Received by Tammy.

## 2016-02-17 ENCOUNTER — Telehealth: Payer: Self-pay | Admitting: *Deleted

## 2016-02-17 NOTE — Telephone Encounter (Signed)
John called from Oncotype testing and wants to know if patient is self pay? If so, he needs to fax over an economic hardship form. Return number is [phone] ext. [phone].

## 2016-02-18 NOTE — Telephone Encounter (Signed)
"  [name] with [employer] calling for fax number to send form for economic hardship."  Provided fax number for Breast POD 2.

## 2016-02-20 ENCOUNTER — Encounter (HOSPITAL_COMMUNITY): Payer: Self-pay | Admitting: Emergency Medicine

## 2016-02-20 ENCOUNTER — Emergency Department (HOSPITAL_COMMUNITY): Payer: Medicaid Other

## 2016-02-20 ENCOUNTER — Inpatient Hospital Stay (HOSPITAL_COMMUNITY)
Admission: EM | Admit: 2016-02-20 | Discharge: 2016-02-25 | DRG: 862 | Disposition: A | Discharge status: Home / Self Care | Payer: Medicaid Other | Attending: Surgery | Admitting: Surgery

## 2016-02-20 DIAGNOSIS — K52832 Lymphocytic colitis: Secondary | ICD-10-CM | POA: Diagnosis present

## 2016-02-20 DIAGNOSIS — D649 Anemia, unspecified: Secondary | ICD-10-CM | POA: Diagnosis present

## 2016-02-20 DIAGNOSIS — Z91040 Latex allergy status: Secondary | ICD-10-CM

## 2016-02-20 DIAGNOSIS — N611 Abscess of the breast and nipple: Secondary | ICD-10-CM | POA: Diagnosis present

## 2016-02-20 DIAGNOSIS — Z79899 Other long term (current) drug therapy: Secondary | ICD-10-CM

## 2016-02-20 DIAGNOSIS — A4101 Sepsis due to Methicillin susceptible Staphylococcus aureus: Secondary | ICD-10-CM | POA: Diagnosis present

## 2016-02-20 DIAGNOSIS — M5430 Sciatica, unspecified side: Secondary | ICD-10-CM | POA: Diagnosis present

## 2016-02-20 DIAGNOSIS — E7800 Pure hypercholesterolemia, unspecified: Secondary | ICD-10-CM | POA: Diagnosis present

## 2016-02-20 DIAGNOSIS — Z888 Allergy status to other drugs, medicaments and biological substances status: Secondary | ICD-10-CM

## 2016-02-20 DIAGNOSIS — Z981 Arthrodesis status: Secondary | ICD-10-CM

## 2016-02-20 DIAGNOSIS — R509 Fever, unspecified: Secondary | ICD-10-CM

## 2016-02-20 DIAGNOSIS — I10 Essential (primary) hypertension: Secondary | ICD-10-CM | POA: Diagnosis present

## 2016-02-20 DIAGNOSIS — C50412 Malignant neoplasm of upper-outer quadrant of left female breast: Secondary | ICD-10-CM | POA: Diagnosis present

## 2016-02-20 DIAGNOSIS — Z880 Allergy status to penicillin: Secondary | ICD-10-CM

## 2016-02-20 DIAGNOSIS — Z803 Family history of malignant neoplasm of breast: Secondary | ICD-10-CM

## 2016-02-20 DIAGNOSIS — F17210 Nicotine dependence, cigarettes, uncomplicated: Secondary | ICD-10-CM | POA: Diagnosis present

## 2016-02-20 DIAGNOSIS — Z885 Allergy status to narcotic agent status: Secondary | ICD-10-CM

## 2016-02-20 DIAGNOSIS — N61 Mastitis without abscess: Secondary | ICD-10-CM

## 2016-02-20 DIAGNOSIS — F419 Anxiety disorder, unspecified: Secondary | ICD-10-CM | POA: Diagnosis present

## 2016-02-20 DIAGNOSIS — Z91048 Other nonmedicinal substance allergy status: Secondary | ICD-10-CM

## 2016-02-20 DIAGNOSIS — Z9851 Tubal ligation status: Secondary | ICD-10-CM

## 2016-02-20 DIAGNOSIS — N179 Acute kidney failure, unspecified: Secondary | ICD-10-CM | POA: Diagnosis not present

## 2016-02-20 DIAGNOSIS — T814XXA Infection following a procedure, initial encounter: Principal | ICD-10-CM | POA: Diagnosis present

## 2016-02-20 DIAGNOSIS — Z9889 Other specified postprocedural states: Secondary | ICD-10-CM

## 2016-02-20 DIAGNOSIS — R6521 Severe sepsis with septic shock: Secondary | ICD-10-CM

## 2016-02-20 DIAGNOSIS — A419 Sepsis, unspecified organism: Secondary | ICD-10-CM

## 2016-02-20 LAB — URINALYSIS, ROUTINE W REFLEX MICROSCOPIC
BILIRUBIN URINE: NEGATIVE
GLUCOSE, UA: NEGATIVE mg/dL
HGB URINE DIPSTICK: NEGATIVE
Ketones, ur: NEGATIVE mg/dL
Leukocytes, UA: NEGATIVE
Nitrite: NEGATIVE
Protein, ur: NEGATIVE mg/dL
SPECIFIC GRAVITY, URINE: 1.018 (ref 1.005–1.030)
pH: 5.5 (ref 5.0–8.0)

## 2016-02-20 LAB — CBC WITH DIFFERENTIAL/PLATELET
Basophils Absolute: 0.1 10*3/uL (ref 0.0–0.1)
Basophils Relative: 0 %
EOS PCT: 1 %
Eosinophils Absolute: 0.3 10*3/uL (ref 0.0–0.7)
HEMATOCRIT: 39.3 % (ref 36.0–46.0)
Hemoglobin: 13.5 g/dL (ref 12.0–15.0)
LYMPHS ABS: 1.7 10*3/uL (ref 0.7–4.0)
LYMPHS PCT: 7 %
MCH: 30.8 pg (ref 26.0–34.0)
MCHC: 34.4 g/dL (ref 30.0–36.0)
MCV: 89.7 fL (ref 78.0–100.0)
MONO ABS: 1.4 10*3/uL — AB (ref 0.1–1.0)
MONOS PCT: 6 %
NEUTROS ABS: 19.2 10*3/uL — AB (ref 1.7–7.7)
Neutrophils Relative %: 86 %
Platelets: 254 10*3/uL (ref 150–400)
RBC: 4.38 MIL/uL (ref 3.87–5.11)
RDW: 13.3 % (ref 11.5–15.5)
WBC: 22.6 10*3/uL — ABNORMAL HIGH (ref 4.0–10.5)

## 2016-02-20 LAB — COMPREHENSIVE METABOLIC PANEL
ALBUMIN: 4.2 g/dL (ref 3.5–5.0)
ALT: 25 U/L (ref 14–54)
AST: 23 U/L (ref 15–41)
Alkaline Phosphatase: 90 U/L (ref 38–126)
Anion gap: 11 (ref 5–15)
BILIRUBIN TOTAL: 0.8 mg/dL (ref 0.3–1.2)
BUN: 17 mg/dL (ref 6–20)
CHLORIDE: 102 mmol/L (ref 101–111)
CO2: 23 mmol/L (ref 22–32)
CREATININE: 0.83 mg/dL (ref 0.44–1.00)
Calcium: 9.2 mg/dL (ref 8.9–10.3)
GFR calc Af Amer: >60 mL/min (ref >60)
GLUCOSE: 130 mg/dL — AB (ref 65–99)
POTASSIUM: 3.8 mmol/L (ref 3.5–5.1)
Sodium: 136 mmol/L (ref 135–145)
Total Protein: 7.1 g/dL (ref 6.5–8.1)

## 2016-02-20 LAB — I-STAT CG4 LACTIC ACID, ED: Lactic Acid, Venous: 1.75 mmol/L (ref 0.5–1.9)

## 2016-02-20 LAB — TROPONIN I: Troponin I: <0.03 ng/mL (ref <0.03)

## 2016-02-20 MED ORDER — HYDROCHLOROTHIAZIDE 12.5 MG PO CAPS
12.5000 mg | ORAL_CAPSULE | Freq: Every day | ORAL | Status: DC
  Administered 2016-02-20 – 2016-02-21 (×2): 12.5 mg via ORAL
  Filled 2016-02-20 (×2): qty 1

## 2016-02-20 MED ORDER — ACETAMINOPHEN 500 MG PO TABS
1000.0000 mg | ORAL_TABLET | Freq: Once | ORAL | Status: AC
  Administered 2016-02-20: 1000 mg via ORAL
  Filled 2016-02-20: qty 2

## 2016-02-20 MED ORDER — LISINOPRIL-HYDROCHLOROTHIAZIDE 20-12.5 MG PO TABS: 1.0000 | ORAL_TABLET | Freq: Every day | ORAL | Status: DC

## 2016-02-20 MED ORDER — METRONIDAZOLE IN NACL 5-0.79 MG/ML-% IV SOLN
500.0000 mg | Freq: Once | INTRAVENOUS | Status: DC
  Filled 2016-02-20: qty 100

## 2016-02-20 MED ORDER — VANCOMYCIN HCL IN DEXTROSE 1-5 GM/200ML-% IV SOLN
1000.0000 mg | Freq: Once | INTRAVENOUS | Status: AC
  Administered 2016-02-20: 1000 mg via INTRAVENOUS
  Filled 2016-02-20: qty 200

## 2016-02-20 MED ORDER — LISINOPRIL 10 MG PO TABS
20.0000 mg | ORAL_TABLET | Freq: Every day | ORAL | Status: DC
  Administered 2016-02-20 – 2016-02-21 (×2): 20 mg via ORAL
  Filled 2016-02-20 (×2): qty 1

## 2016-02-20 MED ORDER — ONDANSETRON HCL 4 MG/2ML IJ SOLN
4.0000 mg | Freq: Once | INTRAMUSCULAR | Status: AC
  Administered 2016-02-20: 4 mg via INTRAVENOUS
  Filled 2016-02-20: qty 2

## 2016-02-20 MED ORDER — KCL IN DEXTROSE-NACL 20-5-0.9 MEQ/L-%-% IV SOLN
INTRAVENOUS | Status: DC
  Administered 2016-02-20: 1000 mL via INTRAVENOUS
  Administered 2016-02-21: 12:00:00 via INTRAVENOUS
  Filled 2016-02-20 (×4): qty 1000

## 2016-02-20 MED ORDER — MORPHINE SULFATE (PF) 4 MG/ML IV SOLN
4.0000 mg | Freq: Once | INTRAVENOUS | Status: AC
Start: 2016-02-20 — End: 2016-02-20
  Administered 2016-02-20: 4 mg via INTRAVENOUS
  Filled 2016-02-20: qty 1

## 2016-02-20 MED ORDER — AMITRIPTYLINE HCL 25 MG PO TABS
25.0000 mg | ORAL_TABLET | Freq: Every day | ORAL | Status: DC
  Administered 2016-02-20 – 2016-02-24 (×4): 25 mg via ORAL
  Filled 2016-02-20 (×4): qty 1

## 2016-02-20 MED ORDER — RISAQUAD PO CAPS
1.0000 | ORAL_CAPSULE | Freq: Every day | ORAL | Status: DC
  Administered 2016-02-21 – 2016-02-25 (×6): 1 via ORAL
  Filled 2016-02-20 (×6): qty 1

## 2016-02-20 MED ORDER — ATORVASTATIN CALCIUM 10 MG PO TABS
20.0000 mg | ORAL_TABLET | Freq: Every day | ORAL | Status: DC
  Administered 2016-02-20 – 2016-02-21 (×2): 20 mg via ORAL
  Filled 2016-02-20 (×2): qty 2

## 2016-02-20 MED ORDER — PROBIOTIC DAILY PO CAPS: ORAL_CAPSULE | Freq: Every day | ORAL | Status: DC

## 2016-02-20 MED ORDER — AMLODIPINE BESYLATE 5 MG PO TABS
5.0000 mg | ORAL_TABLET | Freq: Every day | ORAL | Status: DC
  Administered 2016-02-21 – 2016-02-25 (×6): 5 mg via ORAL
  Filled 2016-02-20 (×6): qty 1

## 2016-02-20 MED ORDER — VANCOMYCIN HCL IN DEXTROSE 1-5 GM/200ML-% IV SOLN
1000.0000 mg | Freq: Two times a day (BID) | INTRAVENOUS | Status: DC
  Administered 2016-02-21: 1000 mg via INTRAVENOUS
  Filled 2016-02-20: qty 200

## 2016-02-20 MED ORDER — DEXTROSE 5 % IV SOLN
2.0000 g | Freq: Once | INTRAVENOUS | Status: AC
  Administered 2016-02-20: 2 g via INTRAVENOUS
  Filled 2016-02-20: qty 2

## 2016-02-20 MED ORDER — MELOXICAM 7.5 MG PO TABS
7.5000 mg | ORAL_TABLET | Freq: Every day | ORAL | Status: DC
  Administered 2016-02-20 – 2016-02-21 (×2): 7.5 mg via ORAL
  Filled 2016-02-20 (×2): qty 1

## 2016-02-20 MED ORDER — HYDROCODONE-ACETAMINOPHEN 5-325 MG PO TABS
1.0000 | ORAL_TABLET | Freq: Four times a day (QID) | ORAL | Status: DC | PRN
  Administered 2016-02-20 – 2016-02-21 (×2): 1 via ORAL
  Administered 2016-02-21 – 2016-02-25 (×12): 2 via ORAL
  Filled 2016-02-20 (×8): qty 2
  Filled 2016-02-20 (×2): qty 1
  Filled 2016-02-20 (×5): qty 2

## 2016-02-20 MED ORDER — ONDANSETRON 4 MG PO TBDP: 4.0000 mg | ORAL_TABLET | Freq: Four times a day (QID) | ORAL | Status: DC | PRN

## 2016-02-20 MED ORDER — HEPARIN SODIUM (PORCINE) 5000 UNIT/ML IJ SOLN
5000.0000 [IU] | Freq: Three times a day (TID) | INTRAMUSCULAR | Status: DC
  Administered 2016-02-20 – 2016-02-23 (×9): 5000 [IU] via SUBCUTANEOUS
  Filled 2016-02-20 (×13): qty 1

## 2016-02-20 MED ORDER — FENTANYL CITRATE (PF) 100 MCG/2ML IJ SOLN
25.0000 ug | INTRAMUSCULAR | Status: DC | PRN
  Administered 2016-02-21 – 2016-02-22 (×2): 50 ug via INTRAVENOUS
  Administered 2016-02-22: 25 ug via INTRAVENOUS
  Administered 2016-02-22 – 2016-02-24 (×5): 50 ug via INTRAVENOUS
  Administered 2016-02-24: 25 ug via INTRAVENOUS
  Administered 2016-02-24 – 2016-02-25 (×2): 50 ug via INTRAVENOUS
  Filled 2016-02-20 (×12): qty 2

## 2016-02-20 MED ORDER — FAMOTIDINE IN NACL 20-0.9 MG/50ML-% IV SOLN
20.0000 mg | Freq: Two times a day (BID) | INTRAVENOUS | Status: DC
  Administered 2016-02-20 – 2016-02-24 (×8): 20 mg via INTRAVENOUS
  Filled 2016-02-20 (×8): qty 50

## 2016-02-20 MED ORDER — ONDANSETRON HCL 4 MG/2ML IJ SOLN: 4.0000 mg | Freq: Four times a day (QID) | INTRAMUSCULAR | Status: DC | PRN

## 2016-02-20 NOTE — H&P (Signed)
[name] is an 55 y.o. female.   Chief Complaint: fever HPI:  The patient is a 55 year old white female who is about 2 weeks status post left breast lumpectomy and sentinel node mapping by Dr. Lucia Gaskins for a left breast cancer. Over the last 2-3 days she has noticed some redness and a rash in the left breast. The rash seems to be centered around the incision and also has a few dots on the left upper arm. The cellulitis is more diffusely throughout the left breast.  Past Medical History:  Diagnosis Date  . Anxiety   . Arthritis    lower back  . Breast cancer of upper-outer quadrant of left female breast ([place]) 01/03/2016  . Complication of anesthesia    hx. of combativeness as child; had severe migraine headache day after back surgery  . Cough due to ACE inhibitor 01/28/2016  . Depression   . Eczema    right hip  . High cholesterol   . Hypertension    states under control with meds., has been on med. since age 60  . Lymphocytic colitis   . Migraines   . PONV (postoperative nausea and vomiting)   . Sciatica   . Sinus drainage 01/28/2016  . Urinary urgency     Past Surgical History:  Procedure Laterality Date  . BREAST LUMPECTOMY WITH NEEDLE LOCALIZATION AND AXILLARY SENTINEL LYMPH NODE BX Left 02/04/2016   Procedure: LEFT BREAST LUMPECTOMY WITH 2 NEEDLE LOCALIZATIONS AND LEFT AXILLARY SENTINEL LYMPH NODE BIOPSY;  Surgeon: [name], MD;  Location: [facility];  Service: General;  Laterality: Left;  LEFT BREAST LUMPECTOMY WITH 2 NEEDLE LOCALIZATIONS AND LEFT AXILLARY SENTINEL LYMPH NODE BIOPSY  . [place]  . COLONOSCOPY     Hx: of  . CYSTO  09/29/2005   with anterior repair  . ENDOMETRIAL ABLATION  09/29/2005  . INCONTINENCE SURGERY  09/29/2005  . LUMBAR FUSION  04/28/2013   L3-4  . TONSILLECTOMY     age 20  . TUBAL LIGATION    . WISDOM TOOTH EXTRACTION     as a teenager    Family History  Problem Relation Age of Onset  . Colon  cancer Father     22-65  . Hypertension Father   . Colon polyps    . Diabetes    . Breast cancer Paternal Aunt    Social History:  reports that she has been smoking Cigarettes.  She has a 15.00 pack-year smoking history. She has never used smokeless tobacco. She reports that she does not drink alcohol or use drugs.  Allergies:  Allergies  Allergen Reactions  . Soap Shortness Of Breath and Rash    IVORY SOAP  . Adhesive [Tape] Other (See Comments)    BLISTERS  . Augmentin [Amoxicillin-Pot Clavulanate] Nausea And Vomiting    Has patient had a PCN reaction causing immediate rash, facial/tongue/throat swelling, SOB or lightheadedness with hypotension: Unknown Has patient had a PCN reaction causing severe rash involving mucus membranes or skin necrosis: Unknown Has patient had a PCN reaction that required hospitalization Unknown Has patient had a PCN reaction occurring within the last 10 years: Unknown If all of the above answers are "NO", then may proceed with Cephalosporin use.   Marland Kitchen Cymbalta [Duloxetine Hcl] Nausea Only  . Latex Other (See Comments)    BLISTERS  . Nsaids Other (See Comments)    LYMPHOCYTIC COLITIS  . Oxycodone Nausea Only    Not on an empty stomach  .  Mupirocin Rash     (Not in a hospital admission)  Results for orders placed or performed during the hospital encounter of 02/20/16 (from the past 48 hour(s))  Comprehensive metabolic panel     Status: Abnormal   Collection Time: 02/20/16  4:30 PM  Result Value Ref Range   Sodium 136 135 - 145 mmol/L   Potassium 3.8 3.5 - 5.1 mmol/L   Chloride 102 101 - 111 mmol/L   CO2 23 22 - 32 mmol/L   Glucose, Bld 130 (H) 65 - 99 mg/dL   BUN 17 6 - 20 mg/dL   Creatinine, Ser 0.83 0.44 - 1.00 mg/dL   Calcium 9.2 8.9 - 10.3 mg/dL   Total Protein 7.1 6.5 - 8.1 g/dL   Albumin 4.2 3.5 - 5.0 g/dL   AST 23 15 - 41 U/L   ALT 25 14 - 54 U/L   Alkaline Phosphatase 90 38 - 126 U/L   Total Bilirubin 0.8 0.3 - 1.2 mg/dL   GFR  calc non Af Amer >60 >60 mL/min   GFR calc Af Amer >60 >60 mL/min    Comment: (NOTE) The eGFR has been calculated using the CKD EPI equation. This calculation has not been validated in all clinical situations. eGFR's persistently <60 mL/min signify possible Chronic Kidney Disease.    Anion gap 11 5 - 15  CBC WITH DIFFERENTIAL     Status: Abnormal   Collection Time: 02/20/16  4:40 PM  Result Value Ref Range   WBC 22.6 (H) 4.0 - 10.5 K/uL   RBC 4.38 3.87 - 5.11 MIL/uL   Hemoglobin 13.5 12.0 - 15.0 g/dL   HCT 39.3 36.0 - 46.0 %   MCV 89.7 78.0 - 100.0 fL   MCH 30.8 26.0 - 34.0 pg   MCHC 34.4 30.0 - 36.0 g/dL   RDW 13.3 11.5 - 15.5 %   Platelets 254 150 - 400 K/uL   Neutrophils Relative % 86 %   Neutro Abs 19.2 (H) 1.7 - 7.7 K/uL   Lymphocytes Relative 7 %   Lymphs Abs 1.7 0.7 - 4.0 K/uL   Monocytes Relative 6 %   Monocytes Absolute 1.4 (H) 0.1 - 1.0 K/uL   Eosinophils Relative 1 %   Eosinophils Absolute 0.3 0.0 - 0.7 K/uL   Basophils Relative 0 %   Basophils Absolute 0.1 0.0 - 0.1 K/uL  Troponin I     Status: None   Collection Time: 02/20/16  4:40 PM  Result Value Ref Range   Troponin I <0.03 <0.03 ng/mL  I-Stat CG4 Lactic Acid, ED  (not at  [facility])     Status: None   Collection Time: 02/20/16  4:48 PM  Result Value Ref Range   Lactic Acid, Venous 1.75 0.5 - 1.9 mmol/L   Dg Chest 2 View  Result Date: 02/20/2016 CLINICAL DATA:  Fever. EXAM: CHEST  2 VIEW COMPARISON:  None. FINDINGS: Cardiomegaly noted. Right lower lobe airspace disease is compatible with pneumonia. There is no evidence of pulmonary edema, suspicious pulmonary nodule/mass, pleural effusion, or pneumothorax. No acute bony abnormalities are identified. IMPRESSION: Right lower lobe airspace disease likely representing pneumonia. Radiographic follow-up to resolution is recommended. Cardiomegaly. Electronically Signed   By: Margarette Canada M.D.   On: 02/20/2016 18:03    Review of Systems  Constitutional: Positive for  chills and fever.  HENT: Negative.   Eyes: Negative.   Respiratory: Negative.   Cardiovascular: Negative.   Gastrointestinal: Negative.   Genitourinary: Negative.   Musculoskeletal:  Negative.   Skin: Positive for itching and rash.  Neurological: Negative.   Endo/Heme/Allergies: Negative.   Psychiatric/Behavioral: Negative.     Blood pressure 164/100, pulse 118, temperature 99.8 F (37.7 C), temperature source Oral, resp. rate 22, height _0  (1.727 m), weight 96.6 kg (213 lb), SpO2 100 %. Physical Exam  Constitutional: She is oriented to person, place, and time. She appears well-developed and well-nourished.  HENT:  Head: Normocephalic and atraumatic.  Eyes: Conjunctivae and EOM are normal. Pupils are equal, round, and reactive to light.  Neck: Normal range of motion. Neck supple.  Cardiovascular: Normal rate, regular rhythm and normal heart sounds.   Respiratory: Effort normal and breath sounds normal.  There is cellulitis of the left breast with a rash surrounding the incision. There is no palpable obvious abscess.  GI: Soft. Bowel sounds are normal.  Musculoskeletal: Normal range of motion.  Neurological: She is alert and oriented to person, place, and time.  Skin: Skin is warm and dry.  Psychiatric: She has a normal mood and affect. Her behavior is normal.     Assessment/Plan  The patient appears to have cellulitis of the left breast related to her recent surgery. I believe the rash looks more like a contact dermatitis and less likely result of an infection. Since I do not palpate a big abscess at the operative site and would recommend that she be admitted for broad-spectrum and IV antibiotic therapy. We will watch her cellulitis closely. If it improves on antibiotics we will switch her to then to oral antibiotics when she has improved. We will watch her closely. I will notify Dr. Rozann Lesches, MD 02/20/2016, 6:07 PM

## 2016-02-20 NOTE — ED Notes (Signed)
Pt. Tells me she feels "much better" and I have just given report to Beverlee Nims, RN on [address]. Will transport shortly.

## 2016-02-20 NOTE — ED Triage Notes (Addendum)
Pt reports fever 101.8 at home. Pt had lumpectomy on 9/29 and reports pain and rash at incision site. Denies drainage at site and N/V/D. Hx breast cancer

## 2016-02-20 NOTE — Progress Notes (Signed)
Pharmacy Antibiotic Follow-up Note  [name] is a 55 y.o. year-old female admitted on 02/20/2016.  The patient is currently on day 1 of Vancomycin for cellulitis.  Assessment/Plan: Vancomycin 1gm in ED, then 1gm IV every 12 hours.  Goal trough 10-15 mcg/mL.  Temp (24hrs), Avg:101 F (38.3 C), Min:99.8 F (37.7 C), Max:102.2 F (39 C)   Recent Labs Lab 02/20/16 1640  WBC 22.6*    Recent Labs Lab 02/20/16 1630  CREATININE 0.83   Estimated Creatinine Clearance: 94.2 mL/min (by C-G formula based on SCr of 0.83 mg/dL).    Allergies  Allergen Reactions  . Soap Shortness Of Breath and Rash    IVORY SOAP  . Adhesive [Tape] Other (See Comments)    BLISTERS  . Augmentin [Amoxicillin-Pot Clavulanate] Nausea And Vomiting    Has patient had a PCN reaction causing immediate rash, facial/tongue/throat swelling, SOB or lightheadedness with hypotension: Unknown Has patient had a PCN reaction causing severe rash involving mucus membranes or skin necrosis: Unknown Has patient had a PCN reaction that required hospitalization Unknown Has patient had a PCN reaction occurring within the last 10 years: Unknown If all of the above answers are "NO", then may proceed with Cephalosporin use.   Marland Kitchen Cymbalta [Duloxetine Hcl] Nausea Only  . Latex Other (See Comments)    BLISTERS  . Nsaids Other (See Comments)    LYMPHOCYTIC COLITIS  . Oxycodone Nausea Only    Not on an empty stomach  . Mupirocin Rash   Antimicrobials this admission: 10!5 Vancomycin  >>   Levels/dose changes this admission:  Microbiology results: 10/15 BCx: sent  Thank you for allowing pharmacy to be a part of this patient's care.  Minda Ditto PharmD 02/20/2016 7:35 PM

## 2016-02-20 NOTE — ED Provider Notes (Signed)
[facility] DEPT Provider Note   CSN: [id number] Arrival date & time: 02/20/16  1536     History   Chief Complaint Chief Complaint  Patient presents with  . Fever    HPI [name] is a 55 y.o. female.  Pt presents to the ED today with a fever.  The pt had a left breast lumpectomy by Dr. Lucia Gaskins on 9/29.  The pt said that she has noticed some redness around the surgical site and saw her surgeon yesterday.  She said she was told that it was lymphedema.  The pt developed a fever today and worsening of redness and pain.  The pt's temp was 101.8 at home for which she took 400 mg of ibuprofen.  The pt said she feels "terrible."      Past Medical History:  Diagnosis Date  . Anxiety   . Arthritis    lower back  . Breast cancer of upper-outer quadrant of left female breast ([place]) 01/03/2016  . Complication of anesthesia    hx. of combativeness as child; had severe migraine headache day after back surgery  . Cough due to ACE inhibitor 01/28/2016  . Depression   . Eczema    right hip  . High cholesterol   . Hypertension    states under control with meds., has been on med. since age 20  . Lymphocytic colitis   . Migraines   . PONV (postoperative nausea and vomiting)   . Sciatica   . Sinus drainage 01/28/2016  . Urinary urgency     Patient Active Problem List   Diagnosis Date Noted  . Breast cancer of upper-outer quadrant of left female breast ([place]) 01/03/2016  . 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 Spondylolisthesis of lumbar region 04/28/2013    Class: Chronic  . Dehydration 08/09/2011  . Hypertension 08/09/2011  . Hyperlipidemia LDL goal < 100 08/09/2011  . Colitis 08/09/2011  . TIA (transient ischemic attack) 08/09/2011  . ANXIETY 02/24/2010  . DEPRESSION 02/24/2010  . FLATULENCE-GAS-BLOATING 02/07/2008  . OTH&UNSPEC NONINFECTIOUS GASTROENTERITIS&COLITIS 12/12/2007  . DIARRHEA 10/10/2007  . ABDOMINAL  PAIN, GENERALIZED 10/10/2007    Past Surgical History:  Procedure Laterality Date  . BREAST LUMPECTOMY WITH NEEDLE LOCALIZATION AND AXILLARY SENTINEL LYMPH NODE BX Left 02/04/2016   Procedure: LEFT BREAST LUMPECTOMY WITH 2 NEEDLE LOCALIZATIONS AND LEFT AXILLARY SENTINEL LYMPH NODE BIOPSY;  Surgeon: [name], MD;  Location: [facility];  Service: General;  Laterality: Left;  LEFT BREAST LUMPECTOMY WITH 2 NEEDLE LOCALIZATIONS AND LEFT AXILLARY SENTINEL LYMPH NODE BIOPSY  . [place]  . COLONOSCOPY     Hx: of  . CYSTO  09/29/2005   with anterior repair  . ENDOMETRIAL ABLATION  09/29/2005  . INCONTINENCE SURGERY  09/29/2005  . LUMBAR FUSION  04/28/2013   L3-4  . TONSILLECTOMY     age 61  . TUBAL LIGATION    . WISDOM TOOTH EXTRACTION     as a teenager    OB History    Gravida Para Term Preterm AB Living   3 2 2   1 2    SAB TAB Ectopic Multiple Live Births   1               Home Medications    Prior to Admission medications   Medication Sig Start Date End Date Taking? Authorizing Provider  amitriptyline (ELAVIL) 25 MG tablet Take 25 mg by mouth at bedtime.  Yes Historical Provider, MD  amLODipine (NORVASC) 5 MG tablet Take 5 mg by mouth daily.   Yes Historical Provider, MD  aspirin-acetaminophen-caffeine (EXCEDRIN MIGRAINE) [phone] MG tablet Take 2 tablets by mouth every 6 (six) hours as needed for headache.   Yes Historical Provider, MD  atorvastatin (LIPITOR) 20 MG tablet Take 20 mg by mouth daily.   Yes Historical Provider, MD  cholecalciferol (VITAMIN D) 1000 units tablet Take 1,000 Units by mouth daily.   Yes Historical Provider, MD  HYDROcodone-acetaminophen (NORCO/VICODIN) 5-325 MG tablet Take 1-2 tablets by mouth every 6 (six) hours as needed. 02/04/16  Yes [name], MD  ibuprofen (ADVIL,MOTRIN) 200 MG tablet Take 400 mg by mouth every 6 (six) hours as needed for fever.   Yes Historical Provider, MD    lisinopril-hydrochlorothiazide (PRINZIDE,ZESTORETIC) 20-12.5 MG tablet Take 1 tablet by mouth daily.   Yes Historical Provider, MD  meloxicam (MOBIC) 7.5 MG tablet Take 7.5 mg by mouth daily.   Yes Historical Provider, MD  Multiple Vitamins-Minerals (MULTIVITAMIN ADULT PO) Take 1 tablet by mouth daily.    Yes Historical Provider, MD  Probiotic Product (PROBIOTIC DAILY PO) Take 1 capsule by mouth daily.    Yes Historical Provider, MD  TURMERIC PO Take 1 tablet by mouth daily.    Yes Historical Provider, MD    Family History Family History  Problem Relation Age of Onset  . Colon cancer Father     74-65  . Hypertension Father   . Colon polyps    . Diabetes    . Breast cancer Paternal Aunt     Social History Social History  Substance Use Topics  . Smoking status: Current Every Day Smoker    Packs/day: 1.00    Years: 15.00    Types: Cigarettes  . Smokeless tobacco: Never Used  . Alcohol use No     Allergies   Soap; Adhesive [tape]; Augmentin [amoxicillin-pot clavulanate]; Cymbalta [duloxetine hcl]; Latex; Nsaids; Oxycodone; and Mupirocin   Review of Systems Review of Systems  Constitutional: Positive for chills and fever.  Skin: Positive for rash and wound.  All other systems reviewed and are negative.    Physical Exam Updated Vital Signs BP 118/75 (BP Location: Right Arm)   Pulse 114   Temp 102.2 F (39 C) (Oral)   Resp 18   Ht 5\' 8"  (1.727 m)   Wt 213 lb (96.6 kg)   SpO2 97%   BMI 32.39 kg/m   Physical Exam  Constitutional: She is oriented to person, place, and time. She appears well-developed and well-nourished.  HENT:  Head: Normocephalic and atraumatic.  Right Ear: External ear normal.  Left Ear: External ear normal.  Nose: Nose normal.  Mouth/Throat: Oropharynx is clear and moist.  Eyes: Conjunctivae and EOM are normal. Pupils are equal, round, and reactive to light.  Neck: Normal range of motion. Neck supple.  Cardiovascular: Regular rhythm, normal  heart sounds and intact distal pulses.  Tachycardia present.   Pulmonary/Chest: Effort normal and breath sounds normal.  Abdominal: Soft. Bowel sounds are normal.  Musculoskeletal: Normal range of motion.  Neurological: She is alert and oriented to person, place, and time.  Skin: Capillary refill takes less than 2 seconds.  Left breast lumpectomy site red.  No drainage.  Left breast swollen and red (new per patient)  Psychiatric: She has a normal mood and affect. Her behavior is normal. Judgment and thought content normal.  Nursing note and vitals reviewed.    ED Treatments / Results  Labs (all labs ordered are listed, but only abnormal results are displayed) Labs Reviewed  COMPREHENSIVE METABOLIC PANEL - Abnormal; Notable for the following:       Result Value   Glucose, Bld 130 (*)    All other components within normal limits  CBC WITH DIFFERENTIAL/PLATELET - Abnormal; Notable for the following:    WBC 22.6 (*)    Neutro Abs 19.2 (*)    Monocytes Absolute 1.4 (*)    All other components within normal limits  CULTURE, BLOOD (ROUTINE X 2)  CULTURE, BLOOD (ROUTINE X 2)  URINE CULTURE  TROPONIN I  URINALYSIS, ROUTINE W REFLEX MICROSCOPIC (NOT AT [facility])  I-STAT CG4 LACTIC ACID, ED    EKG  EKG Interpretation None       Radiology Dg Chest 2 View  Result Date: 02/20/2016 CLINICAL DATA:  Fever. EXAM: CHEST  2 VIEW COMPARISON:  None. FINDINGS: Cardiomegaly noted. Right lower lobe airspace disease is compatible with pneumonia. There is no evidence of pulmonary edema, suspicious pulmonary nodule/mass, pleural effusion, or pneumothorax. No acute bony abnormalities are identified. IMPRESSION: Right lower lobe airspace disease likely representing pneumonia. Radiographic follow-up to resolution is recommended. Cardiomegaly. Electronically Signed   By: Margarette Canada M.D.   On: 02/20/2016 18:03    Procedures Procedures (including critical care time)  Medications Ordered in  ED Medications  metroNIDAZOLE (FLAGYL) IVPB 500 mg (not administered)  vancomycin (VANCOCIN) IVPB 1000 mg/200 mL premix (1,000 mg Intravenous New Bag/Given 02/20/16 1835)  acetaminophen (TYLENOL) tablet 1,000 mg (1,000 mg Oral Given 02/20/16 1725)  aztreonam (AZACTAM) 2 g in dextrose 5 % 50 mL IVPB (0 g Intravenous Stopped 02/20/16 1835)  morphine 4 MG/ML injection 4 mg (4 mg Intravenous Given 02/20/16 1732)  ondansetron (ZOFRAN) injection 4 mg (4 mg Intravenous Given 02/20/16 1732)     Initial Impression / Assessment and Plan / ED Course  I have reviewed the triage vital signs and the nursing notes.  Pertinent labs & imaging results that were available during my care of the patient were reviewed by me and considered in my medical decision making (see chart for details).  Clinical Course   Pt is feeling better after fluids and pain medications.  She was d/w Dr. Marlou Starks who will admit pt for observation and IV abx.    Final Clinical Impressions(s) / ED Diagnoses   Final diagnoses:  Fever, unspecified fever cause  Cellulitis of breast  S/P lumpectomy, left breast    New Prescriptions New Prescriptions   No medications on file     Isla Pence, MD 02/20/16 1847

## 2016-02-21 ENCOUNTER — Inpatient Hospital Stay (HOSPITAL_COMMUNITY): Payer: Medicaid Other | Admitting: Anesthesiology

## 2016-02-21 ENCOUNTER — Encounter (HOSPITAL_COMMUNITY): Payer: Self-pay | Admitting: Anesthesiology

## 2016-02-21 ENCOUNTER — Encounter (HOSPITAL_COMMUNITY)
Admission: EM | Disposition: A | Discharge status: Home / Self Care | Payer: Self-pay | Source: Home / Self Care | Attending: Surgery

## 2016-02-21 DIAGNOSIS — A4101 Sepsis due to Methicillin susceptible Staphylococcus aureus: Secondary | ICD-10-CM | POA: Diagnosis present

## 2016-02-21 DIAGNOSIS — N61 Mastitis without abscess: Secondary | ICD-10-CM | POA: Diagnosis present

## 2016-02-21 DIAGNOSIS — Z170 Estrogen receptor positive status [ER+]: Secondary | ICD-10-CM | POA: Diagnosis not present

## 2016-02-21 DIAGNOSIS — F419 Anxiety disorder, unspecified: Secondary | ICD-10-CM | POA: Diagnosis present

## 2016-02-21 DIAGNOSIS — K52832 Lymphocytic colitis: Secondary | ICD-10-CM | POA: Diagnosis present

## 2016-02-21 DIAGNOSIS — R6521 Severe sepsis with septic shock: Secondary | ICD-10-CM | POA: Diagnosis not present

## 2016-02-21 DIAGNOSIS — Z91048 Other nonmedicinal substance allergy status: Secondary | ICD-10-CM | POA: Diagnosis not present

## 2016-02-21 DIAGNOSIS — Z885 Allergy status to narcotic agent status: Secondary | ICD-10-CM | POA: Diagnosis not present

## 2016-02-21 DIAGNOSIS — A419 Sepsis, unspecified organism: Secondary | ICD-10-CM | POA: Diagnosis not present

## 2016-02-21 DIAGNOSIS — E7800 Pure hypercholesterolemia, unspecified: Secondary | ICD-10-CM | POA: Diagnosis present

## 2016-02-21 DIAGNOSIS — Z981 Arthrodesis status: Secondary | ICD-10-CM | POA: Diagnosis not present

## 2016-02-21 DIAGNOSIS — D649 Anemia, unspecified: Secondary | ICD-10-CM | POA: Diagnosis present

## 2016-02-21 DIAGNOSIS — Z9851 Tubal ligation status: Secondary | ICD-10-CM | POA: Diagnosis not present

## 2016-02-21 DIAGNOSIS — C50412 Malignant neoplasm of upper-outer quadrant of left female breast: Secondary | ICD-10-CM | POA: Diagnosis present

## 2016-02-21 DIAGNOSIS — M5430 Sciatica, unspecified side: Secondary | ICD-10-CM | POA: Diagnosis present

## 2016-02-21 DIAGNOSIS — Z91040 Latex allergy status: Secondary | ICD-10-CM | POA: Diagnosis not present

## 2016-02-21 DIAGNOSIS — T814XXA Infection following a procedure, initial encounter: Secondary | ICD-10-CM | POA: Diagnosis present

## 2016-02-21 DIAGNOSIS — Z888 Allergy status to other drugs, medicaments and biological substances status: Secondary | ICD-10-CM | POA: Diagnosis not present

## 2016-02-21 DIAGNOSIS — N611 Abscess of the breast and nipple: Secondary | ICD-10-CM | POA: Diagnosis present

## 2016-02-21 DIAGNOSIS — Z79899 Other long term (current) drug therapy: Secondary | ICD-10-CM | POA: Diagnosis not present

## 2016-02-21 DIAGNOSIS — N179 Acute kidney failure, unspecified: Secondary | ICD-10-CM | POA: Diagnosis not present

## 2016-02-21 DIAGNOSIS — Z880 Allergy status to penicillin: Secondary | ICD-10-CM | POA: Diagnosis not present

## 2016-02-21 DIAGNOSIS — I10 Essential (primary) hypertension: Secondary | ICD-10-CM | POA: Diagnosis present

## 2016-02-21 DIAGNOSIS — F17210 Nicotine dependence, cigarettes, uncomplicated: Secondary | ICD-10-CM | POA: Diagnosis present

## 2016-02-21 DIAGNOSIS — Z803 Family history of malignant neoplasm of breast: Secondary | ICD-10-CM | POA: Diagnosis not present

## 2016-02-21 HISTORY — PX: INCISION AND DRAINAGE ABSCESS: SHX5864

## 2016-02-21 LAB — BASIC METABOLIC PANEL
ANION GAP: 7 (ref 5–15)
BUN: 21 mg/dL — AB (ref 6–20)
CHLORIDE: 103 mmol/L (ref 101–111)
CO2: 24 mmol/L (ref 22–32)
Calcium: 7.9 mg/dL — ABNORMAL LOW (ref 8.9–10.3)
Creatinine, Ser: 1.36 mg/dL — ABNORMAL HIGH (ref 0.44–1.00)
GFR calc Af Amer: 50 mL/min — ABNORMAL LOW (ref >60)
GFR, EST NON AFRICAN AMERICAN: 43 mL/min — AB (ref >60)
Glucose, Bld: 174 mg/dL — ABNORMAL HIGH (ref 65–99)
POTASSIUM: 4.7 mmol/L (ref 3.5–5.1)
SODIUM: 134 mmol/L — AB (ref 135–145)

## 2016-02-21 LAB — CBC
HEMATOCRIT: 35.7 % — AB (ref 36.0–46.0)
HEMOGLOBIN: 12 g/dL (ref 12.0–15.0)
MCH: 30.7 pg (ref 26.0–34.0)
MCHC: 33.6 g/dL (ref 30.0–36.0)
MCV: 91.3 fL (ref 78.0–100.0)
Platelets: 216 10*3/uL (ref 150–400)
RBC: 3.91 MIL/uL (ref 3.87–5.11)
RDW: 13.9 % (ref 11.5–15.5)
WBC: 25.4 10*3/uL — AB (ref 4.0–10.5)

## 2016-02-21 SURGERY — INCISION AND DRAINAGE, ABSCESS
Anesthesia: General | Site: Breast | Laterality: Left

## 2016-02-21 MED ORDER — ACETAMINOPHEN 10 MG/ML IV SOLN
INTRAVENOUS | Status: AC
  Filled 2016-02-21: qty 100

## 2016-02-21 MED ORDER — LIDOCAINE 2% (20 MG/ML) 5 ML SYRINGE
INTRAMUSCULAR | Status: DC | PRN
  Administered 2016-02-21: 100 mg via INTRAVENOUS

## 2016-02-21 MED ORDER — SODIUM CHLORIDE 0.9 % IV BOLUS (SEPSIS)
500.0000 mL | Freq: Once | INTRAVENOUS | Status: AC
  Administered 2016-02-21: 500 mL via INTRAVENOUS

## 2016-02-21 MED ORDER — ONDANSETRON HCL 4 MG/2ML IJ SOLN
INTRAMUSCULAR | Status: DC | PRN
  Administered 2016-02-21: 4 mg via INTRAVENOUS

## 2016-02-21 MED ORDER — BUPIVACAINE HCL (PF) 0.25 % IJ SOLN
INTRAMUSCULAR | Status: AC
  Filled 2016-02-21: qty 30

## 2016-02-21 MED ORDER — SODIUM CHLORIDE 0.9 % IV SOLN
INTRAVENOUS | Status: DC | PRN
  Administered 2016-02-21: 23:00:00 via INTRAVENOUS

## 2016-02-21 MED ORDER — ACETAMINOPHEN 10 MG/ML IV SOLN
INTRAVENOUS | Status: DC | PRN
  Administered 2016-02-21: 1000 mg via INTRAVENOUS

## 2016-02-21 MED ORDER — FENTANYL CITRATE (PF) 100 MCG/2ML IJ SOLN
INTRAMUSCULAR | Status: DC | PRN
  Administered 2016-02-21: 100 ug via INTRAVENOUS

## 2016-02-21 MED ORDER — LIDOCAINE 2% (20 MG/ML) 5 ML SYRINGE
INTRAMUSCULAR | Status: AC
  Filled 2016-02-21: qty 5

## 2016-02-21 MED ORDER — PHENYLEPHRINE HCL 10 MG/ML IJ SOLN
INTRAMUSCULAR | Status: DC | PRN
  Administered 2016-02-21 (×3): 120 ug via INTRAVENOUS

## 2016-02-21 MED ORDER — PROPOFOL 10 MG/ML IV BOLUS
INTRAVENOUS | Status: AC
  Filled 2016-02-21: qty 60

## 2016-02-21 MED ORDER — VANCOMYCIN HCL IN DEXTROSE 750-5 MG/150ML-% IV SOLN
750.0000 mg | Freq: Two times a day (BID) | INTRAVENOUS | Status: DC
  Administered 2016-02-21 (×2): 750 mg via INTRAVENOUS
  Filled 2016-02-21 (×3): qty 150

## 2016-02-21 MED ORDER — PROPOFOL 500 MG/50ML IV EMUL
INTRAVENOUS | Status: DC | PRN
  Administered 2016-02-21: 25 ug/kg/min via INTRAVENOUS

## 2016-02-21 MED ORDER — SUCCINYLCHOLINE CHLORIDE 200 MG/10ML IV SOSY
PREFILLED_SYRINGE | INTRAVENOUS | Status: DC | PRN
  Administered 2016-02-21: 100 mg via INTRAVENOUS

## 2016-02-21 MED ORDER — FENTANYL CITRATE (PF) 100 MCG/2ML IJ SOLN
INTRAMUSCULAR | Status: AC
  Filled 2016-02-21: qty 2

## 2016-02-21 MED ORDER — PROPOFOL 10 MG/ML IV BOLUS
INTRAVENOUS | Status: AC
  Filled 2016-02-21: qty 20

## 2016-02-21 MED ORDER — PROPOFOL 10 MG/ML IV BOLUS
INTRAVENOUS | Status: DC | PRN
  Administered 2016-02-21: 200 mg via INTRAVENOUS

## 2016-02-21 MED ORDER — DEXAMETHASONE SODIUM PHOSPHATE 10 MG/ML IJ SOLN
INTRAMUSCULAR | Status: DC | PRN
  Administered 2016-02-21: 10 mg via INTRAVENOUS

## 2016-02-21 SURGICAL SUPPLY — 27 items
BNDG GAUZE ELAST 4 BULKY (GAUZE/BANDAGES/DRESSINGS) ×6 IMPLANT
COVER SURGICAL LIGHT HANDLE (MISCELLANEOUS) ×3 IMPLANT
DECANTER SPIKE VIAL GLASS SM (MISCELLANEOUS) ×3 IMPLANT
DRAPE LAPAROSCOPIC ABDOMINAL (DRAPES) ×3 IMPLANT
DRSG PAD ABDOMINAL 8X10 ST (GAUZE/BANDAGES/DRESSINGS) ×3 IMPLANT
ELECT REM PT RETURN 9FT ADLT (ELECTROSURGICAL) ×3
ELECTRODE REM PT RTRN 9FT ADLT (ELECTROSURGICAL) ×1 IMPLANT
GAUZE SPONGE 4X4 12PLY STRL (GAUZE/BANDAGES/DRESSINGS) ×3 IMPLANT
GAUZE SPONGE 4X4 16PLY XRAY LF (GAUZE/BANDAGES/DRESSINGS) ×6 IMPLANT
GLOVE BIO SURGEON STRL SZ7.5 (GLOVE) ×3 IMPLANT
GLOVE ECLIPSE 7.5 STRL STRAW (GLOVE) ×3 IMPLANT
GOWN STRL REUS W/TWL XL LVL3 (GOWN DISPOSABLE) ×6 IMPLANT
KIT BASIN OR (CUSTOM PROCEDURE TRAY) ×3 IMPLANT
NEEDLE HYPO 25X1 1.5 SAFETY (NEEDLE) ×3 IMPLANT
NS IRRIG 1000ML POUR BTL (IV SOLUTION) ×3 IMPLANT
PACK GENERAL/GYN (CUSTOM PROCEDURE TRAY) ×3 IMPLANT
PAD ABD 8X10 STRL (GAUZE/BANDAGES/DRESSINGS) ×3 IMPLANT
SPONGE LAP 18X18 X RAY DECT (DISPOSABLE) IMPLANT
SUT MNCRL AB 4-0 PS2 18 (SUTURE) IMPLANT
SUT VIC AB 3-0 SH 27 (SUTURE)
SUT VIC AB 3-0 SH 27XBRD (SUTURE) IMPLANT
SWAB COLLECTION DEVICE MRSA (MISCELLANEOUS) ×3 IMPLANT
SWAB CULTURE ESWAB REG 1ML (MISCELLANEOUS) ×3 IMPLANT
SYR CONTROL 10ML LL (SYRINGE) IMPLANT
TAPE PAPER 3X10 WHT MICROPORE (GAUZE/BANDAGES/DRESSINGS) ×3 IMPLANT
TOWEL OR 17X26 10 PK STRL BLUE (TOWEL DISPOSABLE) ×3 IMPLANT
TOWEL OR NON WOVEN STRL DISP B (DISPOSABLE) IMPLANT

## 2016-02-21 NOTE — Progress Notes (Signed)
Subjective: I was called by the patient's nurse due to fever, tachycardia and hypotension with concern for sepsis. Patient feels her breast is more painful and swollen throughout the day and into this evening.  Objective: Vital signs in last 24 hours: Temp:  [101.6 F (38.7 C)-101.7 F (38.7 C)] 101.7 F (38.7 C) (10/16 2136) Pulse Rate:  [88-107] 88 (10/16 2220) Resp:  [16-22] 18 (10/16 2220) BP: (73-119)/(47-106) 73/59 (10/16 2220) SpO2:  [94 %-99 %] 97 % (10/16 2220) Last BM Date: 02/20/16  Intake/Output from previous day: 10/15 0701 - 10/16 0700 In: -  Out: 550 [Urine:550] Intake/Output this shift: No intake/output data recorded.  General appearance: alert, cooperative and no distress Resp: clear to auscultation bilaterally and Without increased work of breathing Breasts:  Significant erythema and swelling of the entire left breast but worse in the lateral aspect around her incision with more swelling here and very tender Cardio: regular rate and rhythm, S1, S2 normal, no murmur, click, rub or gallop and Regular tachycardia  Lab Results:   Recent Labs  02/20/16 1640 02/21/16 0414  WBC 22.6* 25.4*  HGB 13.5 12.0  HCT 39.3 35.7*  PLT 254 216   BMET  Recent Labs  02/20/16 1630 02/21/16 0414  NA 136 134*  K 3.8 4.7  CL 102 103  CO2 23 24  GLUCOSE 130* 174*  BUN 17 21*  CREATININE 0.83 1.36*  CALCIUM 9.2 7.9*     Studies/Results: Dg Chest 2 View  Result Date: 02/20/2016 CLINICAL DATA:  Fever. EXAM: CHEST  2 VIEW COMPARISON:  None. FINDINGS: Cardiomegaly noted. Right lower lobe airspace disease is compatible with pneumonia. There is no evidence of pulmonary edema, suspicious pulmonary nodule/mass, pleural effusion, or pneumothorax. No acute bony abnormalities are identified. IMPRESSION: Right lower lobe airspace disease likely representing pneumonia. Radiographic follow-up to resolution is recommended. Cardiomegaly. Electronically Signed   By: Margarette Canada  M.D.   On: 02/20/2016 18:03    Anti-infectives: Anti-infectives    Start     Dose/Rate Route Frequency Ordered Stop   02/21/16 1200  vancomycin (VANCOCIN) IVPB 750 mg/150 ml premix     750 mg 150 mL/hr over 60 Minutes Intravenous Every 12 hours 02/21/16 0753     02/20/16 2359  vancomycin (VANCOCIN) IVPB 1000 mg/200 mL premix  Status:  Discontinued     1,000 mg 200 mL/hr over 60 Minutes Intravenous Every 12 hours 02/20/16 1954 02/21/16 0753   02/20/16 1645  aztreonam (AZACTAM) 2 g in dextrose 5 % 50 mL IVPB     2 g 100 mL/hr over 30 Minutes Intravenous  Once 02/20/16 1637 02/20/16 1835   02/20/16 1645  metroNIDAZOLE (FLAGYL) IVPB 500 mg  Status:  Discontinued     500 mg 100 mL/hr over 60 Minutes Intravenous  Once 02/20/16 1637 02/20/16 1923   02/20/16 1645  vancomycin (VANCOCIN) IVPB 1000 mg/200 mL premix     1,000 mg 200 mL/hr over 60 Minutes Intravenous  Once 02/20/16 1637 02/20/16 1935      Assessment/Plan: Marked breast erythema and localized increased erythema and swelling around her incision status post recent lumpectomy. Probably significant wound infection/abscess. This evening there are signs of early sepsis with fever, tachycardia and hypotension. Creatinine was also up this morning. She is receiving a fluid bolus currently. I've recommended incision and drainage of her wound in the operating room tonight. I discussed the indications and nature of the surgery and the fact the wound would be left open. Patient understands and agrees  to proceed.     LOS: 0 days    [name] T 10/16/2017Patient ID: [name], female   DOB: 1961-01-06, 55 y.o.   MRN: [id number]

## 2016-02-21 NOTE — Progress Notes (Signed)
Nursing Note: Pt taken to the OR and report was given,Talked w/ Dr.Judd and gave hx,allergies,events of this evening and pcr sent to the lab and CHG bath not done as pt refused.She says the last time she had it she broke out all over like she had poison ivy.Accompanied by Rapid response Nurse.Next dose of Vancomycin sent with pt.Cnsent signed and Dr.Hoxworth talked with pt at length.wbb

## 2016-02-21 NOTE — Progress Notes (Signed)
Nursing Note: Bp-81/47 P-97 PO2 97% on r/a.2210 BP 81/54 P100 R-22 PO2 99% on r/a.Rapid response Nurse is at bedside w/ pt monitoring.wbb

## 2016-02-21 NOTE — Progress Notes (Addendum)
Nursing Note: Pt arrived at 1930 per shift report.Pt up from ED and was admitted from home.Alert and oriented x4.Pt c/o pain in L breast where she had a  and biopsy on 02/04/2016.L breast very red,swollen and has red,dry incision w/ dermabond at outer breast..Rash noted to L breast,red,flat rash and area has a visible lump and is very tender.Pt says it is very swollen."twice the size it normally is".Oriented to room and call bell.Pt is ambulatory and was ambulating in the room when this nurse received report at the bedside.wbb

## 2016-02-21 NOTE — Progress Notes (Signed)
Spoke with Dr Hassell Done via phone re: fever 101.3.  Physician states will not treat fever at this time, will continue to  Monitor.  MD to round with pt later in the day.  Thomasene Lot, RN

## 2016-02-21 NOTE — Progress Notes (Signed)
Nursing Note: On-call returned page.No new orders obtained.[name],[name].wbb

## 2016-02-21 NOTE — Progress Notes (Signed)
Nursing Note: Temp-101.6.A; Paged on -call with result.wbb

## 2016-02-21 NOTE — Op Note (Signed)
Preoperative Diagnosis: Postoperative left breast abscess  Postoprative Diagnosis: Same  Procedure: Procedure(s): INCISION AND DRAINAGE LEFT BREAST ABSCESS   Surgeon: Excell Seltzer T   Assistants: None  Anesthesia:  General endotracheal anesthesia  Indications: Patient is approximately 2 weeks following left breast lumpectomy for cancer. She presents with erythema and swelling and pain of her left breast and was admitted on IV antibiotics. This evening she developed hypotension in the 70s and tachycardia with subjective increased swelling and pain of the left breast. Exam showed a markedly swollen and erythematous left breast with probable tense seroma or abscess at the incision site. I recommended proceeding with incision and drainage. The procedure and indications and risks were discussed with the patient preoperatively and she agreed.    Procedure Detail: Patient was brought to the operating room, placed on the operating table in the supine position and general endotracheal anesthesia induced. She was already on IV antibiotics. The left breast was widely sterilely prepped and draped. Patient timeout was performed and correct procedure verified. The previous incision was sharply opened and dissection carried down into a large cavity and 450 mL of thin purulent fluid was drained. Some shaggy fibrinous debris in the lumpectomy cavity was removed. The large cavity was thoroughly irrigated with saline. Soft tissue was infiltrated with Marcaine. The cavity was packed with moist saline Kerlix. Sponge needle is counts were correct. Dry sterile dressing was applied.   Findings: As above  Estimated Blood Loss:  Minimal         Drains: Wound packed with moist Kerlix  Blood Given: none          Specimens: Culture and sensitivity        Complications:  * No complications entered in OR log *         Disposition: PACU - hemodynamically stable.         Condition: stable

## 2016-02-21 NOTE — Anesthesia Preprocedure Evaluation (Addendum)
Anesthesia Evaluation  Patient identified by MRN, date of birth, ID band Patient awake    Reviewed: Allergy & Precautions, NPO status , Patient's Chart, lab work & pertinent test results  History of Anesthesia Complications (+) PONV and history of anesthetic complications  Airway Mallampati: III  TM Distance: >3 FB Neck ROM: Full    Dental no notable dental hx. (+) Dental Advisory Given, Chipped, Poor Dentition   Pulmonary Current Smoker,    Pulmonary exam normal breath sounds clear to auscultation       Cardiovascular hypertension, Pt. on medications Normal cardiovascular exam Rhythm:Regular Rate:Normal     Neuro/Psych  Headaches, PSYCHIATRIC DISORDERS Anxiety Depression TIA   GI/Hepatic negative GI ROS, Neg liver ROS,   Endo/Other  obesity  Renal/GU negative Renal ROS  negative genitourinary   Musculoskeletal  (+) Arthritis ,   Abdominal   Peds negative pediatric ROS (+)  Hematology negative hematology ROS (+)   Anesthesia Other Findings   Reproductive/Obstetrics negative OB ROS                            Anesthesia Physical Anesthesia Plan  ASA: II and emergent  Anesthesia Plan: General   Post-op Pain Management:    Induction: Intravenous  Airway Management Planned: Oral ETT  Additional Equipment:   Intra-op Plan:   Post-operative Plan: Extubation in OR  Informed Consent: I have reviewed the patients History and Physical, chart, labs and discussed the procedure including the risks, benefits and alternatives for the proposed anesthesia with the patient or authorized representative who has indicated his/her understanding and acceptance.   Dental advisory given  Plan Discussed with: CRNA  Anesthesia Plan Comments:         Anesthesia Quick Evaluation

## 2016-02-21 NOTE — Progress Notes (Signed)
Patient ID: [name], female   DOB: 1960/07/29, 55 y.o.   MRN: [id number]  [facility] Progress Note     Subjective: Continues to have significant pain, swelling, and erythema in left breast. Denies drainage from surgical incision.  Objective: Vital signs in last 24 hours: Temp:  [99.8 F (37.7 C)-102.2 F (39 C)] 101.6 F (38.7 C) (10/16 0557) Pulse Rate:  [88-118] 88 (10/16 0557) Resp:  [17-22] 17 (10/16 0557) BP: (111-164)/(75-106) 113/76 (10/16 0557) SpO2:  [94 %-100 %] 96 % (10/16 0557) Weight:  [212 lb 9.6 oz (96.4 kg)-213 lb (96.6 kg)] 212 lb 9.6 oz (96.4 kg) (10/15 1930) Last BM Date: 02/20/16  Intake/Output from previous day: 10/15 0701 - 10/16 0700 In: -  Out: 550 [Urine:550] Intake/Output this shift: Total I/O In: 1500 [P.O.:600; I.V.:900] Out: 400 [Urine:400]  PE: Gen:  Alert, NAD, pleasant Abd: Soft, NT/ND Left breast: diffuse erythema and significant swelling of the entire left breast, no palpable abscess, incision site closed with no drainage  Lab Results:   Recent Labs  02/20/16 1640 02/21/16 0414  WBC 22.6* 25.4*  HGB 13.5 12.0  HCT 39.3 35.7*  PLT 254 216   BMET  Recent Labs  02/20/16 1630 02/21/16 0414  NA 136 134*  K 3.8 4.7  CL 102 103  CO2 23 24  GLUCOSE 130* 174*  BUN 17 21*  CREATININE 0.83 1.36*  CALCIUM 9.2 7.9*   PT/INR No results for input(s): LABPROT, INR in the last 72 hours. CMP     Component Value Date/Time   NA 134 (L) 02/21/2016 0414   K 4.7 02/21/2016 0414   CL 103 02/21/2016 0414   CO2 24 02/21/2016 0414   GLUCOSE 174 (H) 02/21/2016 0414   BUN 21 (H) 02/21/2016 0414   CREATININE 1.36 (H) 02/21/2016 0414   CALCIUM 7.9 (L) 02/21/2016 0414   PROT 7.1 02/20/2016 1630   ALBUMIN 4.2 02/20/2016 1630   AST 23 02/20/2016 1630   ALT 25 02/20/2016 1630   ALKPHOS 90 02/20/2016 1630   BILITOT 0.8 02/20/2016 1630   GFRNONAA 43 (L) 02/21/2016 0414   GFRAA 50 (L) 02/21/2016 0414   Lipase  No  results found for: LIPASE     Studies/Results: Dg Chest 2 View  Result Date: 02/20/2016 CLINICAL DATA:  Fever. EXAM: CHEST  2 VIEW COMPARISON:  None. FINDINGS: Cardiomegaly noted. Right lower lobe airspace disease is compatible with pneumonia. There is no evidence of pulmonary edema, suspicious pulmonary nodule/mass, pleural effusion, or pneumothorax. No acute bony abnormalities are identified. IMPRESSION: Right lower lobe airspace disease likely representing pneumonia. Radiographic follow-up to resolution is recommended. Cardiomegaly. Electronically Signed   By: Margarette Canada M.D.   On: 02/20/2016 18:03    Anti-infectives: Anti-infectives    Start     Dose/Rate Route Frequency Ordered Stop   02/21/16 1200  vancomycin (VANCOCIN) IVPB 750 mg/150 ml premix     750 mg 150 mL/hr over 60 Minutes Intravenous Every 12 hours 02/21/16 0753     02/20/16 2359  vancomycin (VANCOCIN) IVPB 1000 mg/200 mL premix  Status:  Discontinued     1,000 mg 200 mL/hr over 60 Minutes Intravenous Every 12 hours 02/20/16 1954 02/21/16 0753   02/20/16 1645  aztreonam (AZACTAM) 2 g in dextrose 5 % 50 mL IVPB     2 g 100 mL/hr over 30 Minutes Intravenous  Once 02/20/16 1637 02/20/16 1835   02/20/16 1645  metroNIDAZOLE (FLAGYL) IVPB 500 mg  Status:  Discontinued  500 mg 100 mL/hr over 60 Minutes Intravenous  Once 02/20/16 1637 02/20/16 1923   02/20/16 1645  vancomycin (VANCOCIN) IVPB 1000 mg/200 mL premix     1,000 mg 200 mL/hr over 60 Minutes Intravenous  Once 02/20/16 1637 02/20/16 1935       Assessment/Plan Cellulitis left breast - s/p left breast lumpectomy and sentinel node mapping 02/04/16 Dr. Lucia Gaskins - prior to admission patient had 2-3 days of increased left breast pain and erythema, fever, and chills - contact dermatitis vs postop infection. no palpable abscess. - no improvement after 24hr IV antibiotics, patient continues to have intermittent fevers TMAX 101.6 today. WBC 25.4 - urine culture and  blood cultures pending - CXR also showed right lower lobe airspace disease likely representing pneumonia >> consider follow-up XR  Anxiety HTN  HLD  Sciatica - will consult PT  ID - azactam 10/15>>10/15, vanc 10/15>> VTE - heparin FEN - regular  Plan - Discussed with Dr. Lucia Gaskins, may need to consider opening surgical incision site for possible seroma if no improvement after 48hr. Continue IV antibiotics, ice PRN. Labs in AM.   LOS: 0 days    [name] , [facility] 02/21/2016, 3:43 PM Pager: [phone] Consults: [phone] Mon-Fri 7:00 am-4:30 pm Sat-Sun 7:00 am-11:30 am

## 2016-02-21 NOTE — Progress Notes (Addendum)
Nursing Note: T-101.7 P-107 BP-73/49 PO2 99% on r/a.Pt awake and alert.oriented just feels tired and her L breast still hurts.A; Paged on-call and rapid response.Pt's L breast more,red, more swollen and she states it feels like if someone touched it it would burst.wbb

## 2016-02-21 NOTE — Progress Notes (Signed)
Nursing Note:Talked with Marland Kitchen Hoxworth,MD and instructed to give Normal Saline Bolus 500 ml now and that he will be in to see pt.Rapid response Nurse at the bedside.2202 Bolus initiated.Pt already on IV Vancomycin.wbb

## 2016-02-21 NOTE — Progress Notes (Signed)
Nursing Note: MD on unit and plan for I and D of L breast.Bolus infusing and Rapid response Nurse remains at the bedside.wbb

## 2016-02-22 ENCOUNTER — Encounter (HOSPITAL_COMMUNITY): Payer: Self-pay | Admitting: General Surgery

## 2016-02-22 DIAGNOSIS — C50412 Malignant neoplasm of upper-outer quadrant of left female breast: Secondary | ICD-10-CM

## 2016-02-22 DIAGNOSIS — Z170 Estrogen receptor positive status [ER+]: Secondary | ICD-10-CM

## 2016-02-22 DIAGNOSIS — A419 Sepsis, unspecified organism: Secondary | ICD-10-CM

## 2016-02-22 DIAGNOSIS — R6521 Severe sepsis with septic shock: Secondary | ICD-10-CM

## 2016-02-22 LAB — CBC
HCT: 30.5 % — ABNORMAL LOW (ref 36.0–46.0)
Hemoglobin: 10 g/dL — ABNORMAL LOW (ref 12.0–15.0)
MCH: 31.4 pg (ref 26.0–34.0)
MCHC: 32.8 g/dL (ref 30.0–36.0)
MCV: 95.9 fL (ref 78.0–100.0)
PLATELETS: 188 10*3/uL (ref 150–400)
RBC: 3.18 MIL/uL — AB (ref 3.87–5.11)
RDW: 14.3 % (ref 11.5–15.5)
WBC: 23 10*3/uL — AB (ref 4.0–10.5)

## 2016-02-22 LAB — BASIC METABOLIC PANEL
ANION GAP: 5 (ref 5–15)
BUN: 18 mg/dL (ref 6–20)
CO2: 21 mmol/L — AB (ref 22–32)
Calcium: 7.4 mg/dL — ABNORMAL LOW (ref 8.9–10.3)
Chloride: 110 mmol/L (ref 101–111)
Creatinine, Ser: 1.13 mg/dL — ABNORMAL HIGH (ref 0.44–1.00)
GFR calc non Af Amer: 54 mL/min — ABNORMAL LOW (ref >60)
GLUCOSE: 190 mg/dL — AB (ref 65–99)
POTASSIUM: 4.1 mmol/L (ref 3.5–5.1)
Sodium: 136 mmol/L (ref 135–145)

## 2016-02-22 LAB — LACTIC ACID, PLASMA: Lactic Acid, Venous: 0.7 mmol/L (ref 0.5–1.9)

## 2016-02-22 LAB — URINE CULTURE: CULTURE: NO GROWTH

## 2016-02-22 LAB — SURGICAL PCR SCREEN
MRSA, PCR: NEGATIVE
STAPHYLOCOCCUS AUREUS: POSITIVE — AB

## 2016-02-22 MED ORDER — VANCOMYCIN HCL 10 G IV SOLR
1250.0000 mg | Freq: Two times a day (BID) | INTRAVENOUS | Status: DC
  Administered 2016-02-22 – 2016-02-24 (×4): 1250 mg via INTRAVENOUS
  Filled 2016-02-22 (×6): qty 1250

## 2016-02-22 MED ORDER — PHENYLEPHRINE HCL 10 MG/ML IJ SOLN
30.0000 ug/min | INTRAMUSCULAR | Status: DC
  Administered 2016-02-22: 30 ug/min via INTRAVENOUS
  Administered 2016-02-22: 60 ug/min via INTRAVENOUS
  Filled 2016-02-22 (×4): qty 1

## 2016-02-22 MED ORDER — PROMETHAZINE HCL 25 MG/ML IJ SOLN: 6.2500 mg | INTRAMUSCULAR | Status: DC | PRN

## 2016-02-22 MED ORDER — PIPERACILLIN-TAZOBACTAM 3.375 G IVPB
3.3750 g | Freq: Three times a day (TID) | INTRAVENOUS | Status: DC
  Administered 2016-02-22 – 2016-02-24 (×6): 3.375 g via INTRAVENOUS
  Filled 2016-02-22 (×7): qty 50

## 2016-02-22 MED ORDER — SODIUM CHLORIDE 0.9 % IV BOLUS (SEPSIS)
2500.0000 mL | Freq: Once | INTRAVENOUS | Status: AC
  Administered 2016-02-22: 1000 mL via INTRAVENOUS

## 2016-02-22 MED ORDER — KCL IN DEXTROSE-NACL 20-5-0.9 MEQ/L-%-% IV SOLN
INTRAVENOUS | Status: DC
  Administered 2016-02-22 (×2): via INTRAVENOUS
  Filled 2016-02-22 (×3): qty 1000

## 2016-02-22 MED ORDER — FENTANYL CITRATE (PF) 100 MCG/2ML IJ SOLN: 25.0000 ug | INTRAMUSCULAR | Status: DC | PRN

## 2016-02-22 MED ORDER — ORAL CARE MOUTH RINSE
15.0000 mL | Freq: Two times a day (BID) | OROMUCOSAL | Status: DC
  Administered 2016-02-23: 15 mL via OROMUCOSAL

## 2016-02-22 MED ORDER — BUPIVACAINE HCL (PF) 0.25 % IJ SOLN
INTRAMUSCULAR | Status: DC | PRN
  Administered 2016-02-22: 17 mL

## 2016-02-22 MED ORDER — ONDANSETRON HCL 4 MG/2ML IJ SOLN
4.0000 mg | Freq: Once | INTRAMUSCULAR | Status: DC | PRN
Start: 2016-02-22 — End: 2016-02-22

## 2016-02-22 NOTE — Progress Notes (Signed)
Pharmacy Antibiotic Note  [name] is a 55 y.o. female re-admitted on 02/20/2016 with postop left breast cellulitis and abscess, possible early sepsis.  S/p I&D on 10/16.  Pharmacy has been consulted for Vancomycin and Zosyn dosing.  Tm 101.7, currently afebrile.   WBC elevated but improving. SCr increased but now improving (0.83 > 1.36 > 1.13) and CrCl ~ 69 CG, ~ 64N  Plan:  Zosyn 3.375g IV Q8H infused over 4hrs.  Increase to Vancomycin 1250 mg IV q12h.  Measure Vanc trough at steady state.  Follow up renal fxn, culture results, and clinical course.   Height: 5\' 8"  (172.7 cm) Weight: 212 lb 9.6 oz (96.4 kg) IBW/kg (Calculated) : 63.9  Temp (24hrs), Avg:98.9 F (37.2 C), Min:97.5 F (36.4 C), Max:101.7 F (38.7 C)   Recent Labs Lab 02/20/16 1630 02/20/16 1640 02/20/16 1648 02/21/16 0414 02/22/16 0034 02/22/16 0309  WBC  --  22.6*  --  25.4*  --  23.0*  CREATININE 0.83  --   --  1.36*  --   --   LATICACIDVEN  --   --  1.75  --  0.7  --     Estimated Creatinine Clearance: 57.4 mL/min (by C-G formula based on SCr of 1.36 mg/dL (H)).    Allergies  Allergen Reactions  . Soap Shortness Of Breath and Rash    IVORY SOAP  . Adhesive [Tape] Other (See Comments)    BLISTERS  . Augmentin [Amoxicillin-Pot Clavulanate] Nausea And Vomiting    Has patient had a PCN reaction causing immediate rash, facial/tongue/throat swelling, SOB or lightheadedness with hypotension: Unknown Has patient had a PCN reaction causing severe rash involving mucus membranes or skin necrosis: Unknown Has patient had a PCN reaction that required hospitalization Unknown Has patient had a PCN reaction occurring within the last 10 years: Unknown If all of the above answers are "NO", then may proceed with Cephalosporin use.   Marland Kitchen Cymbalta [Duloxetine Hcl] Nausea Only  . Latex Other (See Comments)    BLISTERS  . Nsaids Other (See Comments)    LYMPHOCYTIC COLITIS  . Oxycodone Nausea Only    Not  on an empty stomach  . Chlorhexidine Gluconate Rash    Pt says her skin looked like poison ivy  . Mupirocin Rash    Antimicrobials this admission: 10/15 Aztreonam x1 only  10/15 Vancomycin  >>  10/17 Zosyn >>  Dose adjustments this admission: 10/16 Scr increased, decreased vanc dose to 750mg  q12h 10/17 SCr decreased, increase vanc dose to 1250 q12h.  Goal VT 15-20.  Microbiology results: 10/15 BCx: pending 10/15 UCx: NGF 10/16 surgical pcr screen: positive staph aureus; neg MRSA 10/16 Breast Cx: sent  Thank you for allowing pharmacy to be a part of this patient's care.  Gretta Arab PharmD, BCPS Pager [phone] 02/22/2016 11:37 AM

## 2016-02-22 NOTE — Anesthesia Procedure Notes (Signed)
Procedure Name: Intubation Date/Time: 02/21/2016 11:33 PM Performed by: Danley Danker L Patient Re-evaluated:Patient Re-evaluated prior to inductionOxygen Delivery Method: Circle system utilized Preoxygenation: Pre-oxygenation with 100% oxygen Intubation Type: IV induction, Rapid sequence and Cricoid Pressure applied Laryngoscope Size: Miller and 2 Grade View: Grade I Tube type: Oral Tube size: 7.0 mm Number of attempts: 1 Airway Equipment and Method: Stylet Placement Confirmation: ETT inserted through vocal cords under direct vision,  positive ETCO2 and breath sounds checked- equal and bilateral Secured at: 21 cm Tube secured with: Tape Dental Injury: Teeth and Oropharynx as per pre-operative assessment

## 2016-02-22 NOTE — Consult Note (Signed)
PULMONARY / CRITICAL CARE MEDICINE   Name: [name] MRN: [id number] DOB: 1961-03-16    ADMISSION DATE:  02/20/2016 CONSULTATION DATE:  02/22/2016  REFERRING MD:  Dr. Lucia Gaskins  CHIEF COMPLAINT:  Hypotension  HISTORY OF PRESENT ILLNESS:  55 year old female with past medical history as below, which is significant for breast cancer of the left breast status post lumpectomy approximately 2 weeks prior to admission. She presented to [facility] emergency department 10/15 with complaints of rash and redness 2-3 days at her recent surgical site. The rash was noted to be centered around her incision as well as several smaller dot-like areas on the ipsilateral arm. She was admitted to the hospital with a diagnosis of cellulitis and started on broad-spectrum antibiotics. 10/16, however, she developed worsening fevers, tachycardia, and hypotension. Affected area appeared to be more edematous and there was concern for abscess. She was taken to the operating room under Dr. Excell Seltzer and had abscess drained of 450 mL thin purulent fluid. She was recovered in the ICU where she continued to be hypotensive despite IVF resuscitation. PCCM consulted.  PAST MEDICAL HISTORY :  She  has a past medical history of Anxiety; Arthritis; Breast cancer of upper-outer quadrant of left female breast ([place]) (01/03/2016); Complication of anesthesia; Cough due to ACE inhibitor (01/28/2016); Depression; Eczema; High cholesterol; Hypertension; Lymphocytic colitis; Migraines; PONV (postoperative nausea and vomiting); Sciatica; Sinus drainage (01/28/2016); and Urinary urgency.  PAST SURGICAL HISTORY: She  has a past surgical history that includes Cesarean section (1989, 1997); Colonoscopy; Tubal ligation; Tonsillectomy; Wisdom tooth extraction; Endometrial ablation (09/29/2005); Incontinence surgery (09/29/2005); Cysto (09/29/2005); Lumbar fusion (04/28/2013); and Breast lumpectomy with needle localization and axillary  sentinel lymph node bx (Left, 02/04/2016).  Allergies  Allergen Reactions  . Soap Shortness Of Breath and Rash    IVORY SOAP  . Adhesive [Tape] Other (See Comments)    BLISTERS  . Augmentin [Amoxicillin-Pot Clavulanate] Nausea And Vomiting    Has patient had a PCN reaction causing immediate rash, facial/tongue/throat swelling, SOB or lightheadedness with hypotension: Unknown Has patient had a PCN reaction causing severe rash involving mucus membranes or skin necrosis: Unknown Has patient had a PCN reaction that required hospitalization Unknown Has patient had a PCN reaction occurring within the last 10 years: Unknown If all of the above answers are "NO", then may proceed with Cephalosporin use.   Marland Kitchen Cymbalta [Duloxetine Hcl] Nausea Only  . Latex Other (See Comments)    BLISTERS  . Nsaids Other (See Comments)    LYMPHOCYTIC COLITIS  . Oxycodone Nausea Only    Not on an empty stomach  . Chlorhexidine Gluconate Rash    Pt says her skin looked like poison ivy  . Mupirocin Rash    No current facility-administered medications on file prior to encounter.    Current Outpatient Prescriptions on File Prior to Encounter  Medication Sig  . amitriptyline (ELAVIL) 25 MG tablet Take 25 mg by mouth at bedtime.  Marland Kitchen amLODipine (NORVASC) 5 MG tablet Take 5 mg by mouth daily.  Marland Kitchen atorvastatin (LIPITOR) 20 MG tablet Take 20 mg by mouth daily.  . cholecalciferol (VITAMIN D) 1000 units tablet Take 1,000 Units by mouth daily.  Marland Kitchen HYDROcodone-acetaminophen (NORCO/VICODIN) 5-325 MG tablet Take 1-2 tablets by mouth every 6 (six) hours as needed.  Marland Kitchen lisinopril-hydrochlorothiazide (PRINZIDE,ZESTORETIC) 20-12.5 MG tablet Take 1 tablet by mouth daily.  . meloxicam (MOBIC) 7.5 MG tablet Take 7.5 mg by mouth daily.  . Multiple Vitamins-Minerals (MULTIVITAMIN ADULT PO) Take 1 tablet by  mouth daily.   . Probiotic Product (PROBIOTIC DAILY PO) Take 1 capsule by mouth daily.   . TURMERIC PO Take 1 tablet by mouth  daily.     FAMILY HISTORY:  Her @FAMSTP (<SUBSCRIPT> error)@  SOCIAL HISTORY: She  reports that she has been smoking Cigarettes.  She has a 15.00 pack-year smoking history. She has never used smokeless tobacco. She reports that she does not drink alcohol or use drugs.  REVIEW OF SYSTEMS:   Bolds are positive  Constitutional: weight loss, gain, night sweats, Fevers, chills, fatigue .  HEENT: headaches, Sore throat, sneezing, nasal congestion, post nasal drip, Difficulty swallowing, Tooth/dental problems, visual complaints visual changes, ear ache CV:  chest pain, radiates: ,Orthopnea, PND, swelling in lower extremities, dizziness, palpitations, syncope.  GI  heartburn, indigestion, abdominal pain, nausea, vomiting, diarrhea, change in bowel habits, loss of appetite, bloody stools.  Resp: cough, productive: , hemoptysis, dyspnea, chest pain, pleuritic.  Skin: rash or itching or icterus GU: dysuria, change in color of urine, urgency or frequency. flank pain, hematuria  MS: joint pain or swelling. decreased range of motion  Psych: change in mood or affect. depression or anxiety.  Neuro: difficulty with speech, weakness, numbness, ataxia   SUBJECTIVE:  No complaints, just tired  VITAL SIGNS: BP 140/69   Pulse 80   Temp 97.5 F (36.4 C) (Oral)   Resp 17   Ht 5\' 8"  (1.727 m)   Wt 96.4 kg (212 lb 9.6 oz)   SpO2 (!) 89%   BMI 32.33 kg/m   HEMODYNAMICS:    VENTILATOR SETTINGS:    INTAKE / OUTPUT: I/O last 3 completed shifts: In: 3668.6 [P.O.:950; I.V.:2468.6; IV Piggyback:250] Out: [id number] [Urine:3625; Blood:25]  PHYSICAL EXAMINATION: General:  Obese female in NAD. Sleeping in bed.  Neuro:  Sleeping but easily arouses. Alert and oriented x 3. Mentation fully intact. HEENT:  [place]/AT, PERRL, no JVD Cardiovascular:  Tachy, regular, no MRG Lungs:  Clear posterior lung fields.  Abdomen:  Soft, non-tender, non-distended Musculoskeletal: No acute deformity or ROM limitation Skin:   Surgical site with dressing in place. Clean/Dry/Intact.  LABS:  BMET  Recent Labs Lab 02/20/16 1630 02/21/16 0414  NA 136 134*  K 3.8 4.7  CL 102 103  CO2 23 24  BUN 17 21*  CREATININE 0.83 1.36*  GLUCOSE 130* 174*    Electrolytes  Recent Labs Lab 02/20/16 1630 02/21/16 0414  CALCIUM 9.2 7.9*    CBC  Recent Labs Lab 02/20/16 1640 02/21/16 0414 02/22/16 0309  WBC 22.6* 25.4* 23.0*  HGB 13.5 12.0 10.0*  HCT 39.3 35.7* 30.5*  PLT 254 216 188    Coag's No results for input(s): APTT, INR in the last 168 hours.  Sepsis Markers  Recent Labs Lab 02/20/16 1648 02/22/16 0034  LATICACIDVEN 1.75 0.7    ABG No results for input(s): PHART, PCO2ART, PO2ART in the last 168 hours.  Liver Enzymes  Recent Labs Lab 02/20/16 1630  AST 23  ALT 25  ALKPHOS 90  BILITOT 0.8  ALBUMIN 4.2    Cardiac Enzymes  Recent Labs Lab 02/20/16 1640  TROPONINI <0.03    Glucose No results for input(s): GLUCAP in the last 168 hours.  Imaging No results found.   STUDIES:    CULTURES: Blood 10/15 > Urine 10/15 > Wound 10/16 >  ANTIBIOTICS: Aztreonam 10/15 > Vancomycin 10/15 >  SIGNIFICANT EVENTS: 9/29 lumpectomy 10/15 admit with surgical site cellulitis 10/16 to OR for abscess repair 10/17 post-op hypotension  LINES/TUBES:  DISCUSSION: 55 year old female status post recent lumpectomy for left breast cancer. Admitted 10/15 with apparent cellulitis of the surgical site. Worsened 10/16 with concern for abscess, taken to OR and drained. Now postoperatively she remains hypotensive with concern for septic origin. We'll continue broad-spectrum antibiotics, gentle IVF hydration, and will add phenylephrine for BP support. Although cuff blood pressures typically running in the 70s her mental status is fully intact, lactic acid normal, and she is making urine. I do question how accurate these measurements are. Should pressor support requirements increase will  consider arterial line placement for accurate BP readings and central line placement for more aggressive vasoactive therapies.  ASSESSMENT / PLAN:  PULMONARY A: No acute issues  P:    CARDIOVASCULAR A:  Hypotension, probable septic shock sepsis vs post-operative volume depletion. Suspect the former due to relatively short simple surgical procedure.  H/o HTN, HLD  P:  Telemetry MAP goal > 20mmHg Continue IVF resuscitation Phenylephrine titrated to MAP goal.  Lactic WNL Consider arterial line placement for more accurate BP monitoring. Patient wishes to defer art line/CVL placement at this time.  Hold home amlodipine and lisinopril/HCTZ  RENAL A:   AKI  P:   Repeat BMP Correct electrolytes as indicated. DC mobic  GASTROINTESTINAL A:   Lymphocytic colitis  P:   Monitor  HEMATOLOGIC A:   Anemia (baseline Hgb 12)  P:  Monitor H&H Transfuse per ICU guidelines.   INFECTIOUS A:   Severe Sepsis secondary to L breast abscess, cellulitis.  P:   Source control in OR 10/16 Follow cultures ABX as above  ENDOCRINE A:   No acute issues  P:     NEUROLOGIC A:   No acute issues  P:   Monitor  FAMILY  - Updates: Patient updated bedside  - Inter-disciplinary family meet or Palliative Care meeting due by:  10/24   Georgann Housekeeper, AGACNP-BC [place] [facility] Pager [phone] or [phone]   Attending Note:  I have examined patient, reviewed labs, studies and notes. I have discussed the case with Jaclynn Guarneri, and I agree with the data and plans as amended above. 55 yo woman, hx breast CA and lumpectomy 2 weeks ago. Admitted with apparent cellulitis and associated signs sepsis, hypotension, renal insufficiency. She went to OR for surgical debridement and breast abscess drainage 10/16 pm. She returned to PACU and then ICU with shock, presumed septic shock, on phenylephrine. CVC and art line deferred. On my eval today she is awake and  appropriate, strong UE and LE. Clear lung exam. She is on phenylephrine 60 with adequate BP to allow weaning. Will continue her aztreonam + vanco pending cx data. Wean phenylephrine for MAP > 65. Recheck BMP now and in am to follow her renal fxn. Consider further IVF boluses if unable to wean phenylephrine completely. Independent critical care time is 45 minutes.   Baltazar Apo, MD, PhD 02/22/2016, 9:30 AM [facility] [phone] or if no answer [phone]

## 2016-02-22 NOTE — Progress Notes (Signed)
Patient ID: [name], female   DOB: 03-21-1961, 55 y.o.   MRN: [id number] [facility] Progress Note:   1 Day Post-Op  Subjective: Mental status is clear Objective: Vital signs in last 24 hours: Temp:  [97.5 F (36.4 C)-101.7 F (38.7 C)] 97.5 F (36.4 C) (10/17 0800) Pulse Rate:  [73-110] 76 (10/17 1000) Resp:  [14-22] 18 (10/17 1000) BP: (73-140)/(36-77) 137/73 (10/17 1000) SpO2:  [87 %-100 %] 96 % (10/17 1000)  Intake/Output from previous day: 10/16 0701 - 10/17 0700 In: 3668.6 [P.O.:950; I.V.:2468.6; IV Piggyback:250] Out: 3100 [Urine:3075; Blood:25] Intake/Output this shift: Total I/O In: 1512.8 [P.O.:360; I.V.:952.8; IV Piggyback:200] Out: 900 [Urine:900]  Physical Exam: Work of breathing is not labored.  Temps down  Lab Results:  Results for orders placed or performed during the hospital encounter of 02/20/16 (from the past 48 hour(s))  Comprehensive metabolic panel     Status: Abnormal   Collection Time: 02/20/16  4:30 PM  Result Value Ref Range   Sodium 136 135 - 145 mmol/L   Potassium 3.8 3.5 - 5.1 mmol/L   Chloride 102 101 - 111 mmol/L   CO2 23 22 - 32 mmol/L   Glucose, Bld 130 (H) 65 - 99 mg/dL   BUN 17 6 - 20 mg/dL   Creatinine, Ser 0.83 0.44 - 1.00 mg/dL   Calcium 9.2 8.9 - 10.3 mg/dL   Total Protein 7.1 6.5 - 8.1 g/dL   Albumin 4.2 3.5 - 5.0 g/dL   AST 23 15 - 41 U/L   ALT 25 14 - 54 U/L   Alkaline Phosphatase 90 38 - 126 U/L   Total Bilirubin 0.8 0.3 - 1.2 mg/dL   GFR calc non Af Amer >60 >60 mL/min   GFR calc Af Amer >60 >60 mL/min    Comment: (NOTE) The eGFR has been calculated using the CKD EPI equation. This calculation has not been validated in all clinical situations. eGFR's persistently <60 mL/min signify possible Chronic Kidney Disease.    Anion gap 11 5 - 15  Culture, blood (Routine X 2)     Status: None (Preliminary result)   Collection Time: 02/20/16  4:30 PM  Result Value Ref Range   Specimen Description BLOOD RIGHT  WRIST    Special Requests BOTTLES DRAWN AEROBIC AND ANAEROBIC 5CC EA    Culture PENDING    Report Status PENDING   Culture, blood (Routine X 2)     Status: None (Preliminary result)   Collection Time: 02/20/16  4:40 PM  Result Value Ref Range   Specimen Description BLOOD RIGHT ARM    Special Requests BOTTLES DRAWN AEROBIC AND ANAEROBIC 5CC EA    Culture PENDING    Report Status PENDING   CBC WITH DIFFERENTIAL     Status: Abnormal   Collection Time: 02/20/16  4:40 PM  Result Value Ref Range   WBC 22.6 (H) 4.0 - 10.5 K/uL   RBC 4.38 3.87 - 5.11 MIL/uL   Hemoglobin 13.5 12.0 - 15.0 g/dL   HCT 39.3 36.0 - 46.0 %   MCV 89.7 78.0 - 100.0 fL   MCH 30.8 26.0 - 34.0 pg   MCHC 34.4 30.0 - 36.0 g/dL   RDW 13.3 11.5 - 15.5 %   Platelets 254 150 - 400 K/uL   Neutrophils Relative % 86 %   Neutro Abs 19.2 (H) 1.7 - 7.7 K/uL   Lymphocytes Relative 7 %   Lymphs Abs 1.7 0.7 - 4.0 K/uL   Monocytes Relative 6 %  Monocytes Absolute 1.4 (H) 0.1 - 1.0 K/uL   Eosinophils Relative 1 %   Eosinophils Absolute 0.3 0.0 - 0.7 K/uL   Basophils Relative 0 %   Basophils Absolute 0.1 0.0 - 0.1 K/uL  Troponin I     Status: None   Collection Time: 02/20/16  4:40 PM  Result Value Ref Range   Troponin I <0.03 <0.03 ng/mL  I-Stat CG4 Lactic Acid, ED  (not at  [facility])     Status: None   Collection Time: 02/20/16  4:48 PM  Result Value Ref Range   Lactic Acid, Venous 1.75 0.5 - 1.9 mmol/L  Urinalysis, Routine w reflex microscopic     Status: Abnormal   Collection Time: 02/20/16 10:21 PM  Result Value Ref Range   Color, Urine YELLOW YELLOW   APPearance CLOUDY (A) CLEAR   Specific Gravity, Urine 1.018 1.005 - 1.030   pH 5.5 5.0 - 8.0   Glucose, UA NEGATIVE NEGATIVE mg/dL   Hgb urine dipstick NEGATIVE NEGATIVE   Bilirubin Urine NEGATIVE NEGATIVE   Ketones, ur NEGATIVE NEGATIVE mg/dL   Protein, ur NEGATIVE NEGATIVE mg/dL   Nitrite NEGATIVE NEGATIVE   Leukocytes, UA NEGATIVE NEGATIVE    Comment: MICROSCOPIC  NOT DONE ON URINES WITH NEGATIVE PROTEIN, BLOOD, LEUKOCYTES, NITRITE, OR GLUCOSE <1000 mg/dL.  Urine culture     Status: None   Collection Time: 02/20/16 10:21 PM  Result Value Ref Range   Specimen Description URINE, CLEAN CATCH    Special Requests NONE    Culture NO GROWTH Performed at [facility]     Report Status 02/22/2016 FINAL   Basic metabolic panel     Status: Abnormal   Collection Time: 02/21/16  4:14 AM  Result Value Ref Range   Sodium 134 (L) 135 - 145 mmol/L   Potassium 4.7 3.5 - 5.1 mmol/L    Comment: DELTA CHECK NOTED SLIGHT HEMOLYSIS    Chloride 103 101 - 111 mmol/L   CO2 24 22 - 32 mmol/L   Glucose, Bld 174 (H) 65 - 99 mg/dL   BUN 21 (H) 6 - 20 mg/dL   Creatinine, Ser 1.36 (H) 0.44 - 1.00 mg/dL   Calcium 7.9 (L) 8.9 - 10.3 mg/dL   GFR calc non Af Amer 43 (L) >60 mL/min   GFR calc Af Amer 50 (L) >60 mL/min    Comment: (NOTE) The eGFR has been calculated using the CKD EPI equation. This calculation has not been validated in all clinical situations. eGFR's persistently <60 mL/min signify possible Chronic Kidney Disease.    Anion gap 7 5 - 15  CBC     Status: Abnormal   Collection Time: 02/21/16  4:14 AM  Result Value Ref Range   WBC 25.4 (H) 4.0 - 10.5 K/uL   RBC 3.91 3.87 - 5.11 MIL/uL   Hemoglobin 12.0 12.0 - 15.0 g/dL   HCT 35.7 (L) 36.0 - 46.0 %   MCV 91.3 78.0 - 100.0 fL   MCH 30.7 26.0 - 34.0 pg   MCHC 33.6 30.0 - 36.0 g/dL   RDW 13.9 11.5 - 15.5 %   Platelets 216 150 - 400 K/uL  Surgical pcr screen     Status: Abnormal   Collection Time: 02/21/16 10:53 PM  Result Value Ref Range   MRSA, PCR NEGATIVE NEGATIVE   Staphylococcus aureus POSITIVE (A) NEGATIVE    Comment:        The Xpert SA Assay (FDA approved for NASAL specimens in patients over 21  years of age), is one component of a comprehensive surveillance program.  Test performance has been validated by [facility] for patients greater than or equal to 49 year old. It is not  intended to diagnose infection nor to guide or monitor treatment.   Anaerobic culture     Status: None (Preliminary result)   Collection Time: 02/21/16 11:43 PM  Result Value Ref Range   Specimen Description WOUND LEFT BREAST    Special Requests      PATIENT ON FOLLOWING VANCOMYCIN,FLAGYLL AND ACTRANAM   Gram Stain      ABUNDANT WBC PRESENT,BOTH PMN AND MONONUCLEAR MODERATE GRAM POSITIVE COCCI IN PAIRS IN CLUSTERS    Culture PENDING    Report Status PENDING   Aerobic Culture (superficial specimen)     Status: None (Preliminary result)   Collection Time: 02/21/16 11:43 PM  Result Value Ref Range   Specimen Description WOUND LEFT BREAST    Special Requests      PATIENT ON FOLLOWING VANCOMYCIN, FLAGYLL AND ACTRANAM   Gram Stain      MODERATE WBC PRESENT,BOTH PMN AND MONONUCLEAR FEW GRAM POSITIVE COCCI IN PAIRS    Culture PENDING    Report Status PENDING   Lactic acid, plasma     Status: None   Collection Time: 02/22/16 12:34 AM  Result Value Ref Range   Lactic Acid, Venous 0.7 0.5 - 1.9 mmol/L  CBC     Status: Abnormal   Collection Time: 02/22/16  3:09 AM  Result Value Ref Range   WBC 23.0 (H) 4.0 - 10.5 K/uL    Comment: WHITE COUNT CONFIRMED ON SMEAR   RBC 3.18 (L) 3.87 - 5.11 MIL/uL   Hemoglobin 10.0 (L) 12.0 - 15.0 g/dL   HCT 30.5 (L) 36.0 - 46.0 %   MCV 95.9 78.0 - 100.0 fL   MCH 31.4 26.0 - 34.0 pg   MCHC 32.8 30.0 - 36.0 g/dL   RDW 14.3 11.5 - 15.5 %   Platelets 188 150 - 400 K/uL  Basic metabolic panel     Status: Abnormal   Collection Time: 02/22/16  3:09 AM  Result Value Ref Range   Sodium 136 135 - 145 mmol/L   Potassium 4.1 3.5 - 5.1 mmol/L   Chloride 110 101 - 111 mmol/L   CO2 21 (L) 22 - 32 mmol/L   Glucose, Bld 190 (H) 65 - 99 mg/dL   BUN 18 6 - 20 mg/dL   Creatinine, Ser 1.13 (H) 0.44 - 1.00 mg/dL   Calcium 7.4 (L) 8.9 - 10.3 mg/dL   GFR calc non Af Amer 54 (L) >60 mL/min   GFR calc Af Amer >60 >60 mL/min    Comment: (NOTE) The eGFR has been  calculated using the CKD EPI equation. This calculation has not been validated in all clinical situations. eGFR's persistently <60 mL/min signify possible Chronic Kidney Disease.    Anion gap 5 5 - 15    Radiology/Results: Dg Chest 2 View  Result Date: 02/20/2016 CLINICAL DATA:  Fever. EXAM: CHEST  2 VIEW COMPARISON:  None. FINDINGS: Cardiomegaly noted. Right lower lobe airspace disease is compatible with pneumonia. There is no evidence of pulmonary edema, suspicious pulmonary nodule/mass, pleural effusion, or pneumothorax. No acute bony abnormalities are identified. IMPRESSION: Right lower lobe airspace disease likely representing pneumonia. Radiographic follow-up to resolution is recommended. Cardiomegaly. Electronically Signed   By: Margarette Canada M.D.   On: 02/20/2016 18:03    Anti-infectives: Anti-infectives    Start  Dose/Rate Route Frequency Ordered Stop   02/22/16 1200  piperacillin-tazobactam (ZOSYN) IVPB 3.375 g     3.375 g 12.5 mL/hr over 240 Minutes Intravenous Every 8 hours 02/22/16 1135     02/21/16 1200  vancomycin (VANCOCIN) IVPB 750 mg/150 ml premix     750 mg 150 mL/hr over 60 Minutes Intravenous Every 12 hours 02/21/16 0753     02/20/16 2359  vancomycin (VANCOCIN) IVPB 1000 mg/200 mL premix  Status:  Discontinued     1,000 mg 200 mL/hr over 60 Minutes Intravenous Every 12 hours 02/20/16 1954 02/21/16 0753   02/20/16 1645  aztreonam (AZACTAM) 2 g in dextrose 5 % 50 mL IVPB     2 g 100 mL/hr over 30 Minutes Intravenous  Once 02/20/16 1637 02/20/16 1835   02/20/16 1645  metroNIDAZOLE (FLAGYL) IVPB 500 mg  Status:  Discontinued     500 mg 100 mL/hr over 60 Minutes Intravenous  Once 02/20/16 1637 02/20/16 1923   02/20/16 1645  vancomycin (VANCOCIN) IVPB 1000 mg/200 mL premix     1,000 mg 200 mL/hr over 60 Minutes Intravenous  Once 02/20/16 1637 02/20/16 1935      Assessment/Plan: Problem List: Patient Active Problem List   Diagnosis Date Noted  . Breast cancer  of upper-outer quadrant of left female breast ([place]) 01/03/2016  . 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 Spondylolisthesis of lumbar region 04/28/2013    Class: Chronic  . Dehydration 08/09/2011  . Hypertension 08/09/2011  . Hyperlipidemia LDL goal < 100 08/09/2011  . Colitis 08/09/2011  . TIA (transient ischemic attack) 08/09/2011  . ANXIETY 02/24/2010  . DEPRESSION 02/24/2010  . FLATULENCE-GAS-BLOATING 02/07/2008  . OTH&UNSPEC NONINFECTIOUS GASTROENTERITIS&COLITIS 12/12/2007  . DIARRHEA 10/10/2007  . ABDOMINAL PAIN, GENERALIZED 10/10/2007    Drainage of infected left lumpectomy; cultures still pending 1 Day Post-Op    LOS: 1 day   Matt B. Hassell Done, MD, [facility] [phone] beeper [phone]  02/22/2016 12:01 PM

## 2016-02-22 NOTE — Progress Notes (Signed)
[facility] Progress Note  1 Day Post-Op  Subjective: Pain at incision site rated 4/10. Expresses some frustration regarding her care - "i knew it was getting infected on Friday and no one did anything about it". Denies chills last night. Denies nausea/voming.  Objective: Vital signs in last 24 hours: Temp:  [97.8 F (36.6 C)-101.7 F (38.7 C)] 97.8 F (36.6 C) (10/17 0400) Pulse Rate:  [76-110] 85 (10/17 0600) Resp:  [14-22] 16 (10/17 0600) BP: (73-118)/(36-77) 118/48 (10/17 0600) SpO2:  [87 %-100 %] 99 % (10/17 0600) Last BM Date: 02/20/16  Intake/Output from previous day: 10/16 0701 - 10/17 0700 In: 3668.6 [P.O.:950; I.V.:2468.6; IV Piggyback:250] Out: 3100 [Urine:3075; Blood:25] Intake/Output this shift: No intake/output data recorded.  PE: Gen:  Alert, NAD, cooperative Pulm:  CTA, no W/R/R Breast exam: left breast with surgical dressing in place - some dried sanguinous drainage on bandage. Still with significant surrounding erythema.   Lab Results:   Recent Labs  02/21/16 0414 02/22/16 0309  WBC 25.4* 23.0*  HGB 12.0 10.0*  HCT 35.7* 30.5*  PLT 216 188   BMET  Recent Labs  02/20/16 1630 02/21/16 0414  NA 136 134*  K 3.8 4.7  CL 102 103  CO2 23 24  GLUCOSE 130* 174*  BUN 17 21*  CREATININE 0.83 1.36*  CALCIUM 9.2 7.9*   PT/INR No results for input(s): LABPROT, INR in the last 72 hours. CMP     Component Value Date/Time   NA 134 (L) 02/21/2016 0414   K 4.7 02/21/2016 0414   CL 103 02/21/2016 0414   CO2 24 02/21/2016 0414   GLUCOSE 174 (H) 02/21/2016 0414   BUN 21 (H) 02/21/2016 0414   CREATININE 1.36 (H) 02/21/2016 0414   CALCIUM 7.9 (L) 02/21/2016 0414   PROT 7.1 02/20/2016 1630   ALBUMIN 4.2 02/20/2016 1630   AST 23 02/20/2016 1630   ALT 25 02/20/2016 1630   ALKPHOS 90 02/20/2016 1630   BILITOT 0.8 02/20/2016 1630   GFRNONAA 43 (L) 02/21/2016 0414   GFRAA 50 (L) 02/21/2016 0414   Lipase  No results found for:  LIPASE  Studies/Results: Dg Chest 2 View  Result Date: 02/20/2016 CLINICAL DATA:  Fever. EXAM: CHEST  2 VIEW COMPARISON:  None. FINDINGS: Cardiomegaly noted. Right lower lobe airspace disease is compatible with pneumonia. There is no evidence of pulmonary edema, suspicious pulmonary nodule/mass, pleural effusion, or pneumothorax. No acute bony abnormalities are identified. IMPRESSION: Right lower lobe airspace disease likely representing pneumonia. Radiographic follow-up to resolution is recommended. Cardiomegaly. Electronically Signed   By: Margarette Canada M.D.   On: 02/20/2016 18:03    Anti-infectives: Anti-infectives    Start     Dose/Rate Route Frequency Ordered Stop   02/21/16 1200  vancomycin (VANCOCIN) IVPB 750 mg/150 ml premix     750 mg 150 mL/hr over 60 Minutes Intravenous Every 12 hours 02/21/16 0753     02/20/16 2359  vancomycin (VANCOCIN) IVPB 1000 mg/200 mL premix  Status:  Discontinued     1,000 mg 200 mL/hr over 60 Minutes Intravenous Every 12 hours 02/20/16 1954 02/21/16 0753   02/20/16 1645  aztreonam (AZACTAM) 2 g in dextrose 5 % 50 mL IVPB     2 g 100 mL/hr over 30 Minutes Intravenous  Once 02/20/16 1637 02/20/16 1835   02/20/16 1645  metroNIDAZOLE (FLAGYL) IVPB 500 mg  Status:  Discontinued     500 mg 100 mL/hr over 60 Minutes Intravenous  Once 02/20/16 1637 02/20/16 1923  02/20/16 1645  vancomycin (VANCOCIN) IVPB 1000 mg/200 mL premix     1,000 mg 200 mL/hr over 60 Minutes Intravenous  Once 02/20/16 1637 02/20/16 1935     Assessment/Plan Abscess of left breast  S/p incision and drainage,02/21/16, Dr. Excell Seltzer  - pt re-admitted with cellulitis of breast after left breast lumpectomy 02/04/16, Dr. Lucia Gaskins - patient conditioned worsened 10/16 - fever, hypotension. Concern for abscess and developing sepsis >> taken to OR by Dr. Excell Seltzer. - cultures pending  - WBC 23.0  Hypotension  - concern for sepsis, CCM managing - Neo @ 60 mL/hr; MAP goal 65 mmHg - lactate WNL,  making urine  - possible placement of arterial line   HTN HLD  FEN: IVF ID: vancomycin 10/15>> VTE: [place] heparin, SCD's  Plan: follow cultures; dressing change this afternoon.   LOS: 1 day    [facility] 02/22/2016, 7:38 AM Pager: [phone] Consults: [phone] Mon-Fri 7:00 am-4:30 pm Sat-Sun 7:00 am-11:30 am

## 2016-02-22 NOTE — Significant Event (Signed)
Rapid Response Event Note  Overview: Time Called: 2155 Arrival Time: 2200 Event Type: Hypotension  Initial Focused Assessment:Pt c/o increased pain left breast. Has had fever and Bp was low. Pt. Alert warm and dry.    Interventions:NS bolus given. Dr. Excell Seltzer came and decided to take her  to OR for I&D. Observed v/s and prepared for OR.  Plan of Care (if not transferred):  Event Summary: Name of Physician Notified: Dr. Excell Seltzer at 2200    at    Outcome: Transferred (Comment) (went to OR then 1222)  Event End Time: 2310  Pricilla Riffle

## 2016-02-22 NOTE — Evaluation (Signed)
One-Time Physical Therapy Evaluation Patient Details Name: [name] MRN: [id number] DOB: [birth year] Today's Date: 02/22/2016   History of Present Illness  Pt is a 55 y/o female with PMH of recent breast cancer s/p lumpectomy, depression, arthritis, anxiety, sciatica and HTN admitted for cellulitis and possible sepsis of lumpectomy insicion site.   Clinical Impression  Pt evaluated by PT with no further acute care needs identified. All education has been completed and the patient has no further questions. See below for any follow-up PT or equipment needs. PT is signing off. Pt was able to perform all mobility independently and is agreeable to seeking outpatient PT to address her R LE deficits, which she reports pre and post back surgery and now are inhibiting her active lifestyle. Pt was educated about pro bono outpatient clinic in the area if insurance will not cover.     Follow Up Recommendations Outpatient PT    Equipment Recommendations  None recommended by PT    Recommendations for Other Services       Precautions / Restrictions Precautions Precautions: Fall Restrictions Weight Bearing Restrictions: No      Mobility  Bed Mobility Overal bed mobility: Independent             General bed mobility comments: no verbal cues required for safety or technique  Transfers Overall transfer level: Independent Equipment used: None             General transfer comment: able to perform with safe technique independently  Ambulation/Gait Ambulation/Gait assistance: Supervision Ambulation Distance (Feet): 250 Feet Assistive device: None Gait Pattern/deviations: Step-through pattern;WFL(Within Functional Limits)     General Gait Details: supervision for line management  Stairs            Wheelchair Mobility    Modified Rankin (Stroke Patients Only)       Balance Overall balance assessment: No apparent balance deficits (not formally assessed)                                            Pertinent Vitals/Pain Pain Assessment: No/denies pain (only with mobility) Pain Location: pain throughout R LE with mobility Pain Descriptors / Indicators: Discomfort;Burning Pain Intervention(s): Limited activity within patient's tolerance;Monitored during session    Home Living Family/patient expects to be discharged to:: Private residence Living Arrangements: Children Available Help at Discharge: Family;Available PRN/intermittently Type of Home: House Home Access: Stairs to enter Entrance Stairs-Rails: None Entrance Stairs-Number of Steps: 2 (6 at back entrance with bil handrails) Home Layout: One level Home Equipment: None      Prior Function Level of Independence: Independent               Hand Dominance        Extremity/Trunk Assessment               Lower Extremity Assessment: RLE deficits/detail RLE Deficits / Details: pt reports burning sensation with light touch dermatomes L1-L3; MMT appeared bil 4/5       Communication   Communication: No difficulties  Cognition Arousal/Alertness: Awake/alert Behavior During Therapy: WFL for tasks assessed/performed;Impulsive (slightly impulsive) Overall Cognitive Status: Within Functional Limits for tasks assessed                      General Comments      Exercises     Assessment/Plan    PT Assessment Patent  does not need any further PT services;All further PT needs can be met in the next venue of care  PT Problem List Other (comment) (reported R LE atrophy, hx back surgery, prior fall on hip )          PT Treatment Interventions      PT Goals (Current goals can be found in the Care Plan section)  Acute Rehab PT Goals PT Goal Formulation: All assessment and education complete, DC therapy    Frequency     Barriers to discharge        Co-evaluation               End of Session Equipment Utilized During Treatment: Gait  belt Activity Tolerance: Patient tolerated treatment well Patient left: in bed;with call bell/phone within reach           Time: 1430-1503 PT Time Calculation (min) (ACUTE ONLY): 33 min   Charges:   PT Evaluation $PT Eval Moderate Complexity: 1 Procedure     PT G CodesDewitt Hoes Mar 06, 2016, 4:36 PM Dewitt Hoes, SPT

## 2016-02-22 NOTE — Anesthesia Postprocedure Evaluation (Signed)
Anesthesia Post Note  Patient: [name]  Procedure(s) Performed: Procedure(s) (LRB): INCISION AND DRAINAGE ABSCESS (Left)  Patient location during evaluation: PACU Anesthesia Type: General Level of consciousness: awake and alert Pain management: pain level controlled Vital Signs Assessment: post-procedure vital signs reviewed and stable Respiratory status: spontaneous breathing, nonlabored ventilation, respiratory function stable and patient connected to nasal cannula oxygen Cardiovascular status: blood pressure returned to baseline Postop Assessment: no signs of nausea or vomiting Anesthetic complications: no Comments: Management per sepsis protocol. IVFs, lactic acid drawn, goal MAP>65. Sent to ICU stepdown to continue    Last Vitals:  Vitals:   02/22/16 0430 02/22/16 0512  BP: (!) 87/61 (!) 117/47  Pulse: 90 76  Resp: 18 17  Temp:      Last Pain:  Vitals:   02/22/16 0145  TempSrc:   PainSc: Asleep                 [name]

## 2016-02-22 NOTE — Progress Notes (Signed)
Pt BP 77/63 after 2500 mL of fluid was given. MD notified. CCM consulted and new orders given. Will continue to monitor.

## 2016-02-22 NOTE — Progress Notes (Signed)
Patient is impulsive and gets out of bed without calling for help. Nurse educated patient and patient is still reluctant to ask for help.

## 2016-02-22 NOTE — Transfer of Care (Signed)
Immediate Anesthesia Transfer of Care Note  Patient: [name]  Procedure(s) Performed: Procedure(s): INCISION AND DRAINAGE ABSCESS (Left)  Patient Location: PACU  Anesthesia Type:General  Level of Consciousness: awake and alert   Airway & Oxygen Therapy: Patient Spontanous Breathing and Patient connected to face mask oxygen  Post-op Assessment: Report given to RN and Post -op Vital signs reviewed and stable  Post vital signs: Reviewed and stable  Last Vitals:  Vitals:   02/21/16 2245 02/21/16 2250  BP: 102/77 102/66  Pulse: (!) 102 (!) 103  Resp: 18 18  Temp:      Last Pain:  Vitals:   02/21/16 2136  TempSrc: Oral  PainSc:       Patients Stated Pain Goal: 2 ([id number])  Complications: No apparent anesthesia complications

## 2016-02-23 DIAGNOSIS — R6521 Severe sepsis with septic shock: Secondary | ICD-10-CM

## 2016-02-23 DIAGNOSIS — N61 Mastitis without abscess: Secondary | ICD-10-CM

## 2016-02-23 DIAGNOSIS — A419 Sepsis, unspecified organism: Secondary | ICD-10-CM

## 2016-02-23 LAB — BASIC METABOLIC PANEL
Anion gap: 4 — ABNORMAL LOW (ref 5–15)
BUN: 14 mg/dL (ref 6–20)
CALCIUM: 8.6 mg/dL — AB (ref 8.9–10.3)
CHLORIDE: 108 mmol/L (ref 101–111)
CO2: 25 mmol/L (ref 22–32)
Creatinine, Ser: 0.86 mg/dL (ref 0.44–1.00)
GFR calc non Af Amer: >60 mL/min (ref >60)
Glucose, Bld: 230 mg/dL — ABNORMAL HIGH (ref 65–99)
Potassium: 4.7 mmol/L (ref 3.5–5.1)
Sodium: 137 mmol/L (ref 135–145)

## 2016-02-23 MED ORDER — DEXTROSE-NACL 5-0.45 % IV SOLN
INTRAVENOUS | Status: DC
  Administered 2016-02-23: 10:00:00 via INTRAVENOUS

## 2016-02-23 NOTE — Progress Notes (Signed)
PULMONARY / CRITICAL CARE MEDICINE   Name: [name] MRN: [id number] DOB: 06/21/1960    ADMISSION DATE:  02/20/2016 CONSULTATION DATE:  02/22/2016  REFERRING MD:  Dr. Lucia Gaskins  CHIEF COMPLAINT:  Hypotension  BRIEF SUMMARY:  55 year old female, smoker, with PMH of breast cancer of the left breast status post lumpectomy approximately 2 weeks prior to admission. She presented to [facility] emergency department 10/15 with complaints of rash and redness 2-3 days at her recent surgical site. The rash was noted to be centered around her incision as well as several smaller dot-like areas on the ipsilateral arm. She was admitted to the hospital with a diagnosis of cellulitis and started on broad-spectrum antibiotics. 10/16, however, she developed worsening fevers, tachycardia, and hypotension. Affected area appeared to be more edematous and there was concern for abscess. She was taken to the operating room under Dr. Excell Seltzer and had abscess drained of 450 mL thin purulent fluid. She was recovered in the ICU where she continued to be hypotensive despite IVF resuscitation. PCCM consulted for hypotension.  The patient required phenylephrine overnight for BP support.  She was weaned off 10/17.  VSS.  Antibiotics continue.       SUBJECTIVE:  Pt reports soreness / pain at incision site, dressing is uncomfortable.   VITAL SIGNS: BP (!) 145/69   Pulse 79   Temp 97.9 F (36.6 C) (Oral)   Resp 14   Ht 5\' 8"  (1.727 m)   Wt 212 lb 9.6 oz (96.4 kg)   SpO2 99%   BMI 32.33 kg/m   HEMODYNAMICS:    VENTILATOR SETTINGS:    INTAKE / OUTPUT: I/O last 3 completed shifts: In: 5731.5 [P.O.:840; I.V.:4041.5; IV Piggyback:850] Out: [id number] [Urine:4575; Blood:25]  PHYSICAL EXAMINATION: General:  Obese female in NAD. PSY: flat affect, appears irritated  Neuro:  AAOx3, MAE  HEENT:  [place]/AT, PERRL, no JVD Cardiovascular:  Tachy, regular, no MRG Lungs:  Even/non-labored on RA Abdomen:  Soft,  non-tender, non-distended Musculoskeletal: No acute deformity or ROM limitation Skin:  Surgical site with dressing in place. Clean/Dry/Intact.  LABS:  BMET  Recent Labs Lab 02/21/16 0414 02/22/16 0309 02/23/16 0312  NA 134* 136 137  K 4.7 4.1 4.7  CL 103 110 108  CO2 24 21* 25  BUN 21* 18 14  CREATININE 1.36* 1.13* 0.86  GLUCOSE 174* 190* 230*    Electrolytes  Recent Labs Lab 02/21/16 0414 02/22/16 0309 02/23/16 0312  CALCIUM 7.9* 7.4* 8.6*    CBC  Recent Labs Lab 02/20/16 1640 02/21/16 0414 02/22/16 0309  WBC 22.6* 25.4* 23.0*  HGB 13.5 12.0 10.0*  HCT 39.3 35.7* 30.5*  PLT 254 216 188   Sepsis Markers  Recent Labs Lab 02/20/16 1648 02/22/16 0034  LATICACIDVEN 1.75 0.7   Liver Enzymes  Recent Labs Lab 02/20/16 1630  AST 23  ALT 25  ALKPHOS 90  BILITOT 0.8  ALBUMIN 4.2    Cardiac Enzymes  Recent Labs Lab 02/20/16 1640  TROPONINI <0.03    Imaging No results found.   STUDIES:    CULTURES: Blood 10/15 > Urine 10/15 > neg Wound 10/16 >  ANTIBIOTICS: Aztreonam 10/15 > Vancomycin 10/15 >  SIGNIFICANT EVENTS: 09/29  lumpectomy 10/15  admit with surgical site cellulitis 10/16  to OR for abscess repair 10/17  post-op hypotension, required neo / weaned off  LINES/TUBES:   DISCUSSION: 55 year old female status post recent lumpectomy for left breast cancer. Admitted 10/15 with apparent cellulitis of the surgical site. Worsened 10/16  with concern for abscess, taken to OR and drained. Postoperatively was hypotensive with concern for sepsis. Treated with broad-spectrum antibiotics, gentle IVF hydration, and phenylephrine for BP support. Cuff blood pressures were running in the 70s - her mental status was fully intact, lactic acid normal, and making urine. Neo was weaned off 10/17.  VSS  ASSESSMENT / PLAN:  PULMONARY A: Tobacco Abuse P:   Smoking cessation counseling > pt "does not want to hear it"  CARDIOVASCULAR A:   Hypotension - sepsis vs post-operative volume depletion. Resolved.  H/o HTN, HLD P:  Telemetry monitoring, ok to transition to medical floor  MAP goal > 85mmHg Norvasc 5mg  QD  Hold home lisinopril/HCTZ  RENAL A:   AKI - in setting of hypotension, suspected sepsis P:   Trend BMP / UOP  Correct electrolytes as indicated. Change IVF to D51/2 NS @ KVO   GASTROINTESTINAL A:   Lymphocytic Colitis P:   Monitor Florastor   HEMATOLOGIC A:   Anemia (baseline Hgb 12) P:  Monitor H&H Transfuse per ICU guidelines.  Heparin for DVT prophylaxis   INFECTIOUS A:   Severe Sepsis - secondary to L breast abscess, cellulitis.  Source control in OR 10/16 P:   Follow cultures ABX as above Narrow abx as cutlures return  NEUROLOGIC A:   Pain P:   PRN pain control   FAMILY  - Updates: Patient updated bedside on plan of care 10/17.  - Inter-disciplinary family meet or Palliative Care meeting due by:  10/24  PCCM will be available PRN.  Please call if new needs arise.   Noe Gens, NP-C [facility] Pgr: [phone] or if no answer [phone] 02/23/2016, 9:01 AM   Attending Note:  I have examined patient, reviewed labs, studies and notes. I have discussed the case with B Ollis, and I agree with the data and plans as amended above. 55 yo woman, hx breast CA and lumpectomy 2 weeks ago. Admitted with cellulitis and associated signs sepsis, hypotension, renal insufficiency. She went to OR for surgical debridement and breast abscess drainage 10/16 pm. She returned to PACU and then ICU with shock, presumed septic shock, on phenylephrine. On my eval today her BP has stabilized and she is off pressors. She is awake and alert. Clear lungs and normal heart exam. Yesterday her abx were adjusted to vanco + zosyn (her PCN allergy was N/V). Her renal fxn has normalized. Agree with moving to floor bed, plan to narrow abx according to cx data. Please all if we can help in any way.    Baltazar Apo, MD, PhD 02/23/2016, 11:33 AM [facility] [phone] or if no answer [phone]

## 2016-02-23 NOTE — Progress Notes (Signed)
Patient ID: [name], female   DOB: November 12, 1960, 55 y.o.   MRN: [id number] [facility] Progress Note:   2 Days Post-Op  Subjective: Mental status is clear.  Feeling better.  Off pressors since yesterday.   Objective: Vital signs in last 24 hours: Temp:  [97.6 F (36.4 C)-99.2 F (37.3 C)] 97.9 F (36.6 C) (10/18 0731) Pulse Rate:  [62-102] 71 (10/18 0600) Resp:  [10-24] 17 (10/18 0600) BP: (115-158)/(45-99) 133/58 (10/18 0600) SpO2:  [92 %-100 %] 94 % (10/18 0600)  Intake/Output from previous day: 10/17 0701 - 10/18 0700 In: 4830.4 [P.O.:840; I.V.:3140.4; IV Piggyback:850] Out: 2500 [Urine:2500] Intake/Output this shift: No intake/output data recorded.  Physical Exam: Work of breathing is normal.  Breast is much less erythematous.  Dressing removed and Kerlex packing removed.  Opening packed with saline 4x4 to allow drainage.  Drainage appears serosanguinous and nonfoul smelling  Lab Results:  Results for orders placed or performed during the hospital encounter of 02/20/16 (from the past 48 hour(s))  Surgical pcr screen     Status: Abnormal   Collection Time: 02/21/16 10:53 PM  Result Value Ref Range   MRSA, PCR NEGATIVE NEGATIVE   Staphylococcus aureus POSITIVE (A) NEGATIVE    Comment:        The Xpert SA Assay (FDA approved for NASAL specimens in patients over 13 years of age), is one component of a comprehensive surveillance program.  Test performance has been validated by [facility] for patients greater than or equal to 67 year old. It is not intended to diagnose infection nor to guide or monitor treatment.   Anaerobic culture     Status: None (Preliminary result)   Collection Time: 02/21/16 11:43 PM  Result Value Ref Range   Specimen Description WOUND LEFT BREAST    Special Requests      PATIENT ON FOLLOWING VANCOMYCIN,FLAGYLL AND ACTRANAM   Gram Stain      ABUNDANT WBC PRESENT,BOTH PMN AND MONONUCLEAR MODERATE GRAM POSITIVE COCCI IN PAIRS IN  CLUSTERS    Culture PENDING    Report Status PENDING   Aerobic Culture (superficial specimen)     Status: None (Preliminary result)   Collection Time: 02/21/16 11:43 PM  Result Value Ref Range   Specimen Description WOUND LEFT BREAST    Special Requests      PATIENT ON FOLLOWING VANCOMYCIN, FLAGYLL AND ACTRANAM   Gram Stain      MODERATE WBC PRESENT,BOTH PMN AND MONONUCLEAR FEW GRAM POSITIVE COCCI IN PAIRS    Culture PENDING    Report Status PENDING   Lactic acid, plasma     Status: None   Collection Time: 02/22/16 12:34 AM  Result Value Ref Range   Lactic Acid, Venous 0.7 0.5 - 1.9 mmol/L  CBC     Status: Abnormal   Collection Time: 02/22/16  3:09 AM  Result Value Ref Range   WBC 23.0 (H) 4.0 - 10.5 K/uL    Comment: WHITE COUNT CONFIRMED ON SMEAR   RBC 3.18 (L) 3.87 - 5.11 MIL/uL   Hemoglobin 10.0 (L) 12.0 - 15.0 g/dL   HCT 30.5 (L) 36.0 - 46.0 %   MCV 95.9 78.0 - 100.0 fL   MCH 31.4 26.0 - 34.0 pg   MCHC 32.8 30.0 - 36.0 g/dL   RDW 14.3 11.5 - 15.5 %   Platelets 188 150 - 400 K/uL  Basic metabolic panel     Status: Abnormal   Collection Time: 02/22/16  3:09 AM  Result Value  Ref Range   Sodium 136 135 - 145 mmol/L   Potassium 4.1 3.5 - 5.1 mmol/L   Chloride 110 101 - 111 mmol/L   CO2 21 (L) 22 - 32 mmol/L   Glucose, Bld 190 (H) 65 - 99 mg/dL   BUN 18 6 - 20 mg/dL   Creatinine, Ser 1.13 (H) 0.44 - 1.00 mg/dL   Calcium 7.4 (L) 8.9 - 10.3 mg/dL   GFR calc non Af Amer 54 (L) >60 mL/min   GFR calc Af Amer >60 >60 mL/min    Comment: (NOTE) The eGFR has been calculated using the CKD EPI equation. This calculation has not been validated in all clinical situations. eGFR's persistently <60 mL/min signify possible Chronic Kidney Disease.    Anion gap 5 5 - 15  Basic metabolic panel     Status: Abnormal   Collection Time: 02/23/16  3:12 AM  Result Value Ref Range   Sodium 137 135 - 145 mmol/L   Potassium 4.7 3.5 - 5.1 mmol/L   Chloride 108 101 - 111 mmol/L   CO2 25 22  - 32 mmol/L   Glucose, Bld 230 (H) 65 - 99 mg/dL   BUN 14 6 - 20 mg/dL   Creatinine, Ser 0.86 0.44 - 1.00 mg/dL   Calcium 8.6 (L) 8.9 - 10.3 mg/dL   GFR calc non Af Amer >60 >60 mL/min   GFR calc Af Amer >60 >60 mL/min    Comment: (NOTE) The eGFR has been calculated using the CKD EPI equation. This calculation has not been validated in all clinical situations. eGFR's persistently <60 mL/min signify possible Chronic Kidney Disease.    Anion gap 4 (L) 5 - 15    Radiology/Results: No results found.  Anti-infectives: Anti-infectives    Start     Dose/Rate Route Frequency Ordered Stop   02/22/16 1257  vancomycin (VANCOCIN) 1,250 mg in sodium chloride 0.9 % 250 mL IVPB     1,250 mg 166.7 mL/hr over 90 Minutes Intravenous Every 12 hours 02/22/16 1258     02/22/16 1200  piperacillin-tazobactam (ZOSYN) IVPB 3.375 g     3.375 g 12.5 mL/hr over 240 Minutes Intravenous Every 8 hours 02/22/16 1135     02/21/16 1200  vancomycin (VANCOCIN) IVPB 750 mg/150 ml premix  Status:  Discontinued     750 mg 150 mL/hr over 60 Minutes Intravenous Every 12 hours 02/21/16 0753 02/22/16 1258   02/20/16 2359  vancomycin (VANCOCIN) IVPB 1000 mg/200 mL premix  Status:  Discontinued     1,000 mg 200 mL/hr over 60 Minutes Intravenous Every 12 hours 02/20/16 1954 02/21/16 0753   02/20/16 1645  aztreonam (AZACTAM) 2 g in dextrose 5 % 50 mL IVPB     2 g 100 mL/hr over 30 Minutes Intravenous  Once 02/20/16 1637 02/20/16 1835   02/20/16 1645  metroNIDAZOLE (FLAGYL) IVPB 500 mg  Status:  Discontinued     500 mg 100 mL/hr over 60 Minutes Intravenous  Once 02/20/16 1637 02/20/16 1923   02/20/16 1645  vancomycin (VANCOCIN) IVPB 1000 mg/200 mL premix     1,000 mg 200 mL/hr over 60 Minutes Intravenous  Once 02/20/16 1637 02/20/16 1935      Assessment/Plan: Problem List: Patient Active Problem List   Diagnosis Date Noted  . Breast cancer of upper-outer quadrant of left female breast ([place]) 01/03/2016  . Spinal  stenosis, lumbar region, with neurogenic claudication 04/28/2013    Class: Chronic  . Spondylolisthesis of lumbar region 04/28/2013  Class: Chronic  . Dehydration 08/09/2011  . Hypertension 08/09/2011  . Hyperlipidemia LDL goal < 100 08/09/2011  . Colitis 08/09/2011  . TIA (transient ischemic attack) 08/09/2011  . ANXIETY 02/24/2010  . DEPRESSION 02/24/2010  . FLATULENCE-GAS-BLOATING 02/07/2008  . OTH&UNSPEC NONINFECTIOUS GASTROENTERITIS&COLITIS 12/12/2007  . DIARRHEA 10/10/2007  . ABDOMINAL PAIN, GENERALIZED 10/10/2007    Cultures and sensitivity pending but appears to be Staph and Strep.  Continue IV Vancocin and transfer to floor.  2 Days Post-Op    LOS: 2 days   Matt B. Hassell Done, MD, [facility] [phone] beeper [phone]  02/23/2016 8:11 AM

## 2016-02-23 NOTE — Progress Notes (Signed)
   02/23/16 1500  Clinical Encounter Type  Visited With Patient  Visit Type Initial  Referral From Nurse  Consult/Referral To Chaplain  Spiritual Encounters  Spiritual Needs Emotional  CHP visited with patient as requested by nurse.  Provided ministry of presence, listening and prayer. Roe Coombs 02/23/16

## 2016-02-24 LAB — CBC
HCT: 33.6 % — ABNORMAL LOW (ref 36.0–46.0)
Hemoglobin: 11.3 g/dL — ABNORMAL LOW (ref 12.0–15.0)
MCH: 30.6 pg (ref 26.0–34.0)
MCHC: 33.6 g/dL (ref 30.0–36.0)
MCV: 91.1 fL (ref 78.0–100.0)
PLATELETS: 288 10*3/uL (ref 150–400)
RBC: 3.69 MIL/uL — ABNORMAL LOW (ref 3.87–5.11)
RDW: 13.8 % (ref 11.5–15.5)
WBC: 19.9 10*3/uL — ABNORMAL HIGH (ref 4.0–10.5)

## 2016-02-24 LAB — AEROBIC CULTURE W GRAM STAIN (SUPERFICIAL SPECIMEN)

## 2016-02-24 LAB — AEROBIC CULTURE  (SUPERFICIAL SPECIMEN)

## 2016-02-24 MED ORDER — CEFAZOLIN SODIUM-DEXTROSE 2-4 GM/100ML-% IV SOLN
2.0000 g | Freq: Three times a day (TID) | INTRAVENOUS | Status: DC
  Administered 2016-02-24 – 2016-02-25 (×3): 2 g via INTRAVENOUS
  Filled 2016-02-24 (×5): qty 100

## 2016-02-24 NOTE — Progress Notes (Signed)
Discharge planning, spoke with patient at beside. Patient states application for Medicaid is pending, applied prior to admission. Follow up done by financial counselor. Discussed role of HH and would evaluate for charity consideration. States daughter would be teachable caregiver for wound care. No preference for [facility] agency, contacted [facility] for referral. [phone]

## 2016-02-24 NOTE — Progress Notes (Signed)
[facility] Progress Note  3 Days Post-Op  Subjective: Afebrile and VSS. Looks well. Expresses fear/anxiety about breast infection acutely worsening. Feels the erythema has gotten a little worse compared to yesterday. Tolerating PO. Having regular BMs. Ambulating.  Objective: Vital signs in last 24 hours: Temp:  [97.6 F (36.4 C)-98.6 F (37 C)] 98.6 F (37 C) (10/19 0607) Pulse Rate:  [65-77] 65 (10/19 0607) Resp:  [18-20] 18 (10/19 0607) BP: (151-177)/(79-89) 151/89 (10/19 0607) SpO2:  [92 %-98 %] 98 % (10/19 0607) Last BM Date: 02/23/16  Intake/Output from previous day: 10/18 0701 - 10/19 0700 In: 784.2 [P.O.:360; I.V.:74.2; IV Piggyback:350] Out: 1500 [Urine:1500] Intake/Output this shift: Total I/O In: 320 [P.O.:320] Out: -   PE: Gen:  Alert, NAD, pleasant Card:  RRR, no M/G/R heard Pulm:  CTA, no W/R/R Left breast: mild erythema, improved from POD#1. Packing removed and wound base examine - healing appropriately with >80% healthy pink tissue and minimal slough. No necrosis appreciated. Would repacked with 1/2 kerlix and dry gauze on top.   Lab Results:   Recent Labs  02/22/16 0309  WBC 23.0*  HGB 10.0*  HCT 30.5*  PLT 188   BMET  Recent Labs  02/22/16 0309 02/23/16 0312  NA 136 137  K 4.1 4.7  CL 110 108  CO2 21* 25  GLUCOSE 190* 230*  BUN 18 14  CREATININE 1.13* 0.86  CALCIUM 7.4* 8.6*   PT/INR No results for input(s): LABPROT, INR in the last 72 hours. CMP     Component Value Date/Time   NA 137 02/23/2016 0312   K 4.7 02/23/2016 0312   CL 108 02/23/2016 0312   CO2 25 02/23/2016 0312   GLUCOSE 230 (H) 02/23/2016 0312   BUN 14 02/23/2016 0312   CREATININE 0.86 02/23/2016 0312   CALCIUM 8.6 (L) 02/23/2016 0312   PROT 7.1 02/20/2016 1630   ALBUMIN 4.2 02/20/2016 1630   AST 23 02/20/2016 1630   ALT 25 02/20/2016 1630   ALKPHOS 90 02/20/2016 1630   BILITOT 0.8 02/20/2016 1630   GFRNONAA >60 02/23/2016 0312   GFRAA >60  02/23/2016 0312   Lipase  No results found for: LIPASE  Studies/Results: No results found.  Anti-infectives: Anti-infectives    Start     Dose/Rate Route Frequency Ordered Stop   02/22/16 1257  vancomycin (VANCOCIN) 1,250 mg in sodium chloride 0.9 % 250 mL IVPB     1,250 mg 166.7 mL/hr over 90 Minutes Intravenous Every 12 hours 02/22/16 1258     02/22/16 1200  piperacillin-tazobactam (ZOSYN) IVPB 3.375 g     3.375 g 12.5 mL/hr over 240 Minutes Intravenous Every 8 hours 02/22/16 1135     02/21/16 1200  vancomycin (VANCOCIN) IVPB 750 mg/150 ml premix  Status:  Discontinued     750 mg 150 mL/hr over 60 Minutes Intravenous Every 12 hours 02/21/16 0753 02/22/16 1258   02/20/16 2359  vancomycin (VANCOCIN) IVPB 1000 mg/200 mL premix  Status:  Discontinued     1,000 mg 200 mL/hr over 60 Minutes Intravenous Every 12 hours 02/20/16 1954 02/21/16 0753   02/20/16 1645  aztreonam (AZACTAM) 2 g in dextrose 5 % 50 mL IVPB     2 g 100 mL/hr over 30 Minutes Intravenous  Once 02/20/16 1637 02/20/16 1835   02/20/16 1645  metroNIDAZOLE (FLAGYL) IVPB 500 mg  Status:  Discontinued     500 mg 100 mL/hr over 60 Minutes Intravenous  Once 02/20/16 1637 02/20/16 1923   02/20/16  1645  vancomycin (VANCOCIN) IVPB 1000 mg/200 mL premix     1,000 mg 200 mL/hr over 60 Minutes Intravenous  Once 02/20/16 1637 02/20/16 1935       Assessment/Plan Abscess of left breast  S/p incision and drainage,02/21/16, Dr. Excell Seltzer  - pt re-admitted with cellulitis of breast after left breast lumpectomy 02/04/16, Dr. Lucia Gaskins - patient conditioned worsened 10/16 - fever, hypotension. Concern for abscess and developing sepsis >> taken to OR by Dr. Excell Seltzer. - cultures: staph aureus; awaiting sensitivities; will d/c Zosyn as Vanc should cover. Hopeful to narrow coverage in next 24 hours. - tolerating daily dressing changes at bedside  - WBC slowly trending down - 23.0 from 25.4   Hypotension  - concern for sepsis; resolved   - off of Neo since 10/17  HTN HLD  FEN: IVF ID: one doze Aztreonam 10/15. Zosyn 10/17-10/18, vancomycin 10/15>> VTE: [address] heparin, SCD's  Plan: BID dressing changes, follow CBC, await culture sensitivities Will discuss timing of discharge with MD.   LOS: 3 days    Jill Alexanders , [facility] Surgery 02/24/2016, 10:54 AM Pager: [phone] Consults: [phone] Mon-Fri 7:00 am-4:30 pm Sat-Sun 7:00 am-11:30 am

## 2016-02-25 LAB — CBC
HEMATOCRIT: 37.8 % (ref 36.0–46.0)
HEMOGLOBIN: 12.6 g/dL (ref 12.0–15.0)
MCH: 30.5 pg (ref 26.0–34.0)
MCHC: 33.3 g/dL (ref 30.0–36.0)
MCV: 91.5 fL (ref 78.0–100.0)
Platelets: 303 10*3/uL (ref 150–400)
RBC: 4.13 MIL/uL (ref 3.87–5.11)
RDW: 13.8 % (ref 11.5–15.5)
WBC: 17.3 10*3/uL — AB (ref 4.0–10.5)

## 2016-02-25 MED ORDER — FAMOTIDINE 20 MG PO TABS
20.0000 mg | ORAL_TABLET | Freq: Two times a day (BID) | ORAL | Status: DC
  Administered 2016-02-25: 20 mg via ORAL
  Filled 2016-02-25: qty 1

## 2016-02-25 MED ORDER — HYDROCODONE-ACETAMINOPHEN 5-325 MG PO TABS: 1.0000 | ORAL_TABLET | Freq: Four times a day (QID) | ORAL | 0 refills | Status: DC | PRN

## 2016-02-25 MED ORDER — SULFAMETHOXAZOLE-TRIMETHOPRIM 400-80 MG PO TABS
1.0000 | ORAL_TABLET | Freq: Two times a day (BID) | ORAL | Status: DC
  Administered 2016-02-25: 1 via ORAL
  Filled 2016-02-25: qty 1

## 2016-02-25 MED ORDER — SULFAMETHOXAZOLE-TRIMETHOPRIM 400-80 MG PO TABS: 1.0000 | ORAL_TABLET | Freq: Two times a day (BID) | ORAL | 0 refills | Status: AC

## 2016-02-25 NOTE — Progress Notes (Signed)
Discharge instructions discussed with patient and family, verbalized agreement and understanding.  Daughter at bedside, educated on dressing change, with return demonstration.  Prescription given to patient

## 2016-02-25 NOTE — Discharge Instructions (Signed)
Dressing Change °A dressing is a material placed over wounds. It keeps the wound clean, dry, and protected from further injury. This provides an environment that favors wound healing.  °BEFORE YOU BEGIN °· Get your supplies together. Things you may need include: °¨ Saline solution. °¨ Flexible gauze dressing. °¨ Medicated cream. °¨ Tape. °¨ Gloves. °¨ Abdominal dressing pads. °¨ Gauze squares. °¨ Plastic bags. °· Take pain medicine 30 minutes before the dressing change if you need it. °· Take a shower before you do the first dressing change of the day. Use plastic wrap or a plastic bag to prevent the dressing from getting wet. °REMOVING YOUR OLD DRESSING  °· Wash your hands with soap and water. Dry your hands with a clean towel. °· Put on your gloves. °· Remove any tape. °· Carefully remove the old dressing. If the dressing sticks, you may dampen it with warm water to loosen it, or follow your caregiver's specific directions. °· Remove any gauze or packing tape that is in your wound. °· Take off your gloves. °· Put the gloves, tape, gauze, or any packing tape into a plastic bag. °CHANGING YOUR DRESSING °· Open the supplies. °· Take the cap off the saline solution. °· Open the gauze package so that the gauze remains on the inside of the package. °· Put on your gloves. °· Clean your wound as told by your caregiver. °· If you have been told to keep your wound dry, follow those instructions. °· Your caregiver may tell you to do one or more of the following: °¨ Pick up the gauze. Pour the saline solution over the gauze. Squeeze out the extra saline solution. °¨ Put medicated cream or other medicine on your wound if you have been told to do so. °¨ Put the solution soaked gauze only in your wound, not on the skin around it. °¨ Pack your wound loosely or as told by your caregiver. °¨ Put dry gauze on your wound. °¨ Put abdominal dressing pads over the dry gauze if your wet gauze soaks through. °· Tape the abdominal dressing  pads in place so they will not fall off. Do not wrap the tape completely around the affected part (arm, leg, abdomen). °· Wrap the dressing pads with a flexible gauze dressing to secure it in place. °· Take off your gloves. Put them in the plastic bag with the old dressing. Tie the bag shut and throw it away. °· Keep the dressing clean and dry until your next dressing change. °· Wash your hands. °SEEK MEDICAL CARE IF: °· Your skin around the wound looks red. °· Your wound feels more tender or sore. °· You see pus in the wound. °· Your wound smells bad. °· You have a fever. °· Your skin around the wound has a rash that itches and burns. °· You see black or yellow skin in your wound that was not there before. °· You feel nauseous, throw up, and feel very tired. °  °This information is not intended to replace advice given to you by your health care provider. Make sure you discuss any questions you have with your health care provider. °  °Document Released: 06/01/2004 Document Revised: 07/17/2011 Document Reviewed: 03/06/2011 °Elsevier Interactive Patient Education ©2016 Elsevier Inc. ° °

## 2016-02-26 LAB — CULTURE, BLOOD (ROUTINE X 2)
Culture: NO GROWTH
Culture: NO GROWTH

## 2016-02-27 LAB — ANAEROBIC CULTURE

## 2016-02-29 ENCOUNTER — Encounter (HOSPITAL_COMMUNITY): Payer: Self-pay

## 2016-02-29 NOTE — Discharge Summary (Signed)
[facility] Discharge Summary   Patient ID: [name] MRN: [id number] DOB/AGE: 54-Aug-1962 54 y.o.  Admit date: 02/20/2016 Discharge date: 02/25/2016  Admitting Diagnosis: Cellulitis of left breast  Discharge Diagnosis Patient Active Problem List   Diagnosis Date Noted  . Cellulitis of breast   . Septic shock ([place])   . Breast cancer of upper-outer quadrant of left female breast ([place]) 01/03/2016  . 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 Spondylolisthesis of lumbar region 04/28/2013    Class: Chronic  . Dehydration 08/09/2011  . Hypertension 08/09/2011  . Hyperlipidemia LDL goal < 100 08/09/2011  . Colitis 08/09/2011  . TIA (transient ischemic attack) 08/09/2011  . ANXIETY 02/24/2010  . DEPRESSION 02/24/2010  . FLATULENCE-GAS-BLOATING 02/07/2008  . OTH&UNSPEC NONINFECTIOUS GASTROENTERITIS&COLITIS 12/12/2007  . DIARRHEA 10/10/2007  . ABDOMINAL PAIN, GENERALIZED 10/10/2007   Consultants Critical Care medicine 02/22/16 - Dr. Lamonte Sakai  Procedures Dr. Marland Kitchen [place] (02/21/16) - Incision and drainage left breast abscess  Hospital Course:  55 y/o female about 2 weeks s/p left breast lumpectomy and sentinel node mapping by Dr. Lucia Gaskins for a left breast cancer who presented to [facility] ED with redness over her left breast. Associated symptoms include pain/tenderness.  Findings were consistent with cellulitis and no fluctuance or drainage from surgical site appreciated on exam  Patient was admitted, placed on IV antibiotics, and observed. Late on hospital day #1 the patients pain and swelling were increasing and she became hypotensive (in the 70's) and tachycardic. The decision was made to proceed with the procedure above. The patient tolerated procedure well and was transferred to the ICU for continued monitoring and management of hypotension refractory to IV fluid resuscitation. CCM  Was consulted and the  patient was placed on phenylephrine for blood pressure support, which was successfully weaned over a less than 24 hour period. Wound cultures positive for staph aureus and the patients antibiotic regiment was tailored appropriately. On POD#2 the patient was transferred to the floor.  On POD#4, the patient was voiding well, tolerating diet, ambulating well, pain well controlled, vital signs stable, surgical site c/d/i and the patient felt stable for discharge. She was discharged on PO abx and BID dressing changes.  Patient will follow up in our office in 1-2 weeks and knows to call with questions or concerns.   Physical Exam: General:  Alert, NAD, pleasant, comfortable Left breast: cellulitis improving, less swollen, packing removed revealing a well-granulating wound with no necrotic tissue. Re-packed with sterile saline and kerlix.    Medication List    TAKE these medications   amitriptyline 25 MG tablet Commonly known as:  ELAVIL Take 25 mg by mouth at bedtime.   amLODipine 5 MG tablet Commonly known as:  NORVASC Take 5 mg by mouth daily.   aspirin-acetaminophen-caffeine 250-250-65 MG tablet Commonly known as:  EXCEDRIN MIGRAINE Take 2 tablets by mouth every 6 (six) hours as needed for headache.   atorvastatin 20 MG tablet Commonly known as:  LIPITOR Take 20 mg by mouth daily.   cholecalciferol 1000 units tablet Commonly known as:  VITAMIN D Take 1,000 Units by mouth daily.   HYDROcodone-acetaminophen 5-325 MG tablet Commonly known as:  NORCO/VICODIN Take 1-2 tablets by mouth every 6 (six) hours as needed for moderate pain. What changed:  reasons to take this   ibuprofen 200 MG tablet Commonly known as:  ADVIL,MOTRIN Take 400 mg by mouth every 6 (six) hours as needed for fever.   lisinopril-hydrochlorothiazide  20-12.5 MG tablet Commonly known as:  PRINZIDE,ZESTORETIC Take 1 tablet by mouth daily.   meloxicam 7.5 MG tablet Commonly known as:  MOBIC Take 7.5 mg by mouth  daily.   MULTIVITAMIN ADULT PO Take 1 tablet by mouth daily.   PROBIOTIC DAILY PO Take 1 capsule by mouth daily.   sulfamethoxazole-trimethoprim 400-80 MG tablet Commonly known as:  BACTRIM Take 1 tablet by mouth 2 (two) times daily.   TURMERIC PO Take 1 tablet by mouth daily.       Follow-up Information    NEWMAN,DAVID H, MD .   Specialty:  General Surgery Why:  Make an appointment in 1-2 weeks for post-operative follow-up.   Contact information: [address] [place] [id number] [phone]           Signed: Obie Dredge, [facility] Surgery 02/29/2016, 2:47 PM Pager: [phone] Consults: [phone] Mon-Fri 7:00 am-4:30 pm Sat-Sun 7:00 am-11:30 am

## 2016-03-01 ENCOUNTER — Telehealth: Payer: Self-pay | Admitting: *Deleted

## 2016-03-01 ENCOUNTER — Encounter: Payer: Self-pay | Admitting: Genetic Counselor

## 2016-03-01 NOTE — Telephone Encounter (Signed)
Received Oncotype Score of 13/9% Left vm for pt to return call to discuss results and next steps.

## 2016-03-08 ENCOUNTER — Ambulatory Visit
Admission: RE | Admit: 2016-03-08 | Discharge: 2016-03-08 | Disposition: A | Discharge status: Home / Self Care | Payer: Medicaid Other | Source: Ambulatory Visit | Attending: Radiation Oncology | Admitting: Radiation Oncology

## 2016-03-08 ENCOUNTER — Telehealth: Payer: Self-pay

## 2016-03-08 ENCOUNTER — Ambulatory Visit: Admission: RE | Admit: 2016-03-08 | Payer: Medicaid Other | Source: Ambulatory Visit

## 2016-03-08 DIAGNOSIS — Z881 Allergy status to other antibiotic agents status: Secondary | ICD-10-CM | POA: Insufficient documentation

## 2016-03-08 DIAGNOSIS — F17210 Nicotine dependence, cigarettes, uncomplicated: Secondary | ICD-10-CM | POA: Insufficient documentation

## 2016-03-08 DIAGNOSIS — Z170 Estrogen receptor positive status [ER+]: Secondary | ICD-10-CM | POA: Insufficient documentation

## 2016-03-08 DIAGNOSIS — Z91040 Latex allergy status: Secondary | ICD-10-CM | POA: Insufficient documentation

## 2016-03-08 DIAGNOSIS — Z9109 Other allergy status, other than to drugs and biological substances: Secondary | ICD-10-CM | POA: Insufficient documentation

## 2016-03-08 DIAGNOSIS — Z510 Encounter for antineoplastic radiation therapy: Secondary | ICD-10-CM | POA: Insufficient documentation

## 2016-03-08 DIAGNOSIS — C50412 Malignant neoplasm of upper-outer quadrant of left female breast: Secondary | ICD-10-CM | POA: Insufficient documentation

## 2016-03-08 DIAGNOSIS — Z885 Allergy status to narcotic agent status: Secondary | ICD-10-CM | POA: Insufficient documentation

## 2016-03-08 DIAGNOSIS — Z79899 Other long term (current) drug therapy: Secondary | ICD-10-CM | POA: Insufficient documentation

## 2016-03-08 NOTE — Telephone Encounter (Signed)
[name] had an appointment scheduled today for nurse eval and consult with Dr. Isidore Moos at 12:30/1:00. She has not shown for this appointment. I have looked for her and called her name in the waiting room. I have attempted to call her phone and it rings and then cuts off without being able to leave a voice mail. I

## 2016-03-15 ENCOUNTER — Telehealth: Payer: Self-pay | Admitting: *Deleted

## 2016-03-15 NOTE — Telephone Encounter (Signed)
Called pt back to let her know that the biopsy results came back and according to her oncotype results, pt does not need any chemo at this time. Dr. Lucia Gaskins did discuss results with pt from her last appt. Pt will need to proceed to  Radiation treatment once pt lumpectomy incision heals completely. Pt has follow up appt with Dr. Lucia Gaskins tomorrow morning. Pt to discuss plan of care with treatment then. Pt verbalized understanding and has no further questions at this time.

## 2016-03-15 NOTE — Telephone Encounter (Signed)
"  I may have missed a call from Dr. Lindi Adie. I just received a new phone.  I was told to expect a call with biopsy results from lumpectomy on 02-04-2016.  If Her positive need chemotherapy.  If Her negative need radiation.  I was hospitalized and septic after the lumpectomy.  Open incision from the bottom up, needs to heal.  If radiation is needed, I don't think I can do radiation with an open wound.  I F/U with surgeon tomorrow.  Same phone number [phone]."

## 2016-04-12 NOTE — Progress Notes (Addendum)
Location of Breast Cancer: Left Breast  Histology per Pathology Report: 12/29/15 Diagnosis 1. Breast, left, needle core biopsy, UOQ, 2 o'clock, 10 cm fn - INVASIVE DUCTAL CARCINOMA. 2. Lymph node, needle/core biopsy, left axillary - BENIGN LYMPH NODE. 01/07/16  Diagnosis Breast, left, needle core biopsy, upper outer quadrant - SMALL FOCUS OF INVASIVE DUCTAL CARCINOMA WITHIN SOFT TISSUE. - LYMPH NODE IS NEGATIVE FOR CARCINOMA. 02/04/16 Diagnosis 1. Breast, lumpectomy, Left - INVASIVE DUCTAL CARCINOMA, GRADE I/III, SPANNING 1.1 CM. - ATYPICAL DUCTAL HYPERPLASIA WITH CALCIFICATIONS. - LOBULAR NEOPLASIA (ATYPICAL LOBULAR HYPERPLASIA) - ONE BENIGN LYMPH NODE (0/1). - THE SURGICAL RESECTION MARGINS ARE NEGATIVE FOR DUCTAL CARCINOMA. - SEE ONCOLOGY TABLE BELOW. 2. Lymph node, sentinel, biopsy - THERE IS NO EVIDENCE OF CARCINOMA IN 1 OF 1 LYMPH NODE (0/1). 3. Lymph node, sentinel, biopsy - THERE IS NO EVIDENCE OF CARCINOMA IN 1 OF 1 LYMPH NODE (0/1). 4. Breast, excision, additional superior margin left - BENIGN FIBROADIPOSE TISSUE WITH FOCAL FAT NECROSIS. - THERE IS NO EVIDENCE OF MALIGNANCY.  Receptor Status: ER(100%), PR (90%), Her2-neu (NEG), Ki-(10%)  Did patient present with symptoms or was this found on screening mammography?: It was found on a screening mammogram.   Past/Anticipated interventions by surgeon, if any: 02/04/16 PROCEDURE:   Procedure(s): LEFT BREAST LUMPECTOMY WITH 2 NEEDLE LOCALIZATIONS AND LEFT AXILLARY SENTINEL LYMPH NODE BIOPSY, Injection of peri areolar area of breast with methylene blue (1.4 cc), single incision for both wires and left axillary node biopsy SURGEON:   [name], M.D  02/21/16 Procedure: Procedure(s): INCISION AND DRAINAGE LEFT BREAST ABSCESS  Surgeon: Excell Seltzer T   Past/Anticipated interventions by medical oncology, if any: Dr. Lindi Adie 02/15/16 Recommendations: 1. Oncotype DX testing to determine if chemotherapy would be of  any benefit (03/01/16 Oncotype score documented as 13/9%, she will not need chemotherapy). 2. Adjuvant radiation therapy followed by 3. Adjuvant antiestrogen therapy  Lymphedema issues, if any:  She denies. She has good arm mobility in her Left arm.   Pain issues, if any:  She reports pain in her Left Breast a 2/10. She also reports occasional sharp/shooting pains in her Left Breast.   SAFETY ISSUES:  Prior radiation? No  Pacemaker/ICD? No  Possible current pregnancy? No  Is the patient on methotrexate? No  Current Complaints / other details:    BP 112/82   Pulse 92   Temp 98.6 F (37 C)   Ht _0  (1.727 m)   Wt 210 lb 9.6 oz (95.5 kg)   SpO2 100% Comment: room air  BMI 32.02 kg/m    Wt Readings from Last 3 Encounters:  04/17/16 210 lb 9.6 oz (95.5 kg)  02/20/16 212 lb 9.6 oz (96.4 kg)  02/15/16 213 lb 3.2 oz (96.7 kg)      Warren Kugelman, Stephani Police, RN 04/12/2016,10:54 AM

## 2016-04-17 ENCOUNTER — Encounter: Payer: Self-pay | Admitting: Radiation Oncology

## 2016-04-17 ENCOUNTER — Ambulatory Visit
Admission: RE | Admit: 2016-04-17 | Discharge: 2016-04-17 | Disposition: A | Discharge status: Home / Self Care | Payer: Medicaid Other | Source: Ambulatory Visit | Attending: Radiation Oncology | Admitting: Radiation Oncology

## 2016-04-17 DIAGNOSIS — C50412 Malignant neoplasm of upper-outer quadrant of left female breast: Secondary | ICD-10-CM

## 2016-04-17 DIAGNOSIS — Z881 Allergy status to other antibiotic agents status: Secondary | ICD-10-CM | POA: Diagnosis not present

## 2016-04-17 DIAGNOSIS — Z510 Encounter for antineoplastic radiation therapy: Secondary | ICD-10-CM | POA: Diagnosis not present

## 2016-04-17 DIAGNOSIS — Z79899 Other long term (current) drug therapy: Secondary | ICD-10-CM | POA: Diagnosis not present

## 2016-04-17 DIAGNOSIS — Z170 Estrogen receptor positive status [ER+]: Principal | ICD-10-CM

## 2016-04-17 DIAGNOSIS — Z885 Allergy status to narcotic agent status: Secondary | ICD-10-CM | POA: Diagnosis not present

## 2016-04-17 DIAGNOSIS — F17210 Nicotine dependence, cigarettes, uncomplicated: Secondary | ICD-10-CM | POA: Diagnosis not present

## 2016-04-17 DIAGNOSIS — Z91040 Latex allergy status: Secondary | ICD-10-CM | POA: Diagnosis not present

## 2016-04-17 DIAGNOSIS — Z9109 Other allergy status, other than to drugs and biological substances: Secondary | ICD-10-CM | POA: Diagnosis not present

## 2016-04-17 NOTE — Progress Notes (Signed)
Radiation Oncology         [phone] ________________________________  Name: [name] MRN: [id number]  Date: 04/17/2016  DOB: Jan 11, 1961  Follow-Up Visit Note  Outpatient  CC: Elbert Ewings, FNP  Nicholas Lose, MD  Diagnosis:      ICD-9-CM ICD-10-CM   1. Malignant neoplasm of upper-outer quadrant of left breast in female, estrogen receptor positive ([place]) 174.4 C50.412 Ambulatory referral to Social Work   V86.0 Z17.0   Clinical Stage IA T1cNXM0, Pathologic Stage IA T1cN0M0  Left Breast UOQ Invasive Ductal Carcinoma, ER+ / PR+ / Her2(-), Grade 1  CHIEF COMPLAINT: Here to discuss management of left breast cancer  Narrative:  The patient returns today for follow-up.  She cancelled her previous appointment with me a month ago due to issues with her post op recovery.   Since consultation, on 01-07-16 she underwent biopsy of the upper outer quadrant of the left breast on 01-07-16 revealing "SMALL FOCUS OF INVASIVE DUCTAL CARCINOMA WITHIN SOFT TISSUE. LYMPH NODE IS NEGATIVE FOR CARCINOMA." She then underwent lumpectomy and SLN biopsy on 02-04-16 revealing a 1.1cm tumor, margins negative by >0.2cm.  2 sentinel and 1 non-sentinel nodes were all negative. No LVSI.  Grade I.   She was informed by med/onc that according to her oncotype results, she does not need any chemotherapy.  She was in sepsis post operatively and she has had an open wound at the lumpectomy site. It is closing but still partly open and draining.  She smokes 0.5ppd.  She is not currently working.            ALLERGIES:  is allergic to soap; adhesive [tape]; augmentin [amoxicillin-pot clavulanate]; cymbalta [duloxetine hcl]; latex; nsaids; oxycodone; chlorhexidine gluconate; and mupirocin.  Meds: Current Outpatient Prescriptions  Medication Sig Dispense Refill  . amitriptyline (ELAVIL) 25 MG tablet Take 25 mg by mouth at bedtime.    Marland Kitchen amLODipine (NORVASC) 5 MG tablet Take 5 mg by mouth daily.    Marland Kitchen  aspirin-acetaminophen-caffeine (EXCEDRIN MIGRAINE) 250-250-65 MG tablet Take 2 tablets by mouth every 6 (six) hours as needed for headache.    Marland Kitchen atorvastatin (LIPITOR) 20 MG tablet Take 20 mg by mouth daily.    . cholecalciferol (VITAMIN D) 1000 units tablet Take 1,000 Units by mouth daily.    Marland Kitchen lisinopril-hydrochlorothiazide (PRINZIDE,ZESTORETIC) 20-12.5 MG tablet Take 1 tablet by mouth daily.    . meloxicam (MOBIC) 7.5 MG tablet Take 7.5 mg by mouth daily.    . Multiple Vitamins-Minerals (MULTIVITAMIN ADULT PO) Take 1 tablet by mouth daily.     . Probiotic Product (PROBIOTIC DAILY PO) Take 1 capsule by mouth daily.     . TURMERIC PO Take 1 tablet by mouth daily.     Marland Kitchen HYDROcodone-acetaminophen (NORCO/VICODIN) 5-325 MG tablet Take 1-2 tablets by mouth every 6 (six) hours as needed for moderate pain. (Patient not taking: Reported on 04/17/2016) 30 tablet 0   No current facility-administered medications for this encounter.     Physical Findings:  height is 5' 8"  (1.727 m) and weight is 210 lb 9.6 oz (95.5 kg). Her temperature is 98.6 F (37 C). Her blood pressure is 112/82 and her pulse is 92. Her oxygen saturation is 100%. .     General: Alert and oriented, in no acute distress HEENT: Head is normocephalic. Extraocular movements are intact. Oropharynx is clear. Neck: Neck is supple, no palpable cervical or supraclavicular lymphadenopathy. Heart: Regular in rate and rhythm with no murmurs, rubs, or gallops. Chest: Clear to auscultation  bilaterally, with no rhonchi, wheezes, or rales. Abdomen: Soft, nontender, nondistended, with no rigidity or guarding. Extremities: No cyanosis or edema. Lymphatics: see Neck Exam Musculoskeletal: symmetric strength and muscle tone throughout. Neurologic: No obvious focalities. Speech is fluent.  Psychiatric: Judgment and insight are intact. Affect is appropriate. Breast exam reveals a 1cm opening at lumpectomy site, left breast UOQ, scant drainage.  No  other significant findings in bilateral breasts  Lab Findings: Lab Results  Component Value Date   WBC 17.3 (H) 02/25/2016   HGB 12.6 02/25/2016   HCT 37.8 02/25/2016   MCV 91.5 02/25/2016   PLT 303 02/25/2016      Radiographic Findings: No results found.  Impression/Plan: Left breast cancer, Stage I  We discussed adjuvant radiotherapy today.  I recommend 6-7 weeks of radiotherapy to the left breast in order to reduce risk of locoregional recurrence by 2/3.  The risks, benefits and side effects of this treatment were discussed in detail.  She understands that radiotherapy is associated with skin irritation and fatigue in the acute setting. Late effects can include cosmetic changes and rare injury to internal organs.   She is enthusiastic about proceeding with treatment. A consent form has been signed and placed in her chart.  I asked the patient today about tobacco use. The patient uses tobacco.  I advised the patient to quit and told her that smoking (currently 1/2ppd) may inhibit healing and increase side effects from her oncologic care.  Services were offered by me today including pharmacotherapy. I assessed for the willingness to attempt to quit and provided encouragement and demonstrated willingness to make referrals and/or prescriptions to help the patient attempt to quit. The patient has follow-up with the oncologic team to touch base on their tobacco use and /or cessation efforts if she is willing to engage in this. She refuses to discuss a quit date.  She states she wishes not to discuss tobacco cessation as it is counterproductive for her.  I told her I wish the best for her and am available if she changes her mind.  She was appreciative of this.  Due to delayed healing/open lumpectomy site,  I will not schedule simulation at this time. I will ask Dr. Lucia Gaskins to please contact us when she is ready for radiotherapy.  She is agreeable to this plan.      Eppie Gibson, MD

## 2016-05-04 ENCOUNTER — Encounter: Payer: Self-pay | Admitting: *Deleted

## 2016-05-04 NOTE — Progress Notes (Signed)
[facility] Psychosocial Distress Screening Clinical Social Work  Clinical Social Work was referred by distress screening protocol.  The patient scored a 5 on the Psychosocial Distress Thermometer which indicates moderate distress. Clinical Social Worker phoned pt to assess for distress and other psychosocial needs. CSW left brief message explaining role of CSW/Support Team and how to access if needed. CSW encouraged pt to return CSW call.   ONCBCN DISTRESS SCREENING 04/17/2016  Screening Type Initial Screening  Distress experienced in past week (1-10) 5  Practical problem type Work/school  Family Problem type Children  Emotional problem type Depression;Isolation/feeling alone;Feeling hopeless  Spiritual/Religous concerns type Relating to God;Loss of sense of purpose  Information Concerns Type Lack of info about treatment;Lack of info about complementary therapy choices  Physical Problem type Pain;Constipation/diarrhea;Tingling hands/feet;Skin dry/itchy  Physician notified of physical symptoms   Referral to clinical social work     Clinical Social Worker follow up needed: No.  If yes, follow up plan:  Loren Racer, [place]  [facility] Phone: [phone] Fax: [phone]

## 2016-05-15 ENCOUNTER — Ambulatory Visit
Admission: RE | Admit: 2016-05-15 | Discharge: 2016-05-15 | Disposition: A | Discharge status: Home / Self Care | Payer: Medicaid Other | Source: Ambulatory Visit | Attending: Radiation Oncology | Admitting: Radiation Oncology

## 2016-05-15 DIAGNOSIS — Z510 Encounter for antineoplastic radiation therapy: Secondary | ICD-10-CM | POA: Diagnosis not present

## 2016-05-15 DIAGNOSIS — C50412 Malignant neoplasm of upper-outer quadrant of left female breast: Secondary | ICD-10-CM

## 2016-05-15 DIAGNOSIS — Z170 Estrogen receptor positive status [ER+]: Principal | ICD-10-CM

## 2016-05-15 NOTE — Progress Notes (Signed)
  Radiation Oncology         [phone] ________________________________  Name: [name] MRN: [id number]  Date: 05/15/2016  DOB: Nov 22, 1960  SIMULATION AND TREATMENT PLANNING NOTE    Outpatient  DIAGNOSIS:     ICD-9-CM ICD-10-CM   1. Malignant neoplasm of upper-outer quadrant of left breast in female, estrogen receptor positive ([place]) 174.4 C50.412    V86.0 Z17.0     NARRATIVE:  The patient was brought to the [facility].  Identity was confirmed.  All relevant records and images related to the planned course of therapy were reviewed.  The patient freely provided informed written consent to proceed with treatment after reviewing the details related to the planned course of therapy. The consent form was witnessed and verified by the simulation staff.   I determined that the patient had completely healed from surgery per exam of the left breast today.   Then, the patient was set-up in a stable reproducible supine position for radiation therapy with her ipsilateral arm over her head, and her upper body secured in a custom-made Vac-lok device.   Homero Fellers was placed in the UOQ left breast fold. CT images were obtained.  Surface markings were placed.  The CT images were loaded into the planning software.    TREATMENT PLANNING NOTE: Treatment planning then occurred.  The radiation prescription was entered and confirmed.     A total of 3 medically necessary complex treatment devices were fabricated and supervised by me: 2 fields with MLCs for custom blocks to protect heart, and lungs;  and, a Vac-lok. MORE COMPLEX DEVICES MAY BE MADE IN DOSIMETRY FOR FIELD IN FIELD BEAMS FOR DOSE HOMOGENEITY.  I have requested : 3D Simulation which is medically necessary to give adequate dose to at risk tissues while sparing lungs and heart.  I have requested a DVH of the following structures: lungs, heart, lumpectomy cavity.    The patient will receive 50.4 Gy in 28 fractions to the left breast  with 2 tangential fields.  This will be followed by a boost.  Optical Surface Tracking Plan:  Since intensity modulated radiotherapy (IMRT) and 3D conformal radiation treatment methods are predicated on accurate and precise positioning for treatment, intrafraction motion monitoring is medically necessary to ensure accurate and safe treatment delivery. The ability to quantify intrafraction motion without excessive ionizing radiation dose can only be performed with optical surface tracking. Accordingly, surface imaging offers the opportunity to obtain 3D measurements of patient position throughout IMRT and 3D treatments without excessive radiation exposure. I am ordering optical surface tracking for this patient's upcoming course of radiotherapy.  ________________________________   Reference:  Ursula Alert, J, et al. Surface imaging-based analysis of intrafraction motion for breast radiotherapy patients.Journal of [place], n. 6, nov. 2014. ISSN [id number].  Available at: <http://www.jacmp.org/index.php/jacmp/article/view/4957>.    -----------------------------------  Eppie Gibson, MD

## 2016-05-16 DIAGNOSIS — Z510 Encounter for antineoplastic radiation therapy: Secondary | ICD-10-CM | POA: Diagnosis not present

## 2016-05-22 ENCOUNTER — Ambulatory Visit: Payer: Medicaid Other | Admitting: Radiation Oncology

## 2016-05-23 ENCOUNTER — Ambulatory Visit: Payer: Medicaid Other

## 2016-05-23 ENCOUNTER — Ambulatory Visit
Admission: RE | Admit: 2016-05-23 | Discharge: 2016-05-23 | Disposition: A | Discharge status: Home / Self Care | Payer: Medicaid Other | Source: Ambulatory Visit | Attending: Radiation Oncology | Admitting: Radiation Oncology

## 2016-05-23 DIAGNOSIS — Z510 Encounter for antineoplastic radiation therapy: Secondary | ICD-10-CM | POA: Diagnosis not present

## 2016-05-24 ENCOUNTER — Ambulatory Visit: Admission: RE | Admit: 2016-05-24 | Payer: No Typology Code available for payment source | Source: Ambulatory Visit

## 2016-05-24 ENCOUNTER — Ambulatory Visit: Payer: Medicaid Other

## 2016-05-25 ENCOUNTER — Inpatient Hospital Stay: Admission: RE | Admit: 2016-05-25 | Payer: No Typology Code available for payment source | Source: Ambulatory Visit

## 2016-05-25 ENCOUNTER — Ambulatory Visit: Payer: Medicaid Other

## 2016-05-26 ENCOUNTER — Ambulatory Visit: Admission: RE | Admit: 2016-05-26 | Payer: Medicaid Other | Source: Ambulatory Visit

## 2016-05-26 ENCOUNTER — Ambulatory Visit: Admission: RE | Admit: 2016-05-26 | Payer: No Typology Code available for payment source | Source: Ambulatory Visit

## 2016-05-29 ENCOUNTER — Ambulatory Visit
Admission: RE | Admit: 2016-05-29 | Discharge: 2016-05-29 | Disposition: A | Discharge status: Home / Self Care | Payer: Medicaid Other | Source: Ambulatory Visit | Attending: Radiation Oncology | Admitting: Radiation Oncology

## 2016-05-29 ENCOUNTER — Encounter: Payer: Self-pay | Admitting: Radiation Oncology

## 2016-05-29 VITALS — BP 124/84 | HR 87 | Temp 98.4°F

## 2016-05-29 DIAGNOSIS — Z923 Personal history of irradiation: Secondary | ICD-10-CM | POA: Diagnosis not present

## 2016-05-29 DIAGNOSIS — Z170 Estrogen receptor positive status [ER+]: Principal | ICD-10-CM

## 2016-05-29 DIAGNOSIS — C50412 Malignant neoplasm of upper-outer quadrant of left female breast: Secondary | ICD-10-CM | POA: Diagnosis not present

## 2016-05-29 DIAGNOSIS — Z510 Encounter for antineoplastic radiation therapy: Secondary | ICD-10-CM | POA: Diagnosis not present

## 2016-05-29 MED ORDER — ALRA NON-METALLIC DEODORANT (RAD-ONC)
1.0000 "application " | Freq: Once | TOPICAL | Status: AC
  Administered 2016-05-29: 1 via TOPICAL

## 2016-05-29 MED ORDER — RADIAPLEXRX EX GEL
Freq: Once | CUTANEOUS | Status: AC
  Administered 2016-05-29: 17:00:00 via TOPICAL

## 2016-05-29 NOTE — Progress Notes (Signed)
Ms. Milbrath is here for her first fraction of radiation to her Left Breast. She reports pain to her Right Leg which is chronic. She denies any skin changes at this time. She was provided education today, and will begin using Radiaplex twice daily as directed.  BP 124/84   Pulse 87   Temp 98.4 F (36.9 C)   SpO2 98% Comment: room air

## 2016-05-29 NOTE — Progress Notes (Signed)

## 2016-05-29 NOTE — Progress Notes (Signed)
   Weekly Management Note:  outpatient    ICD-9-CM ICD-10-CM   1. Malignant neoplasm of upper-outer quadrant of left breast in female, estrogen receptor positive (HCC) 174.4 C50.412    V86.0 Z17.0     Current Dose:  1.8 Gy  Projected Dose: 60.4 Gy   Narrative:  The patient presents for routine under treatment assessment.  CBCT/MVCT images/Port film x-rays were reviewed.  The chart was checked. No new issues  Physical Findings:  temperature is 98.4 F (36.9 C). Her blood pressure is 124/84 and her pulse is 87. Her oxygen saturation is 98%.   Wt Readings from Last 3 Encounters:  04/17/16 210 lb 9.6 oz (95.5 kg)  02/20/16 212 lb 9.6 oz (96.4 kg)  02/15/16 213 lb 3.2 oz (96.7 kg)   No new skin changes - no drainage from lumpectomy scar  Impression:  The patient is tolerating radiotherapy.  Plan:  Continue radiotherapy as planned. Patient instructed to apply Radiplex to intact skin in treatment fields.      ________________________________   Eppie Gibson, M.D.

## 2016-05-30 ENCOUNTER — Ambulatory Visit
Admission: RE | Admit: 2016-05-30 | Discharge: 2016-05-30 | Disposition: A | Discharge status: Home / Self Care | Payer: Medicaid Other | Source: Ambulatory Visit | Attending: Radiation Oncology | Admitting: Radiation Oncology

## 2016-05-30 DIAGNOSIS — Z510 Encounter for antineoplastic radiation therapy: Secondary | ICD-10-CM | POA: Diagnosis not present

## 2016-05-31 ENCOUNTER — Ambulatory Visit
Admission: RE | Admit: 2016-05-31 | Discharge: 2016-05-31 | Disposition: A | Discharge status: Home / Self Care | Payer: Medicaid Other | Source: Ambulatory Visit | Attending: Radiation Oncology | Admitting: Radiation Oncology

## 2016-05-31 DIAGNOSIS — Z510 Encounter for antineoplastic radiation therapy: Secondary | ICD-10-CM | POA: Diagnosis not present

## 2016-06-01 ENCOUNTER — Ambulatory Visit
Admission: RE | Admit: 2016-06-01 | Discharge: 2016-06-01 | Disposition: A | Discharge status: Home / Self Care | Payer: Medicaid Other | Source: Ambulatory Visit | Attending: Radiation Oncology | Admitting: Radiation Oncology

## 2016-06-01 DIAGNOSIS — Z510 Encounter for antineoplastic radiation therapy: Secondary | ICD-10-CM | POA: Diagnosis not present

## 2016-06-02 ENCOUNTER — Ambulatory Visit
Admission: RE | Admit: 2016-06-02 | Discharge: 2016-06-02 | Disposition: A | Discharge status: Home / Self Care | Payer: Medicaid Other | Source: Ambulatory Visit | Attending: Radiation Oncology | Admitting: Radiation Oncology

## 2016-06-02 DIAGNOSIS — Z510 Encounter for antineoplastic radiation therapy: Secondary | ICD-10-CM | POA: Diagnosis not present

## 2016-06-05 ENCOUNTER — Ambulatory Visit
Admission: RE | Admit: 2016-06-05 | Discharge: 2016-06-05 | Disposition: A | Discharge status: Home / Self Care | Payer: Medicaid Other | Source: Ambulatory Visit | Attending: Radiation Oncology | Admitting: Radiation Oncology

## 2016-06-05 ENCOUNTER — Encounter: Payer: Self-pay | Admitting: Radiation Oncology

## 2016-06-05 VITALS — BP 135/93 | HR 96 | Temp 98.4°F | Ht 68.0 in | Wt 214.8 lb

## 2016-06-05 DIAGNOSIS — Z510 Encounter for antineoplastic radiation therapy: Secondary | ICD-10-CM | POA: Diagnosis not present

## 2016-06-05 DIAGNOSIS — C50412 Malignant neoplasm of upper-outer quadrant of left female breast: Secondary | ICD-10-CM

## 2016-06-05 DIAGNOSIS — Z170 Estrogen receptor positive status [ER+]: Principal | ICD-10-CM

## 2016-06-05 NOTE — Progress Notes (Signed)
[name] is here for her 6th fraction of radiation to her Left Breast. She denies fatigue at this time.  She reports soreness to her lumpectomy site. The soreness is in the area of her incision that "dips in".  The Left Breast is slightly pink to her skin. She is using Radiaplex twice daily as directed.   BP (!) 135/93   Pulse 96   Temp 98.4 F (36.9 C)   Ht 5\' 8"  (1.727 m)   Wt 214 lb 12.8 oz (97.4 kg)   SpO2 99% Comment: room air  BMI 32.66 kg/m    Wt Readings from Last 3 Encounters:  06/05/16 214 lb 12.8 oz (97.4 kg)  04/17/16 210 lb 9.6 oz (95.5 kg)  02/20/16 212 lb 9.6 oz (96.4 kg)

## 2016-06-05 NOTE — Progress Notes (Signed)
   Weekly Management Note:  outpatient    ICD-9-CM ICD-10-CM   1. Malignant neoplasm of upper-outer quadrant of left breast in female, estrogen receptor positive (HCC) 174.4 C50.412    V86.0 Z17.0     Current Dose:  10.8 Gy  Projected Dose: 60.4 Gy   Narrative:  The patient presents for routine under treatment assessment.  CBCT/MVCT images/Port film x-rays were reviewed.  The chart was checked. No new issues; she has some lumpectomy soreness.   Physical Findings:  height is 5\' 8"  (1.727 m) and weight is 214 lb 12.8 oz (97.4 kg). Her temperature is 98.4 F (36.9 C). Her blood pressure is 135/93 (abnormal) and her pulse is 96. Her oxygen saturation is 99%.   Wt Readings from Last 3 Encounters:  06/05/16 214 lb 12.8 oz (97.4 kg)  04/17/16 210 lb 9.6 oz (95.5 kg)  02/20/16 212 lb 9.6 oz (96.4 kg)   Mild left breast erythema - no drainage from lumpectomy scar  Impression:  The patient is tolerating radiotherapy.  Plan:  Continue radiotherapy as planned. Patient instructed to apply Radiplex to intact skin in treatment fields.    ________________________________   Eppie Gibson, M.D.

## 2016-06-06 ENCOUNTER — Ambulatory Visit
Admission: RE | Admit: 2016-06-06 | Discharge: 2016-06-06 | Disposition: A | Discharge status: Home / Self Care | Payer: Medicaid Other | Source: Ambulatory Visit | Attending: Radiation Oncology | Admitting: Radiation Oncology

## 2016-06-06 DIAGNOSIS — Z79899 Other long term (current) drug therapy: Secondary | ICD-10-CM | POA: Insufficient documentation

## 2016-06-06 DIAGNOSIS — Z170 Estrogen receptor positive status [ER+]: Secondary | ICD-10-CM | POA: Insufficient documentation

## 2016-06-06 DIAGNOSIS — Z510 Encounter for antineoplastic radiation therapy: Secondary | ICD-10-CM | POA: Insufficient documentation

## 2016-06-06 DIAGNOSIS — C50412 Malignant neoplasm of upper-outer quadrant of left female breast: Secondary | ICD-10-CM | POA: Insufficient documentation

## 2016-06-07 ENCOUNTER — Ambulatory Visit
Admission: RE | Admit: 2016-06-07 | Discharge: 2016-06-07 | Disposition: A | Discharge status: Home / Self Care | Payer: Medicaid Other | Source: Ambulatory Visit | Attending: Radiation Oncology | Admitting: Radiation Oncology

## 2016-06-07 DIAGNOSIS — Z510 Encounter for antineoplastic radiation therapy: Secondary | ICD-10-CM | POA: Diagnosis not present

## 2016-06-08 ENCOUNTER — Ambulatory Visit
Admission: RE | Admit: 2016-06-08 | Discharge: 2016-06-08 | Disposition: A | Discharge status: Home / Self Care | Payer: Medicaid Other | Source: Ambulatory Visit | Attending: Radiation Oncology | Admitting: Radiation Oncology

## 2016-06-08 DIAGNOSIS — Z510 Encounter for antineoplastic radiation therapy: Secondary | ICD-10-CM | POA: Diagnosis not present

## 2016-06-09 ENCOUNTER — Ambulatory Visit
Admission: RE | Admit: 2016-06-09 | Discharge: 2016-06-09 | Disposition: A | Discharge status: Home / Self Care | Payer: Medicaid Other | Source: Ambulatory Visit | Attending: Radiation Oncology | Admitting: Radiation Oncology

## 2016-06-09 DIAGNOSIS — Z510 Encounter for antineoplastic radiation therapy: Secondary | ICD-10-CM | POA: Diagnosis not present

## 2016-06-12 ENCOUNTER — Encounter: Payer: Self-pay | Admitting: Radiation Oncology

## 2016-06-12 ENCOUNTER — Ambulatory Visit
Admission: RE | Admit: 2016-06-12 | Discharge: 2016-06-12 | Disposition: A | Discharge status: Home / Self Care | Payer: Medicaid Other | Source: Ambulatory Visit | Attending: Radiation Oncology | Admitting: Radiation Oncology

## 2016-06-12 VITALS — BP 108/60 | HR 85 | Temp 98.6°F | Resp 18 | Ht 68.0 in | Wt 216.8 lb

## 2016-06-12 DIAGNOSIS — C50412 Malignant neoplasm of upper-outer quadrant of left female breast: Secondary | ICD-10-CM

## 2016-06-12 DIAGNOSIS — Z170 Estrogen receptor positive status [ER+]: Principal | ICD-10-CM

## 2016-06-12 DIAGNOSIS — Z510 Encounter for antineoplastic radiation therapy: Secondary | ICD-10-CM | POA: Diagnosis not present

## 2016-06-12 NOTE — Progress Notes (Signed)
   Weekly Management Note:  outpatient    ICD-9-CM ICD-10-CM   1. Malignant neoplasm of upper-outer quadrant of left breast in female, estrogen receptor positive (HCC) 174.4 C50.412    V86.0 Z17.0     Current Dose:  19.8 Gy  Projected Dose: 60.4 Gy   Narrative:  The patient presents for routine under treatment assessment.  CBCT/MVCT images/Port film x-rays were reviewed.  The chart was checked. No new issues; she is tired.  Physical Findings:  height is 5\' 8"  (1.727 m) and weight is 216 lb 12.8 oz (98.3 kg). Her oral temperature is 98.6 F (37 C). Her blood pressure is 108/60 and her pulse is 85. Her respiration is 18 and oxygen saturation is 98%.   Wt Readings from Last 3 Encounters:  06/12/16 216 lb 12.8 oz (98.3 kg)  06/05/16 214 lb 12.8 oz (97.4 kg)  04/17/16 210 lb 9.6 oz (95.5 kg)   Moderate left breast erythema - no drainage from lumpectomy scar  Impression:  The patient is tolerating radiotherapy.  Plan:  Continue radiotherapy as planned. Patient instructed to apply Radiplex to intact skin in treatment fields.    ________________________________   Eppie Gibson, M.D.

## 2016-06-12 NOTE — Progress Notes (Signed)
[name] is here for her 11th fraction of radiation to her Left Breast. Having fatigue at this time.  She reports soreness to her lumpectomy site. The soreness is in the area of her incision that "dips in".  The Left Breast is slightly pink to her skin with pain to left breast 3/10.  She is using Radiaplex twice daily as directed.  Wt Readings from Last 3 Encounters:  06/12/16 216 lb 12.8 oz (98.3 kg)  06/05/16 214 lb 12.8 oz (97.4 kg)  04/17/16 210 lb 9.6 oz (95.5 kg)  BP 108/60   Pulse 85   Temp 98.6 F (37 C) (Oral)   Resp 18   Ht 5\' 8"  (1.727 m)   Wt 216 lb 12.8 oz (98.3 kg)   SpO2 98%   BMI 32.96 kg/m

## 2016-06-13 ENCOUNTER — Ambulatory Visit
Admission: RE | Admit: 2016-06-13 | Discharge: 2016-06-13 | Disposition: A | Discharge status: Home / Self Care | Payer: Medicaid Other | Source: Ambulatory Visit | Attending: Radiation Oncology | Admitting: Radiation Oncology

## 2016-06-13 DIAGNOSIS — Z510 Encounter for antineoplastic radiation therapy: Secondary | ICD-10-CM | POA: Diagnosis not present

## 2016-06-14 ENCOUNTER — Ambulatory Visit
Admission: RE | Admit: 2016-06-14 | Discharge: 2016-06-14 | Disposition: A | Discharge status: Home / Self Care | Payer: Medicaid Other | Source: Ambulatory Visit | Attending: Radiation Oncology | Admitting: Radiation Oncology

## 2016-06-14 DIAGNOSIS — Z510 Encounter for antineoplastic radiation therapy: Secondary | ICD-10-CM | POA: Diagnosis not present

## 2016-06-15 ENCOUNTER — Ambulatory Visit
Admission: RE | Admit: 2016-06-15 | Discharge: 2016-06-15 | Disposition: A | Discharge status: Home / Self Care | Payer: Medicaid Other | Source: Ambulatory Visit | Attending: Radiation Oncology | Admitting: Radiation Oncology

## 2016-06-15 DIAGNOSIS — Z510 Encounter for antineoplastic radiation therapy: Secondary | ICD-10-CM | POA: Diagnosis not present

## 2016-06-16 ENCOUNTER — Ambulatory Visit
Admission: RE | Admit: 2016-06-16 | Discharge: 2016-06-16 | Disposition: A | Discharge status: Home / Self Care | Payer: Medicaid Other | Source: Ambulatory Visit | Attending: Radiation Oncology | Admitting: Radiation Oncology

## 2016-06-16 DIAGNOSIS — Z510 Encounter for antineoplastic radiation therapy: Secondary | ICD-10-CM | POA: Diagnosis not present

## 2016-06-19 ENCOUNTER — Encounter: Payer: Self-pay | Admitting: Radiation Oncology

## 2016-06-19 ENCOUNTER — Ambulatory Visit
Admission: RE | Admit: 2016-06-19 | Discharge: 2016-06-19 | Disposition: A | Discharge status: Home / Self Care | Payer: Medicaid Other | Source: Ambulatory Visit | Attending: Radiation Oncology | Admitting: Radiation Oncology

## 2016-06-19 VITALS — BP 126/93 | HR 90 | Temp 99.5°F | Ht 68.0 in | Wt 218.8 lb

## 2016-06-19 DIAGNOSIS — Z170 Estrogen receptor positive status [ER+]: Secondary | ICD-10-CM | POA: Insufficient documentation

## 2016-06-19 DIAGNOSIS — C50412 Malignant neoplasm of upper-outer quadrant of left female breast: Secondary | ICD-10-CM | POA: Diagnosis present

## 2016-06-19 DIAGNOSIS — Z923 Personal history of irradiation: Secondary | ICD-10-CM | POA: Insufficient documentation

## 2016-06-19 DIAGNOSIS — Z510 Encounter for antineoplastic radiation therapy: Secondary | ICD-10-CM | POA: Diagnosis not present

## 2016-06-19 MED ORDER — TRAMADOL HCL 50 MG PO TABS: 50.0000 mg | ORAL_TABLET | Freq: Three times a day (TID) | ORAL | 0 refills | Status: DC | PRN

## 2016-06-19 NOTE — Progress Notes (Signed)
Ms. Ghattas presents for her 16th fraction of radiation to her Left Breast. She reports pain a 4/10 to the underneath of her Left Breast. She reports fatigue. Her Left Breast is red. She has hyperpigmentation to her Left Axilla. She has peeling noted underneath her Left Breast. I have suggested neosporin to begin using to that area. She asks about Eucerin Cream today to help heal that area because it usually will help her skin heal in other areas. I gave her several non-adherent dressings to use to help prevent moisture to the area under her Left Breast.   BP (!) 126/93   Pulse 90   Temp 99.5 F (37.5 C)   Ht 5\' 8"  (1.727 m)   Wt 218 lb 12.8 oz (99.2 kg)   SpO2 98% Comment: room air  BMI 33.27 kg/m    Wt Readings from Last 3 Encounters:  06/19/16 218 lb 12.8 oz (99.2 kg)  06/12/16 216 lb 12.8 oz (98.3 kg)  06/05/16 214 lb 12.8 oz (97.4 kg)

## 2016-06-19 NOTE — Progress Notes (Signed)
    Weekly Management Note:  outpatient    ICD-9-CM ICD-10-CM   1. Malignant neoplasm of upper-outer quadrant of left breast in female, estrogen receptor positive (HCC) 174.4 C50.412    V86.0 Z17.0     Current Dose:   28.8 Gy  Projected Dose: 60.4 Gy   Narrative:  The patient presents for routine under treatment assessment.  CBCT/MVCT images/Port film x-rays were reviewed.  The chart was checked. Pain related to IM fold desquamation.  Physical Findings:  height is 5\' 8"  (1.727 m) and weight is 218 lb 12.8 oz (99.2 kg). Her temperature is 99.5 F (37.5 C). Her blood pressure is 126/93 (abnormal) and her pulse is 90. Her oxygen saturation is 98%.   Wt Readings from Last 3 Encounters:  06/19/16 218 lb 12.8 oz (99.2 kg)  06/12/16 216 lb 12.8 oz (98.3 kg)  06/05/16 214 lb 12.8 oz (97.4 kg)   Moderate left breast erythema - no drainage from lumpectomy scar. Early moist desquamation at IM fold  Impression:  The patient is tolerating radiotherapy.  Plan:  Continue radiotherapy as planned. Patient instructed to apply Radiplex to intact skin in treatment fields. Hydrogel pads given. Apply neosporin to peeling skin instead of radiaplex.  Tramadol Rx for pain.    ________________________________   Eppie Gibson, M.D.

## 2016-06-20 ENCOUNTER — Ambulatory Visit
Admission: RE | Admit: 2016-06-20 | Discharge: 2016-06-20 | Disposition: A | Discharge status: Home / Self Care | Payer: Medicaid Other | Source: Ambulatory Visit | Attending: Radiation Oncology | Admitting: Radiation Oncology

## 2016-06-20 DIAGNOSIS — Z510 Encounter for antineoplastic radiation therapy: Secondary | ICD-10-CM | POA: Diagnosis not present

## 2016-06-21 ENCOUNTER — Ambulatory Visit
Admission: RE | Admit: 2016-06-21 | Discharge: 2016-06-21 | Disposition: A | Discharge status: Home / Self Care | Payer: Medicaid Other | Source: Ambulatory Visit | Attending: Radiation Oncology | Admitting: Radiation Oncology

## 2016-06-21 DIAGNOSIS — Z510 Encounter for antineoplastic radiation therapy: Secondary | ICD-10-CM | POA: Diagnosis not present

## 2016-06-22 ENCOUNTER — Ambulatory Visit
Admission: RE | Admit: 2016-06-22 | Discharge: 2016-06-22 | Disposition: A | Discharge status: Home / Self Care | Payer: Medicaid Other | Source: Ambulatory Visit | Attending: Radiation Oncology | Admitting: Radiation Oncology

## 2016-06-22 DIAGNOSIS — Z510 Encounter for antineoplastic radiation therapy: Secondary | ICD-10-CM | POA: Diagnosis not present

## 2016-06-23 ENCOUNTER — Ambulatory Visit
Admission: RE | Admit: 2016-06-23 | Discharge: 2016-06-23 | Disposition: A | Discharge status: Home / Self Care | Payer: Medicaid Other | Source: Ambulatory Visit | Attending: Radiation Oncology | Admitting: Radiation Oncology

## 2016-06-23 DIAGNOSIS — Z510 Encounter for antineoplastic radiation therapy: Secondary | ICD-10-CM | POA: Diagnosis not present

## 2016-06-24 ENCOUNTER — Ambulatory Visit: Payer: Medicaid Other

## 2016-06-26 ENCOUNTER — Ambulatory Visit
Admission: RE | Admit: 2016-06-26 | Discharge: 2016-06-26 | Disposition: A | Discharge status: Home / Self Care | Payer: Medicaid Other | Source: Ambulatory Visit | Attending: Radiation Oncology | Admitting: Radiation Oncology

## 2016-06-26 DIAGNOSIS — Z510 Encounter for antineoplastic radiation therapy: Secondary | ICD-10-CM | POA: Diagnosis not present

## 2016-06-27 ENCOUNTER — Encounter: Payer: Self-pay | Admitting: Radiation Oncology

## 2016-06-27 ENCOUNTER — Ambulatory Visit
Admission: RE | Admit: 2016-06-27 | Discharge: 2016-06-27 | Disposition: A | Discharge status: Home / Self Care | Payer: Medicaid Other | Source: Ambulatory Visit | Attending: Radiation Oncology | Admitting: Radiation Oncology

## 2016-06-27 VITALS — BP 123/68 | HR 97 | Temp 98.5°F | Ht 68.0 in | Wt 220.0 lb

## 2016-06-27 DIAGNOSIS — Z923 Personal history of irradiation: Secondary | ICD-10-CM | POA: Insufficient documentation

## 2016-06-27 DIAGNOSIS — Z79899 Other long term (current) drug therapy: Secondary | ICD-10-CM | POA: Diagnosis not present

## 2016-06-27 DIAGNOSIS — Z170 Estrogen receptor positive status [ER+]: Secondary | ICD-10-CM | POA: Insufficient documentation

## 2016-06-27 DIAGNOSIS — Z888 Allergy status to other drugs, medicaments and biological substances status: Secondary | ICD-10-CM | POA: Diagnosis not present

## 2016-06-27 DIAGNOSIS — Z7982 Long term (current) use of aspirin: Secondary | ICD-10-CM | POA: Diagnosis not present

## 2016-06-27 DIAGNOSIS — C50412 Malignant neoplasm of upper-outer quadrant of left female breast: Secondary | ICD-10-CM | POA: Insufficient documentation

## 2016-06-27 DIAGNOSIS — Z510 Encounter for antineoplastic radiation therapy: Secondary | ICD-10-CM | POA: Diagnosis not present

## 2016-06-27 DIAGNOSIS — Z880 Allergy status to penicillin: Secondary | ICD-10-CM | POA: Diagnosis not present

## 2016-06-27 MED ORDER — PROCHLORPERAZINE MALEATE 10 MG PO TABS: 10.0000 mg | ORAL_TABLET | Freq: Four times a day (QID) | ORAL | 0 refills | Status: DC | PRN

## 2016-06-27 MED ORDER — BIAFINE EX EMUL
CUTANEOUS | Status: DC | PRN
  Administered 2016-06-27: 17:00:00 via TOPICAL

## 2016-06-27 NOTE — Progress Notes (Signed)
Department of Radiation Oncology  Phone:  [phone] Fax:        [phone]  Weekly Treatment Note    Name: [name] Date: 06/27/2016 MRN: [id number] DOB: 1961/03/01   Diagnosis:     ICD-9-CM ICD-10-CM   1. Malignant neoplasm of upper-outer quadrant of left breast in female, estrogen receptor positive ([place]) 174.4 C50.412 topical emolient (BIAFINE) emulsion   V86.0 Z17.0      Current dose: 39.6 Gy  Current fraction: 22   MEDICATIONS: Current Outpatient Prescriptions  Medication Sig Dispense Refill  . amitriptyline (ELAVIL) 25 MG tablet Take 25 mg by mouth at bedtime.    Marland Kitchen amLODipine (NORVASC) 5 MG tablet Take 5 mg by mouth daily.    Marland Kitchen aspirin-acetaminophen-caffeine (EXCEDRIN MIGRAINE) 250-250-65 MG tablet Take 2 tablets by mouth every 6 (six) hours as needed for headache.    Marland Kitchen atorvastatin (LIPITOR) 20 MG tablet Take 20 mg by mouth daily.    . cholecalciferol (VITAMIN D) 1000 units tablet Take 1,000 Units by mouth daily.    Marland Kitchen HYDROcodone-acetaminophen (NORCO/VICODIN) 5-325 MG tablet Take 1-2 tablets by mouth every 4 (four) hours as needed for moderate pain.    Marland Kitchen lisinopril-hydrochlorothiazide (PRINZIDE,ZESTORETIC) 20-12.5 MG tablet Take 1 tablet by mouth daily.    . meloxicam (MOBIC) 7.5 MG tablet Take 7.5 mg by mouth daily.    . Multiple Vitamins-Minerals (MULTIVITAMIN ADULT PO) Take 1 tablet by mouth daily.     . Probiotic Product (PROBIOTIC DAILY PO) Take 1 capsule by mouth daily.     . traMADol (ULTRAM) 50 MG tablet Take 1 tablet (50 mg total) by mouth 3 (three) times daily as needed. 30 tablet 0  . TURMERIC PO Take 1 tablet by mouth daily.     . prochlorperazine (COMPAZINE) 10 MG tablet Take 1 tablet (10 mg total) by mouth every 6 (six) hours as needed for nausea or vomiting. 30 tablet 0   Current Facility-Administered Medications  Medication Dose Route Frequency Provider Last Rate Last Dose  . topical emolient (BIAFINE) emulsion   Topical PRN Kyung Rudd,  MD         ALLERGIES: Soap; Adhesive [tape]; Augmentin [amoxicillin-pot clavulanate]; Cymbalta [duloxetine hcl]; Latex; Nsaids; Oxycodone; Chlorhexidine gluconate; and Mupirocin   LABORATORY DATA:  Lab Results  Component Value Date   WBC 17.3 (H) 02/25/2016   HGB 12.6 02/25/2016   HCT 37.8 02/25/2016   MCV 91.5 02/25/2016   PLT 303 02/25/2016   Lab Results  Component Value Date   NA 137 02/23/2016   K 4.7 02/23/2016   CL 108 02/23/2016   CO2 25 02/23/2016   Lab Results  Component Value Date   ALT 25 02/20/2016   AST 23 02/20/2016   ALKPHOS 90 02/20/2016   BILITOT 0.8 02/20/2016     NARRATIVE: [name] was seen today for weekly treatment management. The chart was checked and the patient's films were reviewed.  The patient complains of worsening irritation in the breast region. She has been taking some pain medicine for other ailments which does help this but she complains of nausea with the pain medication. She is worried about increasing skin irritation over the last couple of weeks.  [name] is here for her 22nd fraction of radiation to her Left Breast. She reports pain to her Left Axilla and her incision scar site which she rates a 5/10. She has redness to her Left Breast, Left Axilla, and underneath her Breast area. She has peeling noted underneath her Left  Breast. She is using neosporin and hydrogel pads to this area with some relief. She has been using Radiaplex twice daily, and tells me that she does have itching present. I gave her Biafine cream today. She has questions today regarding continuing radiation.   BP 123/68   Pulse 97   Temp 98.5 F (36.9 C)   Ht 5\' 8"  (1.727 m)   Wt 220 lb (99.8 kg)   SpO2 95% Comment: room air  BMI 33.45 kg/m    Wt Readings from Last 3 Encounters:  06/27/16 220 lb (99.8 kg)  06/19/16 218 lb 12.8 oz (99.2 kg)  06/12/16 216 lb 12.8 oz (98.3 kg)    PHYSICAL EXAMINATION: height is 5\' 8"  (1.727 m) and weight is 220 lb  (99.8 kg). Her temperature is 98.5 F (36.9 C). Her blood pressure is 123/68 and her pulse is 97. Her oxygen saturation is 95%.      Hyperpigmentation with erythema present in the treatment area. No moist desquamation.  ASSESSMENT: The patient is doing satisfactorily with treatment. I reassured and encouraged the patient regarding her treatment. She appears quite discouraged today but does agree to continue. I am hopeful that the new skin cream will help. I have also given her a prescription for Compazine which should help with nausea with the pain medication which she currently is using. We did discuss that the final week of treatment will involve a smaller area.  PLAN: We will continue with the patient's radiation treatment as planned.

## 2016-06-27 NOTE — Progress Notes (Signed)
[name] is here for her 22nd fraction of radiation to her Left Breast. She reports pain to her Left Axilla and her incision scar site which she rates a 5/10. She has redness to her Left Breast, Left Axilla, and underneath her Breast area. She has peeling noted underneath her Left Breast. She is using neosporin and hydrogel pads to this area with some relief. She has been using Radiaplex twice daily, and tells me that she does have itching present. I gave her Biafine cream today. She has questions today regarding continuing radiation.   BP 123/68   Pulse 97   Temp 98.5 F (36.9 C)   Ht 5\' 8"  (1.727 m)   Wt 220 lb (99.8 kg)   SpO2 95% Comment: room air  BMI 33.45 kg/m    Wt Readings from Last 3 Encounters:  06/27/16 220 lb (99.8 kg)  06/19/16 218 lb 12.8 oz (99.2 kg)  06/12/16 216 lb 12.8 oz (98.3 kg)

## 2016-06-28 ENCOUNTER — Ambulatory Visit
Admission: RE | Admit: 2016-06-28 | Discharge: 2016-06-28 | Disposition: A | Discharge status: Home / Self Care | Payer: Medicaid Other | Source: Ambulatory Visit | Attending: Radiation Oncology | Admitting: Radiation Oncology

## 2016-06-28 DIAGNOSIS — Z510 Encounter for antineoplastic radiation therapy: Secondary | ICD-10-CM | POA: Diagnosis not present

## 2016-06-29 ENCOUNTER — Ambulatory Visit
Admission: RE | Admit: 2016-06-29 | Discharge: 2016-06-29 | Disposition: A | Discharge status: Home / Self Care | Payer: Medicaid Other | Source: Ambulatory Visit | Attending: Radiation Oncology | Admitting: Radiation Oncology

## 2016-06-29 DIAGNOSIS — Z510 Encounter for antineoplastic radiation therapy: Secondary | ICD-10-CM | POA: Diagnosis not present

## 2016-06-30 ENCOUNTER — Ambulatory Visit
Admission: RE | Admit: 2016-06-30 | Discharge: 2016-06-30 | Disposition: A | Discharge status: Home / Self Care | Payer: Medicaid Other | Source: Ambulatory Visit | Attending: Radiation Oncology | Admitting: Radiation Oncology

## 2016-06-30 DIAGNOSIS — Z510 Encounter for antineoplastic radiation therapy: Secondary | ICD-10-CM | POA: Diagnosis not present

## 2016-07-03 ENCOUNTER — Encounter: Payer: Self-pay | Admitting: Radiation Oncology

## 2016-07-03 ENCOUNTER — Ambulatory Visit
Admission: RE | Admit: 2016-07-03 | Discharge: 2016-07-03 | Disposition: A | Discharge status: Home / Self Care | Payer: Medicaid Other | Source: Ambulatory Visit | Attending: Radiation Oncology | Admitting: Radiation Oncology

## 2016-07-03 ENCOUNTER — Ambulatory Visit: Payer: Medicaid Other

## 2016-07-03 VITALS — BP 172/129 | HR 95 | Temp 98.3°F | Resp 18 | Ht 68.0 in | Wt 214.6 lb

## 2016-07-03 DIAGNOSIS — Z510 Encounter for antineoplastic radiation therapy: Secondary | ICD-10-CM | POA: Diagnosis not present

## 2016-07-03 DIAGNOSIS — Z170 Estrogen receptor positive status [ER+]: Principal | ICD-10-CM

## 2016-07-03 DIAGNOSIS — C50412 Malignant neoplasm of upper-outer quadrant of left female breast: Secondary | ICD-10-CM

## 2016-07-03 MED ORDER — HYDROCODONE-ACETAMINOPHEN 5-325 MG PO TABS: 1.0000 | ORAL_TABLET | ORAL | 0 refills | Status: DC | PRN

## 2016-07-03 NOTE — Progress Notes (Addendum)
[name] is here for her 26nd fraction of radiation to her Left Breast. She reports pain to her Left Axilla and her incision scar site which she rates a 8/10 dry desquamation with raw in the scar area.   She has redness to her Left Breast, Left Axilla, and underneath her Breast area. She has peeling noted underneath her Left Breast. She is using neosporin and hydrogel pads to this area with littlr relief. She has been using Radiaplex twice daily, and tells me that she does have itching present. I gave her Biafine cream has not started to use.   She has questions today regarding continuing radiation.  Blood pressure is elevated has not taken either blood pressure pill in the past 5 days. Wt Readings from Last 3 Encounters:  07/03/16 214 lb 9.6 oz (97.3 kg)  06/27/16 220 lb (99.8 kg)  06/19/16 218 lb 12.8 oz (99.2 kg)  BP (!) 179/120   Pulse 95   Temp 98.3 F (36.8 C) (Oral)   Resp 18   Ht 5\' 8"  (1.727 m)   Wt 214 lb 9.6 oz (97.3 kg)   SpO2 98%   BMI 32.63 kg/m  Right arm BP (!) 172/129   Pulse 95   Temp 98.3 F (36.8 C) (Oral)   Resp 18   Ht 5\' 8"  (1.727 m)   Wt 214 lb 9.6 oz (97.3 kg)   SpO2 98%   BMI 32.63 kg/m  Right arm

## 2016-07-03 NOTE — Progress Notes (Signed)
    Weekly Management Note:  outpatient    ICD-9-CM ICD-10-CM   1. Malignant neoplasm of upper-outer quadrant of left breast in female, estrogen receptor positive (HCC) 174.4 C50.412    V86.0 Z17.0     Current Dose:   46.8 Gy  Projected Dose: 60.4 Gy   Narrative:  The patient presents for routine under treatment assessment.  CBCT/MVCT images/Port film x-rays were reviewed.  The chart was checked. Pain related to lumpectomy/ axillary and IM fold desquamation.  Physical Findings:  height is 5\' 8"  (1.727 m) and weight is 214 lb 9.6 oz (97.3 kg). Her oral temperature is 98.3 F (36.8 C). Her blood pressure is 172/129 (abnormal) and her pulse is 95. Her respiration is 18 and oxygen saturation is 98%.   Wt Readings from Last 3 Encounters:  07/03/16 214 lb 9.6 oz (97.3 kg)  06/27/16 220 lb (99.8 kg)  06/19/16 218 lb 12.8 oz (99.2 kg)   bright left breast erythema -  Moist desquamation at left axilla and at puckered lumpectomy scar. Early moist desquamation at IM fold  Impression:  The patient is tolerating radiotherapy with pain.  Plan:  Continue radiotherapy as planned. Patient instructed to apply Radiplex to intact skin in treatment fields. Hydrogel pads given. Apply neosporin to peeling skin instead of radiaplex.  Tramadol Rx for pain not used - rx returned.  Rx for vicodin given instead.  Pt will get a four day weekend starting 3-1 then get her boost all of next week.   ________________________________   Eppie Gibson, M.D.

## 2016-07-04 ENCOUNTER — Ambulatory Visit: Payer: Medicaid Other

## 2016-07-04 ENCOUNTER — Ambulatory Visit
Admission: RE | Admit: 2016-07-04 | Discharge: 2016-07-04 | Disposition: A | Discharge status: Home / Self Care | Payer: Medicaid Other | Source: Ambulatory Visit | Attending: Radiation Oncology | Admitting: Radiation Oncology

## 2016-07-04 ENCOUNTER — Encounter: Payer: Self-pay | Admitting: Radiation Oncology

## 2016-07-04 DIAGNOSIS — Z510 Encounter for antineoplastic radiation therapy: Secondary | ICD-10-CM | POA: Diagnosis not present

## 2016-07-04 NOTE — Progress Notes (Signed)
Photon Boost Complex Emergency planning/management officer Note  outpatient  Diagnosis: Left Breast Cancer     ICD-9-CM ICD-10-CM   1. Malignant neoplasm of upper-outer quadrant of left breast in female, estrogen receptor positive ([place]) 174.4 C50.412    V86.0 Z17.0    The patient's CT images from her simulation were reviewed to plan her boost treatment to her left breast  lumpectomy cavity.  The boost to the lumpectomy cavity will be delivered with 3 photon fields using MLCs for custom blocks again heart and lungs, with 15 and  6MV photon energy.  This constitutes 3 complex treatment devices. Isodose plan was reviewed and approved. 10 Gy in 5 fractions prescribed.  -----------------------------------  Eppie Gibson, MD

## 2016-07-05 ENCOUNTER — Ambulatory Visit
Admission: RE | Admit: 2016-07-05 | Discharge: 2016-07-05 | Disposition: A | Discharge status: Home / Self Care | Payer: Medicaid Other | Source: Ambulatory Visit | Attending: Radiation Oncology | Admitting: Radiation Oncology

## 2016-07-05 ENCOUNTER — Ambulatory Visit: Payer: Medicaid Other

## 2016-07-05 DIAGNOSIS — Z510 Encounter for antineoplastic radiation therapy: Secondary | ICD-10-CM | POA: Diagnosis not present

## 2016-07-06 ENCOUNTER — Ambulatory Visit: Payer: Medicaid Other

## 2016-07-06 ENCOUNTER — Ambulatory Visit (HOSPITAL_BASED_OUTPATIENT_CLINIC_OR_DEPARTMENT_OTHER): Payer: Medicaid Other | Admitting: Hematology and Oncology

## 2016-07-06 ENCOUNTER — Encounter: Payer: Self-pay | Admitting: Hematology and Oncology

## 2016-07-06 DIAGNOSIS — Z79811 Long term (current) use of aromatase inhibitors: Secondary | ICD-10-CM | POA: Diagnosis not present

## 2016-07-06 DIAGNOSIS — Z170 Estrogen receptor positive status [ER+]: Secondary | ICD-10-CM | POA: Diagnosis not present

## 2016-07-06 DIAGNOSIS — C50412 Malignant neoplasm of upper-outer quadrant of left female breast: Secondary | ICD-10-CM

## 2016-07-06 DIAGNOSIS — L598 Other specified disorders of the skin and subcutaneous tissue related to radiation: Secondary | ICD-10-CM | POA: Diagnosis not present

## 2016-07-06 NOTE — Assessment & Plan Note (Signed)
12/23/2015: Left breast 1.2 cm spiculated mass 2:00 position, 5 mm intramammary lymph node 2:00 position needs biopsy, 6 mm simple cyst at 1:00 position, T1c Nx stage IA 12/29/2015: Left breast biopsy UOQ 2:00 and centimeters from nipple: IDC, grade 1, ER 100%, PR 90%, Ki-67 10%, HER-2 negative ratio 1.16 02/04/2016: Left lumpectomy: IDC grade 1, 1.1 cm, ADH, ALH,0/3 lymph nodes negative, margins negative,ER 100%, PR 90%, HER-2 negative ratio 1.16, Ki-67 10%, T1 CN 0 stage IA Oncotype DX score 13, risk of recurrence 9% with tamoxifen alone Adjuvant radiation therapy: 05/26/2016- 07/06/2016 ----------------------------------------------------------------------------------------------------------------------------------------------------- Treatment plan: Adjuvant antiestrogen therapy with letrozole 2.5 mg daily Letrozole counseling:We discussed the risks and benefits of anti-estrogen therapy with aromatase inhibitors. These include but not limited to insomnia, hot flashes, mood changes, vaginal dryness, bone density loss, and weight gain. We strongly believe that the benefits far outweigh the risks. Patient understands these risks and consented to starting treatment. Planned treatment duration is 5-7 years.  Return to clinic in 3 months for toxicity check and follow-up

## 2016-07-06 NOTE — Progress Notes (Signed)
Patient Care Team: Elbert Ewings, FNP as PCP - General (Nurse Practitioner) [name], MD as Consulting Physician (General Surgery) Nicholas Lose, MD as Consulting Physician (Hematology and Oncology) Eppie Gibson, MD as Attending Physician (Radiation Oncology)  DIAGNOSIS:  Encounter Diagnosis  Name Primary?  . Malignant neoplasm of upper-outer quadrant of left breast in female, estrogen receptor positive ([place])     SUMMARY OF ONCOLOGIC HISTORY:   Breast cancer of upper-outer quadrant of left female breast ([place])   12/23/2015 Mammogram    Left breast 1.2 cm spiculated mass 2:00 position, 5 mm intramammary lymph node 2:00 position needs biopsy, 6 mm simple cyst at 1:00 position, T1c Nx stage IA      12/29/2015 Initial Diagnosis    Left breast biopsy UOQ 2:00 and centimeters from nipple: IDC, grade 1, ER 100%, PR 90%, Ki-67 10%, HER-2 negative ratio 1.16      02/04/2016 Surgery    Left lumpectomy: IDC grade 1, 1.1 cm, ADH, ALH,0/3 lymph nodes negative, margins negative,ER 100%, PR 90%, HER-2 negative ratio 1.16, Ki-67 10%, T1 CN 0 stage IA      02/04/2016 Oncotype testing    Oncotype DX score 13, risk of recurrence 9% with tamoxifen alone      05/26/2016 - 07/06/2016 Radiation Therapy    Adjuvant radiation therapy       CHIEF COMPLIANT: Follow-up on adjuvant radiation  INTERVAL HISTORY: Shelton Soler is a 56 year old with above-mentioned history of left breast cancer treated with lumpectomy and is currently on adjuvant radiation. She has 5 more days of radiation left. She is complaining of radiation dermatitis and discomfort in the breast. She has difficulty elevation of arm where she also has profound fatigue.  REVIEW OF SYSTEMS:   Constitutional: Denies fevers, chills or abnormal weight loss Eyes: Denies blurriness of vision Ears, nose, mouth, throat, and face: Denies mucositis or sore throat Respiratory: Denies cough, dyspnea or wheezes Cardiovascular: Denies  palpitation, chest discomfort Gastrointestinal:  Denies nausea, heartburn or change in bowel habits Skin: Denies abnormal skin rashes Lymphatics: Denies new lymphadenopathy or easy bruising Neurological:Denies numbness, tingling or new weaknesses Behavioral/Psych: Mood is stable, no new changes  Extremities: No lower extremity edema Breast: 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  I have reviewed the past medical history, past surgical history, social history and family history with the patient and they are unchanged from previous note.  ALLERGIES:  is allergic to soap; adhesive [tape]; augmentin [amoxicillin-pot clavulanate]; cymbalta [duloxetine hcl]; latex; nsaids; oxycodone; chlorhexidine gluconate; and mupirocin.  MEDICATIONS:  Current Outpatient Prescriptions  Medication Sig Dispense Refill  . amitriptyline (ELAVIL) 25 MG tablet Take 25 mg by mouth at bedtime.    Marland Kitchen amLODipine (NORVASC) 5 MG tablet Take 5 mg by mouth daily.    Marland Kitchen aspirin-acetaminophen-caffeine (EXCEDRIN MIGRAINE) 250-250-65 MG tablet Take 2 tablets by mouth every 6 (six) hours as needed for headache.    Marland Kitchen atorvastatin (LIPITOR) 20 MG tablet Take 20 mg by mouth daily.    . cholecalciferol (VITAMIN D) 1000 units tablet Take 1,000 Units by mouth daily.    Marland Kitchen HYDROcodone-acetaminophen (NORCO/VICODIN) 5-325 MG tablet Take 1 tablet by mouth every 4 (four) hours as needed for moderate pain. Okay to take 2 tablets before bedtime. 50 tablet 0  . lisinopril-hydrochlorothiazide (PRINZIDE,ZESTORETIC) 20-12.5 MG tablet Take 1 tablet by mouth daily.    . meloxicam (MOBIC) 7.5 MG tablet Take 7.5 mg by mouth daily.    Marland Kitchen  Multiple Vitamins-Minerals (MULTIVITAMIN ADULT PO) Take 1 tablet by mouth daily.     . Probiotic Product (PROBIOTIC DAILY PO) Take 1 capsule by mouth daily.     . prochlorperazine (COMPAZINE) 10 MG tablet Take 1 tablet (10 mg total) by mouth  every 6 (six) hours as needed for nausea or vomiting. (Patient not taking: Reported on 07/03/2016) 30 tablet 0  . traMADol (ULTRAM) 50 MG tablet Take 1 tablet (50 mg total) by mouth 3 (three) times daily as needed. (Patient not taking: Reported on 07/03/2016) 30 tablet 0  . TURMERIC PO Take 1 tablet by mouth daily.      No current facility-administered medications for this visit.     PHYSICAL EXAMINATION: ECOG PERFORMANCE STATUS: 1 - Symptomatic but completely ambulatory  Vitals:   07/06/16 1530  BP: (!) 170/92  Pulse: 97  Resp: 18  Temp: 98.4 F (36.9 C)   Filed Weights   07/06/16 1530  Weight: 217 lb 6.4 oz (98.6 kg)    GENERAL:alert, no distress and comfortable SKIN: skin color, texture, turgor are normal, no rashes or significant lesions EYES: normal, Conjunctiva are pink and non-injected, sclera clear OROPHARYNX:no exudate, no erythema and lips, buccal mucosa, and tongue normal  NECK: supple, thyroid normal size, non-tender, without nodularity LYMPH:  no palpable lymphadenopathy in the cervical, axillary or inguinal LUNGS: clear to auscultation and percussion with normal breathing effort HEART: regular rate & rhythm and no murmurs and no lower extremity edema ABDOMEN:abdomen soft, non-tender and normal bowel sounds MUSCULOSKELETAL:no cyanosis of digits and no clubbing  NEURO: alert & oriented x 3 with fluent speech, no focal motor/sensory deficits EXTREMITIES: No lower extremity edema BREAST:Severe radiation dermatitis in left breast and axilla    Lab Results  Component Value Date   WBC 17.3 (H) 02/25/2016   HGB 12.6 02/25/2016   HCT 37.8 02/25/2016   MCV 91.5 02/25/2016   PLT 303 02/25/2016   NEUTROABS 19.2 (H) 02/20/2016    ASSESSMENT & PLAN:  Breast cancer of upper-outer quadrant of left female breast ([place]) 12/23/2015: Left breast 1.2 cm spiculated mass 2:00 position, 5 mm intramammary lymph node 2:00 position needs biopsy, 6 mm simple cyst at 1:00 position,  T1c Nx stage IA 12/29/2015: Left breast biopsy UOQ 2:00 and centimeters from nipple: IDC, grade 1, ER 100%, PR 90%, Ki-67 10%, HER-2 negative ratio 1.16 02/04/2016: Left lumpectomy: IDC grade 1, 1.1 cm, ADH, ALH,0/3 lymph nodes negative, margins negative,ER 100%, PR 90%, HER-2 negative ratio 1.16, Ki-67 10%, T1 CN 0 stage IA Oncotype DX score 13, risk of recurrence 9% with tamoxifen alone Adjuvant radiation therapy: 05/26/2016- 07/14/2016 ----------------------------------------------------------------------------------------------------------------------------------------------------- Treatment plan: Adjuvant antiestrogen therapy with letrozole 2.5 mg daily to be started 09/14/2016 Letrozole counseling:We discussed the risks and benefits of anti-estrogen therapy with aromatase inhibitors. These include but not limited to insomnia, hot flashes, mood changes, vaginal dryness, bone density loss, and weight gain. We strongly believe that the benefits far outweigh the risks. Patient understands these risks and consented to starting treatment. Planned treatment duration is 5-7 years.  After discussing the risks and benefits of letrozole, patient was not very keen on taking the medication. I provided her with material to read and she will let us know what she decides. If she does decide to try the medication, I will see her 3 months after starting therapy.  Return to clinic in 5 months for toxicity check and follow-up  I spent 25 minutes talking to the patient of which more than half was  spent in counseling and coordination of care.  No orders of the defined types were placed in this encounter.  The patient has a good understanding of the overall plan. she agrees with it. she will call with any problems that may develop before the next visit here.   Rulon Eisenmenger, MD 07/06/16

## 2016-07-07 ENCOUNTER — Ambulatory Visit: Payer: Medicaid Other

## 2016-07-10 ENCOUNTER — Ambulatory Visit
Admission: RE | Admit: 2016-07-10 | Discharge: 2016-07-10 | Disposition: A | Discharge status: Home / Self Care | Payer: Medicaid Other | Source: Ambulatory Visit | Attending: Radiation Oncology | Admitting: Radiation Oncology

## 2016-07-10 ENCOUNTER — Encounter: Payer: Self-pay | Admitting: Radiation Oncology

## 2016-07-10 ENCOUNTER — Ambulatory Visit: Payer: Medicaid Other

## 2016-07-10 VITALS — BP 123/78 | HR 93 | Temp 99.3°F | Ht 68.0 in | Wt 220.2 lb

## 2016-07-10 DIAGNOSIS — Z923 Personal history of irradiation: Secondary | ICD-10-CM | POA: Diagnosis not present

## 2016-07-10 DIAGNOSIS — C50412 Malignant neoplasm of upper-outer quadrant of left female breast: Secondary | ICD-10-CM | POA: Insufficient documentation

## 2016-07-10 DIAGNOSIS — L538 Other specified erythematous conditions: Secondary | ICD-10-CM | POA: Insufficient documentation

## 2016-07-10 DIAGNOSIS — Z170 Estrogen receptor positive status [ER+]: Secondary | ICD-10-CM | POA: Diagnosis not present

## 2016-07-10 DIAGNOSIS — Z510 Encounter for antineoplastic radiation therapy: Secondary | ICD-10-CM | POA: Diagnosis not present

## 2016-07-10 MED ORDER — BIAFINE EX EMUL
CUTANEOUS | Status: DC | PRN
Start: 2016-07-10 — End: 2016-07-11
  Administered 2016-07-10: 17:00:00 via TOPICAL

## 2016-07-10 NOTE — Progress Notes (Signed)
    Weekly Management Note:  outpatient    ICD-9-CM ICD-10-CM   1. Malignant neoplasm of upper-outer quadrant of left breast in female, estrogen receptor positive ([facility]) 174.4 C50.412 topical emolient (BIAFINE) emulsion   V86.0 Z17.0     Current Dose:   52.4 Gy  Projected Dose: 60.4 Gy   Narrative:  The patient presents for routine under treatment assessment.  CBCT/MVCT images/Port film x-rays were reviewed.  The chart was checked. She is doing okay.  Took a 4 day weekend due to pain, skin irritation.  Wants to complete RT this week, as scheduled.  Physical Findings:  height is 5\' 8"  (1.727 m) and weight is 220 lb 3.2 oz (99.9 kg). Her temperature is 99.3 F (37.4 C). Her blood pressure is 123/78 and her pulse is 93. Her oxygen saturation is 97%.   Wt Readings from Last 3 Encounters:  07/10/16 220 lb 3.2 oz (99.9 kg)  07/06/16 217 lb 6.4 oz (98.6 kg)  07/03/16 214 lb 9.6 oz (97.3 kg)   Bright left breast erythema -  Moist desquamation at left axilla and at puckered lumpectomy scar has mostly dried up. Peeling but superficial and dry desquamation at IM fold  Impression:  The patient is tolerating radiotherapy with pain.  Plan:  Continue radiotherapy as planned. Patient instructed to apply Radiplex to intact skin in treatment fields. Hydrogel pads PRN. Apply neosporin to peeling skin instead of radiaplex.  I will see her on her last day of treatment.  Try seeping cool tea bags for skin irritation prn. ________________________________   Eppie Gibson, M.D.

## 2016-07-10 NOTE — Progress Notes (Signed)
[name] is here for her 29th fraction of radiation to her Left Breast. She reports pain a 5-7/10 to her Left Breast. She is taking 1 norco about twice daily with relief. She has some fatigue. Her left breast is red. She has peeling noted around her incision site. She also has peeling noted underneath her Left Breast. She is using neosporin to the areas around her incision. She was given a second tube of Biafine today, and she continues to use it twice daily.   BP 123/78   Pulse 93   Temp 99.3 F (37.4 C)   Ht 5\' 8"  (1.727 m)   Wt 220 lb 3.2 oz (99.9 kg)   SpO2 97%   BMI 33.48 kg/m    Wt Readings from Last 3 Encounters:  07/10/16 220 lb 3.2 oz (99.9 kg)  07/06/16 217 lb 6.4 oz (98.6 kg)  07/03/16 214 lb 9.6 oz (97.3 kg)

## 2016-07-11 ENCOUNTER — Ambulatory Visit: Payer: Medicaid Other

## 2016-07-11 ENCOUNTER — Ambulatory Visit
Admission: RE | Admit: 2016-07-11 | Discharge: 2016-07-11 | Disposition: A | Discharge status: Home / Self Care | Payer: Medicaid Other | Source: Ambulatory Visit | Attending: Radiation Oncology | Admitting: Radiation Oncology

## 2016-07-11 DIAGNOSIS — Z510 Encounter for antineoplastic radiation therapy: Secondary | ICD-10-CM | POA: Diagnosis not present

## 2016-07-12 ENCOUNTER — Ambulatory Visit
Admission: RE | Admit: 2016-07-12 | Discharge: 2016-07-12 | Disposition: A | Discharge status: Home / Self Care | Payer: Medicaid Other | Source: Ambulatory Visit | Attending: Radiation Oncology | Admitting: Radiation Oncology

## 2016-07-12 ENCOUNTER — Ambulatory Visit: Payer: Medicaid Other

## 2016-07-12 DIAGNOSIS — Z510 Encounter for antineoplastic radiation therapy: Secondary | ICD-10-CM | POA: Diagnosis not present

## 2016-07-13 ENCOUNTER — Ambulatory Visit
Admission: RE | Admit: 2016-07-13 | Discharge: 2016-07-13 | Disposition: A | Discharge status: Home / Self Care | Payer: Medicaid Other | Source: Ambulatory Visit | Attending: Radiation Oncology | Admitting: Radiation Oncology

## 2016-07-13 DIAGNOSIS — Z510 Encounter for antineoplastic radiation therapy: Secondary | ICD-10-CM | POA: Diagnosis not present

## 2016-07-14 ENCOUNTER — Encounter: Payer: Self-pay | Admitting: Radiation Oncology

## 2016-07-14 ENCOUNTER — Ambulatory Visit
Admission: RE | Admit: 2016-07-14 | Discharge: 2016-07-14 | Disposition: A | Discharge status: Home / Self Care | Payer: Medicaid Other | Source: Ambulatory Visit | Attending: Radiation Oncology | Admitting: Radiation Oncology

## 2016-07-14 VITALS — BP 133/84 | HR 90 | Temp 98.4°F | Ht 68.0 in | Wt 219.8 lb

## 2016-07-14 DIAGNOSIS — C50412 Malignant neoplasm of upper-outer quadrant of left female breast: Secondary | ICD-10-CM

## 2016-07-14 DIAGNOSIS — Z170 Estrogen receptor positive status [ER+]: Principal | ICD-10-CM

## 2016-07-14 DIAGNOSIS — Z510 Encounter for antineoplastic radiation therapy: Secondary | ICD-10-CM | POA: Diagnosis not present

## 2016-07-14 NOTE — Progress Notes (Signed)
    Weekly Management Note:  outpatient    ICD-9-CM ICD-10-CM   1. Malignant neoplasm of upper-outer quadrant of left breast in female, estrogen receptor positive ([facility]) 174.4 C50.412    V86.0 Z17.0     Current Dose:   60.4 Gy  Projected Dose: 60.4 Gy   Narrative:  The patient presents for routine under treatment assessment.  CBCT/MVCT images/Port film x-rays were reviewed.  The chart was checked. She is doing okay.    Physical Findings:  height is 5\' 8"  (1.727 m) and weight is 219 lb 12.8 oz (99.7 kg). Her temperature is 98.4 F (36.9 C). Her blood pressure is 133/84 and her pulse is 90. Her oxygen saturation is 98%.   Wt Readings from Last 3 Encounters:  07/14/16 219 lb 12.8 oz (99.7 kg)  07/10/16 220 lb 3.2 oz (99.9 kg)  07/06/16 217 lb 6.4 oz (98.6 kg)   Improved bright left breast erythema - improved desquamation at left axilla and at puckered lumpectomy scar - all dried up. Peeling but superficial and dry desquamation at IM fold  Impression:  The patient tolerated radiotherapy with pain.  Plan:  F/u in 62mo. Patient instructed to apply Radiplex to intact skin in treatment fields. Hydrogel pads PRN. Hydrocodone - taper over the next few weeks.  ________________________________   Eppie Gibson, M.D.    This document serves as a record of services personally performed by Eppie Gibson, MD. It was created on her behalf by Bethann Humble, a trained medical scribe. The creation of this record is based on the scribe's personal observations and the provider's statements to them. This document has been checked and approved by the attending provider.

## 2016-07-14 NOTE — Progress Notes (Signed)
[name] presents for her last fraction of radiation to her Left Breast. She reports pain at her incision site a 6-8/10. She reports fatigue and rests in the afternoon at home. Her Left Breast is red. She does have redness to her incision site. There is peeling underneath the incision. She is using biafine and neosporin to her Left Breast.   BP 133/84   Pulse 90   Temp 98.4 F (36.9 C)   Ht 5\' 8"  (1.727 m)   Wt 219 lb 12.8 oz (99.7 kg)   SpO2 98% Comment: room air  BMI 33.42 kg/m    Wt Readings from Last 3 Encounters:  07/14/16 219 lb 12.8 oz (99.7 kg)  07/10/16 220 lb 3.2 oz (99.9 kg)  07/06/16 217 lb 6.4 oz (98.6 kg)

## 2016-07-17 ENCOUNTER — Telehealth: Payer: Self-pay | Admitting: *Deleted

## 2016-07-17 NOTE — Telephone Encounter (Signed)
CALLED PATIENT TO INFORM OF FU APPT. ON 08-18-16 @ 4 PM, SPOKE WITH PATIENT AND SHE IS AWARE OF THIS APPT.

## 2016-07-18 ENCOUNTER — Telehealth: Payer: Self-pay | Admitting: *Deleted

## 2016-07-18 NOTE — Telephone Encounter (Signed)
Called pt to congratulate on completion of xrt. Relate doing well and without complaints. Encourage pt to call with needs. Received verbal understanding.

## 2016-07-21 ENCOUNTER — Encounter: Payer: Self-pay | Admitting: Radiation Oncology

## 2016-07-21 NOTE — Progress Notes (Signed)
  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  The patient experienced some radiation induced skin changes, including erythema and moist desquamation of the left breast and axilla.   Plan: The patient has completed radiation treatment. The patient will apply Radiaplex to intact skin in treatment fields.  Neosporin to peeling skin. She was instructed to use hydrogel pads as needed. Additionally, she was instructed on how to taper Hydrocodone use over several weeks. The patient will return to radiation oncology clinic for routine followup in one month. I advised them to call or return sooner if they have any questions or concerns related to their recovery or treatment.  -----------------------------------  Eppie Gibson, MD  This document serves as a record of services personally performed by Eppie Gibson, MD. It was created on her behalf by Maryla Morrow, a trained medical scribe. The creation of this record is based on the scribe's personal observations and the provider's statements to them. This document has been checked and approved by the attending provider.

## 2016-07-22 ENCOUNTER — Telehealth: Payer: Self-pay | Admitting: Adult Health

## 2016-07-22 NOTE — Telephone Encounter (Signed)
Scheduled SCP. Unable to reach patient, left message and sent letter out

## 2016-07-25 ENCOUNTER — Telehealth: Payer: Self-pay | Admitting: Adult Health

## 2016-07-25 NOTE — Telephone Encounter (Signed)
Patient called to reschedule  5.18.18 appointment time. Rescheduled to an earlier time of day.

## 2016-07-27 ENCOUNTER — Ambulatory Visit: Payer: Medicaid Other | Attending: Orthopaedic Surgery | Admitting: Physical Therapy

## 2016-08-16 ENCOUNTER — Encounter: Payer: Self-pay | Admitting: Radiation Oncology

## 2016-08-18 ENCOUNTER — Ambulatory Visit
Admission: RE | Admit: 2016-08-18 | Discharge: 2016-08-18 | Disposition: A | Discharge status: Home / Self Care | Payer: Medicaid Other | Source: Ambulatory Visit | Attending: Radiation Oncology | Admitting: Radiation Oncology

## 2016-08-18 ENCOUNTER — Encounter: Payer: Self-pay | Admitting: Radiation Oncology

## 2016-08-18 DIAGNOSIS — Z91040 Latex allergy status: Secondary | ICD-10-CM | POA: Diagnosis not present

## 2016-08-18 DIAGNOSIS — Z79899 Other long term (current) drug therapy: Secondary | ICD-10-CM | POA: Insufficient documentation

## 2016-08-18 DIAGNOSIS — Z888 Allergy status to other drugs, medicaments and biological substances status: Secondary | ICD-10-CM | POA: Insufficient documentation

## 2016-08-18 DIAGNOSIS — Z885 Allergy status to narcotic agent status: Secondary | ICD-10-CM | POA: Insufficient documentation

## 2016-08-18 DIAGNOSIS — G8929 Other chronic pain: Secondary | ICD-10-CM | POA: Insufficient documentation

## 2016-08-18 DIAGNOSIS — Z170 Estrogen receptor positive status [ER+]: Secondary | ICD-10-CM | POA: Diagnosis not present

## 2016-08-18 DIAGNOSIS — Z923 Personal history of irradiation: Secondary | ICD-10-CM | POA: Insufficient documentation

## 2016-08-18 DIAGNOSIS — C50412 Malignant neoplasm of upper-outer quadrant of left female breast: Secondary | ICD-10-CM | POA: Diagnosis not present

## 2016-08-18 DIAGNOSIS — M25551 Pain in right hip: Secondary | ICD-10-CM | POA: Diagnosis not present

## 2016-08-18 HISTORY — DX: Personal history of irradiation: Z92.3

## 2016-08-18 NOTE — Progress Notes (Signed)
Radiation Oncology         [phone] ________________________________  Name: [name] MRN: [id number]  Date: 08/18/2016  DOB: Sep 30, 1960  Follow-Up Visit Note  Outpatient  CC: Woody Seller, MD  Elbert Ewings, FNP  Diagnosis and Prior Radiotherapy:    ICD-9-CM ICD-10-CM   1. Malignant neoplasm of upper-outer quadrant of left breast in female, estrogen receptor positive ([place]) 174.4 C50.412    V86.0 Z17.0     Clinical Stage IA T1cNXM0, Pathologic Stage IA T1cN0M0 Left Breast UOQ Inavasive Ductal Carcinoma, ER/PR Positive, HER2 Negative, Grade 1 05/29/16 - 07/14/16 : Left Breast treated to 60.4 Gy in 33 fractions  CHIEF COMPLAINT: Here for follow-up and surveillance of Left Breast cancer  Narrative:  The patient returns today for routine follow-up of radiation completed 07/14/16.  On review of systems, the patient denies pain, other than chronic pain to her right hip and leg. She is seeing an orthopedist next week for follow up. The patient complains of continued peeling to her left breast around the nipple. She is using Eucerin lotion to the area at this time.  She reports she is still undecided about taking Letrozole; her next appointment with Dr. Lindi Adie is 12/06/16. She saw her surgeon, Dr. Lucia Gaskins, yesterday, and reports she will continue to follow with him every 6 months. The patient has an appointment with Survivorship on 09/12/16.                          ALLERGIES:  is allergic to soap; adhesive [tape]; augmentin [amoxicillin-pot clavulanate]; cymbalta [duloxetine hcl]; latex; nsaids; oxycodone; chlorhexidine gluconate; and mupirocin.  Meds: Current Outpatient Prescriptions  Medication Sig Dispense Refill  . amitriptyline (ELAVIL) 25 MG tablet Take 25 mg by mouth at bedtime.    Marland Kitchen amLODipine (NORVASC) 5 MG tablet Take 5 mg by mouth daily.    Marland Kitchen aspirin-acetaminophen-caffeine (EXCEDRIN MIGRAINE) 250-250-65 MG tablet Take 2 tablets by mouth every 6 (six) hours as needed for  headache.    Marland Kitchen atorvastatin (LIPITOR) 20 MG tablet Take 20 mg by mouth daily.    Marland Kitchen lisinopril-hydrochlorothiazide (PRINZIDE,ZESTORETIC) 20-12.5 MG tablet Take 1 tablet by mouth daily.    . meloxicam (MOBIC) 7.5 MG tablet Take 7.5 mg by mouth daily.    . Multiple Vitamins-Minerals (MULTIVITAMIN ADULT PO) Take 1 tablet by mouth daily.     . Probiotic Product (PROBIOTIC DAILY PO) Take 1 capsule by mouth daily.     . TURMERIC PO Take 1 tablet by mouth daily.     . cholecalciferol (VITAMIN D) 1000 units tablet Take 1,000 Units by mouth daily.    . prochlorperazine (COMPAZINE) 10 MG tablet Take 1 tablet (10 mg total) by mouth every 6 (six) hours as needed for nausea or vomiting. (Patient not taking: Reported on 08/18/2016) 30 tablet 0  . tobramycin-dexamethasone (TOBRADEX) ophthalmic solution Aply 1 or 2 drops to the lower right eyelid 4 times a day    . traMADol (ULTRAM) 50 MG tablet Take 1 tablet (50 mg total) by mouth 3 (three) times daily as needed. (Patient not taking: Reported on 08/18/2016) 30 tablet 0   No current facility-administered medications for this encounter.     Physical Findings: The patient is in no acute distress. Patient is alert and oriented.  height is 5' 8"  (1.727 m) and weight is 220 lb 12.8 oz (100.2 kg). Her temperature is 98.9 F (37.2 C). Her blood pressure is 162/105 (abnormal) and her pulse is 93.  Her oxygen saturation is 98%.   Skin is still hyperpigmented in the treatment fields. No moist desquamation. Skin is intact, with some dryness at the nipple.  Lab Findings: Lab Results  Component Value Date   WBC 17.3 (H) 02/25/2016   HGB 12.6 02/25/2016   HCT 37.8 02/25/2016   MCV 91.5 02/25/2016   PLT 303 02/25/2016    Radiographic Findings: No results found.  Impression/Plan:  The patient is recovering from the effects of radiation treatment.  I encouraged the patient to consider trying Letrozole with the understanding that she can always stop taking the  medication if she experiences significant side effects. I also encouraged her to discuss her concerns with Dr. Lindi Adie when she sees him in August.  The patient will continue to follow with medical oncology and surgical oncology as directed. She will continue with routine annual mammograms. She will follow up with me on an as needed basis. I encouraged the patient to contact the clinic with any questions or concerns that may arise.    _____________________________________   Eppie Gibson, MD  This document serves as a record of services personally performed by Eppie Gibson, MD. It was created on her behalf by Maryla Morrow, a trained medical scribe. The creation of this record is based on the scribe's personal observations and the provider's statements to them. This document has been checked and approved by the attending provider.

## 2016-08-18 NOTE — Progress Notes (Signed)
[name] is here for follow up of radiation completed 07/14/16 to her Left Breast. She denies breast pain. She does have chronic pain to her Right hip and Leg. She is seeing an orthopedist this Monday for follow up. She has an appointment with survivorship on 09/12/16. She is still undecided about taking Letrozole and her next appointment with Dr. Lindi Adie is 12/06/16. She saw her surgeon, Dr. Lucia Gaskins yesterday and will follow with him every six months. She reports continued peeling  to her Left Breast around her nipple. She is using eucerin lotion at this time to her skin including her Left Breast.   BP (!) 162/105   Pulse 93   Temp 98.9 F (37.2 C)   Ht 5\' 8"  (1.727 m)   Wt 220 lb 12.8 oz (100.2 kg)   SpO2 98% Comment: room air  BMI 33.57 kg/m    Wt Readings from Last 3 Encounters:  08/18/16 220 lb 12.8 oz (100.2 kg)  07/14/16 219 lb 12.8 oz (99.7 kg)  07/10/16 220 lb 3.2 oz (99.9 kg)

## 2016-08-22 ENCOUNTER — Other Ambulatory Visit: Payer: Self-pay | Admitting: Orthopaedic Surgery

## 2016-08-22 DIAGNOSIS — M25561 Pain in right knee: Secondary | ICD-10-CM

## 2016-09-01 ENCOUNTER — Ambulatory Visit
Admission: RE | Admit: 2016-09-01 | Discharge: 2016-09-01 | Disposition: A | Discharge status: Home / Self Care | Payer: Medicaid Other | Source: Ambulatory Visit | Attending: Orthopaedic Surgery | Admitting: Orthopaedic Surgery

## 2016-09-01 DIAGNOSIS — M25561 Pain in right knee: Secondary | ICD-10-CM

## 2016-09-22 ENCOUNTER — Ambulatory Visit (HOSPITAL_BASED_OUTPATIENT_CLINIC_OR_DEPARTMENT_OTHER): Payer: Medicaid Other | Admitting: Adult Health

## 2016-09-22 ENCOUNTER — Encounter: Payer: Self-pay | Admitting: Adult Health

## 2016-09-22 VITALS — BP 125/83 | HR 98 | Temp 98.9°F | Resp 18 | Ht 68.0 in | Wt 221.4 lb

## 2016-09-22 DIAGNOSIS — C50412 Malignant neoplasm of upper-outer quadrant of left female breast: Secondary | ICD-10-CM | POA: Diagnosis not present

## 2016-09-22 DIAGNOSIS — Z170 Estrogen receptor positive status [ER+]: Secondary | ICD-10-CM

## 2016-09-22 DIAGNOSIS — E2839 Other primary ovarian failure: Secondary | ICD-10-CM

## 2016-09-22 MED ORDER — LETROZOLE 2.5 MG PO TABS: 2.5000 mg | ORAL_TABLET | Freq: Every day | ORAL | 5 refills | Status: DC

## 2016-09-22 NOTE — Progress Notes (Signed)
CLINIC:  Survivorship   REASON FOR VISIT:  Routine follow-up post-treatment for a recent history of breast cancer.  BRIEF ONCOLOGIC HISTORY:    Breast cancer of upper-outer quadrant of left female breast ([place])   12/23/2015 Mammogram    Left breast 1.2 cm spiculated mass 2:00 position, 5 mm intramammary lymph node 2:00 position needs biopsy, 6 mm simple cyst at 1:00 position, T1c Nx stage IA      12/29/2015 Initial Diagnosis    Left breast biopsy UOQ 2:00 and centimeters from nipple: IDC, grade 1, ER 100%, PR 90%, Ki-67 10%, HER-2 negative ratio 1.16      02/04/2016 Surgery    Left lumpectomy: IDC grade 1, 1.1 cm, ADH, ALH,0/3 lymph nodes negative, margins negative,ER 100%, PR 90%, HER-2 negative ratio 1.16, Ki-67 10%, T1 CN 0 stage IA      02/04/2016 Oncotype testing    Oncotype DX score 13, risk of recurrence 9% with tamoxifen alone      05/29/2016 - 07/14/2016 Radiation Therapy    Adjuvant radiation therapy Isidore Moos): 1) Left Breast / 50.4 Gy in 25 fractions.  2) Left Breast Boost / 10 Gy in 5 fractions      07/2016 -  Anti-estrogen oral therapy    Letrozole daily offered       INTERVAL HISTORY:  [name] presents to the [facility] today for our initial meeting to review her survivorship care plan detailing her treatment course for breast cancer, as well as monitoring long-term side effects of that treatment, education regarding health maintenance, screening, and overall wellness and health promotion.     Overall, [name] reports feeling quite well.  She is having arthroscopic surgery on her right knee next week.  She has decided to take the Letrozole offered by Dr. Lindi Adie and wants this sent in.  Her last menses was several years ago.      REVIEW OF SYSTEMS:  Review of Systems  Constitutional: Negative for appetite change, chills, diaphoresis, fatigue, fever and unexpected weight change.  HENT:   Negative for hearing loss and lump/mass.   Eyes: Negative  for eye problems and icterus.  Respiratory: Negative for chest tightness, cough and shortness of breath.   Cardiovascular: Negative for chest pain, leg swelling and palpitations.  Gastrointestinal: Negative for abdominal distention, abdominal pain, constipation and diarrhea.  Endocrine: Negative for hot flashes.  Genitourinary: Negative for difficulty urinating.   Musculoskeletal: Negative for arthralgias and gait problem.  Skin: Negative for itching and rash.  Neurological: Negative for dizziness, extremity weakness, gait problem and light-headedness.  Hematological: Negative for adenopathy. Does not bruise/bleed easily.  Breast: Denies any new nodularity, masses, tenderness, nipple changes, or nipple discharge.      ONCOLOGY TREATMENT TEAM:  1. Surgeon:  Dr.Newman/ at [facility] 2. Medical Oncologist: Dr. Lindi Adie  3. Radiation Oncologist: Dr. Isidore Moos    PAST MEDICAL/SURGICAL HISTORY:  Past Medical History:  Diagnosis Date  . Anxiety   . Arthritis    lower back  . Breast cancer of upper-outer quadrant of left female breast ([place]) 01/03/2016  . Complication of anesthesia    hx. of combativeness as child; had severe migraine headache day after back surgery  . Cough due to ACE inhibitor 01/28/2016  . Depression   . Eczema    right hip  . High cholesterol   . History of radiation therapy 05/29/16- 07/14/16   Left Breast/ 50.4 Gy n 25 fractions, Left Breast Boost/ 10 Gy in 5 fractions.   Marland Kitchen  Hypertension    states under control with meds., has been on med. since age 80  . Lymphocytic colitis   . Migraines   . PONV (postoperative nausea and vomiting)   . Sciatica   . Sinus drainage 01/28/2016  . Urinary urgency    Past Surgical History:  Procedure Laterality Date  . BREAST LUMPECTOMY WITH NEEDLE LOCALIZATION AND AXILLARY SENTINEL LYMPH NODE BX Left 02/04/2016   Procedure: LEFT BREAST LUMPECTOMY WITH 2 NEEDLE LOCALIZATIONS AND LEFT AXILLARY SENTINEL LYMPH NODE BIOPSY;   Surgeon: [name], MD;  Location: [facility];  Service: General;  Laterality: Left;  LEFT BREAST LUMPECTOMY WITH 2 NEEDLE LOCALIZATIONS AND LEFT AXILLARY SENTINEL LYMPH NODE BIOPSY  . [place]  . COLONOSCOPY     Hx: of  . CYSTO  09/29/2005   with anterior repair  . ENDOMETRIAL ABLATION  09/29/2005  . INCISION AND DRAINAGE ABSCESS Left 02/21/2016   Procedure: INCISION AND DRAINAGE ABSCESS;  Surgeon: Excell Seltzer, MD;  Location: [facility];  Service: General;  Laterality: Left;  . INCONTINENCE SURGERY  09/29/2005  . LUMBAR FUSION  04/28/2013   L3-4  . TONSILLECTOMY     age 86  . TUBAL LIGATION    . WISDOM TOOTH EXTRACTION     as a teenager     ALLERGIES:  Allergies  Allergen Reactions  . Soap Shortness Of Breath and Rash    IVORY SOAP  . Adhesive [Tape] Other (See Comments)    BLISTERS  . Augmentin [Amoxicillin-Pot Clavulanate] Nausea And Vomiting    Has patient had a PCN reaction causing immediate rash, facial/tongue/throat swelling, SOB or lightheadedness with hypotension: Unknown Has patient had a PCN reaction causing severe rash involving mucus membranes or skin necrosis: Unknown Has patient had a PCN reaction that required hospitalization Unknown Has patient had a PCN reaction occurring within the last 10 years: Unknown If all of the above answers are "NO", then may proceed with Cephalosporin use.   Marland Kitchen Cymbalta [Duloxetine Hcl] Nausea Only  . Latex Other (See Comments) and Itching    REACTION: blisters BLISTERS  . Nsaids Other (See Comments)    LYMPHOCYTIC COLITIS  . Oxycodone Nausea Only    Not on an empty stomach  . Chlorhexidine Gluconate Rash    Pt says her skin looked like poison ivy  . Mupirocin Rash     CURRENT MEDICATIONS:  Outpatient Encounter Prescriptions as of 09/22/2016  Medication Sig  . amitriptyline (ELAVIL) 25 MG tablet Take 25 mg by mouth at bedtime.  Marland Kitchen amLODipine (NORVASC) 5 MG tablet Take 5 mg by mouth  daily.  Marland Kitchen aspirin-acetaminophen-caffeine (EXCEDRIN MIGRAINE) 250-250-65 MG tablet Take 2 tablets by mouth every 6 (six) hours as needed for headache.  Marland Kitchen atorvastatin (LIPITOR) 20 MG tablet Take 20 mg by mouth daily.  . cholecalciferol (VITAMIN D) 1000 units tablet Take 1,000 Units by mouth daily.  Marland Kitchen lisinopril-hydrochlorothiazide (PRINZIDE,ZESTORETIC) 20-12.5 MG tablet Take 1 tablet by mouth daily.  . meloxicam (MOBIC) 7.5 MG tablet Take 7.5 mg by mouth daily.  . Multiple Vitamins-Minerals (MULTIVITAMIN ADULT PO) Take 1 tablet by mouth daily.   . Probiotic Product (PROBIOTIC DAILY PO) Take 1 capsule by mouth daily.   . prochlorperazine (COMPAZINE) 10 MG tablet Take 1 tablet (10 mg total) by mouth every 6 (six) hours as needed for nausea or vomiting. (Patient not taking: Reported on 08/18/2016)  . tobramycin-dexamethasone (TOBRADEX) ophthalmic solution Aply 1 or 2 drops to the lower right eyelid 4  times a day  . traMADol (ULTRAM) 50 MG tablet Take 1 tablet (50 mg total) by mouth 3 (three) times daily as needed. (Patient not taking: Reported on 08/18/2016)  . TURMERIC PO Take 1 tablet by mouth daily.    No facility-administered encounter medications on file as of 09/22/2016.      ONCOLOGIC FAMILY HISTORY:  Family History  Problem Relation Age of Onset  . Colon cancer Father        64-65  . Hypertension Father   . Colon polyps Unknown   . Diabetes Unknown   . Breast cancer Paternal Aunt      GENETIC COUNSELING/TESTING: Not indicated at this time  SOCIAL HISTORY:  [name] is divorced and lives with her daughter in [place].  She has 2 children and they live in [place] and [place].  Ms. Summerhill is currently working [employer].  She denies any current or history of tobacco, alcohol, or illicit drug use.     PHYSICAL EXAMINATION:  Vital Signs:   Vitals:   09/22/16 0842  BP: 125/83  Pulse: 98  Resp: 18  Temp: 98.9 F (37.2 C)   Filed Weights    09/22/16 0842  Weight: 221 lb 6.4 oz (100.4 kg)   General: Well-nourished, well-appearing female in no acute distress.  She is unaccompanied today.   HEENT: Head is normocephalic.  Pupils equal and reactive to light. Conjunctivae clear without exudate.  Sclerae anicteric. Oral mucosa is pink, moist.  Oropharynx is pink without lesions or erythema.  Lymph: No cervical, supraclavicular, or infraclavicular lymphadenopathy noted on palpation.  Cardiovascular: Regular rate and rhythm.Marland Kitchen Respiratory: Clear to auscultation bilaterally. Chest expansion symmetric; breathing non-labored.  GI: Abdomen soft and round; non-tender, non-distended. Bowel sounds normoactive.  GU: Deferred.  Neuro: No focal deficits. Steady gait.  Psych: Mood and affect normal and appropriate for situation.  Extremities: No edema. Skin: Warm and dry.  LABORATORY DATA:  None for this visit.  DIAGNOSTIC IMAGING:  None for this visit.      ASSESSMENT AND PLAN:  Ms.. Barnes is a pleasant 56 y.o. female with Stage IA left breast invasive ductal carcinoma, ER+/PR+/HER2-, diagnosed in 12/2015, treated with lumpectomy, adjuvant radiation therapy, and anti-estrogen therapy with Letrozole that she will soon start taking.  She presents to the Survivorship Clinic for our initial meeting and routine follow-up post-completion of treatment for breast cancer.    1. Stage IA left breast cancer:  [name] is continuing to recover from definitive treatment for breast cancer. She will follow-up with her medical oncologist, Dr. Lindi Adie in August, 2018 with history and physical exam per surveillance protocol.  She has decided to start Letrozole daily.  I sent in a 30 day supply at her request today.  We reviewed in detail Letrozole and possible adverse effects.    Today, a comprehensive survivorship care plan and treatment summary was reviewed with the patient today detailing her breast cancer diagnosis, treatment course, potential  late/long-term effects of treatment, appropriate follow-up care with recommendations for the future, and patient education resources.  A copy of this summary, along with a letter will be sent to the patient's primary care provider via mail/fax/In Basket message after today's visit.    2. Bone health:  Given [name]'s age/history of breast cancer and her current treatment regimen including anti-estrogen therapy with Letrozole, she is at risk for bone demineralization.  She will need a DEXA scan, as she has never had one.  In the meantime,  she was encouraged to increase her consumption of foods rich in calcium, as well as increase her weight-bearing activities.  She was given education on specific activities to promote bone health.  3. Cancer screening:  Due to [name]'s history and her age, she should receive screening for skin cancers, colon cancer, and gynecologic cancers.  The information and recommendations are listed on the patient's comprehensive care plan/treatment summary and were reviewed in detail with the patient.    4. Health maintenance and wellness promotion: [name] was encouraged to consume 5-7 servings of fruits and vegetables per day. We reviewed the "Nutrition Rainbow" handout, as well as the handout "Take Control of Your Health and Reduce Your Cancer Risk" from the [facility].  She was also encouraged to engage in moderate to vigorous exercise for 30 minutes per day most days of the week. We discussed the [employer] fitness program, which is designed for cancer survivors to help them become more physically fit after cancer treatments.  She was instructed to limit her alcohol consumption and to abstain from tobacco use.     5. Support services/counseling: It is not uncommon for this period of the patient's cancer care trajectory to be one of many emotions and stressors.  We discussed an opportunity for her to participate in the next session of [facility]  ("Finding Your New Normal") support group series designed for patients after they have completed treatment.   [name] was encouraged to take advantage of our many other support services programs, support groups, and/or counseling in coping with her new life as a cancer survivor after completing anti-cancer treatment.  She was offered support today through active listening and expressive supportive counseling.  She was given information regarding our available services and encouraged to contact me with any questions or for help enrolling in any of our support group/programs.    Dispo:   -Return to cancer center in August, 2018 for follow up with Dr. Lindi Adie -She is welcome to return back to the Survivorship Clinic at any time; no additional follow-up needed at this time.  -Consider referral back to survivorship as a long-term survivor for continued surveillance  A total of (30) minutes of face-to-face time was spent with this patient with greater than 50% of that time in counseling and care-coordination.   Gardenia Phlegm, [place] [phone]   Note: PRIMARY CARE PROVIDER [name], [place] [phone]

## 2016-10-30 ENCOUNTER — Ambulatory Visit
Admission: RE | Admit: 2016-10-30 | Discharge: 2016-10-30 | Disposition: A | Discharge status: Home / Self Care | Payer: Medicaid Other | Source: Ambulatory Visit | Attending: Adult Health | Admitting: Adult Health

## 2016-10-30 DIAGNOSIS — E2839 Other primary ovarian failure: Secondary | ICD-10-CM

## 2016-10-31 ENCOUNTER — Telehealth: Payer: Self-pay | Admitting: *Deleted

## 2016-10-31 NOTE — Telephone Encounter (Signed)
Called pt to inform her of bone density results which were normal and will fax a copy to her PCP as requested by pt. Message to be fwd to [employer].

## 2016-12-06 ENCOUNTER — Encounter: Payer: Self-pay | Admitting: Hematology and Oncology

## 2016-12-06 ENCOUNTER — Ambulatory Visit (HOSPITAL_BASED_OUTPATIENT_CLINIC_OR_DEPARTMENT_OTHER): Payer: Medicaid Other | Admitting: Hematology and Oncology

## 2016-12-06 VITALS — BP 142/90 | HR 89 | Temp 98.9°F | Resp 18 | Ht 68.0 in | Wt 230.2 lb

## 2016-12-06 DIAGNOSIS — N951 Menopausal and female climacteric states: Secondary | ICD-10-CM

## 2016-12-06 DIAGNOSIS — Z79811 Long term (current) use of aromatase inhibitors: Secondary | ICD-10-CM

## 2016-12-06 DIAGNOSIS — R252 Cramp and spasm: Secondary | ICD-10-CM

## 2016-12-06 DIAGNOSIS — Z170 Estrogen receptor positive status [ER+]: Secondary | ICD-10-CM | POA: Diagnosis not present

## 2016-12-06 DIAGNOSIS — C50412 Malignant neoplasm of upper-outer quadrant of left female breast: Secondary | ICD-10-CM

## 2016-12-06 MED ORDER — METHOCARBAMOL 500 MG PO TABS: 500.0000 mg | ORAL_TABLET | Freq: Every day | ORAL | Status: DC

## 2016-12-06 NOTE — Assessment & Plan Note (Signed)
12/23/2015: Left breast 1.2 cm spiculated mass 2:00 position, 5 mm intramammary lymph node 2:00 position needs biopsy, 6 mm simple cyst at 1:00 position, T1c Nx stage IA 12/29/2015: Left breast biopsy UOQ 2:00 and centimeters from nipple: IDC, grade 1, ER 100%, PR 90%, Ki-67 10%, HER-2 negative ratio 1.16 02/04/2016: Left lumpectomy: IDC grade 1, 1.1 cm, ADH, ALH,0/3 lymph nodes negative, margins negative,ER 100%, PR 90%, HER-2 negative ratio 1.16, Ki-67 10%, T1 CN 0 stage IA Oncotype DX score 13, risk of recurrence 9% with tamoxifen alone Adjuvant radiation therapy: 05/26/2016- 07/06/2016 ----------------------------------------------------------------------------------------------------------------------------------------------------- Treatment plan: Adjuvant antiestrogen therapy with letrozole 2.5 mg daily started May 2018 Letrozole toxicities: 1. Hot flashes 2. intermittent muscle cramps especially in the left shoulder  Left shoulder range of motion issues: I will consult physical therapy  Return to clinic in 6 months for toxicity check and follow-up

## 2016-12-06 NOTE — Progress Notes (Signed)
Patient Care Team: [name], MD as PCP - General (Family Medicine) [name], MD as Consulting Physician (General Surgery) Nicholas Lose, MD as Consulting Physician (Hematology and Oncology) Eppie Gibson, MD as Attending Physician (Radiation Oncology) Gardenia Phlegm, NP as Nurse Practitioner (Hematology and Oncology)  DIAGNOSIS:  Encounter Diagnosis  Name Primary?  . Malignant neoplasm of upper-outer quadrant of left breast in female, estrogen receptor positive ([place]) Yes    SUMMARY OF ONCOLOGIC HISTORY:   Breast cancer of upper-outer quadrant of left female breast ([place])   12/23/2015 Mammogram    Left breast 1.2 cm spiculated mass 2:00 position, 5 mm intramammary lymph node 2:00 position needs biopsy, 6 mm simple cyst at 1:00 position, T1c Nx stage IA      12/29/2015 Initial Diagnosis    Left breast biopsy UOQ 2:00 and centimeters from nipple: IDC, grade 1, ER 100%, PR 90%, Ki-67 10%, HER-2 negative ratio 1.16      02/04/2016 Surgery    Left lumpectomy: IDC grade 1, 1.1 cm, ADH, ALH,0/3 lymph nodes negative, margins negative,ER 100%, PR 90%, HER-2 negative ratio 1.16, Ki-67 10%, T1 CN 0 stage IA      02/04/2016 Oncotype testing    Oncotype DX score 13, risk of recurrence 9% with tamoxifen alone      05/29/2016 - 07/14/2016 Radiation Therapy    Adjuvant radiation therapy Isidore Moos): 1) Left Breast / 50.4 Gy in 25 fractions.  2) Left Breast Boost / 10 Gy in 5 fractions      09/2016 -  Anti-estrogen oral therapy    Letrozole daily       CHIEF COMPLIANT: Follow-up on letrozole therapy  INTERVAL HISTORY: [name] is a 56 year old with above-mentioned history of left breast cancer currently on adjuvant antiestrogen therapy with letrozole. She complains of hot flashes as well as left shoulder pain and discomfort. She's been having range of motion shows with a past 1-2 weeks. Otherwise she is tolerating letrozole fairly well. She does have intermittent hot  flashes. These hot flashes are bearable and manageable.  REVIEW OF SYSTEMS:   Constitutional: Denies fevers, chills or abnormal weight loss Eyes: Denies blurriness of vision Ears, nose, mouth, throat, and face: Denies mucositis or sore throat Respiratory: Denies cough, dyspnea or wheezes Cardiovascular: Denies palpitation, chest discomfort Gastrointestinal:  Denies nausea, heartburn or change in bowel habits Skin: Denies abnormal skin rashes Lymphatics: Denies new lymphadenopathy or easy bruising Neurological:Denies numbness, tingling or new weaknesses Behavioral/Psych: Mood is stable, no new changes  Extremities: Left shoulder decreased range of motion Breast: Left chest wall pain and decreased range of motion in the left shoulder   I have reviewed the past medical history, past surgical history, social history and family history with the patient and they are unchanged from previous note.  ALLERGIES:  is allergic to soap; adhesive [tape]; augmentin [amoxicillin-pot clavulanate]; cymbalta [duloxetine hcl]; latex; nsaids; oxycodone; chlorhexidine gluconate; and mupirocin.  MEDICATIONS:  Current Outpatient Prescriptions  Medication Sig Dispense Refill  . amitriptyline (ELAVIL) 25 MG tablet Take 25 mg by mouth at bedtime.    Marland Kitchen amLODipine (NORVASC) 5 MG tablet Take 5 mg by mouth daily.    Marland Kitchen aspirin-acetaminophen-caffeine (EXCEDRIN MIGRAINE) 250-250-65 MG tablet Take 2 tablets by mouth every 6 (six) hours as needed for headache.    Marland Kitchen atorvastatin (LIPITOR) 20 MG tablet Take 20 mg by mouth daily.    . cholecalciferol (VITAMIN D) 1000 units tablet Take 1,000 Units by mouth daily.    Marland Kitchen letrozole (  FEMARA) 2.5 MG tablet Take 1 tablet (2.5 mg total) by mouth daily. 30 tablet 5  . lisinopril-hydrochlorothiazide (PRINZIDE,ZESTORETIC) 20-12.5 MG tablet Take 1 tablet by mouth daily.    . methocarbamol (ROBAXIN) 500 MG tablet Take 1 tablet (500 mg total) by mouth daily.    . Multiple Vitamins-Minerals  (MULTIVITAMIN ADULT PO) Take 1 tablet by mouth daily.     . Probiotic Product (PROBIOTIC DAILY PO) Take 1 capsule by mouth daily.      No current facility-administered medications for this visit.     PHYSICAL EXAMINATION: ECOG PERFORMANCE STATUS: 1 - Symptomatic but completely ambulatory  Vitals:   12/06/16 0842  BP: (!) 142/90  Pulse: 89  Resp: 18  Temp: 98.9 F (37.2 C)   Filed Weights   12/06/16 0842  Weight: 230 lb 3.2 oz (104.4 kg)    GENERAL:alert, no distress and comfortable SKIN: skin color, texture, turgor are normal, no rashes or significant lesions EYES: normal, Conjunctiva are pink and non-injected, sclera clear OROPHARYNX:no exudate, no erythema and lips, buccal mucosa, and tongue normal  NECK: supple, thyroid normal size, non-tender, without nodularity LYMPH:  no palpable lymphadenopathy in the cervical, axillary or inguinal LUNGS: clear to auscultation and percussion with normal breathing effort HEART: regular rate & rhythm and no murmurs and no lower extremity edema ABDOMEN:abdomen soft, non-tender and normal bowel sounds MUSCULOSKELETAL:no cyanosis of digits and no clubbing  NEURO: alert & oriented x 3 with fluent speech, no focal motor/sensory deficits EXTREMITIES: No lower extremity edema  LABORATORY DATA:  I have reviewed the data as listed   Chemistry      Component Value Date/Time   NA 137 02/23/2016 0312   K 4.7 02/23/2016 0312   CL 108 02/23/2016 0312   CO2 25 02/23/2016 0312   BUN 14 02/23/2016 0312   CREATININE 0.86 02/23/2016 0312      Component Value Date/Time   CALCIUM 8.6 (L) 02/23/2016 0312   ALKPHOS 90 02/20/2016 1630   AST 23 02/20/2016 1630   ALT 25 02/20/2016 1630   BILITOT 0.8 02/20/2016 1630       Lab Results  Component Value Date   WBC 17.3 (H) 02/25/2016   HGB 12.6 02/25/2016   HCT 37.8 02/25/2016   MCV 91.5 02/25/2016   PLT 303 02/25/2016   NEUTROABS 19.2 (H) 02/20/2016    ASSESSMENT & PLAN:  Breast cancer of  upper-outer quadrant of left female breast ([place]) 12/23/2015: Left breast 1.2 cm spiculated mass 2:00 position, 5 mm intramammary lymph node 2:00 position needs biopsy, 6 mm simple cyst at 1:00 position, T1c Nx stage IA 12/29/2015: Left breast biopsy UOQ 2:00 and centimeters from nipple: IDC, grade 1, ER 100%, PR 90%, Ki-67 10%, HER-2 negative ratio 1.16 02/04/2016: Left lumpectomy: IDC grade 1, 1.1 cm, ADH, ALH,0/3 lymph nodes negative, margins negative,ER 100%, PR 90%, HER-2 negative ratio 1.16, Ki-67 10%, T1 CN 0 stage IA Oncotype DX score 13, risk of recurrence 9% with tamoxifen alone Adjuvant radiation therapy: 05/26/2016- 07/06/2016 ----------------------------------------------------------------------------------------------------------------------------------------------------- Treatment plan: Adjuvant antiestrogen therapy with letrozole 2.5 mg daily started May 2018 Letrozole toxicities: 1. Hot flashes 2. intermittent muscle cramps especially in the left shoulder  Left shoulder range of motion issues: I will consult physical therapy  Return to clinic in 6 months for toxicity check and follow-up      I spent 25 minutes talking to the patient of which more than half was spent in counseling and coordination of care.  Orders Placed This Encounter  Procedures  . MM DIAG BREAST TOMO BILATERAL    Standing Status:   Future    Standing Expiration Date:   12/06/2017    Order Specific Question:   Reason for Exam (SYMPTOM  OR DIAGNOSIS REQUIRED)    Answer:   Annual mammograms    Order Specific Question:   Is the patient pregnant?    Answer:   No    Order Specific Question:   Preferred imaging location?    Answer:   [facility]  . Ambulatory referral to Physical Therapy    Referral Priority:   Routine    Referral Type:   Physical Medicine    Referral Reason:   Specialty Services Required    Requested Specialty:   Physical Therapy    Number of Visits Requested:   1   The patient  has a good understanding of the overall plan. she agrees with it. she will call with any problems that may develop before the next visit here.   Rulon Eisenmenger, MD 12/06/16

## 2017-01-22 ENCOUNTER — Ambulatory Visit
Admission: RE | Admit: 2017-01-22 | Discharge: 2017-01-22 | Disposition: A | Discharge status: Home / Self Care | Payer: Medicaid Other | Source: Ambulatory Visit | Attending: Hematology and Oncology | Admitting: Hematology and Oncology

## 2017-01-22 DIAGNOSIS — Z170 Estrogen receptor positive status [ER+]: Principal | ICD-10-CM

## 2017-01-22 DIAGNOSIS — C50412 Malignant neoplasm of upper-outer quadrant of left female breast: Secondary | ICD-10-CM

## 2017-01-22 HISTORY — DX: Personal history of irradiation: Z92.3

## 2017-01-22 HISTORY — DX: Malignant neoplasm of unspecified site of unspecified female breast: C50.919

## 2017-04-04 ENCOUNTER — Encounter: Payer: Self-pay | Admitting: Gastroenterology

## 2017-04-04 ENCOUNTER — Ambulatory Visit: Payer: Medicaid Other | Admitting: Gastroenterology

## 2017-04-04 VITALS — BP 140/86 | HR 82 | Ht 68.0 in | Wt 233.0 lb

## 2017-04-04 DIAGNOSIS — Z800 Family history of malignant neoplasm of digestive organs: Secondary | ICD-10-CM

## 2017-04-04 DIAGNOSIS — K52832 Lymphocytic colitis: Secondary | ICD-10-CM

## 2017-04-04 MED ORDER — NA SULFATE-K SULFATE-MG SULF 17.5-3.13-1.6 GM/177ML PO SOLN: ORAL | 0 refills | Status: DC

## 2017-04-04 MED ORDER — BUDESONIDE ER 9 MG PO TB24: 9.0000 mg | ORAL_TABLET | Freq: Every day | ORAL | 2 refills | Status: DC

## 2017-04-04 NOTE — Progress Notes (Signed)
Reviewed and agree with management plan.  Tynetta Bachmann T. Taina Landry, MD FACG 

## 2017-04-04 NOTE — Patient Instructions (Signed)
If you are age 55 or older, your body mass index should be between 23-30. Your Body mass index is 35.43 kg/m. If this is out of the aforementioned range listed, please consider follow up with your Primary Care Provider.  If you are age 59 or younger, your body mass index should be between 19-25. Your Body mass index is 35.43 kg/m. If this is out of the aformentioned range listed, please consider follow up with your Primary Care Provider.   You have been scheduled for a colonoscopy. Please follow written instructions given to you at your visit today.  Please pick up your prep supplies at the pharmacy within the next 1-3 days. If you use inhalers (even only as needed), please bring them with you on the day of your procedure. Your physician has requested that you go to www.startemmi.com and enter the access code given to you at your visit today. This web site gives a general overview about your procedure. However, you should still follow specific instructions given to you by our office regarding your preparation for the procedure.  We have sent the following medications to your pharmacy for you to pick up at your convenience: Suprep Uceris 9 mg  Thank you for choosing me and [facility].   Alonza Bogus, PA-C

## 2017-04-04 NOTE — Progress Notes (Signed)
04/04/2017 [name] [id number] 1961/02/11   HISTORY OF PRESENT ILLNESS: This is a pleasant 56 year old female who is here today for the first time in 4 years.  She is known to Dr. Fuller Plan.  She has a history of lymphocytic colitis and had previously been treated successfully with Entocort.  She did not have insurance for quite some time.  She is here today again with complaints of watery diarrhea several times per day about 15-20 minutes after eating.  There is some associated abdominal pain such as cramping when she has the diarrhea, but denies any rectal bleeding.  She does have a family history of colon cancer in her father.  Her last colonoscopy was in 2009 at which time she was found to have a normal colonoscopy except for internal hemorrhoids, but biopsies confirmed lymphocytic colitis.   Past Medical History:  Diagnosis Date  . Anxiety   . Arthritis    lower back  . Breast cancer ([place])   . Breast cancer of upper-outer quadrant of left female breast ([place]) 01/03/2016  . Complication of anesthesia    hx. of combativeness as child; had severe migraine headache day after back surgery  . Cough due to ACE inhibitor 01/28/2016  . Depression   . Eczema    right hip  . High cholesterol   . History of radiation therapy 05/29/16- 07/14/16   Left Breast/ 50.4 Gy n 25 fractions, Left Breast Boost/ 10 Gy in 5 fractions.   . Hypertension    states under control with meds., has been on med. since age 47  . Lymphocytic colitis   . Migraines   . Personal history of radiation therapy   . PONV (postoperative nausea and vomiting)   . Sciatica   . Sinus drainage 01/28/2016  . Urinary urgency    Past Surgical History:  Procedure Laterality Date  . BREAST BIOPSY    . BREAST LUMPECTOMY     left 2017  . BREAST LUMPECTOMY WITH NEEDLE LOCALIZATION AND AXILLARY SENTINEL LYMPH NODE BX Left 02/04/2016   Procedure: LEFT BREAST LUMPECTOMY WITH 2 NEEDLE LOCALIZATIONS AND LEFT AXILLARY SENTINEL  LYMPH NODE BIOPSY;  Surgeon: [name], MD;  Location: [facility];  Service: General;  Laterality: Left;  LEFT BREAST LUMPECTOMY WITH 2 NEEDLE LOCALIZATIONS AND LEFT AXILLARY SENTINEL LYMPH NODE BIOPSY  . [place]  . COLONOSCOPY     Hx: of  . CYSTO  09/29/2005   with anterior repair  . ENDOMETRIAL ABLATION  09/29/2005  . INCISION AND DRAINAGE ABSCESS Left 02/21/2016   Procedure: INCISION AND DRAINAGE ABSCESS;  Surgeon: Excell Seltzer, MD;  Location: [facility];  Service: General;  Laterality: Left;  . INCONTINENCE SURGERY  09/29/2005  . KNEE ARTHROSCOPY W/ DEBRIDEMENT    . LUMBAR FUSION  04/28/2013   L3-4  . TONSILLECTOMY     age 58  . TUBAL LIGATION    . WISDOM TOOTH EXTRACTION     as a teenager    reports that she has been smoking cigarettes.  She has a 12.00 pack-year smoking history. she has never used smokeless tobacco. She reports that she does not drink alcohol or use drugs. family history includes Breast cancer in her paternal aunt; Colon cancer in her father; Colon polyps in her unknown relative; Diabetes in her unknown relative; Hypertension in her father. Allergies  Allergen Reactions  . Soap Shortness Of Breath and Rash    IVORY SOAP  . Adhesive [Tape] Other (  See Comments)    BLISTERS  . Augmentin [Amoxicillin-Pot Clavulanate] Nausea And Vomiting    Has patient had a PCN reaction causing immediate rash, facial/tongue/throat swelling, SOB or lightheadedness with hypotension: Unknown Has patient had a PCN reaction causing severe rash involving mucus membranes or skin necrosis: Unknown Has patient had a PCN reaction that required hospitalization Unknown Has patient had a PCN reaction occurring within the last 10 years: Unknown If all of the above answers are "NO", then may proceed with Cephalosporin use.   Marland Kitchen Cymbalta [Duloxetine Hcl] Nausea Only  . Latex Other (See Comments) and Itching    REACTION: blisters BLISTERS  . Nsaids Other  (See Comments)    LYMPHOCYTIC COLITIS  . Oxycodone Nausea Only    Not on an empty stomach  . Chlorhexidine Gluconate Rash    Pt says her skin looked like poison ivy  . Mupirocin Rash      Outpatient Encounter Medications as of 04/04/2017  Medication Sig  . amitriptyline (ELAVIL) 25 MG tablet Take 25 mg by mouth at bedtime.  Marland Kitchen amLODipine (NORVASC) 5 MG tablet Take 5 mg by mouth daily.  Marland Kitchen aspirin-acetaminophen-caffeine (EXCEDRIN MIGRAINE) 250-250-65 MG tablet Take 2 tablets by mouth every 6 (six) hours as needed for headache.  Marland Kitchen atorvastatin (LIPITOR) 20 MG tablet Take 20 mg by mouth daily.  . cholecalciferol (VITAMIN D) 1000 units tablet Take 1,000 Units by mouth daily.  Marland Kitchen letrozole (FEMARA) 2.5 MG tablet Take 1 tablet (2.5 mg total) by mouth daily.  Marland Kitchen lisinopril-hydrochlorothiazide (PRINZIDE,ZESTORETIC) 20-12.5 MG tablet Take 1 tablet by mouth daily.  . methocarbamol (ROBAXIN) 500 MG tablet Take 1 tablet (500 mg total) by mouth daily.  . Multiple Vitamins-Minerals (MULTIVITAMIN ADULT PO) Take 1 tablet by mouth daily.   . Probiotic Product (PROBIOTIC DAILY PO) Take 1 capsule by mouth daily.    No facility-administered encounter medications on file as of 04/04/2017.      REVIEW OF SYSTEMS  : All other systems reviewed and negative except where noted in the History of Present Illness.   PHYSICAL EXAM: BP 140/86   Pulse 82   Ht 5\' 8"  (1.727 m)   Wt 233 lb (105.7 kg)   BMI 35.43 kg/m  General: Well developed white female in no acute distress Head: Normocephalic and atraumatic Eyes:  Sclerae anicteric, conjunctiva pink. Ears: Normal auditory acuity Lungs: Clear throughout to auscultation; no increased WOB. Heart: Regular rate and rhythm; no M/R/G. Abdomen: Soft, non-distended.  BS present.  Non-tender. Rectal:  Will be done at the time of colonoscopy. Musculoskeletal: Symmetrical with no gross deformities  Skin: No lesions on visible extremities Extremities: No edema    Neurological: Alert oriented x 4, grossly non-focal Psychological:  Alert and cooperative. Normal mood and affect  ASSESSMENT AND PLAN: *Lymphocytic colitis:  Not on any medication for a few years.  Previously responded to Entocort.  Will try to get Uceris 9 mg daily. *Family history of colon cancer in her father:  Last colonoscopy was in 2009.  Will schedule with Dr. Fuller Plan.   CC:  [name], MD

## 2017-04-11 ENCOUNTER — Telehealth: Payer: Self-pay | Admitting: Gastroenterology

## 2017-04-11 MED ORDER — BUDESONIDE 3 MG PO CPEP: 9.0000 mg | ORAL_CAPSULE | Freq: Every day | ORAL | 3 refills | Status: DC

## 2017-04-11 NOTE — Telephone Encounter (Signed)
Patient informed of Jessica's orders and medication sent in for Entocort 3 mg 3 tablets daily #90 with 3 refills sent to [facility].

## 2017-04-11 NOTE — Telephone Encounter (Signed)
Let's try and see if they will cover Entocort 9 mg daily (so that would be 3 pills daily since they are each 3 mg) instead as she had taken that in the past.

## 2017-05-17 ENCOUNTER — Ambulatory Visit: Payer: Medicaid Other | Admitting: Gastroenterology

## 2017-05-18 ENCOUNTER — Other Ambulatory Visit: Payer: Self-pay

## 2017-05-18 ENCOUNTER — Ambulatory Visit (AMBULATORY_SURGERY_CENTER): Payer: Medicaid Other | Admitting: Gastroenterology

## 2017-05-18 ENCOUNTER — Encounter: Payer: Self-pay | Admitting: Gastroenterology

## 2017-05-18 VITALS — BP 140/77 | HR 79 | Temp 98.7°F | Resp 16 | Ht 68.0 in | Wt 233.0 lb

## 2017-05-18 DIAGNOSIS — Z800 Family history of malignant neoplasm of digestive organs: Secondary | ICD-10-CM

## 2017-05-18 DIAGNOSIS — Z1211 Encounter for screening for malignant neoplasm of colon: Secondary | ICD-10-CM | POA: Diagnosis not present

## 2017-05-18 DIAGNOSIS — Z1212 Encounter for screening for malignant neoplasm of rectum: Secondary | ICD-10-CM | POA: Diagnosis not present

## 2017-05-18 MED ORDER — SODIUM CHLORIDE 0.9 % IV SOLN: 500.0000 mL | Freq: Once | INTRAVENOUS | Status: AC

## 2017-05-18 NOTE — Patient Instructions (Signed)
YOU HAD AN ENDOSCOPIC PROCEDURE TODAY AT THE [facility]:   Refer to the procedure report that was given to you for any specific questions about what was found during the examination.  If the procedure report does not answer your questions, please call your gastroenterologist to clarify.  If you requested that your care partner not be given the details of your procedure findings, then the procedure report has been included in a sealed envelope for you to review at your convenience later.  YOU SHOULD EXPECT: Some feelings of bloating in the abdomen. Passage of more gas than usual.  Walking can help get rid of the air that was put into your GI tract during the procedure and reduce the bloating. If you had a lower endoscopy (such as a colonoscopy or flexible sigmoidoscopy) you may notice spotting of blood in your stool or on the toilet paper. If you underwent a bowel prep for your procedure, you may not have a normal bowel movement for a few days.  Please Note:  You might notice some irritation and congestion in your nose or some drainage.  This is from the oxygen used during your procedure.  There is no need for concern and it should clear up in a day or so.  SYMPTOMS TO REPORT IMMEDIATELY:   Following lower endoscopy (colonoscopy or flexible sigmoidoscopy):  Excessive amounts of blood in the stool  Significant tenderness or worsening of abdominal pains  Swelling of the abdomen that is new, acute  Fever of 100F or higher  For urgent or emergent issues, a gastroenterologist can be reached at any hour by calling [phone].   DIET:  We do recommend a small meal at first, but then you may proceed to your regular diet.  Drink plenty of fluids but you should avoid alcoholic beverages for 24 hours.  ACTIVITY:  You should plan to take it easy for the rest of today and you should NOT DRIVE or use heavy machinery until tomorrow (because of the sedation medicines used during the test).     FOLLOW UP: Our staff will call the number listed on your records the next business day following your procedure to check on you and address any questions or concerns that you may have regarding the information given to you following your procedure. If we do not reach you, we will leave a message.  However, if you are feeling well and you are not experiencing any problems, there is no need to return our call.  We will assume that you have returned to your regular daily activities without incident.  If any biopsies were taken you will be contacted by phone or by letter within the next 1-3 weeks.  Please call us at [phone] if you have not heard about the biopsies in 3 weeks.    SIGNATURES/CONFIDENTIALITY: You and/or your care partner have signed paperwork which will be entered into your electronic medical record.  These signatures attest to the fact that that the information above on your After Visit Summary has been reviewed and is understood.  Full responsibility of the confidentiality of this discharge information lies with you and/or your care-partner. 

## 2017-05-18 NOTE — Progress Notes (Signed)
Report to PACU, RN, vss, BBS= Clear.  

## 2017-05-18 NOTE — Op Note (Signed)
[place] Patient Name: [name] Procedure Date: 05/18/2017 3:50 PM MRN: [id number] Endoscopist: Ladene Artist , MD Age: 57 Referring MD:  Date of Birth: 1961-04-11 Gender: Female Account #: [id number] Procedure:                Colonoscopy Indications:              Screening in patient at increased risk: Family                            history of 1st-degree relative with colorectal                            cancer before age 49 years Medicines:                Monitored Anesthesia Care Procedure:                Pre-Anesthesia Assessment:                           - Prior to the procedure, a History and Physical                            was performed, and patient medications and                            allergies were reviewed. The patient's tolerance of                            previous anesthesia was also reviewed. The risks                            and benefits of the procedure and the sedation                            options and risks were discussed with the patient.                            All questions were answered, and informed consent                            was obtained. Prior Anticoagulants: The patient has                            taken no previous anticoagulant or antiplatelet                            agents. ASA Grade Assessment: II - A patient with                            mild systemic disease. After reviewing the risks                            and benefits, the patient was deemed in  satisfactory condition to undergo the procedure.                           After obtaining informed consent, the colonoscope                            was passed under direct vision. Throughout the                            procedure, the patient's blood pressure, pulse, and                            oxygen saturations were monitored continuously. The                            Colonoscope was introduced through  the anus and                            advanced to the the cecum, identified by                            appendiceal orifice and ileocecal valve. The                            ileocecal valve, appendiceal orifice, and rectum                            were photographed. The quality of the bowel                            preparation was good. The colonoscopy was performed                            without difficulty. The patient tolerated the                            procedure well. Scope In: 3:58:34 PM Scope Out: 4:12:04 PM Scope Withdrawal Time: 0 hours 10 minutes 37 seconds  Total Procedure Duration: 0 hours 13 minutes 30 seconds  Findings:                 The perianal and digital rectal examinations were                            normal.                           Internal hemorrhoids were found during                            retroflexion. The hemorrhoids were small and Grade                            I (internal hemorrhoids that do not prolapse).  The exam was otherwise without abnormality on                            direct and retroflexion views. Complications:            No immediate complications. Estimated blood loss:                            None. Estimated Blood Loss:     Estimated blood loss: none. Impression:               - Internal hemorrhoids.                           - The examination was otherwise normal on direct                            and retroflexion views.                           - No specimens collected. Recommendation:           - Repeat colonoscopy in 5 years for screening                            purposes.                           - Patient has a contact number available for                            emergencies. The signs and symptoms of potential                            delayed complications were discussed with the                            patient. Return to normal activities tomorrow.                             Written discharge instructions were provided to the                            patient.                           - Resume previous diet.                           - Continue present medications. Ladene Artist, MD 05/18/2017 4:14:34 PM This report has been signed electronically.

## 2017-05-21 ENCOUNTER — Telehealth: Payer: Self-pay | Admitting: *Deleted

## 2017-05-21 NOTE — Telephone Encounter (Signed)
  Follow up Call-  Call back number 05/18/2017  Post procedure Call Back phone  # [phone]  Permission to leave phone message Yes  Some recent data might be hidden     Patient questions:  Do you have a fever, pain , or abdominal swelling? No. Pain Score  0 *  Have you tolerated food without any problems? Yes.    Have you been able to return to your normal activities? Yes.    Do you have any questions about your discharge instructions: Diet   No. Medications  No. Follow up visit  No.  Do you have questions or concerns about your Care? No.  Actions: * If pain score is 4 or above: No action needed, pain <4. Patient stating she is still having some diarrhea. Patient stating she did restart her medication for colitis. Patient informed to call back if continues or worsens or any other symptoms including severe pain, bleeding or fever.

## 2017-06-06 ENCOUNTER — Telehealth: Payer: Self-pay | Admitting: Hematology and Oncology

## 2017-06-06 NOTE — Telephone Encounter (Signed)
Returned call to patient.  She requested appointment for 2/1 to be rescheduled .  Appt Moved

## 2017-06-08 ENCOUNTER — Ambulatory Visit: Payer: Medicaid Other | Admitting: Hematology and Oncology

## 2017-06-16 ENCOUNTER — Emergency Department (HOSPITAL_COMMUNITY)
Admission: EM | Admit: 2017-06-16 | Discharge: 2017-06-17 | Discharge status: Against Medical Advice | Payer: Medicaid Other

## 2017-06-16 NOTE — ED Notes (Signed)
Pt states does not want to wait

## 2017-06-17 ENCOUNTER — Emergency Department (HOSPITAL_COMMUNITY): Payer: Medicaid Other

## 2017-06-17 ENCOUNTER — Other Ambulatory Visit: Payer: Self-pay

## 2017-06-17 ENCOUNTER — Emergency Department (HOSPITAL_COMMUNITY)
Admission: EM | Admit: 2017-06-17 | Discharge: 2017-06-17 | Disposition: A | Discharge status: Home / Self Care | Payer: Medicaid Other | Attending: Emergency Medicine | Admitting: Emergency Medicine

## 2017-06-17 ENCOUNTER — Encounter (HOSPITAL_COMMUNITY): Payer: Self-pay | Admitting: *Deleted

## 2017-06-17 DIAGNOSIS — F17210 Nicotine dependence, cigarettes, uncomplicated: Secondary | ICD-10-CM | POA: Insufficient documentation

## 2017-06-17 DIAGNOSIS — Z79899 Other long term (current) drug therapy: Secondary | ICD-10-CM | POA: Diagnosis not present

## 2017-06-17 DIAGNOSIS — S79911A Unspecified injury of right hip, initial encounter: Secondary | ICD-10-CM | POA: Diagnosis present

## 2017-06-17 DIAGNOSIS — Y929 Unspecified place or not applicable: Secondary | ICD-10-CM | POA: Diagnosis not present

## 2017-06-17 DIAGNOSIS — I10 Essential (primary) hypertension: Secondary | ICD-10-CM | POA: Diagnosis not present

## 2017-06-17 DIAGNOSIS — M5440 Lumbago with sciatica, unspecified side: Secondary | ICD-10-CM | POA: Diagnosis not present

## 2017-06-17 DIAGNOSIS — Y939 Activity, unspecified: Secondary | ICD-10-CM | POA: Insufficient documentation

## 2017-06-17 DIAGNOSIS — Y999 Unspecified external cause status: Secondary | ICD-10-CM | POA: Insufficient documentation

## 2017-06-17 DIAGNOSIS — Z853 Personal history of malignant neoplasm of breast: Secondary | ICD-10-CM | POA: Diagnosis not present

## 2017-06-17 DIAGNOSIS — W07XXXA Fall from chair, initial encounter: Secondary | ICD-10-CM | POA: Diagnosis not present

## 2017-06-17 DIAGNOSIS — M5431 Sciatica, right side: Secondary | ICD-10-CM

## 2017-06-17 MED ORDER — IBUPROFEN 600 MG PO TABS: 600.0000 mg | ORAL_TABLET | Freq: Three times a day (TID) | ORAL | 0 refills | Status: DC | PRN

## 2017-06-17 MED ORDER — CYCLOBENZAPRINE HCL 10 MG PO TABS: 10.0000 mg | ORAL_TABLET | Freq: Three times a day (TID) | ORAL | 0 refills | Status: DC | PRN

## 2017-06-17 MED ORDER — ONDANSETRON 8 MG PO TBDP
8.0000 mg | ORAL_TABLET | Freq: Once | ORAL | Status: AC
  Administered 2017-06-17: 8 mg via ORAL
  Filled 2017-06-17: qty 1

## 2017-06-17 MED ORDER — TRAMADOL HCL 50 MG PO TABS: 50.0000 mg | ORAL_TABLET | Freq: Four times a day (QID) | ORAL | 0 refills | Status: DC | PRN

## 2017-06-17 MED ORDER — HYDROMORPHONE HCL 2 MG/ML IJ SOLN
2.0000 mg | Freq: Once | INTRAMUSCULAR | Status: AC
  Administered 2017-06-17: 2 mg via INTRAVENOUS
  Filled 2017-06-17: qty 1

## 2017-06-17 MED ORDER — PREDNISONE 20 MG PO TABS: 40.0000 mg | ORAL_TABLET | Freq: Every day | ORAL | 0 refills | Status: AC

## 2017-06-17 NOTE — ED Provider Notes (Signed)
[facility] DEPT Provider Note   CSN: [id number] Arrival date & time: 06/17/17  0909     History   Chief Complaint Chief Complaint  Patient presents with  . Fall  . Hip Pain    rt    HPI [name] is a 57 y.o. female.  HPI Patient is a 57 year old female with a history of prior lumbar spine surgery emergency department ongoing low back pain and right hip pain with radiation she has a history of breast cancer condition.  She states she had a fall Thursday at which point she was in a chair in her chair to right lateral hip pain since fall.  Pain is moderate to severe in severity and worse with weightbearing on her right lower extremity.  No prior history of bony metastases.  No fevers or chills.  No dysuria or urinary frequency.  Patient started with symptoms.  She is also tried   Past Medical History:  Diagnosis Date  . Anxiety   . Arthritis    lower back  . Breast cancer ([place])   . Breast cancer of upper-outer quadrant of left female breast ([place]) 01/03/2016  . Complication of anesthesia    hx. of combativeness as child; had severe migraine headache day after back surgery  . Cough due to ACE inhibitor 01/28/2016  . Depression   . Eczema    right hip  . High cholesterol   . History of radiation therapy 05/29/16- 07/14/16   Left Breast/ 50.4 Gy n 25 fractions, Left Breast Boost/ 10 Gy in 5 fractions.   . Hypertension    states under control with meds., has been on med. since age 2  . Lymphocytic colitis   . Migraines   . Personal history of radiation therapy   . PONV (postoperative nausea and vomiting)   . Sciatica   . Sinus drainage 01/28/2016  . Urinary urgency     Patient Active Problem List   Diagnosis Date Noted  . FH: colon cancer 04/04/2017  . Cellulitis of breast   . Septic shock ([place])   . Breast cancer of upper-outer quadrant of left female breast ([place]) 01/03/2016  . Spinal stenosis, lumbar region, with neurogenic  claudication 04/28/2013    Class: Chronic  . Spondylolisthesis of lumbar region 04/28/2013    Class: Chronic  . Dehydration 08/09/2011  . Hypertension 08/09/2011  . Hyperlipidemia LDL goal < 100 08/09/2011  . Lymphocytic colitis 08/09/2011  . TIA (transient ischemic attack) 08/09/2011  . ANXIETY 02/24/2010  . DEPRESSION 02/24/2010  . FLATULENCE-GAS-BLOATING 02/07/2008  . OTH&UNSPEC NONINFECTIOUS GASTROENTERITIS&COLITIS 12/12/2007  . DIARRHEA 10/10/2007  . ABDOMINAL PAIN, GENERALIZED 10/10/2007    Past Surgical History:  Procedure Laterality Date  . BREAST BIOPSY    . BREAST LUMPECTOMY     left 2017  . BREAST LUMPECTOMY WITH NEEDLE LOCALIZATION AND AXILLARY SENTINEL LYMPH NODE BX Left 02/04/2016   Procedure: LEFT BREAST LUMPECTOMY WITH 2 NEEDLE LOCALIZATIONS AND LEFT AXILLARY SENTINEL LYMPH NODE BIOPSY;  Surgeon: [name], MD;  Location: [facility];  Service: General;  Laterality: Left;  LEFT BREAST LUMPECTOMY WITH 2 NEEDLE LOCALIZATIONS AND LEFT AXILLARY SENTINEL LYMPH NODE BIOPSY  . [place]  . COLONOSCOPY     Hx: of  . CYSTO  09/29/2005   with anterior repair  . ENDOMETRIAL ABLATION  09/29/2005  . INCISION AND DRAINAGE ABSCESS Left 02/21/2016   Procedure: INCISION AND DRAINAGE ABSCESS;  Surgeon: Excell Seltzer, MD;  Location:  [facility];  Service: General;  Laterality: Left;  . INCONTINENCE SURGERY  09/29/2005  . KNEE ARTHROSCOPY W/ DEBRIDEMENT    . LUMBAR FUSION  04/28/2013   L3-4  . TONSILLECTOMY     age 89  . TUBAL LIGATION    . WISDOM TOOTH EXTRACTION     as a teenager    OB History    Gravida Para Term Preterm AB Living   3 2 2   1 2    SAB TAB Ectopic Multiple Live Births   1               Home Medications    Prior to Admission medications   Medication Sig Start Date End Date Taking? Authorizing Provider  amitriptyline (ELAVIL) 25 MG tablet Take 25 mg by mouth at bedtime.   Yes [provider]  amLODipine  (NORVASC) 5 MG tablet Take 5 mg by mouth daily.   Yes [provider]  aspirin-acetaminophen-caffeine (EXCEDRIN MIGRAINE) [phone] MG tablet Take 2 tablets by mouth every 6 (six) hours as needed for headache.   Yes [provider]  atorvastatin (LIPITOR) 20 MG tablet Take 20 mg by mouth daily.   Yes [provider]  budesonide (ENTOCORT EC) 3 MG 24 hr capsule Take 3 capsules (9 mg total) by mouth daily. 04/11/17  Yes Zehr, Laban Emperor, PA-C  cholecalciferol (VITAMIN D) 1000 units tablet Take 1,000 Units by mouth daily.   Yes [provider]  letrozole (FEMARA) 2.5 MG tablet Take 1 tablet (2.5 mg total) by mouth daily. 09/22/16  Yes Causey, Charlestine Massed, NP  lisinopril-hydrochlorothiazide (PRINZIDE,ZESTORETIC) 20-12.5 MG tablet Take 1 tablet by mouth daily.   Yes [provider]  methocarbamol (ROBAXIN) 500 MG tablet Take 1 tablet (500 mg total) by mouth daily. 12/06/16  Yes Nicholas Lose, MD  Multiple Vitamins-Minerals (MULTIVITAMIN ADULT PO) Take 1 tablet by mouth daily.    Yes [provider]  Probiotic Product (PROBIOTIC DAILY PO) Take 1 capsule by mouth daily.    Yes [provider]  cyclobenzaprine (FLEXERIL) 10 MG tablet Take 1 tablet (10 mg total) by mouth 3 (three) times daily as needed for muscle spasms. 06/17/17   Jola Schmidt, MD  ibuprofen (ADVIL,MOTRIN) 600 MG tablet Take 1 tablet (600 mg total) by mouth every 8 (eight) hours as needed. 06/17/17   Jola Schmidt, MD  predniSONE (DELTASONE) 20 MG tablet Take 2 tablets (40 mg total) by mouth daily for 5 days. 06/17/17 06/22/17  Jola Schmidt, MD  traMADol (ULTRAM) 50 MG tablet Take 1 tablet (50 mg total) by mouth every 6 (six) hours as needed. 06/17/17   Jola Schmidt, MD    Family History Family History  Problem Relation Age of Onset  . Colon cancer Father        76-65  . Hypertension Father   . Colon polyps Unknown   . Diabetes Unknown   . Breast cancer Paternal Aunt      Social History Social History   Tobacco Use  . Smoking status: Current Every Day Smoker    Packs/day: 1.00    Years: 12.00    Pack years: 12.00    Types: Cigarettes  . Smokeless tobacco: Never Used  . Tobacco comment: Tobacco info given 04/04/17  Substance Use Topics  . Alcohol use: No  . Drug use: No     Allergies   Soap; Adhesive [tape]; Augmentin [amoxicillin-pot clavulanate]; Cymbalta [duloxetine hcl]; Latex; Nsaids; Oxycodone; Chlorhexidine gluconate; and Mupirocin   Review  of Systems Review of Systems  All other systems reviewed and are negative.    Physical Exam Updated Vital Signs BP (!) 150/92   Pulse 88   Temp 98.3 F (36.8 C) (Oral)   Resp 18   Ht 5\' 7"  (1.702 m)   Wt 99.8 kg (220 lb)   SpO2 94%   BMI 34.46 kg/m   Physical Exam  Constitutional: She is oriented to person, place, and time. She appears well-developed and well-nourished. No distress.  HENT:  Head: Normocephalic and atraumatic.  Eyes: EOM are normal.  Neck: Normal range of motion.  Cardiovascular: Normal rate, regular rhythm and normal heart sounds.  Pulmonary/Chest: Effort normal and breath sounds normal.  Abdominal: Soft. She exhibits no distension. There is no tenderness.  Musculoskeletal:  Full range of motion bilateral hips, knees bilateral feet.  No obvious swelling or bruising noted mild lumbar and paralumbar  Neurological: She is alert and oriented to person, place, and time.  Skin: Skin is warm and dry.  Psychiatric: She has a normal mood and affect. Judgment normal.  Nursing note and vitals reviewed.    ED Treatments / Results  Labs (all labs ordered are listed, but only abnormal results are displayed) Labs Reviewed - No data to display  EKG  EKG Interpretation None       Radiology Ct Lumbar Spine Wo Contrast  Result Date: 06/17/2017 CLINICAL DATA:  Fall on Thursday.  Back pain.  Right hip pain. EXAM: CT LUMBAR SPINE WITHOUT CONTRAST TECHNIQUE:  Multidetector CT imaging of the lumbar spine was performed without intravenous contrast administration. Multiplanar CT image reconstructions were also generated. COMPARISON:  None. FINDINGS: Segmentation: There are 6 non rib-bearing lumbar type vertebrae. Based on the chart, patient's fusion in 2014 was at L3-4. On chest x-ray 02/20/2016 only 11 paired ribs are present. Alignment: Levoscoliosis that would be better measured on standing radiographs Vertebrae: Postoperative changes below. No acute fracture, discitis, or aggressive bone lesion. Paraspinal and other soft tissues: Atherosclerotic calcification. Disc levels: T12- L1: Unremarkable. L1-L2: Disc narrowing with suspected large right paracentral and foraminal protrusion which would impinge on the L1 nerve root. L2-L3: Advanced asymmetric right-sided disc collapse and endplate degeneration. Bulky right-sided facet degenerative hypertrophy. Severe right foraminal impingement. High-grade spinal stenosis. L3-L4: PLIF. No visible arthrodesis between the bodies but there is solid posterolateral bone on the left. No hardware failure. No residual bony impingement. L4-L5: Advanced disc degeneration with asymmetric left-sided height loss and far-lateral spurring. Facet arthropathy with severe joint degeneration on the left. Patent canal and foramina L5-S1:Pseudoarticulation between the left transverse process and sacrum with hypertrophic change. No impingement. IMPRESSION: 1. 6 lumbar type vertebral bodies. Only 11 ribs are seen on 02/20/2016 chest x-ray and the T12 vertebra may be non rib-bearing. To match chart history of prior L3-4 surgery the lowest open disc space is numbered L5-S1. 2. L1-2 possible large right foraminal protrusion which would compress the right L1 nerve root. Consider MRI characterization. 3. L2-3 severe right-sided disc degeneration and facet arthropathy. Severe right foraminal impingement. Spinal stenosis is high-grade. 4. L3-4 PLIF. Left  posterior-lateral solid arthrodesis. No evidence of residual impingement. 5. Noncompressive degenerative changes above. Electronically Signed   By: Monte Fantasia M.D.   On: 06/17/2017 12:16   Ct Hip Right Wo Contrast  Result Date: 06/17/2017 CLINICAL DATA:  [place] Thursday.  Persistent hip pain. EXAM: CT OF THE RIGHT HIP WITHOUT CONTRAST TECHNIQUE: Multidetector CT imaging of the right hip was performed according to the standard protocol.  Multiplanar CT image reconstructions were also generated. COMPARISON:  Radiographs 06/17/2017 FINDINGS: The right hip is normally located. No hip fracture or significant degenerative changes. No evidence of AVN. The visualized right hemipelvis is intact. No definite pubic rami fractures. The surrounding hip and pelvic musculature are grossly normal by CT. No obvious intramuscular hematoma. No significant intrapelvic abnormalities are identified. IMPRESSION: No definite acute hip or pelvic fractures. Electronically Signed   By: Marijo Sanes M.D.   On: 06/17/2017 12:13   Dg Hip Unilat  With Pelvis 2-3 Views Right  Result Date: 06/17/2017 CLINICAL DATA:  Right hip pain and groin pain. EXAM: DG HIP (WITH OR WITHOUT PELVIS) 2-3V RIGHT COMPARISON:  None. FINDINGS: No fracture or dislocation identified. The bony pelvis is intact. There is mild osteoarthritis both hip joints which appear symmetric. No bony lesions. Visible spinal fusion at the L3-4 level. IMPRESSION: No acute fracture or dislocation. Mild osteoarthritis of the right hip. Electronically Signed   By: Aletta Edouard M.D.   On: 06/17/2017 10:31    Procedures Procedures (including critical care time)  Medications Ordered in ED Medications  HYDROmorphone (DILAUDID) injection 2 mg (2 mg Intravenous Given 06/17/17 1156)  ondansetron (ZOFRAN-ODT) disintegrating tablet 8 mg (8 mg Oral Given 06/17/17 1155)     Initial Impression / Assessment and Plan / ED Course  I have reviewed the triage vital signs and the  nursing notes.  Pertinent labs & imaging results that were available during my care of the patient were reviewed by me and considered in my medical decision making (see chart for details).     No acute osseous is noted CT.  This may represent right-sided sciatica versus bone contusion.  She has a Licensed conveyancer.  Home a short course of pain medication and muscle relaxants.  Orthopedic spine Follow-up.  She understands to return to the emergency department for new or worsening symptoms   Final Clinical Impressions(s) / ED Diagnoses   Final diagnoses:  Sciatica, right side  Acute right-sided low back pain with sciatica, sciatica laterality unspecified    ED Discharge Orders        Ordered    traMADol (ULTRAM) 50 MG tablet  Every 6 hours PRN     06/17/17 1334    predniSONE (DELTASONE) 20 MG tablet  Daily     06/17/17 1334    cyclobenzaprine (FLEXERIL) 10 MG tablet  3 times daily PRN     06/17/17 1334    ibuprofen (ADVIL,MOTRIN) 600 MG tablet  Every 8 hours PRN     06/17/17 1334       Jola Schmidt, MD 06/17/17 1557

## 2017-06-17 NOTE — ED Notes (Signed)
Upon discharge; patinent vomit 100cc into bag. MD aware. Patient given gingerale.

## 2017-06-17 NOTE — ED Triage Notes (Signed)
Pt fell Thursday as her chair tipped over, pain on rt hip area, muscle spasms and vomiting started yesterday related to pain, has used ice and heat to area without relief

## 2017-06-20 ENCOUNTER — Telehealth: Payer: Self-pay | Admitting: Hematology and Oncology

## 2017-06-20 ENCOUNTER — Inpatient Hospital Stay: Payer: Medicaid Other | Attending: Hematology and Oncology | Admitting: Hematology and Oncology

## 2017-06-20 DIAGNOSIS — M545 Low back pain: Secondary | ICD-10-CM | POA: Diagnosis not present

## 2017-06-20 DIAGNOSIS — M5126 Other intervertebral disc displacement, lumbar region: Secondary | ICD-10-CM | POA: Insufficient documentation

## 2017-06-20 DIAGNOSIS — Z79811 Long term (current) use of aromatase inhibitors: Secondary | ICD-10-CM | POA: Diagnosis not present

## 2017-06-20 DIAGNOSIS — C50412 Malignant neoplasm of upper-outer quadrant of left female breast: Secondary | ICD-10-CM | POA: Diagnosis not present

## 2017-06-20 DIAGNOSIS — N951 Menopausal and female climacteric states: Secondary | ICD-10-CM | POA: Diagnosis not present

## 2017-06-20 DIAGNOSIS — Z170 Estrogen receptor positive status [ER+]: Secondary | ICD-10-CM

## 2017-06-20 MED ORDER — OXYCODONE-ACETAMINOPHEN 5-325 MG PO TABS: 1.0000 | ORAL_TABLET | ORAL | 0 refills | Status: DC | PRN

## 2017-06-20 NOTE — Telephone Encounter (Signed)
Gave avs and calendar for February 2020 °

## 2017-06-20 NOTE — Assessment & Plan Note (Signed)
12/23/2015: Left breast 1.2 cm spiculated mass 2:00 position, 5 mm intramammary lymph node 2:00 position needs biopsy, 6 mm simple cyst at 1:00 position, T1c Nx stage IA 12/29/2015: Left breast biopsy UOQ 2:00 and centimeters from nipple: IDC, grade 1, ER 100%, PR 90%, Ki-67 10%, HER-2 negative ratio 1.16 02/04/2016: Left lumpectomy: IDC grade 1, 1.1 cm, ADH, ALH,0/3 lymph nodes negative, margins negative,ER 100%, PR 90%, HER-2 negative ratio 1.16, Ki-67 10%, T1 CN 0 stage IA Oncotype DX score 13, risk of recurrence 9% with tamoxifen alone Adjuvant radiation therapy: 05/26/2016- 07/06/2016 ----------------------------------------------------------------------------------------------------------------------------------------------------- Treatment plan: Adjuvant antiestrogen therapy with letrozole 2.5 mg daily started May 2018 Letrozole toxicities: 1. Hot flashes 2. intermittent muscle cramps especially in the left shoulder  Left shoulder range of motion issues:physical therapy  Return to clinic in  1 year for follow-up

## 2017-06-20 NOTE — Progress Notes (Signed)
Patient Care Team: [name], MD as PCP - General (Family Medicine) [name], MD as Consulting Physician (General Surgery) Nicholas Lose, MD as Consulting Physician (Hematology and Oncology) Eppie Gibson, MD as Attending Physician (Radiation Oncology) Gardenia Phlegm, NP as Nurse Practitioner (Hematology and Oncology)  DIAGNOSIS:  Encounter Diagnosis  Name Primary?  . Malignant neoplasm of upper-outer quadrant of left breast in female, estrogen receptor positive ([place])     SUMMARY OF ONCOLOGIC HISTORY:   Breast cancer of upper-outer quadrant of left female breast ([place])   12/23/2015 Mammogram    Left breast 1.2 cm spiculated mass 2:00 position, 5 mm intramammary lymph node 2:00 position needs biopsy, 6 mm simple cyst at 1:00 position, T1c Nx stage IA      12/29/2015 Initial Diagnosis    Left breast biopsy UOQ 2:00 and centimeters from nipple: IDC, grade 1, ER 100%, PR 90%, Ki-67 10%, HER-2 negative ratio 1.16      02/04/2016 Surgery    Left lumpectomy: IDC grade 1, 1.1 cm, ADH, ALH,0/3 lymph nodes negative, margins negative,ER 100%, PR 90%, HER-2 negative ratio 1.16, Ki-67 10%, T1 CN 0 stage IA      02/04/2016 Oncotype testing    Oncotype DX score 13, risk of recurrence 9% with tamoxifen alone      05/29/2016 - 07/14/2016 Radiation Therapy    Adjuvant radiation therapy Isidore Moos): 1) Left Breast / 50.4 Gy in 25 fractions.  2) Left Breast Boost / 10 Gy in 5 fractions      09/2016 -  Anti-estrogen oral therapy    Letrozole daily       CHIEF COMPLIANT: Complaining of severe back pain related to a prolapsed disc  INTERVAL HISTORY: [name] is a 57 year old with above-mentioned history of left breast cancer underwent lumpectomy and radiation is currently on letrozole therapy.  She is tolerating letrozole reasonably well except for night sweats.  She has recently had intense pain related to herniated disc.  She is seeing orthopedic and they placed her on  prednisone therapy.  It does not seem to have worked so far.  She is in a lot of pain and discomfort.  She is taking Percocet as well as muscle relaxants.  REVIEW OF SYSTEMS:   Constitutional: Denies fevers, chills or abnormal weight loss Eyes: Denies blurriness of vision Ears, nose, mouth, throat, and face: Denies mucositis or sore throat Respiratory: Denies cough, dyspnea or wheezes Cardiovascular: Denies palpitation, chest discomfort Gastrointestinal:  Denies nausea, heartburn or change in bowel habits Skin: Denies abnormal skin rashes Lymphatics: Denies new lymphadenopathy or easy bruising Neurological:Denies numbness, tingling or new weaknesses Behavioral/Psych: Mood is stable, no new changes  Extremities: No lower extremity edema Breast: Postradiation changes in the left breast All other systems were reviewed with the patient and are negative.  I have reviewed the past medical history, past surgical history, social history and family history with the patient and they are unchanged from previous note.  ALLERGIES:  is allergic to soap; adhesive [tape]; augmentin [amoxicillin-pot clavulanate]; cymbalta [duloxetine hcl]; latex; nsaids; oxycodone; chlorhexidine gluconate; and mupirocin.  MEDICATIONS:  Current Outpatient Medications  Medication Sig Dispense Refill  . amitriptyline (ELAVIL) 25 MG tablet Take 25 mg by mouth at bedtime.    Marland Kitchen amLODipine (NORVASC) 5 MG tablet Take 5 mg by mouth daily.    Marland Kitchen aspirin-acetaminophen-caffeine (EXCEDRIN MIGRAINE) 250-250-65 MG tablet Take 2 tablets by mouth every 6 (six) hours as needed for headache.    Marland Kitchen atorvastatin (LIPITOR) 20 MG  tablet Take 20 mg by mouth daily.    . budesonide (ENTOCORT EC) 3 MG 24 hr capsule Take 3 capsules (9 mg total) by mouth daily. 90 capsule 3  . cholecalciferol (VITAMIN D) 1000 units tablet Take 1,000 Units by mouth daily.    . cyclobenzaprine (FLEXERIL) 10 MG tablet Take 1 tablet (10 mg total) by mouth 3 (three) times  daily as needed for muscle spasms. 12 tablet 0  . ibuprofen (ADVIL,MOTRIN) 600 MG tablet Take 1 tablet (600 mg total) by mouth every 8 (eight) hours as needed. 15 tablet 0  . letrozole (FEMARA) 2.5 MG tablet Take 1 tablet (2.5 mg total) by mouth daily. 30 tablet 5  . lisinopril-hydrochlorothiazide (PRINZIDE,ZESTORETIC) 20-12.5 MG tablet Take 1 tablet by mouth daily.    . methocarbamol (ROBAXIN) 500 MG tablet Take 1 tablet (500 mg total) by mouth daily.    . Multiple Vitamins-Minerals (MULTIVITAMIN ADULT PO) Take 1 tablet by mouth daily.     . predniSONE (DELTASONE) 20 MG tablet Take 2 tablets (40 mg total) by mouth daily for 5 days. 10 tablet 0  . Probiotic Product (PROBIOTIC DAILY PO) Take 1 capsule by mouth daily.     . traMADol (ULTRAM) 50 MG tablet Take 1 tablet (50 mg total) by mouth every 6 (six) hours as needed. 15 tablet 0   Current Facility-Administered Medications  Medication Dose Route Frequency Provider Last Rate Last Dose  . 0.9 %  sodium chloride infusion  500 mL Intravenous Once Ladene Artist, MD        PHYSICAL EXAMINATION: ECOG PERFORMANCE STATUS: 1 - Symptomatic but completely ambulatory  Vitals:   06/20/17 1518  BP: (!) 172/105  Pulse: 89  Resp: 18  Temp: 98.8 F (37.1 C)  SpO2: 98%   Filed Weights   06/20/17 1518  Weight: 233 lb 11.2 oz (106 kg)    GENERAL:alert, no distress and comfortable SKIN: skin color, texture, turgor are normal, no rashes or significant lesions EYES: normal, Conjunctiva are pink and non-injected, sclera clear OROPHARYNX:no exudate, no erythema and lips, buccal mucosa, and tongue normal  NECK: supple, thyroid normal size, non-tender, without nodularity LYMPH:  no palpable lymphadenopathy in the cervical, axillary or inguinal LUNGS: clear to auscultation and percussion with normal breathing effort HEART: regular rate & rhythm and no murmurs and no lower extremity edema ABDOMEN:abdomen soft, non-tender and normal bowel  sounds MUSCULOSKELETAL:no cyanosis of digits and no clubbing  NEURO: alert & oriented x 3 with fluent speech, no focal motor/sensory deficits EXTREMITIES: No lower extremity edema BREAST: Postradiation changes in the left breast with scar tissue from prior surgery.  No palpable masses or nodules in either right or left breasts. No palpable axillary supraclavicular or infraclavicular adenopathy no breast tenderness or nipple discharge. (exam performed in the presence of a chaperone)  LABORATORY DATA:  I have reviewed the data as listed CMP Latest Ref Rng & Units 02/23/2016 02/22/2016 02/21/2016  Glucose 65 - 99 mg/dL 230(H) 190(H) 174(H)  BUN 6 - 20 mg/dL 14 18 21(H)  Creatinine 0.44 - 1.00 mg/dL 0.86 1.13(H) 1.36(H)  Sodium 135 - 145 mmol/L 137 136 134(L)  Potassium 3.5 - 5.1 mmol/L 4.7 4.1 4.7  Chloride 101 - 111 mmol/L 108 110 103  CO2 22 - 32 mmol/L 25 21(L) 24  Calcium 8.9 - 10.3 mg/dL 8.6(L) 7.4(L) 7.9(L)  Total Protein 6.5 - 8.1 g/dL - - -  Total Bilirubin 0.3 - 1.2 mg/dL - - -  Alkaline Phos 38 -  126 U/L - - -  AST 15 - 41 U/L - - -  ALT 14 - 54 U/L - - -    Lab Results  Component Value Date   WBC 17.3 (H) 02/25/2016   HGB 12.6 02/25/2016   HCT 37.8 02/25/2016   MCV 91.5 02/25/2016   PLT 303 02/25/2016   NEUTROABS 19.2 (H) 02/20/2016    ASSESSMENT & PLAN:  Breast cancer of upper-outer quadrant of left female breast ([place]) 12/23/2015: Left breast 1.2 cm spiculated mass 2:00 position, 5 mm intramammary lymph node 2:00 position needs biopsy, 6 mm simple cyst at 1:00 position, T1c Nx stage IA 12/29/2015: Left breast biopsy UOQ 2:00 and centimeters from nipple: IDC, grade 1, ER 100%, PR 90%, Ki-67 10%, HER-2 negative ratio 1.16 02/04/2016: Left lumpectomy: IDC grade 1, 1.1 cm, ADH, ALH,0/3 lymph nodes negative, margins negative,ER 100%, PR 90%, HER-2 negative ratio 1.16, Ki-67 10%, T1 CN 0 stage IA Oncotype DX score 13, risk of recurrence 9% with tamoxifen alone Adjuvant  radiation therapy: 05/26/2016- 07/06/2016 ----------------------------------------------------------------------------------------------------------------------------------------------------- Treatment plan: Adjuvant antiestrogen therapy with letrozole 2.5 mg daily started May 2018 Letrozole toxicities: 1. Hot flashes  Severe low back pain related to herniated disc follows with orthopedic surgery.  Breast cancer surveillance: 1.  Mammograms 01/22/2017: Benign  2. breast exam 06/20/2017: Benign  Return to clinic in  1 year for follow-up     I spent 25 minutes talking to the patient of which more than half was spent in counseling and coordination of care.  No orders of the defined types were placed in this encounter.  The patient has a good understanding of the overall plan. she agrees with it. she will call with any problems that may develop before the next visit here.   Harriette Ohara, MD 06/20/17

## 2017-06-30 ENCOUNTER — Other Ambulatory Visit: Payer: Self-pay | Admitting: Orthopaedic Surgery

## 2017-06-30 DIAGNOSIS — M5416 Radiculopathy, lumbar region: Secondary | ICD-10-CM

## 2017-07-01 ENCOUNTER — Encounter (HOSPITAL_COMMUNITY): Payer: Self-pay | Admitting: *Deleted

## 2017-07-01 ENCOUNTER — Other Ambulatory Visit: Payer: Self-pay

## 2017-07-01 ENCOUNTER — Ambulatory Visit (HOSPITAL_COMMUNITY)
Admission: EM | Admit: 2017-07-01 | Discharge: 2017-07-01 | Disposition: A | Discharge status: Home / Self Care | Payer: Medicaid Other | Attending: Family Medicine | Admitting: Family Medicine

## 2017-07-01 DIAGNOSIS — L03221 Cellulitis of neck: Secondary | ICD-10-CM

## 2017-07-01 MED ORDER — SULFAMETHOXAZOLE-TRIMETHOPRIM 800-160 MG PO TABS: 1.0000 | ORAL_TABLET | Freq: Two times a day (BID) | ORAL | 0 refills | Status: DC

## 2017-07-01 NOTE — ED Triage Notes (Signed)
Bump on the back of her neck since Friday, in the middle but mainly on the right side

## 2017-07-01 NOTE — Discharge Instructions (Signed)
There is not an obvious area which would benefit from an incision at this time. We will start antibiotics. Warm compresses to the area at least three times a day. Please make an appointment with your primary care provider for recheck in the next 2-3 days to ensure improvement and reevaluate if incision possible. If worsening of symptoms, fevers, nausea, vomiting, pain or otherwise worsening please return to be seen or go to Er.

## 2017-07-01 NOTE — ED Provider Notes (Signed)
[place]    CSN: [id number] Arrival date & time: 07/01/17  1214     History   Chief Complaint Chief Complaint  Patient presents with  . Mass    HPI [name] is a 57 y.o. female.   Neri presents with complaints of concern for abscess to posterior neck which she first noticed two days ago. Was originally a raised area but pain developed yesterday. She states she has had cellulitis in the past. Has had sepsis due to cellulitis in the past. History of breast cancer, htn, tia, neck pain, lymphocytic colitis. No known fevers. She denies any known history of MRSA. Pain is 7/10. No known drainage. Taking zanaflex for neck pain currently. Has not taken any other medications for symptoms. Unable to take nsaids due to colitis hx.    ROS per HPI.       Past Medical History:  Diagnosis Date  . Anxiety   . Arthritis    lower back  . Breast cancer ([place])   . Breast cancer of upper-outer quadrant of left female breast ([place]) 01/03/2016  . Complication of anesthesia    hx. of combativeness as child; had severe migraine headache day after back surgery  . Cough due to ACE inhibitor 01/28/2016  . Depression   . Eczema    right hip  . High cholesterol   . History of radiation therapy 05/29/16- 07/14/16   Left Breast/ 50.4 Gy n 25 fractions, Left Breast Boost/ 10 Gy in 5 fractions.   . Hypertension    states under control with meds., has been on med. since age 72  . Lymphocytic colitis   . Migraines   . Personal history of radiation therapy   . PONV (postoperative nausea and vomiting)   . Sciatica   . Sinus drainage 01/28/2016  . Urinary urgency     Patient Active Problem List   Diagnosis Date Noted  . FH: colon cancer 04/04/2017  . Cellulitis of breast   . Septic shock ([place])   . Breast cancer of upper-outer quadrant of left female breast ([place]) 01/03/2016  . 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  Spondylolisthesis of lumbar region 04/28/2013    Class: Chronic  . Dehydration 08/09/2011  . Hypertension 08/09/2011  . Hyperlipidemia LDL goal < 100 08/09/2011  . Lymphocytic colitis 08/09/2011  . TIA (transient ischemic attack) 08/09/2011  . ANXIETY 02/24/2010  . DEPRESSION 02/24/2010  . FLATULENCE-GAS-BLOATING 02/07/2008  . OTH&UNSPEC NONINFECTIOUS GASTROENTERITIS&COLITIS 12/12/2007  . DIARRHEA 10/10/2007  . ABDOMINAL PAIN, GENERALIZED 10/10/2007    Past Surgical History:  Procedure Laterality Date  . BREAST BIOPSY    . BREAST LUMPECTOMY     left 2017  . BREAST LUMPECTOMY WITH NEEDLE LOCALIZATION AND AXILLARY SENTINEL LYMPH NODE BX Left 02/04/2016   Procedure: LEFT BREAST LUMPECTOMY WITH 2 NEEDLE LOCALIZATIONS AND LEFT AXILLARY SENTINEL LYMPH NODE BIOPSY;  Surgeon: [name], MD;  Location: [facility];  Service: General;  Laterality: Left;  LEFT BREAST LUMPECTOMY WITH 2 NEEDLE LOCALIZATIONS AND LEFT AXILLARY SENTINEL LYMPH NODE BIOPSY  . [place]  . COLONOSCOPY     Hx: of  . CYSTO  09/29/2005   with anterior repair  . ENDOMETRIAL ABLATION  09/29/2005  . INCISION AND DRAINAGE ABSCESS Left 02/21/2016   Procedure: INCISION AND DRAINAGE ABSCESS;  Surgeon: Excell Seltzer, MD;  Location: [facility];  Service: General;  Laterality: Left;  . INCONTINENCE SURGERY  09/29/2005  . KNEE ARTHROSCOPY W/ DEBRIDEMENT    . LUMBAR FUSION  04/28/2013   L3-4  . TONSILLECTOMY     age 7  . TUBAL LIGATION    . WISDOM TOOTH EXTRACTION     as a teenager    OB History    Gravida Para Term Preterm AB Living   3 2 2   1 2    SAB TAB Ectopic Multiple Live Births   1               Home Medications    Prior to Admission medications   Medication Sig Start Date End Date Taking? Authorizing Provider  amitriptyline (ELAVIL) 25 MG tablet Take 25 mg by mouth at bedtime.    [provider]  amLODipine (NORVASC) 5 MG tablet Take 5 mg by mouth daily.     [provider]  aspirin-acetaminophen-caffeine (EXCEDRIN MIGRAINE) 5852025136 MG tablet Take 2 tablets by mouth every 6 (six) hours as needed for headache.    [provider]  atorvastatin (LIPITOR) 20 MG tablet Take 20 mg by mouth daily.    [provider]  budesonide (ENTOCORT EC) 3 MG 24 hr capsule Take 3 capsules (9 mg total) by mouth daily. 04/11/17   Zehr, Laban Emperor, PA-C  cholecalciferol (VITAMIN D) 1000 units tablet Take 1,000 Units by mouth daily.    [provider]  cyclobenzaprine (FLEXERIL) 10 MG tablet Take 1 tablet (10 mg total) by mouth 3 (three) times daily as needed for muscle spasms. 06/17/17   Jola Schmidt, MD  ibuprofen (ADVIL,MOTRIN) 600 MG tablet Take 1 tablet (600 mg total) by mouth every 8 (eight) hours as needed. 06/17/17   Jola Schmidt, MD  letrozole [facility]) 2.5 MG tablet Take 1 tablet (2.5 mg total) by mouth daily. 09/22/16   Gardenia Phlegm, NP  lisinopril-hydrochlorothiazide (PRINZIDE,ZESTORETIC) 20-12.5 MG tablet Take 1 tablet by mouth daily.    [provider]  methocarbamol (ROBAXIN) 500 MG tablet Take 1 tablet (500 mg total) by mouth daily. 12/06/16   Nicholas Lose, MD  Multiple Vitamins-Minerals (MULTIVITAMIN ADULT PO) Take 1 tablet by mouth daily.     [provider]  oxyCODONE-acetaminophen (PERCOCET/ROXICET) 5-325 MG tablet Take 1 tablet by mouth every 4 (four) hours as needed for severe pain. 06/20/17   Nicholas Lose, MD  Probiotic Product (PROBIOTIC DAILY PO) Take 1 capsule by mouth daily.     [provider]  sulfamethoxazole-trimethoprim (BACTRIM DS) 800-160 MG tablet Take 1 tablet by mouth 2 (two) times daily for 10 days. 07/01/17 07/11/17  Zigmund Gottron, NP  traMADol (ULTRAM) 50 MG tablet Take 1 tablet (50 mg total) by mouth every 6 (six) hours as needed. 06/17/17   Jola Schmidt, MD    Family History Family History  Problem Relation Age of Onset  . Colon cancer Father        4-65  .  Hypertension Father   . Colon polyps Unknown   . Diabetes Unknown   . Breast cancer Paternal Aunt     Social History Social History   Tobacco Use  . Smoking status: Current Every Day Smoker    Packs/day: 1.00    Years: 12.00    Pack years: 12.00    Types: Cigarettes  . Smokeless tobacco: Never Used  . Tobacco comment: Tobacco info given 04/04/17  Substance Use Topics  . Alcohol use: No  . Drug use: No     Allergies   Soap; Adhesive [tape]; Augmentin [  amoxicillin-pot clavulanate]; Cymbalta [duloxetine hcl]; Latex; Nsaids; Oxycodone; Chlorhexidine gluconate; and Mupirocin   Review of Systems Review of Systems   Physical Exam Triage Vital Signs ED Triage Vitals  Enc Vitals Group     BP 07/01/17 1307 (!) 156/107     Pulse Rate 07/01/17 1307 98     Resp --      Temp 07/01/17 1307 98.8 F (37.1 C)     Temp Source 07/01/17 1307 Oral     SpO2 07/01/17 1307 97 %     Weight --      Height --      Head Circumference --      Peak Flow --      Pain Score 07/01/17 1310 8     Pain Loc --      Pain Edu? --      Excl. in [place]? --    No data found.  Updated Vital Signs BP (!) 156/107 (BP Location: Right Arm)   Pulse 98   Temp 98.8 F (37.1 C) (Oral)   SpO2 97%   Visual Acuity Right Eye Distance:   Left Eye Distance:   Bilateral Distance:    Right Eye Near:   Left Eye Near:    Bilateral Near:     Physical Exam  Constitutional: She is oriented to person, place, and time. She appears well-developed and well-nourished. No distress.  Cardiovascular: Normal rate, regular rhythm and normal heart sounds.  Pulmonary/Chest: Effort normal and breath sounds normal.  Neurological: She is alert and oriented to person, place, and time.  Skin: Skin is warm and dry.     Area of approximately 3 cm in length of firm tender warm redness; small head noted but without underlying fluctuance or palpable abscess; firmness to tissue; see photo        UC Treatments / Results    Labs (all labs ordered are listed, but only abnormal results are displayed) Labs Reviewed - No data to display  EKG  EKG Interpretation None       Radiology No results found.  Procedures Procedures (including critical care time)  Medications Ordered in UC Medications - No data to display   Initial Impression / Assessment and Plan / UC Course  I have reviewed the triage vital signs and the nursing notes.  Pertinent labs & imaging results that were available during my care of the patient were reviewed by me and considered in my medical decision making (see chart for details).     Without palpable fluctuance, deferred incision at this time without definitive abscess presence despite small head noted. Antibiotics initiated. Warm compresses. Encouraged close follow up here or with PCP for recheck and reevaluation for prompt incision if indicated. Return precautions provided. Patient verbalized understanding and agreeable to plan.    Final Clinical Impressions(s) / UC Diagnoses   Final diagnoses:  Cellulitis of neck    ED Discharge Orders        Ordered    sulfamethoxazole-trimethoprim (BACTRIM DS) 800-160 MG tablet  2 times daily     07/01/17 1325       Controlled Substance Prescriptions [place] Controlled Substance Registry consulted? Not Applicable   Zigmund Gottron, NP 07/01/17 1339

## 2017-07-02 ENCOUNTER — Ambulatory Visit
Admission: RE | Admit: 2017-07-02 | Discharge: 2017-07-02 | Disposition: A | Discharge status: Home / Self Care | Payer: Medicaid Other | Source: Ambulatory Visit | Attending: Orthopaedic Surgery | Admitting: Orthopaedic Surgery

## 2017-07-02 DIAGNOSIS — M5416 Radiculopathy, lumbar region: Secondary | ICD-10-CM

## 2017-07-10 ENCOUNTER — Other Ambulatory Visit: Payer: Self-pay

## 2017-07-10 ENCOUNTER — Encounter (HOSPITAL_COMMUNITY): Payer: Self-pay | Admitting: Emergency Medicine

## 2017-07-10 ENCOUNTER — Emergency Department (HOSPITAL_COMMUNITY): Payer: Medicaid Other

## 2017-07-10 ENCOUNTER — Inpatient Hospital Stay (HOSPITAL_COMMUNITY)
Admission: EM | Admit: 2017-07-10 | Discharge: 2017-07-14 | DRG: 454 | Disposition: A | Discharge status: Home Health | Payer: Medicaid Other | Attending: Internal Medicine | Admitting: Internal Medicine

## 2017-07-10 DIAGNOSIS — Z8249 Family history of ischemic heart disease and other diseases of the circulatory system: Secondary | ICD-10-CM

## 2017-07-10 DIAGNOSIS — B9562 Methicillin resistant Staphylococcus aureus infection as the cause of diseases classified elsewhere: Secondary | ICD-10-CM | POA: Diagnosis present

## 2017-07-10 DIAGNOSIS — Z880 Allergy status to penicillin: Secondary | ICD-10-CM

## 2017-07-10 DIAGNOSIS — Z888 Allergy status to other drugs, medicaments and biological substances status: Secondary | ICD-10-CM

## 2017-07-10 DIAGNOSIS — Z885 Allergy status to narcotic agent status: Secondary | ICD-10-CM

## 2017-07-10 DIAGNOSIS — I700 Atherosclerosis of aorta: Secondary | ICD-10-CM | POA: Diagnosis present

## 2017-07-10 DIAGNOSIS — Z853 Personal history of malignant neoplasm of breast: Secondary | ICD-10-CM

## 2017-07-10 DIAGNOSIS — K52832 Lymphocytic colitis: Secondary | ICD-10-CM | POA: Diagnosis present

## 2017-07-10 DIAGNOSIS — Z6836 Body mass index (BMI) 36.0-36.9, adult: Secondary | ICD-10-CM

## 2017-07-10 DIAGNOSIS — I10 Essential (primary) hypertension: Secondary | ICD-10-CM | POA: Diagnosis present

## 2017-07-10 DIAGNOSIS — I959 Hypotension, unspecified: Secondary | ICD-10-CM

## 2017-07-10 DIAGNOSIS — I701 Atherosclerosis of renal artery: Secondary | ICD-10-CM | POA: Diagnosis present

## 2017-07-10 DIAGNOSIS — M4186 Other forms of scoliosis, lumbar region: Secondary | ICD-10-CM | POA: Diagnosis present

## 2017-07-10 DIAGNOSIS — Z803 Family history of malignant neoplasm of breast: Secondary | ICD-10-CM

## 2017-07-10 DIAGNOSIS — M5410 Radiculopathy, site unspecified: Secondary | ICD-10-CM

## 2017-07-10 DIAGNOSIS — E7800 Pure hypercholesterolemia, unspecified: Secondary | ICD-10-CM | POA: Diagnosis present

## 2017-07-10 DIAGNOSIS — I739 Peripheral vascular disease, unspecified: Secondary | ICD-10-CM | POA: Diagnosis present

## 2017-07-10 DIAGNOSIS — Z9851 Tubal ligation status: Secondary | ICD-10-CM

## 2017-07-10 DIAGNOSIS — L03221 Cellulitis of neck: Secondary | ICD-10-CM | POA: Diagnosis present

## 2017-07-10 DIAGNOSIS — M48062 Spinal stenosis, lumbar region with neurogenic claudication: Principal | ICD-10-CM | POA: Diagnosis present

## 2017-07-10 DIAGNOSIS — Z981 Arthrodesis status: Secondary | ICD-10-CM

## 2017-07-10 DIAGNOSIS — I952 Hypotension due to drugs: Secondary | ICD-10-CM | POA: Diagnosis present

## 2017-07-10 DIAGNOSIS — Z91048 Other nonmedicinal substance allergy status: Secondary | ICD-10-CM

## 2017-07-10 DIAGNOSIS — C50412 Malignant neoplasm of upper-outer quadrant of left female breast: Secondary | ICD-10-CM | POA: Diagnosis present

## 2017-07-10 DIAGNOSIS — L0211 Cutaneous abscess of neck: Secondary | ICD-10-CM | POA: Diagnosis present

## 2017-07-10 DIAGNOSIS — T502X5A Adverse effect of carbonic-anhydrase inhibitors, benzothiadiazides and other diuretics, initial encounter: Secondary | ICD-10-CM | POA: Diagnosis present

## 2017-07-10 DIAGNOSIS — Z923 Personal history of irradiation: Secondary | ICD-10-CM

## 2017-07-10 DIAGNOSIS — E669 Obesity, unspecified: Secondary | ICD-10-CM | POA: Diagnosis present

## 2017-07-10 DIAGNOSIS — Z881 Allergy status to other antibiotic agents status: Secondary | ICD-10-CM

## 2017-07-10 DIAGNOSIS — K449 Diaphragmatic hernia without obstruction or gangrene: Secondary | ICD-10-CM | POA: Diagnosis present

## 2017-07-10 DIAGNOSIS — Z79811 Long term (current) use of aromatase inhibitors: Secondary | ICD-10-CM

## 2017-07-10 DIAGNOSIS — Z800 Family history of malignant neoplasm of digestive organs: Secondary | ICD-10-CM

## 2017-07-10 DIAGNOSIS — E860 Dehydration: Secondary | ICD-10-CM | POA: Diagnosis present

## 2017-07-10 DIAGNOSIS — Z419 Encounter for procedure for purposes other than remedying health state, unspecified: Secondary | ICD-10-CM

## 2017-07-10 DIAGNOSIS — Z91040 Latex allergy status: Secondary | ICD-10-CM

## 2017-07-10 DIAGNOSIS — R000 Tachycardia, unspecified: Secondary | ICD-10-CM | POA: Diagnosis not present

## 2017-07-10 DIAGNOSIS — F17210 Nicotine dependence, cigarettes, uncomplicated: Secondary | ICD-10-CM | POA: Diagnosis present

## 2017-07-10 DIAGNOSIS — T50905A Adverse effect of unspecified drugs, medicaments and biological substances, initial encounter: Secondary | ICD-10-CM | POA: Diagnosis present

## 2017-07-10 DIAGNOSIS — M5416 Radiculopathy, lumbar region: Secondary | ICD-10-CM | POA: Diagnosis present

## 2017-07-10 DIAGNOSIS — Y92009 Unspecified place in unspecified non-institutional (private) residence as the place of occurrence of the external cause: Secondary | ICD-10-CM

## 2017-07-10 DIAGNOSIS — E871 Hypo-osmolality and hyponatremia: Secondary | ICD-10-CM | POA: Diagnosis present

## 2017-07-10 LAB — COMPREHENSIVE METABOLIC PANEL
ALK PHOS: 105 U/L (ref 38–126)
ALT: 43 U/L (ref 14–54)
ANION GAP: 11 (ref 5–15)
AST: 31 U/L (ref 15–41)
Albumin: 3.3 g/dL — ABNORMAL LOW (ref 3.5–5.0)
BUN: 10 mg/dL (ref 6–20)
CO2: 25 mmol/L (ref 22–32)
CREATININE: 1.08 mg/dL — AB (ref 0.44–1.00)
Calcium: 9.1 mg/dL (ref 8.9–10.3)
Chloride: 96 mmol/L — ABNORMAL LOW (ref 101–111)
GFR, EST NON AFRICAN AMERICAN: 57 mL/min — AB (ref >60)
Glucose, Bld: 130 mg/dL — ABNORMAL HIGH (ref 65–99)
Potassium: 4 mmol/L (ref 3.5–5.1)
Sodium: 132 mmol/L — ABNORMAL LOW (ref 135–145)
Total Bilirubin: 0.3 mg/dL (ref 0.3–1.2)
Total Protein: 6 g/dL — ABNORMAL LOW (ref 6.5–8.1)

## 2017-07-10 LAB — CBC
HCT: 42 % (ref 36.0–46.0)
HEMOGLOBIN: 14.3 g/dL (ref 12.0–15.0)
MCH: 31 pg (ref 26.0–34.0)
MCHC: 34 g/dL (ref 30.0–36.0)
MCV: 91.1 fL (ref 78.0–100.0)
PLATELETS: 242 10*3/uL (ref 150–400)
RBC: 4.61 MIL/uL (ref 3.87–5.11)
RDW: 14.2 % (ref 11.5–15.5)
WBC: 9.5 10*3/uL (ref 4.0–10.5)

## 2017-07-10 LAB — I-STAT CG4 LACTIC ACID, ED: LACTIC ACID, VENOUS: 1.2 mmol/L (ref 0.5–1.9)

## 2017-07-10 MED ORDER — LOSARTAN POTASSIUM 50 MG PO TABS: 100.0000 mg | ORAL_TABLET | Freq: Every day | ORAL | Status: DC

## 2017-07-10 MED ORDER — ONDANSETRON HCL 4 MG/2ML IJ SOLN
4.0000 mg | Freq: Once | INTRAMUSCULAR | Status: AC
  Administered 2017-07-10: 4 mg via INTRAVENOUS
  Filled 2017-07-10: qty 2

## 2017-07-10 MED ORDER — BUDESONIDE 3 MG PO CPEP
9.0000 mg | ORAL_CAPSULE | Freq: Every day | ORAL | Status: DC
  Administered 2017-07-10 – 2017-07-11 (×2): 9 mg via ORAL
  Filled 2017-07-10 (×4): qty 3

## 2017-07-10 MED ORDER — HYDROCHLOROTHIAZIDE 12.5 MG PO CAPS: 12.5000 mg | ORAL_CAPSULE | Freq: Every day | ORAL | Status: DC

## 2017-07-10 MED ORDER — LETROZOLE 2.5 MG PO TABS
2.5000 mg | ORAL_TABLET | Freq: Every day | ORAL | Status: DC
  Administered 2017-07-10 – 2017-07-14 (×5): 2.5 mg via ORAL
  Filled 2017-07-10 (×5): qty 1

## 2017-07-10 MED ORDER — ASPIRIN-ACETAMINOPHEN-CAFFEINE 250-250-65 MG PO TABS
2.0000 | ORAL_TABLET | Freq: Four times a day (QID) | ORAL | Status: DC | PRN
  Filled 2017-07-10: qty 2

## 2017-07-10 MED ORDER — OXYCODONE-ACETAMINOPHEN 5-325 MG PO TABS
1.0000 | ORAL_TABLET | Freq: Once | ORAL | Status: AC
  Administered 2017-07-10: 1 via ORAL
  Filled 2017-07-10: qty 1

## 2017-07-10 MED ORDER — SODIUM CHLORIDE 0.9 % IV SOLN
INTRAVENOUS | Status: AC
  Administered 2017-07-10: 500 mL via INTRAVENOUS
  Administered 2017-07-11: 1000 mL via INTRAVENOUS

## 2017-07-10 MED ORDER — IOPAMIDOL (ISOVUE-300) INJECTION 61%
INTRAVENOUS | Status: AC
  Filled 2017-07-10: qty 75

## 2017-07-10 MED ORDER — OXYCODONE-ACETAMINOPHEN 5-325 MG PO TABS
1.0000 | ORAL_TABLET | ORAL | Status: DC | PRN
  Administered 2017-07-10 – 2017-07-11 (×2): 1 via ORAL
  Filled 2017-07-10 (×2): qty 1

## 2017-07-10 MED ORDER — SODIUM CHLORIDE 0.9% FLUSH
3.0000 mL | Freq: Two times a day (BID) | INTRAVENOUS | Status: DC
  Administered 2017-07-10 – 2017-07-13 (×3): 3 mL via INTRAVENOUS

## 2017-07-10 MED ORDER — ONDANSETRON HCL 4 MG PO TABS
4.0000 mg | ORAL_TABLET | Freq: Four times a day (QID) | ORAL | Status: DC | PRN
  Administered 2017-07-11 – 2017-07-14 (×7): 4 mg via ORAL
  Filled 2017-07-10 (×7): qty 1

## 2017-07-10 MED ORDER — ADULT MULTIVITAMIN W/MINERALS CH
1.0000 | ORAL_TABLET | Freq: Every day | ORAL | Status: DC
  Administered 2017-07-10 – 2017-07-14 (×5): 1 via ORAL
  Filled 2017-07-10 (×4): qty 1

## 2017-07-10 MED ORDER — LOSARTAN POTASSIUM-HCTZ 100-12.5 MG PO TABS: 1.0000 | ORAL_TABLET | Freq: Every day | ORAL | Status: DC

## 2017-07-10 MED ORDER — VANCOMYCIN HCL 10 G IV SOLR
2000.0000 mg | Freq: Once | INTRAVENOUS | Status: AC
  Administered 2017-07-10: 2000 mg via INTRAVENOUS
  Filled 2017-07-10: qty 2000

## 2017-07-10 MED ORDER — RISAQUAD PO CAPS
1.0000 | ORAL_CAPSULE | Freq: Every day | ORAL | Status: DC
  Administered 2017-07-10 – 2017-07-14 (×5): 1 via ORAL
  Filled 2017-07-10 (×5): qty 1

## 2017-07-10 MED ORDER — ATORVASTATIN CALCIUM 10 MG PO TABS
20.0000 mg | ORAL_TABLET | Freq: Every day | ORAL | Status: DC
  Administered 2017-07-10 – 2017-07-14 (×5): 20 mg via ORAL
  Filled 2017-07-10 (×6): qty 2

## 2017-07-10 MED ORDER — SODIUM CHLORIDE 0.9 % IV BOLUS (SEPSIS)
1000.0000 mL | Freq: Once | INTRAVENOUS | Status: AC
  Administered 2017-07-10: 1000 mL via INTRAVENOUS

## 2017-07-10 MED ORDER — ONDANSETRON HCL 4 MG/2ML IJ SOLN
4.0000 mg | Freq: Four times a day (QID) | INTRAMUSCULAR | Status: DC | PRN
  Administered 2017-07-10 – 2017-07-12 (×4): 4 mg via INTRAVENOUS
  Filled 2017-07-10 (×4): qty 2

## 2017-07-10 MED ORDER — AMITRIPTYLINE HCL 25 MG PO TABS
25.0000 mg | ORAL_TABLET | Freq: Every day | ORAL | Status: DC
  Administered 2017-07-10 – 2017-07-13 (×4): 25 mg via ORAL
  Filled 2017-07-10 (×5): qty 1

## 2017-07-10 MED ORDER — VANCOMYCIN HCL IN DEXTROSE 750-5 MG/150ML-% IV SOLN
750.0000 mg | Freq: Two times a day (BID) | INTRAVENOUS | Status: DC
Start: 2017-07-11 — End: 2017-07-14
  Administered 2017-07-11 – 2017-07-13 (×4): 750 mg via INTRAVENOUS
  Filled 2017-07-10 (×8): qty 150

## 2017-07-10 MED ORDER — SODIUM CHLORIDE 0.9 % IV SOLN: 500.0000 mL | INTRAVENOUS | Status: DC

## 2017-07-10 MED ORDER — POLYETHYLENE GLYCOL 3350 17 G PO PACK: 17.0000 g | PACK | Freq: Every day | ORAL | Status: DC | PRN

## 2017-07-10 MED ORDER — IOPAMIDOL (ISOVUE-370) INJECTION 76%
INTRAVENOUS | Status: AC
  Administered 2017-07-10: 100 mL
  Filled 2017-07-10: qty 100

## 2017-07-10 MED ORDER — ACETAMINOPHEN 325 MG PO TABS
650.0000 mg | ORAL_TABLET | Freq: Once | ORAL | Status: AC
  Administered 2017-07-10: 650 mg via ORAL
  Filled 2017-07-10: qty 2

## 2017-07-10 MED ORDER — VITAMIN D 1000 UNITS PO TABS
1000.0000 [IU] | ORAL_TABLET | Freq: Every day | ORAL | Status: DC
  Administered 2017-07-10 – 2017-07-14 (×5): 1000 [IU] via ORAL
  Filled 2017-07-10 (×5): qty 1

## 2017-07-10 NOTE — ED Notes (Signed)
Pt again goes to CT.

## 2017-07-10 NOTE — Consult Note (Signed)
Surgical Consultation Requesting provider: Dr. Evangeline Gula  CC: neck abscess  HPI: 57yo woman admitted for ongoing lower back pain and radiculopathy with tentative plans for surgery tomorrow. She was noted to have a draining wound on the back of her neck. She had an abscess lanced at an urgent care on 2/27 and was rx'd bactrim. Cultures were positive for MRSA. The wound was initially packed but the packing fell out and the wound has continued to drain. Currently she says it is itchy and a little sore where the opening is but over all better than before the I&D. No fevers. A CT scan was performed which shows persistent fluid and communication between abscess and skin wound. General surgery is consulted for further management.   Allergies  Allergen Reactions  . Soap Shortness Of Breath and Rash    IVORY SOAP  . Adhesive [Tape] Other (See Comments)    BLISTERS  . Augmentin [Amoxicillin-Pot Clavulanate] Nausea And Vomiting    Has patient had a PCN reaction causing immediate rash, facial/tongue/throat swelling, SOB or lightheadedness with hypotension: Unknown Has patient had a PCN reaction causing severe rash involving mucus membranes or skin necrosis: Unknown Has patient had a PCN reaction that required hospitalization Unknown Has patient had a PCN reaction occurring within the last 10 years: Unknown If all of the above answers are "NO", then may proceed with Cephalosporin use.   Marland Kitchen Cymbalta [Duloxetine Hcl] Nausea Only  . Latex Other (See Comments) and Itching    REACTION: blisters BLISTERS  . Nsaids Other (See Comments)    LYMPHOCYTIC COLITIS  . Oxycodone Nausea Only    Not on an empty stomach  . Chlorhexidine Gluconate Rash    Pt says her skin looked like poison ivy  . Mupirocin Rash    Past Medical History:  Diagnosis Date  . Anxiety   . Arthritis    lower back  . Breast cancer ([place])   . Breast cancer of upper-outer quadrant of left female breast ([place]) 01/03/2016  . Complication of  anesthesia    hx. of combativeness as child; had severe migraine headache day after back surgery  . Cough due to ACE inhibitor 01/28/2016  . Depression   . Eczema    right hip  . High cholesterol   . History of radiation therapy 05/29/16- 07/14/16   Left Breast/ 50.4 Gy n 25 fractions, Left Breast Boost/ 10 Gy in 5 fractions.   . Hypertension    states under control with meds., has been on med. since age 19  . Lymphocytic colitis   . Migraines   . Personal history of radiation therapy   . PONV (postoperative nausea and vomiting)   . Sciatica   . Sinus drainage 01/28/2016  . Urinary urgency     Past Surgical History:  Procedure Laterality Date  . BREAST BIOPSY    . BREAST LUMPECTOMY     left 2017  . BREAST LUMPECTOMY WITH NEEDLE LOCALIZATION AND AXILLARY SENTINEL LYMPH NODE BX Left 02/04/2016   Procedure: LEFT BREAST LUMPECTOMY WITH 2 NEEDLE LOCALIZATIONS AND LEFT AXILLARY SENTINEL LYMPH NODE BIOPSY;  Surgeon: [name], MD;  Location: [facility];  Service: General;  Laterality: Left;  LEFT BREAST LUMPECTOMY WITH 2 NEEDLE LOCALIZATIONS AND LEFT AXILLARY SENTINEL LYMPH NODE BIOPSY  . [place]  . COLONOSCOPY     Hx: of  . CYSTO  09/29/2005   with anterior repair  . ENDOMETRIAL ABLATION  09/29/2005  . INCISION AND DRAINAGE  ABSCESS Left 02/21/2016   Procedure: INCISION AND DRAINAGE ABSCESS;  Surgeon: Excell Seltzer, MD;  [facility];  Service: General;  Laterality: Left;  . INCONTINENCE SURGERY  09/29/2005  . KNEE ARTHROSCOPY W/ DEBRIDEMENT    . LUMBAR FUSION  04/28/2013   L3-4  . TONSILLECTOMY     age 68  . TUBAL LIGATION    . WISDOM TOOTH EXTRACTION     as a teenager    Family History  Problem Relation Age of Onset  . Colon cancer Father        19-65  . Hypertension Father   . Colon polyps Unknown   . Diabetes Unknown   . Breast cancer Paternal Aunt     Social History   Socioeconomic History  . Marital status: Divorced     Spouse name: None  . Number of children: 2  . Years of education: None  . Highest education level: None  Social Needs  . Financial resource strain: None  . Food insecurity - worry: None  . Food insecurity - inability: None  . Transportation needs - medical: None  . Transportation needs - non-medical: None  Occupational History  . Occupation: Radiographer, therapeutic: [employer]  Tobacco Use  . Smoking status: Current Every Day Smoker    Packs/day: 1.00    Years: 12.00    Pack years: 12.00    Types: Cigarettes  . Smokeless tobacco: Never Used  . Tobacco comment: Tobacco info given 04/04/17  Substance and Sexual Activity  . Alcohol use: No  . Drug use: No  . Sexual activity: No  Other Topics Concern  . None  Social History Narrative  . None    No current facility-administered medications on file prior to encounter.    Current Outpatient Medications on File Prior to Encounter  Medication Sig Dispense Refill  . amitriptyline (ELAVIL) 25 MG tablet Take 25 mg by mouth at bedtime.    Marland Kitchen amLODipine (NORVASC) 5 MG tablet Take 5 mg by mouth daily.    Marland Kitchen aspirin-acetaminophen-caffeine (EXCEDRIN MIGRAINE) 250-250-65 MG tablet Take 2 tablets by mouth every 6 (six) hours as needed for headache.    Marland Kitchen atorvastatin (LIPITOR) 20 MG tablet Take 20 mg by mouth daily.    . budesonide (ENTOCORT EC) 3 MG 24 hr capsule Take 3 capsules (9 mg total) by mouth daily. 90 capsule 3  . cholecalciferol (VITAMIN D) 1000 units tablet Take 1,000 Units by mouth daily.    Marland Kitchen letrozole (FEMARA) 2.5 MG tablet Take 1 tablet (2.5 mg total) by mouth daily. 30 tablet 5  . losartan-hydrochlorothiazide (HYZAAR) 100-12.5 MG tablet Take 1 tablet by mouth daily.    . Multiple Vitamins-Minerals (MULTIVITAMIN ADULT PO) Take 1 tablet by mouth daily.     Marland Kitchen oxyCODONE-acetaminophen (PERCOCET/ROXICET) 5-325 MG tablet Take 1 tablet by mouth every 4 (four) hours as needed for severe pain. 30 tablet 0  . Probiotic Product (PROBIOTIC  DAILY PO) Take 1 capsule by mouth daily.     Marland Kitchen sulfamethoxazole-trimethoprim (BACTRIM DS) 800-160 MG tablet Take 1 tablet by mouth 2 (two) times daily for 10 days. 20 tablet 0    Review of Systems: a complete, 10pt review of systems was completed with pertinent positives and negatives as documented in the HPI  Physical Exam: Vitals:   07/10/17 2015 07/10/17 2103  BP: 101/68 99/61  Pulse: 85 82  Resp: 11 15  Temp:  98 F (36.7 C)  SpO2: 97% 99%   Gen: A&Ox3,  no distress  Head: normocephalic, atraumatic Eyes: extraocular motions intact, anicteric.  Neck: supple without mass or thyromegaly Chest: unlabored respirations, symmetrical air entry, clear bilaterally   Cardiovascular: RRR with palpable distal pulses, no pedal edema Abdomen:soft, nondistended, nontender. No mass or organomegaly.  Extremities: warm, without edema, no deformities Psych: appropriate mood and affect, normal insight  Skin: warm and dry Neck: On the posterior neck near midline there is a 46mm wound with 1-2cm radius of surrounding erythema. Compression of the area expresses thick purulent fluid. It is mildly tender with pressure.    CBC Latest Ref Rng & Units 07/10/2017 02/25/2016 02/24/2016  WBC 4.0 - 10.5 K/uL 9.5 17.3(H) 19.9(H)  Hemoglobin 12.0 - 15.0 g/dL 14.3 12.6 11.3(L)  Hematocrit 36.0 - 46.0 % 42.0 37.8 33.6(L)  Platelets 150 - 400 K/uL 242 303 288    CMP Latest Ref Rng & Units 07/10/2017 02/23/2016 02/22/2016  Glucose 65 - 99 mg/dL 130(H) 230(H) 190(H)  BUN 6 - 20 mg/dL 10 14 18   Creatinine 0.44 - 1.00 mg/dL 1.08(H) 0.86 1.13(H)  Sodium 135 - 145 mmol/L 132(L) 137 136  Potassium 3.5 - 5.1 mmol/L 4.0 4.7 4.1  Chloride 101 - 111 mmol/L 96(L) 108 110  CO2 22 - 32 mmol/L 25 25 21(L)  Calcium 8.9 - 10.3 mg/dL 9.1 8.6(L) 7.4(L)  Total Protein 6.5 - 8.1 g/dL 6.0(L) - -  Total Bilirubin 0.3 - 1.2 mg/dL 0.3 - -  Alkaline Phos 38 - 126 U/L 105 - -  AST 15 - 41 U/L 31 - -  ALT 14 - 54 U/L 43 - -    Lab  Results  Component Value Date   INR 0.92 08/08/2011    Imaging: Ct Soft Tissue Neck W Contrast  Result Date: 07/10/2017 CLINICAL DATA:  57 y/o  F; hypotension.  Cellulitis of neck. EXAM: CT NECK WITH CONTRAST TECHNIQUE: Multidetector CT imaging of the neck was performed using the standard protocol following the bolus administration of intravenous contrast. CONTRAST:  100 cc Isovue 370 COMPARISON:  None. FINDINGS: Pharynx and larynx: Normal. No mass or swelling. Salivary glands: No inflammation, mass, or stone. Thyroid: Nodule in right lobe of thyroid measuring 22 mm. Lymph nodes: None enlarged or abnormal density. Vascular: Mixed plaque of left carotid bifurcation with mild 50% proximal ICA stenosis. Several segments of fibrofatty plaque within common carotid arteries without high-grade stenosis. Limited intracranial: Negative. Visualized orbits: Negative. Mastoids and visualized paranasal sinuses: Mild ethmoid and maxillary sinus mucosal thickening. Normal aeration of mastoid air cells. Skeleton: Mild cervical spondylosis with multilevel disc and facet degenerative changes. Uncovertebral and facet hypertrophy results in multiple left-sided narrowing of bony neural foramen. Upper chest: Negative. Other: Multiloculated abscess centered within the posterior lower neck subcutaneous fat measuring 1.5 x 4.1 x 3.0 cm in conglomerate (AP x ML x CC series 1, image 39 and series 6, image 39). There is a sinus tract extending to the skin surface. Surrounding inflammation is present within subcutaneous fat. The abscess does not extend into the deep cervical soft tissues. IMPRESSION: Multiloculated abscess centered within posterior lower neck subcutaneous fat with sinus tract to skin surface measuring up to 4.1 cm. Electronically Signed   By: Kristine Garbe M.D.   On: 07/10/2017 18:27   Ct Angio Chest/abd/pel For Dissection W And/or Wo Contrast  Result Date: 07/10/2017 CLINICAL DATA:  Hypotension.  Neck  cellulitis. EXAM: CT ANGIOGRAPHY CHEST, ABDOMEN AND PELVIS TECHNIQUE: Multidetector CT imaging through the chest, abdomen and pelvis was performed using the  standard protocol during bolus administration of intravenous contrast. Multiplanar reconstructed images and MIPs were obtained and reviewed to evaluate the vascular anatomy. CONTRAST:  100 mL Isovue 370 COMPARISON:  CTs of the lumbar spine and right hip 06/17/2017 FINDINGS: CTA CHEST FINDINGS Cardiovascular: There is no evidence of thoracic aortic intramural hematoma on precontrast images. The thoracic aorta is normal in caliber without aneurysm or dissection. The heart is normal in size. There is lipomatous hypertrophy of the interatrial septum. There is no pericardial effusion. No central pulmonary emboli are identified on this nondedicated study. Mediastinum/Nodes: Heterogeneity of the right greater than left thyroid lobes with assessment limited by streak artifact from venous contrast. No enlarged axillary or mediastinal lymph nodes. Subcentimeter hilar lymph nodes bilaterally, slightly more prominent on the right. Small sliding hiatal hernia. Lungs/Pleura: No pleural effusion or pneumothorax. Evaluation of the lung parenchyma is mildly limited by respiratory motion artifact. There is mild bronchial wall thickening bilaterally and minimal dependent atelectasis bilaterally. No mass. Musculoskeletal: No acute osseous abnormality or suspicious osseous lesion. Review of the MIP images confirms the above findings. CTA ABDOMEN AND PELVIS FINDINGS VASCULAR Aorta: Patent with mild diffuse atherosclerotic irregularity. No aneurysm or dissection. Celiac: Patent without evidence of aneurysm, dissection, vasculitis or significant stenosis. Accessory right hepatic artery arises from the left gastric artery, a normal variant. SMA: Patent with less than 50% narrowing proximally.  No dissection. Renals: Single renal arteries bilaterally are patent without evidence of  dissection. 65% stenosis of the proximal right main renal artery. IMA: Patent without evidence of aneurysm, dissection, vasculitis or significant stenosis. Inflow: Patent with mild atherosclerosis bilaterally. No significant stenosis or dissection. Veins: No obvious venous abnormality within the limitations of this arterial phase study. Review of the MIP images confirms the above findings. NON-VASCULAR Hepatobiliary: No focal liver abnormality is seen. No gallstones, gallbladder wall thickening, or biliary dilatation. Pancreas: Unremarkable. Spleen: Unremarkable. Adrenals/Urinary Tract: Unremarkable adrenal glands. No hydronephrosis. Subcentimeter right renal hypodensity, too small to fully characterize. Unremarkable bladder. Stomach/Bowel: No bowel obstruction or gross wall thickening. Appendix not clearly visualized, however no inflammatory changes are seen to suggest appendicitis. Lymphatic: No enlarged lymph nodes. Reproductive: Uterus and bilateral adnexa are unremarkable. Other: No intraperitoneal free fluid.  No abdominal wall hernia. Musculoskeletal: Prior lumbar fusion with advanced disc degeneration above and below the fused segment. Mild-to-moderate lumbar levoscoliosis. Review of the MIP images confirms the above findings. IMPRESSION: 1. No aortic aneurysm or dissection. Aortic Atherosclerosis (ICD10-I70.0). 2. 65% right renal artery stenosis. 3. Bronchial wall thickening. 4. Small hiatal hernia. Electronically Signed   By: Logan Bores M.D.   On: 07/10/2017 18:43      A/P: 57yo woman who presented to the ER with back pain at the direction of her spine surgeon for pain control, admission and surgery tomorrow. Ongoing drainage from I&D site with persistent fluid on CT. Symptomatically improving. No fever or leukocytosis. Hypotension in ER seems to have been spurious. No need for urgent intervention at this time.  -Recommend local wound care- nursing to pack wound with 1/4'' packing strips and change  BID.  -Vanc has been started by primary  -Will recheck wound tomorrow.   Romana Juniper, MD [facility], [place] Pager [phone]

## 2017-07-10 NOTE — ED Triage Notes (Signed)
Pt arrives via POV from home with fall on February 10. Followed up with [facility] today, sent here for pain control, admission and surgery for lumbar radiculopathy. Pt awake, alert, appropriate.

## 2017-07-10 NOTE — ED Notes (Signed)
Patient transported to CT 

## 2017-07-10 NOTE — ED Notes (Signed)
Pt returns from ct . IV at right forearmcauses pain with flushing. jno swelling noted

## 2017-07-10 NOTE — ED Notes (Signed)
Pt sitting upright . C/o pain 10/10. Speaks in complete sentences and moves all extremioties.

## 2017-07-10 NOTE — Progress Notes (Signed)
I just spoke with Dr. Thomasene Lot about [name]. She informed me that she was very concerned about [name]'s unexplained hypotension, and that she was close to starting IV vasopressors to address her hypotension. [name] is afebrile and has no signs of infection and she also has a normal WBC count. She has suggested that [name] be admitted to medicine, which I agree with. Of note, [name] has severe right leg pain from her severe nerve compression, and I do suspect that she may have taken more pain medication than what was recommended, but I can't say conclusively that this is why she's having significant hypotension. In any event, if the medical team were to feel that [name] was stable for surgery, I would be willing and able to surgically treat her severe lumbar radiculopathy tomorrow, the details of which was previously explained to the [name]. I will await further input from the medicine team.

## 2017-07-10 NOTE — ED Notes (Signed)
Pt moves to left side lying position for comfort putting right arm in elevated position with bp cuff. No left bp due to breast surg in past.

## 2017-07-10 NOTE — ED Notes (Signed)
Pt given food and water

## 2017-07-10 NOTE — ED Provider Notes (Signed)
[facility] EMERGENCY DEPARTMENT Provider Note   CSN: [id number] Arrival date & time: 07/10/17  1024     History   Chief Complaint Chief Complaint  Patient presents with  . Back Pain    HPI [name] is a 57 y.o. female with a past medical history of hypertension, breast cancer status post radiation therapy completed 2 years ago, who presents to ED for evaluation of ongoing lower back pain for the past several months.  She states that her orthopedic provider sent her to the ED for pain control, admission and surgery for radiculopathy tomorrow.  She states that despite the use of hydrocodone on at home, she continues to have pain.  She also has a history of lumbar fusion surgery several years ago.  Denies any injuries or falls, numbness in legs, loss of bowel or bladder function. Of note, patient also has an abscess to the back of her neck.  She had the area drained approximately 1 week ago and reports compliance with her home Bactrim.  She states that the area looks and feels better than it did previously.  She denies fevers.  She has been changing dressings as directed daily.  HPI  Past Medical History:  Diagnosis Date  . Anxiety   . Arthritis    lower back  . Breast cancer ([place])   . Breast cancer of upper-outer quadrant of left female breast ([place]) 01/03/2016  . Complication of anesthesia    hx. of combativeness as child; had severe migraine headache day after back surgery  . Cough due to ACE inhibitor 01/28/2016  . Depression   . Eczema    right hip  . High cholesterol   . History of radiation therapy 05/29/16- 07/14/16   Left Breast/ 50.4 Gy n 25 fractions, Left Breast Boost/ 10 Gy in 5 fractions.   . Hypertension    states under control with meds., has been on med. since age 31  . Lymphocytic colitis   . Migraines   . Personal history of radiation therapy   . PONV (postoperative nausea and vomiting)   . Sciatica   . Sinus drainage 01/28/2016  .  Urinary urgency     Patient Active Problem List   Diagnosis Date Noted  . FH: colon cancer 04/04/2017  . Cellulitis of breast   . Septic shock ([place])   . Breast cancer of upper-outer quadrant of left female breast ([place]) 01/03/2016  . 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 Spondylolisthesis of lumbar region 04/28/2013    Class: Chronic  . Dehydration 08/09/2011  . Hypertension 08/09/2011  . Hyperlipidemia LDL goal < 100 08/09/2011  . Lymphocytic colitis 08/09/2011  . TIA (transient ischemic attack) 08/09/2011  . ANXIETY 02/24/2010  . DEPRESSION 02/24/2010  . FLATULENCE-GAS-BLOATING 02/07/2008  . OTH&UNSPEC NONINFECTIOUS GASTROENTERITIS&COLITIS 12/12/2007  . DIARRHEA 10/10/2007  . ABDOMINAL PAIN, GENERALIZED 10/10/2007    Past Surgical History:  Procedure Laterality Date  . BREAST BIOPSY    . BREAST LUMPECTOMY     left 2017  . BREAST LUMPECTOMY WITH NEEDLE LOCALIZATION AND AXILLARY SENTINEL LYMPH NODE BX Left 02/04/2016   Procedure: LEFT BREAST LUMPECTOMY WITH 2 NEEDLE LOCALIZATIONS AND LEFT AXILLARY SENTINEL LYMPH NODE BIOPSY;  Surgeon: [name], MD;  Location: [facility];  Service: General;  Laterality: Left;  LEFT BREAST LUMPECTOMY WITH 2 NEEDLE LOCALIZATIONS AND LEFT AXILLARY SENTINEL LYMPH NODE BIOPSY  . [place]  .  COLONOSCOPY     Hx: of  . CYSTO  09/29/2005   with anterior repair  . ENDOMETRIAL ABLATION  09/29/2005  . INCISION AND DRAINAGE ABSCESS Left 02/21/2016   Procedure: INCISION AND DRAINAGE ABSCESS;  Surgeon: Excell Seltzer, MD;  Location: [facility];  Service: General;  Laterality: Left;  . INCONTINENCE SURGERY  09/29/2005  . KNEE ARTHROSCOPY W/ DEBRIDEMENT    . LUMBAR FUSION  04/28/2013   L3-4  . TONSILLECTOMY     age 81  . TUBAL LIGATION    . WISDOM TOOTH EXTRACTION     as a teenager    OB History    Gravida Para Term Preterm AB Living   3 2 2   1 2    SAB TAB  Ectopic Multiple Live Births   1               Home Medications    Prior to Admission medications   Medication Sig Start Date End Date Taking? Authorizing Provider  amitriptyline (ELAVIL) 25 MG tablet Take 25 mg by mouth at bedtime.    [provider]  amLODipine (NORVASC) 5 MG tablet Take 5 mg by mouth daily.    [provider]  aspirin-acetaminophen-caffeine (EXCEDRIN MIGRAINE) [phone] MG tablet Take 2 tablets by mouth every 6 (six) hours as needed for headache.    [provider]  atorvastatin (LIPITOR) 20 MG tablet Take 20 mg by mouth daily.    [provider]  budesonide (ENTOCORT EC) 3 MG 24 hr capsule Take 3 capsules (9 mg total) by mouth daily. 04/11/17   Zehr, Laban Emperor, PA-C  cholecalciferol (VITAMIN D) 1000 units tablet Take 1,000 Units by mouth daily.    [provider]  cyclobenzaprine (FLEXERIL) 10 MG tablet Take 1 tablet (10 mg total) by mouth 3 (three) times daily as needed for muscle spasms. 06/17/17   Jola Schmidt, MD  ibuprofen (ADVIL,MOTRIN) 600 MG tablet Take 1 tablet (600 mg total) by mouth every 8 (eight) hours as needed. 06/17/17   Jola Schmidt, MD  letrozole [facility]) 2.5 MG tablet Take 1 tablet (2.5 mg total) by mouth daily. 09/22/16   Gardenia Phlegm, NP  lisinopril-hydrochlorothiazide (PRINZIDE,ZESTORETIC) 20-12.5 MG tablet Take 1 tablet by mouth daily.    [provider]  methocarbamol (ROBAXIN) 500 MG tablet Take 1 tablet (500 mg total) by mouth daily. 12/06/16   Nicholas Lose, MD  Multiple Vitamins-Minerals (MULTIVITAMIN ADULT PO) Take 1 tablet by mouth daily.     [provider]  oxyCODONE-acetaminophen (PERCOCET/ROXICET) 5-325 MG tablet Take 1 tablet by mouth every 4 (four) hours as needed for severe pain. 06/20/17   Nicholas Lose, MD  Probiotic Product (PROBIOTIC DAILY PO) Take 1 capsule by mouth daily.     [provider]  sulfamethoxazole-trimethoprim (BACTRIM DS) 800-160 MG  tablet Take 1 tablet by mouth 2 (two) times daily for 10 days. 07/01/17 07/11/17  Zigmund Gottron, NP  traMADol (ULTRAM) 50 MG tablet Take 1 tablet (50 mg total) by mouth every 6 (six) hours as needed. 06/17/17   Jola Schmidt, MD    Family History Family History  Problem Relation Age of Onset  . Colon cancer Father        41-65  . Hypertension Father   . Colon polyps Unknown   . Diabetes Unknown   . Breast cancer Paternal Aunt     Social History Social History   Tobacco Use  . Smoking status: Current Every Day Smoker  Packs/day: 1.00    Years: 12.00    Pack years: 12.00    Types: Cigarettes  . Smokeless tobacco: Never Used  . Tobacco comment: Tobacco info given 04/04/17  Substance Use Topics  . Alcohol use: No  . Drug use: No     Allergies   Soap; Adhesive [tape]; Augmentin [amoxicillin-pot clavulanate]; Cymbalta [duloxetine hcl]; Latex; Nsaids; Oxycodone; Chlorhexidine gluconate; and Mupirocin   Review of Systems Review of Systems  Constitutional: Negative for appetite change, chills and fever.  HENT: Negative for ear pain, rhinorrhea, sneezing and sore throat.   Eyes: Negative for photophobia and visual disturbance.  Respiratory: Negative for cough, chest tightness, shortness of breath and wheezing.   Cardiovascular: Negative for chest pain and palpitations.  Gastrointestinal: Negative for abdominal pain, blood in stool, constipation, diarrhea, nausea and vomiting.  Genitourinary: Negative for dysuria, hematuria and urgency.  Musculoskeletal: Positive for back pain and neck pain. Negative for myalgias.  Skin: Negative for rash.  Neurological: Negative for dizziness, weakness and light-headedness.     Physical Exam Updated Vital Signs BP (!) 89/62 (BP Location: Right Arm)   Pulse 87   Temp 99.1 F (37.3 C) (Oral)   Resp 20   Ht 5\' 7"  (1.702 m)   Wt 104.3 kg (230 lb)   SpO2 95%   BMI 36.02 kg/m   Physical Exam  Constitutional: She appears  well-developed and well-nourished. No distress.  HENT:  Head: Normocephalic and atraumatic.  Nose: Nose normal.  Eyes: Conjunctivae and EOM are normal. Right eye exhibits no discharge. Left eye exhibits no discharge. No scleral icterus.  Neck: Normal range of motion. Neck supple.  Cardiovascular: Normal rate, regular rhythm, normal heart sounds and intact distal pulses. Exam reveals no gallop and no friction rub.  No murmur heard. Pulmonary/Chest: Effort normal and breath sounds normal. No respiratory distress.  Abdominal: Soft. Bowel sounds are normal. She exhibits no distension. There is no tenderness. There is no guarding.  Musculoskeletal: Normal range of motion. She exhibits tenderness. She exhibits no edema.       Back:  No step-off palpated. No visible bruising, edema or temperature change noted. No objective signs of numbness present. No saddle anesthesia. 2+ DP pulses bilaterally. Sensation intact to light touch. Strength 5/5 in bilateral lower extremities.  Neurological: She is alert. She exhibits normal muscle tone. Coordination normal.  Skin: Skin is warm and dry. No rash noted. There is erythema.  Area of erythema to the back of the neck with purulent drainage from previous incision site. Drainage noted to dressing as well.  There is tenderness to palpation of the area.  Psychiatric: She has a normal mood and affect.  Nursing note and vitals reviewed.    ED Treatments / Results  Labs (all labs ordered are listed, but only abnormal results are displayed) Labs Reviewed  COMPREHENSIVE METABOLIC PANEL - Abnormal; Notable for the following components:      Result Value   Sodium 132 (*)    Chloride 96 (*)    Glucose, Bld 130 (*)    Creatinine, Ser 1.08 (*)    Total Protein 6.0 (*)    Albumin 3.3 (*)    GFR calc non Af Amer 57 (*)    All other components within normal limits  CBC  I-STAT CG4 LACTIC ACID, ED    EKG  EKG Interpretation None       Radiology No  results found.  Procedures Procedures (including critical care time)  CRITICAL CARE Performed by:  Mychelle Kendra   Total critical care time: 35 minutes  Critical care time was exclusive of separately billable procedures and treating other patients.  Critical care was necessary to treat or prevent imminent or life-threatening deterioration.  Critical care was time spent personally by me on the following activities: development of treatment plan with patient and/or surrogate as well as nursing, discussions with consultants, evaluation of patient's response to treatment, examination of patient, obtaining history from patient or surrogate, ordering and performing treatments and interventions, ordering and review of laboratory studies, ordering and review of radiographic studies, pulse oximetry and re-evaluation of patient's condition.   Medications Ordered in ED Medications  sodium chloride 0.9 % bolus 1,000 mL (not administered)     Initial Impression / Assessment and Plan / ED Course  I have reviewed the triage vital signs and the nursing notes.  Pertinent labs & imaging results that were available during my care of the patient were reviewed by me and considered in my medical decision making (see chart for details).     Presents to ED for evaluation of back pain.  She was told by her orthopedist that she needed to be admitted for lumbar radiculopathy surgery tomorrow.  She also has an abscess to the back of her neck which was drained approximately 1 week ago.  She is reports compliance with her Bactrim.  On physical exam the area still appears to be draining.  There is overlying erythema and purulent drainage noted on the dressing as well.  Temperature 99.1 here in the ED.  Patient is continuing to complain of lower back pain.  She has been taking hydrocodone to help with pain and sometimes states that she takes 2 pills.  The last pill was approximately 4 hours prior to my evaluation.   Patient is hypotensive at 47-82 systolic.  This could very well be due to her narcotic use however, it has persisted throughout the entire course of her ED visit.  I did order a CT of her neck to rule out any deep infection.  This returned as showing multiple loculated abscesses.  She continued to be hypotensive and got down to 77 systolic even with fluid boluses given to her.  Because of this, CT dissection study was done which returned as negative.  She does not appear to be septic from her neck wound.  I consulted her orthopedist who was ready to take her for her scheduled surgery tomorrow.  However, we do not feel comfortable having patient ready for surgery with blood pressures this low.  I consulted the hospitalist for admission before any surgery could be done due to her low blood pressures. Patient discussed with and seen by Dr. Thomasene Lot.  Portions of this note were generated with [employer]. Dictation errors may occur despite best attempts at proofreading.   Final Clinical Impressions(s) / ED Diagnoses   Final diagnoses:  None    ED Discharge Orders    None       Delia Heady, PA-C 07/10/17 2226    Macarthur Critchley, MD 07/11/17 2024

## 2017-07-10 NOTE — ED Notes (Signed)
Pt returns from crt and insist that she ambulate to br. offeredd bedpan , bedside commode and wc. Refuses all and refuses any asisstance. Returns to room with unsteady gait and holding walls.

## 2017-07-10 NOTE — Progress Notes (Signed)
Pharmacy Antibiotic Note  [name] is a 57 y.o. female admitted on 07/10/2017 with cellulitis.  Pharmacy has been consulted for Vancomycin dosing. Pt is s/p radiation therapy for breast cancer and has an abscess to the back of her neck. She failed outpatient bactrim. WBC wnl. LA 1.2. SCr 1.08 (BL ~ 0.8). nCrCl ~ 65 mL/min    Plan: -Vancomycin 2 gm IV once, then vancomycin 750 mg IV Q 12 hours -Monitor CBC, renal fx, cultures and clinical progress -VT at SS   Height: 5\' 7"  (170.2 cm) Weight: 230 lb (104.3 kg) IBW/kg (Calculated) : 61.6  Temp (24hrs), Avg:99.1 F (37.3 C), Min:99.1 F (37.3 C), Max:99.1 F (37.3 C)  Recent Labs  Lab 07/10/17 1120 07/10/17 1326  WBC 9.5  --   CREATININE 1.08*  --   LATICACIDVEN  --  1.20    Estimated Creatinine Clearance: 73.1 mL/min (A) (by C-G formula based on SCr of 1.08 mg/dL (H)).    Allergies  Allergen Reactions  . Soap Shortness Of Breath and Rash    IVORY SOAP  . Adhesive [Tape] Other (See Comments)    BLISTERS  . Augmentin [Amoxicillin-Pot Clavulanate] Nausea And Vomiting    Has patient had a PCN reaction causing immediate rash, facial/tongue/throat swelling, SOB or lightheadedness with hypotension: Unknown Has patient had a PCN reaction causing severe rash involving mucus membranes or skin necrosis: Unknown Has patient had a PCN reaction that required hospitalization Unknown Has patient had a PCN reaction occurring within the last 10 years: Unknown If all of the above answers are "NO", then may proceed with Cephalosporin use.   Marland Kitchen Cymbalta [Duloxetine Hcl] Nausea Only  . Latex Other (See Comments) and Itching    REACTION: blisters BLISTERS  . Nsaids Other (See Comments)    LYMPHOCYTIC COLITIS  . Oxycodone Nausea Only    Not on an empty stomach  . Chlorhexidine Gluconate Rash    Pt says her skin looked like poison ivy  . Mupirocin Rash    Antimicrobials this admission: Vanc 3/5 >>   Dose adjustments this  admission: None  Microbiology results:   Thank you for allowing pharmacy to be a part of this patient's care.  Albertina Parr, PharmD., BCPS Clinical Pharmacist Clinical phone for 07/10/17 until 11 pm: [phone] If after 11pm, please call main pharmacy at: [phone] 07/09/2017 10:10 AM

## 2017-07-10 NOTE — H&P (Signed)
History and Physical    [name] VPX:[id number] DOB: Oct 20, 1960 DOA: 07/10/2017  PCP: [name], MD (Confirm with patient/family/NH records and if not entered, this has to be entered at [facility] point of entry) Patient coming from: [facility] I have personally briefly reviewed patient's old medical records in [place]  Chief Complaint: Sent by orthopedics for admission for pain management and surgery  HPI:  [name] is a 57 y.o. female with a past medical history of hypertension, breast cancer status post radiation therapy completed 2 years ago, who presents to ED for evaluation of ongoing lower back pain for the past several months.  She states that her orthopedic provider sent her to the ED for pain control, admission and surgery for radiculopathy tomorrow.  She states that despite the use of hydrocodone on at home, she continues to have pain.  She also has a history of lumbar fusion surgery several years ago.  Denies any injuries or falls, numbness in legs, loss of bowel or bladder function. Of note, patient also has an abscess to the back of her neck which is actively draining purulent material.  She had the area drained approximately 1 week ago and reports compliance with her home Bactrim.  She states that the area looks and feels better than it did previously.  She denies fevers.  She has been changing dressings as directed daily.  Due to the patient's left-sided breast surgery and an IV in her right arm blood pressures were obtained in her right forearm wrist area.  They were noted as being fairly low and the patient received some IV fluids.  Despite being awake alert completely oriented sitting up leaning forward and exhibiting no ill effects there was a great deal of concern regarding her pressures being measured at 70 systolic.  I was asked to admit the patient after discussion with the emergency department provider.  When the emergency department nurse  on evening shift measured her blood pressures in her right upper arm manually she was measured in the 90s.  Patient is awake alert and shows no evidence of any ill effects associated with hypotension.  She has a great deal of back pain is very anxious to get her back operated on.  Denies nausea vomiting diarrhea or constipation she has no melena no hematochezia, she denies shortness of breath or chest pain she has no double vision or blurry vision.  She does complain of pain in her back radicular in nature and severe.  Review of Systems: As per HPI otherwise all other systems reviewed and  negative.    Past Medical History:  Diagnosis Date  . Anxiety   . Arthritis    lower back  . Breast cancer ([place])   . Breast cancer of upper-outer quadrant of left female breast ([place]) 01/03/2016  . Complication of anesthesia    hx. of combativeness as child; had severe migraine headache day after back surgery  . Cough due to ACE inhibitor 01/28/2016  . Depression   . Eczema    right hip  . High cholesterol   . History of radiation therapy 05/29/16- 07/14/16   Left Breast/ 50.4 Gy n 25 fractions, Left Breast Boost/ 10 Gy in 5 fractions.   . Hypertension    states under control with meds., has been on med. since age 65  . Lymphocytic colitis   . Migraines   . Personal history of radiation therapy   . PONV (postoperative nausea and vomiting)   .  Sciatica   . Sinus drainage 01/28/2016  . Urinary urgency     Past Surgical History:  Procedure Laterality Date  . BREAST BIOPSY    . BREAST LUMPECTOMY     left 2017  . BREAST LUMPECTOMY WITH NEEDLE LOCALIZATION AND AXILLARY SENTINEL LYMPH NODE BX Left 02/04/2016   Procedure: LEFT BREAST LUMPECTOMY WITH 2 NEEDLE LOCALIZATIONS AND LEFT AXILLARY SENTINEL LYMPH NODE BIOPSY;  Surgeon: [name], MD;  Location: [facility];  Service: General;  Laterality: Left;  LEFT BREAST LUMPECTOMY WITH 2 NEEDLE LOCALIZATIONS AND LEFT AXILLARY SENTINEL LYMPH  NODE BIOPSY  . [place]  . COLONOSCOPY     Hx: of  . CYSTO  09/29/2005   with anterior repair  . ENDOMETRIAL ABLATION  09/29/2005  . INCISION AND DRAINAGE ABSCESS Left 02/21/2016   Procedure: INCISION AND DRAINAGE ABSCESS;  Surgeon: Excell Seltzer, MD;  Location: [facility];  Service: General;  Laterality: Left;  . INCONTINENCE SURGERY  09/29/2005  . KNEE ARTHROSCOPY W/ DEBRIDEMENT    . LUMBAR FUSION  04/28/2013   L3-4  . TONSILLECTOMY     age 63  . TUBAL LIGATION    . WISDOM TOOTH EXTRACTION     as a teenager     reports that she has been smoking cigarettes.  She has a 12.00 pack-year smoking history. she has never used smokeless tobacco. She reports that she does not drink alcohol or use drugs.  Allergies  Allergen Reactions  . Soap Shortness Of Breath and Rash    IVORY SOAP  . Adhesive [Tape] Other (See Comments)    BLISTERS  . Augmentin [Amoxicillin-Pot Clavulanate] Nausea And Vomiting    Has patient had a PCN reaction causing immediate rash, facial/tongue/throat swelling, SOB or lightheadedness with hypotension: Unknown Has patient had a PCN reaction causing severe rash involving mucus membranes or skin necrosis: Unknown Has patient had a PCN reaction that required hospitalization Unknown Has patient had a PCN reaction occurring within the last 10 years: Unknown If all of the above answers are "NO", then may proceed with Cephalosporin use.   Marland Kitchen Cymbalta [Duloxetine Hcl] Nausea Only  . Latex Other (See Comments) and Itching    REACTION: blisters BLISTERS  . Nsaids Other (See Comments)    LYMPHOCYTIC COLITIS  . Oxycodone Nausea Only    Not on an empty stomach  . Chlorhexidine Gluconate Rash    Pt says her skin looked like poison ivy  . Mupirocin Rash    Family History  Problem Relation Age of Onset  . Colon cancer Father        81-65  . Hypertension Father   . Colon polyps Unknown   . Diabetes Unknown   . Breast cancer Paternal Aunt       Prior to Admission medications   Medication Sig Start Date End Date Taking? Authorizing Provider  amitriptyline (ELAVIL) 25 MG tablet Take 25 mg by mouth at bedtime.   Yes [provider]  amLODipine (NORVASC) 5 MG tablet Take 5 mg by mouth daily.   Yes [provider]  aspirin-acetaminophen-caffeine (EXCEDRIN MIGRAINE) [phone] MG tablet Take 2 tablets by mouth every 6 (six) hours as needed for headache.   Yes [provider]  atorvastatin (LIPITOR) 20 MG tablet Take 20 mg by mouth daily.   Yes [provider]  budesonide (ENTOCORT EC) 3 MG 24 hr capsule Take 3 capsules (9 mg total) by mouth daily. 04/11/17  Yes Zehr, Laban Emperor, PA-C  cholecalciferol (VITAMIN D) 1000 units tablet Take 1,000 Units by mouth daily.   Yes [provider]  letrozole (FEMARA) 2.5 MG tablet Take 1 tablet (2.5 mg total) by mouth daily. 09/22/16  Yes Causey, Charlestine Massed, NP  losartan-hydrochlorothiazide (HYZAAR) 100-12.5 MG tablet Take 1 tablet by mouth daily. 10/16/16  Yes [provider]  Multiple Vitamins-Minerals (MULTIVITAMIN ADULT PO) Take 1 tablet by mouth daily.    Yes [provider]  oxyCODONE-acetaminophen (PERCOCET/ROXICET) 5-325 MG tablet Take 1 tablet by mouth every 4 (four) hours as needed for severe pain. 06/20/17  Yes Nicholas Lose, MD  Probiotic Product (PROBIOTIC DAILY PO) Take 1 capsule by mouth daily.    Yes [provider]  sulfamethoxazole-trimethoprim (BACTRIM DS) 800-160 MG tablet Take 1 tablet by mouth 2 (two) times daily for 10 days. 07/01/17 07/11/17 Yes Burky, Malachy Moan, NP  cyclobenzaprine (FLEXERIL) 10 MG tablet Take 1 tablet (10 mg total) by mouth 3 (three) times daily as needed for muscle spasms. Patient not taking: Reported on 07/10/2017 06/17/17   Jola Schmidt, MD  ibuprofen (ADVIL,MOTRIN) 600 MG tablet Take 1 tablet (600 mg total) by mouth every 8 (eight) hours as needed. Patient not taking: Reported on 07/10/2017  06/17/17   Jola Schmidt, MD  methocarbamol (ROBAXIN) 500 MG tablet Take 1 tablet (500 mg total) by mouth daily. Patient not taking: Reported on 07/10/2017 12/06/16   Nicholas Lose, MD  traMADol (ULTRAM) 50 MG tablet Take 1 tablet (50 mg total) by mouth every 6 (six) hours as needed. Patient not taking: Reported on 07/10/2017 06/17/17   Jola Schmidt, MD    Physical Exam: Vitals:   07/10/17 1945 07/10/17 2000 07/10/17 2015 07/10/17 2103  BP: 93/71 93/62 101/68 99/61  Pulse: 84 88 85 82  Resp: 15  11 15   Temp:    98 F (36.7 C)  TempSrc:    Oral  SpO2: 96% 98% 97% 99%  Weight:      Height:    5\' 8"  (1.727 m)   .TCS Constitutional: NAD, calm, comfortable sitting up on the stretcher with her legs crossed Vitals:   07/10/17 1945 07/10/17 2000 07/10/17 2015 07/10/17 2103  BP: 93/71 93/62 101/68 99/61  Pulse: 84 88 85 82  Resp: 15  11 15   Temp:    98 F (36.7 C)  TempSrc:    Oral  SpO2: 96% 98% 97% 99%  Weight:      Height:    5\' 8"  (1.727 m)   Eyes: PERRL, lids and conjunctivae normal ENMT: Mucous membranes are moist. Posterior pharynx clear of any exudate or lesions.Normal dentition.  Neck: normal, supple, no masses, no thyromegaly Respiratory: clear to auscultation bilaterally, no wheezing, no crackles. Normal respiratory effort. No accessory muscle use.  Cardiovascular: Regular rate and rhythm, no murmurs / rubs / gallops. No extremity edema. 2+ pedal pulses. No carotid bruits.  Abdomen: no tenderness, no masses palpated. No hepatosplenomegaly. Bowel sounds positive.  Musculoskeletal: no clubbing / cyanosis. No joint deformity upper and lower extremities. Good ROM, no contractures. Normal muscle tone.  Skin: no rashes, lesions, ulcers. No induration dressing in the posterior neck reveals purulent material on the dressing there is purulent drainage from a approximately 3-4 cm round wound is erythematous and painful Neurologic: CN 2-12 grossly intact. Sensation intact, DTR normal.  Strength 5/5 in all 4.  Psychiatric: Normal judgment and insight. Alert and oriented x 3. Normal mood.    Labs on Admission: I have personally reviewed following labs and  imaging studies  CBC: Recent Labs  Lab 07/10/17 1120  WBC 9.5  HGB 14.3  HCT 42.0  MCV 91.1  PLT 443   Basic Metabolic Panel: Recent Labs  Lab 07/10/17 1120  NA 132*  K 4.0  CL 96*  CO2 25  GLUCOSE 130*  BUN 10  CREATININE 1.08*  CALCIUM 9.1   GFR: Estimated Creatinine Clearance: 74.4 mL/min (A) (by C-G formula based on SCr of 1.08 mg/dL (H)). Liver Function Tests: Recent Labs  Lab 07/10/17 1120  AST 31  ALT 43  ALKPHOS 105  BILITOT 0.3  PROT 6.0*  ALBUMIN 3.3*   No results for input(s): LIPASE, AMYLASE in the last 168 hours. No results for input(s): AMMONIA in the last 168 hours. Coagulation Profile: No results for input(s): INR, PROTIME in the last 168 hours. Cardiac Enzymes: No results for input(s): CKTOTAL, CKMB, CKMBINDEX, TROPONINI in the last 168 hours. BNP (last 3 results) No results for input(s): PROBNP in the last 8760 hours. HbA1C: No results for input(s): HGBA1C in the last 72 hours. CBG: No results for input(s): GLUCAP in the last 168 hours. Lipid Profile: No results for input(s): CHOL, HDL, LDLCALC, TRIG, CHOLHDL, LDLDIRECT in the last 72 hours. Thyroid Function Tests: No results for input(s): TSH, T4TOTAL, FREET4, T3FREE, THYROIDAB in the last 72 hours. Anemia Panel: No results for input(s): VITAMINB12, FOLATE, FERRITIN, TIBC, IRON, RETICCTPCT in the last 72 hours. Urine analysis:    Component Value Date/Time   COLORURINE YELLOW 02/20/2016 2221   APPEARANCEUR CLOUDY (A) 02/20/2016 2221   LABSPEC 1.018 02/20/2016 2221   PHURINE 5.5 02/20/2016 2221   GLUCOSEU NEGATIVE 02/20/2016 2221   HGBUR NEGATIVE 02/20/2016 2221   BILIRUBINUR NEGATIVE 02/20/2016 2221   KETONESUR NEGATIVE 02/20/2016 2221   PROTEINUR NEGATIVE 02/20/2016 2221   UROBILINOGEN 0.2 04/18/2013 0950    NITRITE NEGATIVE 02/20/2016 2221   LEUKOCYTESUR NEGATIVE 02/20/2016 2221    Radiological Exams on Admission: Ct Soft Tissue Neck W Contrast  Result Date: 07/10/2017 CLINICAL DATA:  57 y/o  F; hypotension.  Cellulitis of neck. EXAM: CT NECK WITH CONTRAST TECHNIQUE: Multidetector CT imaging of the neck was performed using the standard protocol following the bolus administration of intravenous contrast. CONTRAST:  100 cc Isovue 370 COMPARISON:  None. FINDINGS: Pharynx and larynx: Normal. No mass or swelling. Salivary glands: No inflammation, mass, or stone. Thyroid: Nodule in right lobe of thyroid measuring 22 mm. Lymph nodes: None enlarged or abnormal density. Vascular: Mixed plaque of left carotid bifurcation with mild 50% proximal ICA stenosis. Several segments of fibrofatty plaque within common carotid arteries without high-grade stenosis. Limited intracranial: Negative. Visualized orbits: Negative. Mastoids and visualized paranasal sinuses: Mild ethmoid and maxillary sinus mucosal thickening. Normal aeration of mastoid air cells. Skeleton: Mild cervical spondylosis with multilevel disc and facet degenerative changes. Uncovertebral and facet hypertrophy results in multiple left-sided narrowing of bony neural foramen. Upper chest: Negative. Other: Multiloculated abscess centered within the posterior lower neck subcutaneous fat measuring 1.5 x 4.1 x 3.0 cm in conglomerate (AP x ML x CC series 1, image 39 and series 6, image 39). There is a sinus tract extending to the skin surface. Surrounding inflammation is present within subcutaneous fat. The abscess does not extend into the deep cervical soft tissues. IMPRESSION: Multiloculated abscess centered within posterior lower neck subcutaneous fat with sinus tract to skin surface measuring up to 4.1 cm. Electronically Signed   By: Kristine Garbe M.D.   On: 07/10/2017 18:27   Ct Angio Chest/abd/pel  For Dissection W And/or Wo Contrast  Result Date:  07/10/2017 CLINICAL DATA:  Hypotension.  Neck cellulitis. EXAM: CT ANGIOGRAPHY CHEST, ABDOMEN AND PELVIS TECHNIQUE: Multidetector CT imaging through the chest, abdomen and pelvis was performed using the standard protocol during bolus administration of intravenous contrast. Multiplanar reconstructed images and MIPs were obtained and reviewed to evaluate the vascular anatomy. CONTRAST:  100 mL Isovue 370 COMPARISON:  CTs of the lumbar spine and right hip 06/17/2017 FINDINGS: CTA CHEST FINDINGS Cardiovascular: There is no evidence of thoracic aortic intramural hematoma on precontrast images. The thoracic aorta is normal in caliber without aneurysm or dissection. The heart is normal in size. There is lipomatous hypertrophy of the interatrial septum. There is no pericardial effusion. No central pulmonary emboli are identified on this nondedicated study. Mediastinum/Nodes: Heterogeneity of the right greater than left thyroid lobes with assessment limited by streak artifact from venous contrast. No enlarged axillary or mediastinal lymph nodes. Subcentimeter hilar lymph nodes bilaterally, slightly more prominent on the right. Small sliding hiatal hernia. Lungs/Pleura: No pleural effusion or pneumothorax. Evaluation of the lung parenchyma is mildly limited by respiratory motion artifact. There is mild bronchial wall thickening bilaterally and minimal dependent atelectasis bilaterally. No mass. Musculoskeletal: No acute osseous abnormality or suspicious osseous lesion. Review of the MIP images confirms the above findings. CTA ABDOMEN AND PELVIS FINDINGS VASCULAR Aorta: Patent with mild diffuse atherosclerotic irregularity. No aneurysm or dissection. Celiac: Patent without evidence of aneurysm, dissection, vasculitis or significant stenosis. Accessory right hepatic artery arises from the left gastric artery, a normal variant. SMA: Patent with less than 50% narrowing proximally.  No dissection. Renals: Single renal arteries  bilaterally are patent without evidence of dissection. 65% stenosis of the proximal right main renal artery. IMA: Patent without evidence of aneurysm, dissection, vasculitis or significant stenosis. Inflow: Patent with mild atherosclerosis bilaterally. No significant stenosis or dissection. Veins: No obvious venous abnormality within the limitations of this arterial phase study. Review of the MIP images confirms the above findings. NON-VASCULAR Hepatobiliary: No focal liver abnormality is seen. No gallstones, gallbladder wall thickening, or biliary dilatation. Pancreas: Unremarkable. Spleen: Unremarkable. Adrenals/Urinary Tract: Unremarkable adrenal glands. No hydronephrosis. Subcentimeter right renal hypodensity, too small to fully characterize. Unremarkable bladder. Stomach/Bowel: No bowel obstruction or gross wall thickening. Appendix not clearly visualized, however no inflammatory changes are seen to suggest appendicitis. Lymphatic: No enlarged lymph nodes. Reproductive: Uterus and bilateral adnexa are unremarkable. Other: No intraperitoneal free fluid.  No abdominal wall hernia. Musculoskeletal: Prior lumbar fusion with advanced disc degeneration above and below the fused segment. Mild-to-moderate lumbar levoscoliosis. Review of the MIP images confirms the above findings. IMPRESSION: 1. No aortic aneurysm or dissection. Aortic Atherosclerosis (ICD10-I70.0). 2. 65% right renal artery stenosis. 3. Bronchial wall thickening. 4. Small hiatal hernia. Electronically Signed   By: Logan Bores M.D.   On: 07/10/2017 18:43    EKG: Independently reviewed.  Personally viewed by me shows a sinus rhythm  Assessment/Plan Principal Problem:   Hypotension Active Problems:   Neck abscess   Spinal stenosis, lumbar region, with neurogenic claudication   Hyponatremia   Breast cancer of upper-outer quadrant of left female breast ([place])  1.  Hypotension: I believe this may be a reading error due to the location the blood  pressure cuff was used.  We will place the patient in observation and monitor her blood pressures.  She has received IV fluids in the emergency department.  She appears to be in no distress.  2.  Neck abscess: Given  that CT scanning shows a persistent abscess despite having been drained last week and despite compliance with antibiotics and will consult general surgery patient may require some probing of the area to break up the loculations.  3.  Spinal stenosis of the lumbar region with neurogenic claudication: Patient will need surgery.  I am not sure if she can undergo surgery with an active infection and abscess.  This is an MRSA abscess per culture data by patient report.  Will be placed in contact isolation and we will continue with vancomycin as started in the emergency department.  4.  Hyponatremia: She will receive IV fluids.  BMP in a.m.  5.  Breast cancer of upper-outer quadrant of left female breast: Last treatment 2 years ago she continues to actively follow with her oncologist and radiation oncology as well.  Continue letrozole   DVT prophylaxis: SCDs Code Status: Full code Family Communication: Spoke with patient who is awake alert and retains capacity no family members present Disposition Plan: Likely home in 71 hours Consults called: Dr. Windle Guard from general surgery Admission status: Observation   Lady Deutscher MD [facility] Pager [phone]  If 7PM-7AM, please contact night-coverage [email or web address] Password [facility]  07/10/2017, 11:32 PM

## 2017-07-10 NOTE — ED Notes (Signed)
BP measured with pt on back instead of left side lying.97/61. Same reported to Dr. Julious Oka

## 2017-07-10 NOTE — ED Notes (Signed)
Pt sitting at bedside after wqalking to bathroom. IV site redressed and flows to gravity with out redneess or swelling.

## 2017-07-10 NOTE — ED Notes (Signed)
Got patient undress on the monitor patient refuse to have the bed rails up patient is resting with call bell in reach and family at bedside

## 2017-07-10 NOTE — ED Notes (Signed)
Admitting MD at bedside.

## 2017-07-10 NOTE — ED Notes (Signed)
Pt noted as walking in room. Cautioned for fall

## 2017-07-10 NOTE — ED Notes (Signed)
Pt returns from ct as unable to remain still for scxan. Pt refuses cardiac monitor leads and remain on left saide lying position.

## 2017-07-11 ENCOUNTER — Observation Stay (HOSPITAL_COMMUNITY): Payer: Medicaid Other | Admitting: Certified Registered"

## 2017-07-11 ENCOUNTER — Inpatient Hospital Stay (HOSPITAL_COMMUNITY): Payer: Medicaid Other

## 2017-07-11 ENCOUNTER — Encounter (HOSPITAL_COMMUNITY): Payer: Self-pay | Admitting: General Practice

## 2017-07-11 ENCOUNTER — Encounter (HOSPITAL_COMMUNITY)
Admission: EM | Disposition: A | Discharge status: Home Health | Payer: Self-pay | Source: Home / Self Care | Attending: Internal Medicine

## 2017-07-11 ENCOUNTER — Other Ambulatory Visit: Payer: Self-pay

## 2017-07-11 DIAGNOSIS — M48062 Spinal stenosis, lumbar region with neurogenic claudication: Principal | ICD-10-CM

## 2017-07-11 DIAGNOSIS — M5416 Radiculopathy, lumbar region: Secondary | ICD-10-CM | POA: Diagnosis not present

## 2017-07-11 DIAGNOSIS — F17210 Nicotine dependence, cigarettes, uncomplicated: Secondary | ICD-10-CM | POA: Diagnosis not present

## 2017-07-11 DIAGNOSIS — M4186 Other forms of scoliosis, lumbar region: Secondary | ICD-10-CM | POA: Diagnosis not present

## 2017-07-11 DIAGNOSIS — Z800 Family history of malignant neoplasm of digestive organs: Secondary | ICD-10-CM | POA: Diagnosis not present

## 2017-07-11 DIAGNOSIS — B9562 Methicillin resistant Staphylococcus aureus infection as the cause of diseases classified elsewhere: Secondary | ICD-10-CM | POA: Diagnosis present

## 2017-07-11 DIAGNOSIS — L03221 Cellulitis of neck: Secondary | ICD-10-CM | POA: Diagnosis not present

## 2017-07-11 DIAGNOSIS — Y92009 Unspecified place in unspecified non-institutional (private) residence as the place of occurrence of the external cause: Secondary | ICD-10-CM | POA: Diagnosis not present

## 2017-07-11 DIAGNOSIS — Z853 Personal history of malignant neoplasm of breast: Secondary | ICD-10-CM | POA: Diagnosis not present

## 2017-07-11 DIAGNOSIS — I701 Atherosclerosis of renal artery: Secondary | ICD-10-CM | POA: Diagnosis present

## 2017-07-11 DIAGNOSIS — T502X5A Adverse effect of carbonic-anhydrase inhibitors, benzothiadiazides and other diuretics, initial encounter: Secondary | ICD-10-CM | POA: Diagnosis present

## 2017-07-11 DIAGNOSIS — Z803 Family history of malignant neoplasm of breast: Secondary | ICD-10-CM | POA: Diagnosis not present

## 2017-07-11 DIAGNOSIS — E860 Dehydration: Secondary | ICD-10-CM | POA: Diagnosis present

## 2017-07-11 DIAGNOSIS — I959 Hypotension, unspecified: Secondary | ICD-10-CM | POA: Diagnosis not present

## 2017-07-11 DIAGNOSIS — Z79811 Long term (current) use of aromatase inhibitors: Secondary | ICD-10-CM | POA: Diagnosis not present

## 2017-07-11 DIAGNOSIS — E669 Obesity, unspecified: Secondary | ICD-10-CM | POA: Diagnosis present

## 2017-07-11 DIAGNOSIS — Z6836 Body mass index (BMI) 36.0-36.9, adult: Secondary | ICD-10-CM | POA: Diagnosis not present

## 2017-07-11 DIAGNOSIS — E7800 Pure hypercholesterolemia, unspecified: Secondary | ICD-10-CM | POA: Diagnosis present

## 2017-07-11 DIAGNOSIS — K449 Diaphragmatic hernia without obstruction or gangrene: Secondary | ICD-10-CM | POA: Diagnosis present

## 2017-07-11 DIAGNOSIS — I10 Essential (primary) hypertension: Secondary | ICD-10-CM | POA: Diagnosis present

## 2017-07-11 DIAGNOSIS — E871 Hypo-osmolality and hyponatremia: Secondary | ICD-10-CM | POA: Diagnosis not present

## 2017-07-11 DIAGNOSIS — L0211 Cutaneous abscess of neck: Secondary | ICD-10-CM

## 2017-07-11 DIAGNOSIS — Z923 Personal history of irradiation: Secondary | ICD-10-CM | POA: Diagnosis not present

## 2017-07-11 DIAGNOSIS — I700 Atherosclerosis of aorta: Secondary | ICD-10-CM | POA: Diagnosis present

## 2017-07-11 DIAGNOSIS — K52832 Lymphocytic colitis: Secondary | ICD-10-CM | POA: Diagnosis not present

## 2017-07-11 DIAGNOSIS — L0291 Cutaneous abscess, unspecified: Secondary | ICD-10-CM

## 2017-07-11 DIAGNOSIS — I739 Peripheral vascular disease, unspecified: Secondary | ICD-10-CM | POA: Diagnosis present

## 2017-07-11 HISTORY — PX: ANTERIOR LAT LUMBAR FUSION: SHX1168

## 2017-07-11 LAB — TYPE AND SCREEN
ABO/RH(D): A POS
ANTIBODY SCREEN: NEGATIVE

## 2017-07-11 LAB — GLUCOSE, CAPILLARY: GLUCOSE-CAPILLARY: 142 mg/dL — AB (ref 65–99)

## 2017-07-11 LAB — HIV ANTIBODY (ROUTINE TESTING W REFLEX): HIV Screen 4th Generation wRfx: NONREACTIVE

## 2017-07-11 LAB — MRSA PCR SCREENING: MRSA BY PCR: POSITIVE — AB

## 2017-07-11 SURGERY — ANTERIOR LATERAL LUMBAR FUSION 2 LEVELS
Anesthesia: General | Laterality: Left

## 2017-07-11 MED ORDER — OXYCODONE HCL 5 MG/5ML PO SOLN: 5.0000 mg | Freq: Once | ORAL | Status: DC | PRN

## 2017-07-11 MED ORDER — THROMBIN (RECOMBINANT) 20000 UNITS EX SOLR
CUTANEOUS | Status: DC | PRN
  Administered 2017-07-11: 20000 [IU] via TOPICAL

## 2017-07-11 MED ORDER — POTASSIUM CHLORIDE IN NACL 20-0.9 MEQ/L-% IV SOLN
INTRAVENOUS | Status: DC
  Administered 2017-07-11: 21:00:00 via INTRAVENOUS
  Filled 2017-07-11: qty 1000

## 2017-07-11 MED ORDER — PHENYLEPHRINE HCL 10 MG/ML IJ SOLN
INTRAVENOUS | Status: DC | PRN
  Administered 2017-07-11: 50 ug/min via INTRAVENOUS

## 2017-07-11 MED ORDER — ALUM & MAG HYDROXIDE-SIMETH 200-200-20 MG/5ML PO SUSP: 30.0000 mL | Freq: Four times a day (QID) | ORAL | Status: DC | PRN

## 2017-07-11 MED ORDER — DOCUSATE SODIUM 100 MG PO CAPS
100.0000 mg | ORAL_CAPSULE | Freq: Two times a day (BID) | ORAL | Status: DC
  Administered 2017-07-11 – 2017-07-14 (×6): 100 mg via ORAL
  Filled 2017-07-11 (×6): qty 1

## 2017-07-11 MED ORDER — PHENYLEPHRINE 40 MCG/ML (10ML) SYRINGE FOR IV PUSH (FOR BLOOD PRESSURE SUPPORT)
PREFILLED_SYRINGE | INTRAVENOUS | Status: AC
  Filled 2017-07-11: qty 10

## 2017-07-11 MED ORDER — PROPOFOL 500 MG/50ML IV EMUL
INTRAVENOUS | Status: DC | PRN
  Administered 2017-07-11: 16:00:00 via INTRAVENOUS
  Administered 2017-07-11: 75 ug/kg/min via INTRAVENOUS

## 2017-07-11 MED ORDER — BUPIVACAINE-EPINEPHRINE 0.5% -1:200000 IJ SOLN
INTRAMUSCULAR | Status: DC | PRN
  Administered 2017-07-11: 8 mL

## 2017-07-11 MED ORDER — SODIUM CHLORIDE 0.9 % IV SOLN: 250.0000 mL | INTRAVENOUS | Status: DC

## 2017-07-11 MED ORDER — PROMETHAZINE HCL 25 MG/ML IJ SOLN
INTRAMUSCULAR | Status: AC
  Filled 2017-07-11: qty 1

## 2017-07-11 MED ORDER — FENTANYL CITRATE (PF) 250 MCG/5ML IJ SOLN
INTRAMUSCULAR | Status: AC
  Filled 2017-07-11: qty 5

## 2017-07-11 MED ORDER — DIAZEPAM 5 MG PO TABS
5.0000 mg | ORAL_TABLET | Freq: Four times a day (QID) | ORAL | Status: DC | PRN
  Administered 2017-07-11 – 2017-07-12 (×2): 5 mg via ORAL
  Filled 2017-07-11 (×2): qty 1

## 2017-07-11 MED ORDER — OXYCODONE-ACETAMINOPHEN 5-325 MG PO TABS
1.0000 | ORAL_TABLET | ORAL | Status: DC | PRN
  Administered 2017-07-11 (×2): 2 via ORAL
  Administered 2017-07-12: 1 via ORAL
  Administered 2017-07-12 – 2017-07-14 (×10): 2 via ORAL
  Filled 2017-07-11: qty 2
  Filled 2017-07-11: qty 1
  Filled 2017-07-11 (×10): qty 2

## 2017-07-11 MED ORDER — SODIUM CHLORIDE 0.9% FLUSH
3.0000 mL | Freq: Two times a day (BID) | INTRAVENOUS | Status: DC
  Administered 2017-07-12 – 2017-07-13 (×3): 3 mL via INTRAVENOUS

## 2017-07-11 MED ORDER — METHOCARBAMOL 500 MG PO TABS
ORAL_TABLET | ORAL | Status: AC
  Filled 2017-07-11: qty 1

## 2017-07-11 MED ORDER — BUPIVACAINE LIPOSOME 1.3 % IJ SUSP
20.0000 mL | INTRAMUSCULAR | Status: DC
  Filled 2017-07-11: qty 20

## 2017-07-11 MED ORDER — MORPHINE SULFATE (PF) 2 MG/ML IV SOLN
1.0000 mg | INTRAVENOUS | Status: DC | PRN
  Administered 2017-07-11 – 2017-07-13 (×6): 2 mg via INTRAVENOUS
  Filled 2017-07-11 (×6): qty 1

## 2017-07-11 MED ORDER — PROPOFOL 10 MG/ML IV BOLUS
INTRAVENOUS | Status: DC | PRN
  Administered 2017-07-11: 200 mg via INTRAVENOUS

## 2017-07-11 MED ORDER — ACETAMINOPHEN 325 MG PO TABS
650.0000 mg | ORAL_TABLET | ORAL | Status: DC | PRN
Start: 2017-07-11 — End: 2017-07-14

## 2017-07-11 MED ORDER — FLEET ENEMA 7-19 GM/118ML RE ENEM: 1.0000 | ENEMA | Freq: Once | RECTAL | Status: DC | PRN

## 2017-07-11 MED ORDER — FENTANYL CITRATE (PF) 100 MCG/2ML IJ SOLN
INTRAMUSCULAR | Status: AC
  Administered 2017-07-11: 50 ug via INTRAVENOUS
  Filled 2017-07-11: qty 2

## 2017-07-11 MED ORDER — MENTHOL 3 MG MT LOZG: 1.0000 | LOZENGE | OROMUCOSAL | Status: DC | PRN

## 2017-07-11 MED ORDER — ACETAMINOPHEN 650 MG RE SUPP: 650.0000 mg | RECTAL | Status: DC | PRN

## 2017-07-11 MED ORDER — FENTANYL CITRATE (PF) 100 MCG/2ML IJ SOLN
INTRAMUSCULAR | Status: AC
  Filled 2017-07-11: qty 2

## 2017-07-11 MED ORDER — CEFAZOLIN SODIUM-DEXTROSE 2-3 GM-%(50ML) IV SOLR
INTRAVENOUS | Status: DC | PRN
  Administered 2017-07-11: 2 g via INTRAVENOUS

## 2017-07-11 MED ORDER — EPHEDRINE SULFATE-NACL 50-0.9 MG/10ML-% IV SOSY: PREFILLED_SYRINGE | INTRAVENOUS | Status: DC | PRN

## 2017-07-11 MED ORDER — OXYCODONE-ACETAMINOPHEN 5-325 MG PO TABS
ORAL_TABLET | ORAL | Status: AC
  Filled 2017-07-11: qty 2

## 2017-07-11 MED ORDER — OXYCODONE HCL 5 MG PO TABS: 5.0000 mg | ORAL_TABLET | Freq: Once | ORAL | Status: DC | PRN

## 2017-07-11 MED ORDER — BUPIVACAINE-EPINEPHRINE (PF) 0.5% -1:200000 IJ SOLN
INTRAMUSCULAR | Status: AC
  Filled 2017-07-11: qty 30

## 2017-07-11 MED ORDER — LACTATED RINGERS IV SOLN
INTRAVENOUS | Status: DC
  Administered 2017-07-11 – 2017-07-12 (×3): via INTRAVENOUS

## 2017-07-11 MED ORDER — FENTANYL CITRATE (PF) 100 MCG/2ML IJ SOLN
25.0000 ug | INTRAMUSCULAR | Status: DC | PRN
  Administered 2017-07-11 (×3): 50 ug via INTRAVENOUS

## 2017-07-11 MED ORDER — SUCCINYLCHOLINE CHLORIDE 200 MG/10ML IV SOSY
PREFILLED_SYRINGE | INTRAVENOUS | Status: AC
Start: 2017-07-11 — End: ?
  Filled 2017-07-11: qty 10

## 2017-07-11 MED ORDER — SENNOSIDES-DOCUSATE SODIUM 8.6-50 MG PO TABS: 1.0000 | ORAL_TABLET | Freq: Every evening | ORAL | Status: DC | PRN

## 2017-07-11 MED ORDER — FENTANYL CITRATE (PF) 250 MCG/5ML IJ SOLN
INTRAMUSCULAR | Status: DC | PRN
  Administered 2017-07-11 (×3): 50 ug via INTRAVENOUS
  Administered 2017-07-11: 100 ug via INTRAVENOUS
  Administered 2017-07-11: 50 ug via INTRAVENOUS

## 2017-07-11 MED ORDER — PHENYLEPHRINE 40 MCG/ML (10ML) SYRINGE FOR IV PUSH (FOR BLOOD PRESSURE SUPPORT)
PREFILLED_SYRINGE | INTRAVENOUS | Status: DC | PRN
  Administered 2017-07-11: 120 ug via INTRAVENOUS

## 2017-07-11 MED ORDER — THROMBIN (RECOMBINANT) 20000 UNITS EX SOLR
CUTANEOUS | Status: AC
  Filled 2017-07-11: qty 20000

## 2017-07-11 MED ORDER — MIDAZOLAM HCL 2 MG/2ML IJ SOLN
INTRAMUSCULAR | Status: AC
  Filled 2017-07-11: qty 2

## 2017-07-11 MED ORDER — BISACODYL 5 MG PO TBEC: 5.0000 mg | DELAYED_RELEASE_TABLET | Freq: Every day | ORAL | Status: DC | PRN

## 2017-07-11 MED ORDER — LIDOCAINE 2% (20 MG/ML) 5 ML SYRINGE
INTRAMUSCULAR | Status: DC | PRN
  Administered 2017-07-11: 100 mg via INTRAVENOUS

## 2017-07-11 MED ORDER — FENTANYL CITRATE (PF) 100 MCG/2ML IJ SOLN: INTRAMUSCULAR | Status: DC | PRN

## 2017-07-11 MED ORDER — ONDANSETRON HCL 4 MG/2ML IJ SOLN
INTRAMUSCULAR | Status: DC | PRN
  Administered 2017-07-11: 4 mg via INTRAVENOUS

## 2017-07-11 MED ORDER — 0.9 % SODIUM CHLORIDE (POUR BTL) OPTIME
TOPICAL | Status: DC | PRN
  Administered 2017-07-11: 1000 mL

## 2017-07-11 MED ORDER — SODIUM CHLORIDE 0.9% FLUSH: 3.0000 mL | INTRAVENOUS | Status: DC | PRN

## 2017-07-11 MED ORDER — SUCCINYLCHOLINE CHLORIDE 200 MG/10ML IV SOSY
PREFILLED_SYRINGE | INTRAVENOUS | Status: DC | PRN
  Administered 2017-07-11: 120 mg via INTRAVENOUS

## 2017-07-11 MED ORDER — ZOLPIDEM TARTRATE 5 MG PO TABS: 5.0000 mg | ORAL_TABLET | Freq: Every evening | ORAL | Status: DC | PRN

## 2017-07-11 MED ORDER — PANTOPRAZOLE SODIUM 40 MG IV SOLR
40.0000 mg | Freq: Every day | INTRAVENOUS | Status: DC
  Administered 2017-07-11 – 2017-07-12 (×2): 40 mg via INTRAVENOUS
  Filled 2017-07-11 (×2): qty 40

## 2017-07-11 MED ORDER — PHENOL 1.4 % MT LIQD: 1.0000 | OROMUCOSAL | Status: DC | PRN

## 2017-07-11 MED ORDER — PROMETHAZINE HCL 25 MG/ML IJ SOLN
6.2500 mg | INTRAMUSCULAR | Status: DC | PRN
  Administered 2017-07-11: 6.25 mg via INTRAVENOUS

## 2017-07-11 MED ORDER — PROPOFOL 10 MG/ML IV BOLUS
INTRAVENOUS | Status: AC
  Filled 2017-07-11: qty 20

## 2017-07-11 MED ORDER — CEFAZOLIN SODIUM 1 G IJ SOLR
INTRAMUSCULAR | Status: AC
Start: 2017-07-11 — End: ?
  Filled 2017-07-11: qty 20

## 2017-07-11 MED ORDER — MIDAZOLAM HCL 5 MG/5ML IJ SOLN
INTRAMUSCULAR | Status: DC | PRN
  Administered 2017-07-11: 2 mg via INTRAVENOUS

## 2017-07-11 SURGICAL SUPPLY — 74 items
BENZOIN TINCTURE PRP APPL 2/3 (GAUZE/BANDAGES/DRESSINGS) ×3 IMPLANT
BLADE CLIPPER SURG (BLADE) IMPLANT
BLADE SURG 10 STRL SS (BLADE) ×3 IMPLANT
BOLT DECADE 5.5X40 (Bolt) ×2 IMPLANT
BOLT DECADE 5.5X40MM (Bolt) ×1 IMPLANT
BOLT SPNL LRG 45X5.5XPLAT NS (Screw) ×1 IMPLANT
BONE VIVIGEN FORMABLE 10CC (Bone Implant) ×3 IMPLANT
BONE VIVIGEN FORMABLE 5.4CC (Bone Implant) ×3 IMPLANT
CAGE COROENT XL 10X18X150 (Cage) ×3 IMPLANT
CAGE COROENT XL PLUS 10X18X55 (Cage) ×3 IMPLANT
CATH FOLEY LATEX FREE 14FR (CATHETERS) ×2
CATH FOLEY LF 14FR (CATHETERS) ×1 IMPLANT
CLOSURE WOUND 1/2 X4 (GAUZE/BANDAGES/DRESSINGS) ×1
COVER BACK TABLE 80X110 HD (DRAPES) ×3 IMPLANT
COVER SURGICAL LIGHT HANDLE (MISCELLANEOUS) ×3 IMPLANT
DRAPE C-ARM 42X72 X-RAY (DRAPES) ×3 IMPLANT
DRAPE C-ARMOR (DRAPES) ×3 IMPLANT
DRAPE POUCH INSTRU U-SHP 10X18 (DRAPES) ×3 IMPLANT
DRAPE SURG 17X23 STRL (DRAPES) ×12 IMPLANT
DURAPREP 26ML APPLICATOR (WOUND CARE) ×3 IMPLANT
ELECT BLADE 6.5 EXT (BLADE) ×3 IMPLANT
ELECT CAUTERY BLADE 6.4 (BLADE) ×3 IMPLANT
ELECT REM PT RETURN 9FT ADLT (ELECTROSURGICAL) ×3
ELECTRODE REM PT RTRN 9FT ADLT (ELECTROSURGICAL) ×1 IMPLANT
GAUZE SPONGE 4X4 12PLY STRL (GAUZE/BANDAGES/DRESSINGS) ×3 IMPLANT
GAUZE SPONGE 4X4 16PLY XRAY LF (GAUZE/BANDAGES/DRESSINGS) ×3 IMPLANT
GLOVE BIO SURGEON STRL SZ7 (GLOVE) IMPLANT
GLOVE BIO SURGEON STRL SZ8 (GLOVE) ×3 IMPLANT
GLOVE BIOGEL PI IND STRL 6.5 (GLOVE) ×2 IMPLANT
GLOVE BIOGEL PI IND STRL 7.0 (GLOVE) ×1 IMPLANT
GLOVE BIOGEL PI IND STRL 8 (GLOVE) ×1 IMPLANT
GLOVE BIOGEL PI INDICATOR 6.5 (GLOVE) ×4
GLOVE BIOGEL PI INDICATOR 7.0 (GLOVE) ×2
GLOVE BIOGEL PI INDICATOR 8 (GLOVE) ×2
GLOVE SURG SS PI 6.5 STRL IVOR (GLOVE) ×6 IMPLANT
GLOVE SURG SS PI 7.0 STRL IVOR (GLOVE) ×3 IMPLANT
GLOVE SURG SS PI 8.5 STRL IVOR (GLOVE) ×2
GLOVE SURG SS PI 8.5 STRL STRW (GLOVE) ×1 IMPLANT
GOWN STRL REUS W/ TWL LRG LVL3 (GOWN DISPOSABLE) ×1 IMPLANT
GOWN STRL REUS W/ TWL XL LVL3 (GOWN DISPOSABLE) ×2 IMPLANT
GOWN STRL REUS W/TWL LRG LVL3 (GOWN DISPOSABLE) ×2
GOWN STRL REUS W/TWL XL LVL3 (GOWN DISPOSABLE) ×4
KIT BASIN OR (CUSTOM PROCEDURE TRAY) ×3 IMPLANT
KIT DILATOR XLIF 5 (KITS) ×2 IMPLANT
KIT ROOM TURNOVER OR (KITS) ×3 IMPLANT
KIT SURGICAL ACCESS MAXCESS 4 (KITS) ×3 IMPLANT
KIT XLIF (KITS) ×1
MARKER SKIN DUAL TIP RULER LAB (MISCELLANEOUS) ×3 IMPLANT
MODULE EMG NEEDLE SSEP NVM5 (NEEDLE) ×3 IMPLANT
MODULE NVM5 NEXT GEN EMG (NEEDLE) ×3 IMPLANT
NEEDLE HYPO 25GX1X1/2 BEV (NEEDLE) ×3 IMPLANT
NEEDLE SPNL 18GX3.5 QUINCKE PK (NEEDLE) ×3 IMPLANT
NS IRRIG 1000ML POUR BTL (IV SOLUTION) ×6 IMPLANT
PACK LAMINECTOMY ORTHO (CUSTOM PROCEDURE TRAY) ×3 IMPLANT
PACK UNIVERSAL I (CUSTOM PROCEDURE TRAY) ×3 IMPLANT
PAD ARMBOARD 7.5X6 YLW CONV (MISCELLANEOUS) ×6 IMPLANT
PLATE 2H 10MM (Plate) ×3 IMPLANT
SCREW 45MM (Screw) ×2 IMPLANT
SPONGE INTESTINAL PEANUT (DISPOSABLE) ×3 IMPLANT
SPONGE LAP 4X18 X RAY DECT (DISPOSABLE) ×3 IMPLANT
SPONGE SURGIFOAM ABS GEL 100 (HEMOSTASIS) IMPLANT
STRIP CLOSURE SKIN 1/2X4 (GAUZE/BANDAGES/DRESSINGS) ×2 IMPLANT
SURGIFLO W/THROMBIN 8M KIT (HEMOSTASIS) IMPLANT
SUT MNCRL AB 4-0 PS2 18 (SUTURE) ×3 IMPLANT
SUT VIC AB 0 CT1 18XCR BRD 8 (SUTURE) ×1 IMPLANT
SUT VIC AB 0 CT1 8-18 (SUTURE) ×2
SUT VIC AB 2-0 CT2 18 VCP726D (SUTURE) ×3 IMPLANT
SYR BULB IRRIGATION 50ML (SYRINGE) ×3 IMPLANT
SYR CONTROL 10ML LL (SYRINGE) ×3 IMPLANT
TOWEL OR 17X24 6PK STRL BLUE (TOWEL DISPOSABLE) ×3 IMPLANT
TOWEL OR 17X26 10 PK STRL BLUE (TOWEL DISPOSABLE) ×3 IMPLANT
TRAY FOLEY CATH SILVER 16FR (SET/KITS/TRAYS/PACK) IMPLANT
WATER STERILE IRR 1000ML POUR (IV SOLUTION) ×3 IMPLANT
YANKAUER SUCT BULB TIP NO VENT (SUCTIONS) ×3 IMPLANT

## 2017-07-11 NOTE — Progress Notes (Signed)
I did again discuss the specifics of surgery with the patient and her daughter. Risks and recovery period was again explained to the patient in detail. I did inform the patient that she is likely at a higher than average risk of infection, given her neck abscess, however, she is afebrile with a normal WBC count, and she is getting appropriate treatment by internal medicine and general surgery, and is on abx. As previously documented, we will proceed with her L1-L3 lateral fusion today, to be followed by a posterior decompression and fusion tomorrow.

## 2017-07-11 NOTE — Progress Notes (Signed)
Asked to see the patient regarding recent abscess and impending surgery. She though is currently in the OR so I was unable to see her formally.  Risk outlined by Dr. Lynann Bologna, likely higher risk with recent infection but I would not suspect it to be much higher.   Thayer Headings, MD

## 2017-07-11 NOTE — Progress Notes (Signed)
PROGRESS NOTE    [name]  GMW:[id number] DOB: [birth year] DOA: 07/10/2017 PCP: [name], MD   Outpatient Specialists:     Brief Narrative:  [name] a 57 y.o.femalewith a past medical history of hypertension, breast cancer status post radiation therapy completed 2 years ago, who presents to ED for evaluation of ongoing lower back pain for the past several months. She states that her orthopedic provider sent her to the ED for pain control, admission and surgery for radiculopathy tomorrow. She states that despite the use of hydrocodone on at home, she continues to have pain. She also has a history of lumbar fusion surgery several years ago. Denies any injuries or falls, numbness in legs, loss of bowel or bladder function. Of note, patient also has an abscess to the back of her neck which is actively draining purulent material. She had the area drained approximately 1 week ago and reports compliance with her home Bactrim.  She was also found to be hypotensive in the ER  Assessment & Plan:   Principal Problem:   Hypotension Active Problems:   Spinal stenosis, lumbar region, with neurogenic claudication   Breast cancer of upper-outer quadrant of left female breast ([place])   Neck abscess   Hyponatremia    Hypotension:  -? accurracy of reading -patient is asymptomatic -d/c BP medications (ACE/HCTZ combo)   Neck abscess:  -CT scanning shows a persistent abscess despite having been drained last week and despite compliance with antibiotics (bactrim) -general surgery: re-culture sent -IV vanc -ID consult -patient reported MRSA on culture but unable to find in system -blood cultures ordered  Spinal stenosis of the lumbar region with neurogenic claudication:  -vancomycin -ID consult: vancomycin for peri-op treatment -at high risk for surgical infection -called Dr. Laurena Bering office to discuss-- placed on hold > 10 min   Hyponatremia:  -IV -d/c  HCTZ  Breast cancer of upper-outer quadrant of left female breast:  -Last treatment 2 years ago she continues to actively follow with her oncologist and radiation oncology as well.  Continue letrozole     DVT prophylaxis:  Per ortho  Code Status: Full Code   Family Communication:   Disposition Plan:     Consultants:   ID  Ortho  GS    Subjective: Anxious to go to surgery  Objective: Vitals:   07/11/17 0050 07/11/17 0500 07/11/17 0824 07/11/17 0830  BP: 98/61 122/65 (!) 86/43 108/79  Pulse: 71 93    Resp: 16 18    Temp: 98.2 F (36.8 C)     TempSrc: Axillary     SpO2: 98% 95% 91% 93%  Weight:  106.8 kg (235 lb 7.2 oz)    Height:  5\' 7"  (1.702 m)      Intake/Output Summary (Last 24 hours) at 07/11/2017 1142 Last data filed at 07/11/2017 0800 Gross per 24 hour  Intake 916.75 ml  Output -  Net 916.75 ml   Filed Weights   07/10/17 1042 07/11/17 0500  Weight: 104.3 kg (230 lb) 106.8 kg (235 lb 7.2 oz)    Examination:  General exam: laying flat in bed Respiratory system: no increase work of breathing Cardiovascular system: rrr Gastrointestinal system: +BS, soft Central nervous system: Alert .  No focal neurological deficits. Extremities: moves all 4 ext      Data Reviewed: I have personally reviewed following labs and imaging studies  CBC: Recent Labs  Lab 07/10/17 1120  WBC 9.5  HGB 14.3  HCT 42.0  MCV 91.1  PLT 485   Basic Metabolic Panel: Recent Labs  Lab 07/10/17 1120  NA 132*  K 4.0  CL 96*  CO2 25  GLUCOSE 130*  BUN 10  CREATININE 1.08*  CALCIUM 9.1   GFR: Estimated Creatinine Clearance: 74.1 mL/min (A) (by C-G formula based on SCr of 1.08 mg/dL (H)). Liver Function Tests: Recent Labs  Lab 07/10/17 1120  AST 31  ALT 43  ALKPHOS 105  BILITOT 0.3  PROT 6.0*  ALBUMIN 3.3*   No results for input(s): LIPASE, AMYLASE in the last 168 hours. No results for input(s): AMMONIA in the last 168 hours. Coagulation  Profile: No results for input(s): INR, PROTIME in the last 168 hours. Cardiac Enzymes: No results for input(s): CKTOTAL, CKMB, CKMBINDEX, TROPONINI in the last 168 hours. BNP (last 3 results) No results for input(s): PROBNP in the last 8760 hours. HbA1C: No results for input(s): HGBA1C in the last 72 hours. CBG: Recent Labs  Lab 07/11/17 0640  GLUCAP 142*   Lipid Profile: No results for input(s): CHOL, HDL, LDLCALC, TRIG, CHOLHDL, LDLDIRECT in the last 72 hours. Thyroid Function Tests: No results for input(s): TSH, T4TOTAL, FREET4, T3FREE, THYROIDAB in the last 72 hours. Anemia Panel: No results for input(s): VITAMINB12, FOLATE, FERRITIN, TIBC, IRON, RETICCTPCT in the last 72 hours. Urine analysis:    Component Value Date/Time   COLORURINE YELLOW 02/20/2016 2221   APPEARANCEUR CLOUDY (A) 02/20/2016 2221   LABSPEC 1.018 02/20/2016 2221   PHURINE 5.5 02/20/2016 2221   GLUCOSEU NEGATIVE 02/20/2016 2221   HGBUR NEGATIVE 02/20/2016 2221   BILIRUBINUR NEGATIVE 02/20/2016 2221   KETONESUR NEGATIVE 02/20/2016 2221   PROTEINUR NEGATIVE 02/20/2016 2221   UROBILINOGEN 0.2 04/18/2013 0950   NITRITE NEGATIVE 02/20/2016 2221   LEUKOCYTESUR NEGATIVE 02/20/2016 2221     )No results found for this or any previous visit (from the past 240 hour(s)).    Anti-infectives (From admission, onward)   Start     Dose/Rate Route Frequency Ordered Stop   07/11/17 0500  vancomycin (VANCOCIN) IVPB 750 mg/150 ml premix     750 mg 150 mL/hr over 60 Minutes Intravenous Every 12 hours 07/10/17 1622     07/10/17 1700  vancomycin (VANCOCIN) 2,000 mg in sodium chloride 0.9 % 500 mL IVPB     2,000 mg 250 mL/hr over 120 Minutes Intravenous  Once 07/10/17 1617 07/10/17 2026       Radiology Studies: Ct Soft Tissue Neck W Contrast  Result Date: 07/10/2017 CLINICAL DATA:  57 y/o  F; hypotension.  Cellulitis of neck. EXAM: CT NECK WITH CONTRAST TECHNIQUE: Multidetector CT imaging of the neck was performed  using the standard protocol following the bolus administration of intravenous contrast. CONTRAST:  100 cc Isovue 370 COMPARISON:  None. FINDINGS: Pharynx and larynx: Normal. No mass or swelling. Salivary glands: No inflammation, mass, or stone. Thyroid: Nodule in right lobe of thyroid measuring 22 mm. Lymph nodes: None enlarged or abnormal density. Vascular: Mixed plaque of left carotid bifurcation with mild 50% proximal ICA stenosis. Several segments of fibrofatty plaque within common carotid arteries without high-grade stenosis. Limited intracranial: Negative. Visualized orbits: Negative. Mastoids and visualized paranasal sinuses: Mild ethmoid and maxillary sinus mucosal thickening. Normal aeration of mastoid air cells. Skeleton: Mild cervical spondylosis with multilevel disc and facet degenerative changes. Uncovertebral and facet hypertrophy results in multiple left-sided narrowing of bony neural foramen. Upper chest: Negative. Other: Multiloculated abscess centered within the posterior lower neck subcutaneous fat measuring 1.5 x 4.1 x 3.0 cm in  conglomerate (AP x ML x CC series 1, image 39 and series 6, image 39). There is a sinus tract extending to the skin surface. Surrounding inflammation is present within subcutaneous fat. The abscess does not extend into the deep cervical soft tissues. IMPRESSION: Multiloculated abscess centered within posterior lower neck subcutaneous fat with sinus tract to skin surface measuring up to 4.1 cm. Electronically Signed   By: Kristine Garbe M.D.   On: 07/10/2017 18:27   Ct Angio Chest/abd/pel For Dissection W And/or Wo Contrast  Result Date: 07/10/2017 CLINICAL DATA:  Hypotension.  Neck cellulitis. EXAM: CT ANGIOGRAPHY CHEST, ABDOMEN AND PELVIS TECHNIQUE: Multidetector CT imaging through the chest, abdomen and pelvis was performed using the standard protocol during bolus administration of intravenous contrast. Multiplanar reconstructed images and MIPs were  obtained and reviewed to evaluate the vascular anatomy. CONTRAST:  100 mL Isovue 370 COMPARISON:  CTs of the lumbar spine and right hip 06/17/2017 FINDINGS: CTA CHEST FINDINGS Cardiovascular: There is no evidence of thoracic aortic intramural hematoma on precontrast images. The thoracic aorta is normal in caliber without aneurysm or dissection. The heart is normal in size. There is lipomatous hypertrophy of the interatrial septum. There is no pericardial effusion. No central pulmonary emboli are identified on this nondedicated study. Mediastinum/Nodes: Heterogeneity of the right greater than left thyroid lobes with assessment limited by streak artifact from venous contrast. No enlarged axillary or mediastinal lymph nodes. Subcentimeter hilar lymph nodes bilaterally, slightly more prominent on the right. Small sliding hiatal hernia. Lungs/Pleura: No pleural effusion or pneumothorax. Evaluation of the lung parenchyma is mildly limited by respiratory motion artifact. There is mild bronchial wall thickening bilaterally and minimal dependent atelectasis bilaterally. No mass. Musculoskeletal: No acute osseous abnormality or suspicious osseous lesion. Review of the MIP images confirms the above findings. CTA ABDOMEN AND PELVIS FINDINGS VASCULAR Aorta: Patent with mild diffuse atherosclerotic irregularity. No aneurysm or dissection. Celiac: Patent without evidence of aneurysm, dissection, vasculitis or significant stenosis. Accessory right hepatic artery arises from the left gastric artery, a normal variant. SMA: Patent with less than 50% narrowing proximally.  No dissection. Renals: Single renal arteries bilaterally are patent without evidence of dissection. 65% stenosis of the proximal right main renal artery. IMA: Patent without evidence of aneurysm, dissection, vasculitis or significant stenosis. Inflow: Patent with mild atherosclerosis bilaterally. No significant stenosis or dissection. Veins: No obvious venous  abnormality within the limitations of this arterial phase study. Review of the MIP images confirms the above findings. NON-VASCULAR Hepatobiliary: No focal liver abnormality is seen. No gallstones, gallbladder wall thickening, or biliary dilatation. Pancreas: Unremarkable. Spleen: Unremarkable. Adrenals/Urinary Tract: Unremarkable adrenal glands. No hydronephrosis. Subcentimeter right renal hypodensity, too small to fully characterize. Unremarkable bladder. Stomach/Bowel: No bowel obstruction or gross wall thickening. Appendix not clearly visualized, however no inflammatory changes are seen to suggest appendicitis. Lymphatic: No enlarged lymph nodes. Reproductive: Uterus and bilateral adnexa are unremarkable. Other: No intraperitoneal free fluid.  No abdominal wall hernia. Musculoskeletal: Prior lumbar fusion with advanced disc degeneration above and below the fused segment. Mild-to-moderate lumbar levoscoliosis. Review of the MIP images confirms the above findings. IMPRESSION: 1. No aortic aneurysm or dissection. Aortic Atherosclerosis (ICD10-I70.0). 2. 65% right renal artery stenosis. 3. Bronchial wall thickening. 4. Small hiatal hernia. Electronically Signed   By: Logan Bores M.D.   On: 07/10/2017 18:43        Scheduled Meds: . acidophilus  1 capsule Oral Daily  . amitriptyline  25 mg Oral QHS  . atorvastatin  20  mg Oral Daily  . budesonide  9 mg Oral Daily  . cholecalciferol  1,000 Units Oral Daily  . letrozole  2.5 mg Oral Daily  . multivitamin with minerals  1 tablet Oral Daily  . sodium chloride flush  3 mL Intravenous Q12H   Continuous Infusions: . sodium chloride 75 mL/hr at 07/11/17 0800  . vancomycin 750 mg (07/11/17 0548)     LOS: 0 days    Time spent: 35 min    Geradine Girt, DO [facility] Pager [phone]  If 7PM-7AM, please contact night-coverage [email or web address] Password [facility] 07/11/2017, 11:42 AM

## 2017-07-11 NOTE — Transfer of Care (Signed)
Immediate Anesthesia Transfer of Care Note  Patient: [name]  Procedure(s) Performed: LEFT EXTREME LATERAL INTERBODY FUSION AND INSTRUMENTATION AND ALLOGRAFT LUMBAR 1-2/2-3 (Left )  Patient Location: PACU  Anesthesia Type:General  Level of Consciousness: awake  Airway & Oxygen Therapy: Patient Spontanous Breathing and Patient connected to face mask oxygen  Post-op Assessment: Report given to RN and Post -op Vital signs reviewed and stable  Post vital signs: Reviewed and stable  Last Vitals:  Vitals:   07/11/17 1243 07/11/17 1718  BP: 102/76   Pulse: 87   Resp: 19   Temp: 36.9 C (P) 36.5 C  SpO2: 94%     Last Pain:  Vitals:   07/11/17 1243  TempSrc: Oral  PainSc: 6          Complications: No apparent anesthesia complications

## 2017-07-11 NOTE — Anesthesia Procedure Notes (Signed)
Procedure Name: Intubation Date/Time: 07/11/2017 1:29 PM Performed by: Freddie Breech, CRNA Pre-anesthesia Checklist: Patient identified, Emergency Drugs available, Suction available and Patient being monitored Patient Re-evaluated:Patient Re-evaluated prior to induction Oxygen Delivery Method: Circle System Utilized Preoxygenation: Pre-oxygenation with 100% oxygen Induction Type: IV induction Ventilation: Mask ventilation without difficulty Laryngoscope Size: Mac and 4 Grade View: Grade I Tube type: Oral Tube size: 7.5 mm Number of attempts: 1 Airway Equipment and Method: Stylet and Oral airway Placement Confirmation: ETT inserted through vocal cords under direct vision,  positive ETCO2 and breath sounds checked- equal and bilateral Secured at: 21 cm Tube secured with: Tape Dental Injury: Teeth and Oropharynx as per pre-operative assessment

## 2017-07-11 NOTE — Anesthesia Postprocedure Evaluation (Signed)
Anesthesia Post Note  Patient: [name]  Procedure(s) Performed: LEFT EXTREME LATERAL INTERBODY FUSION AND INSTRUMENTATION AND ALLOGRAFT LUMBAR 1-2/2-3 (Left )     Patient location during evaluation: PACU Anesthesia Type: General Level of consciousness: awake and alert Pain management: pain level controlled Vital Signs Assessment: post-procedure vital signs reviewed and stable Respiratory status: spontaneous breathing, nonlabored ventilation, respiratory function stable and patient connected to nasal cannula oxygen Cardiovascular status: blood pressure returned to baseline and stable Postop Assessment: no apparent nausea or vomiting Anesthetic complications: no    Last Vitals:  Vitals:   07/11/17 1803 07/11/17 1817  BP: 108/79 104/62  Pulse: 90 91  Resp: (!) 27 15  Temp:    SpO2: 99% 96%    Last Pain:  Vitals:   07/11/17 1811  TempSrc:   PainSc: 10-Worst pain ever                 Sheryl Saintil COKER

## 2017-07-11 NOTE — Plan of Care (Signed)
  Activity: Ability to avoid complications of mobility impairment will improve 07/11/2017 2244 - Progressing by Mikey College, RN   Activity: Ability to tolerate increased activity will improve 07/11/2017 2244 - Progressing by Mikey College, RN   Education: Ability to verbalize activity precautions or restrictions will improve 07/11/2017 2244 - Progressing by Mikey College, RN   Education: Knowledge of the prescribed therapeutic regimen will improve 07/11/2017 2244 - Progressing by Mikey College, RN   Pain Management: Pain level will decrease 07/11/2017 2244 - Progressing by Mikey College, RN   Physical Regulation: Postoperative complications will be avoided or minimized 07/11/2017 2244 - Progressing by Mikey College, RN

## 2017-07-11 NOTE — Progress Notes (Signed)
CC:  Neck abscess  Subjective: She is doing OK, the neck site is open and deeper than it is packed.  I explained it to the nurse, not much cellulitis and not much drainage.  It is open and I don't think it needs to be opened more. We just need to be sure packing goes to the base.   Objective: Vital signs in last 24 hours: Temp:  [98 F (36.7 C)-99.1 F (37.3 C)] 98.2 F (36.8 C) (03/06 0050) Pulse Rate:  [71-102] 93 (03/06 0500) Resp:  [11-24] 18 (03/06 0500) BP: (72-122)/(39-89) 108/79 (03/06 0830) SpO2:  [86 %-100 %] 93 % (03/06 0830) Weight:  [104.3 kg (230 lb)-106.8 kg (235 lb 7.2 oz)] 106.8 kg (235 lb 7.2 oz) (03/06 0500) Last BM Date: 07/10/17 750 IV Urine x 1 Stool x 1 Afebrile, VSS, BP down on admit, but stable currently Na 132, glucose 130, Creatinine 1.08 Lactate:  1.2 WBC 9.5 No labs this AM  Neck CT:  Multiloculated abscess centered within posterior lower neck subcutaneous fat with sinus tract to skin surface measuring up to 4.1 cm.  CT chest:  No aortic aneurysm or dissection. Aortic Atherosclerosis.  65% right renal artery stenosis.   Bronchial wall thickening.   Small hiatal hernia.   Intake/Output from previous day: 03/05 0701 - 03/06 0700 In: 751.8 [I.V.:601.8; IV Piggyback:150] Out: -  Intake/Output this shift: Total I/O In: 165 [I.V.:165] Out: -   General appearance: alert, cooperative and no distress Skin: Open site has some cellulitis but not much.  The abscess is about 2 cm deep and goes to the left.  No significant drainage, and I did not feel any locutlated spaces that needed additional opening.    Lab Results:  Recent Labs    07/10/17 1120  WBC 9.5  HGB 14.3  HCT 42.0  PLT 242    BMET Recent Labs    07/10/17 1120  NA 132*  K 4.0  CL 96*  CO2 25  GLUCOSE 130*  BUN 10  CREATININE 1.08*  CALCIUM 9.1   PT/INR No results for input(s): LABPROT, INR in the last 72 hours.  Recent Labs  Lab 07/10/17 1120  AST 31  ALT 43   ALKPHOS 105  BILITOT 0.3  PROT 6.0*  ALBUMIN 3.3*     Lipase  No results found for: LIPASE   Medications: . acidophilus  1 capsule Oral Daily  . amitriptyline  25 mg Oral QHS  . atorvastatin  20 mg Oral Daily  . budesonide  9 mg Oral Daily  . cholecalciferol  1,000 Units Oral Daily  . losartan  100 mg Oral Daily   And  . hydrochlorothiazide  12.5 mg Oral Daily  . letrozole  2.5 mg Oral Daily  . multivitamin with minerals  1 tablet Oral Daily  . sodium chloride flush  3 mL Intravenous Q12H   . sodium chloride    . sodium chloride 75 mL/hr at 07/11/17 0800  . vancomycin 750 mg (07/11/17 0548)   Anti-infectives (From admission, onward)   Start     Dose/Rate Route Frequency Ordered Stop   07/11/17 0500  vancomycin (VANCOCIN) IVPB 750 mg/150 ml premix     750 mg 150 mL/hr over 60 Minutes Intravenous Every 12 hours 07/10/17 1622     07/10/17 1700  vancomycin (VANCOCIN) 2,000 mg in sodium chloride 0.9 % 500 mL IVPB     2,000 mg 250 mL/hr over 120 Minutes Intravenous  Once 07/10/17 1617  07/10/17 2026    PCP: [name], MD   Assessment/Plan  Spinal stenosis with lumbar region pain Hypertension Left Breast Cancer with lumpectomy - radiation Rx Hx of depression Prior lumbar fusion Tobacco use  Neck Abscess with I&D 07/04/17/MRSA  FEN:  IV fluids/regular diet ID:  Septra 2/24 >>   Vancomycin 3/5 =>> day 2 DVT:  SCD's Foley:  None Follow up:  PCP  Plan:  I reviewed dressing instructions with the nursing staff.  I will increase her pain medicine for this.  I will get her into the shower if OK with Primary, so we can get some soap and water into the site.  I re-cultured it to be sure we do not have another organism to deal with.  Currently she is just on Vancomycin.         LOS: 0 days    Mayu Ronk 07/11/2017 [phone]

## 2017-07-11 NOTE — Progress Notes (Signed)
Patient arrived from ED around 2100 alert and oriented lower back pain may have back surgery 3/6, also has abscess neck incision made posteriorly for drainage, I changed bandage and put wick in the 3 cm incision. She is complaining of back pain she was medicated. Her BP has been soft since arrival to [place] is on, SCD's ordered. No orders in for surgery, will defer surgical pre procedure to day shift patient will be NPO at midnight.

## 2017-07-11 NOTE — Op Note (Signed)
[name], [name]       MEDICAL RECORD NO.:  [id number]  PHYSICIAN:  Phylliss Bob, MD      DATE OF BIRTH:  [birth year]  DATE OF PROCEDURE:  07/11/2017                              OPERATIVE REPORT   PREOPERATIVE DIAGNOSES: 1. Severe right-sided lumbar radiculopathy. 2. Severe right-sided nerve compression L1, L2. 3. Lumbar degenerative scoliosis. 4. Status post previous L3-4 decompression and fusion by another     provider.  POSTOPERATIVE DIAGNOSES: 1. Severe right-sided lumbar radiculopathy. 2. Severe right-sided nerve compression L1, L2. 3. Lumbar degenerative scoliosis. 4. Status post previous L3-4 decompression and fusion by another     provider.  PROCEDURE: 1. Direct lateral interbody fusion, via a left-sided approach, L1-2,     L2-3. 2. Placement of anterior instrumentation, L1-2. 3. Insertion of interbody device x2 (NuVasive intervertebral spacers     x2). 4. Intraoperative use of fluoroscopy.  SURGEON:  Phylliss Bob, MD.  ASSISTANTPricilla Holm, PA-C.  ANESTHESIA:  General endotracheal anesthesia.  COMPLICATIONS:  None.  DISPOSITION:  Stable.  ESTIMATED BLOOD LOSS:  Minimal.  INDICATIONS FOR SURGERY:  Briefly, Ms. Cordoba is a pleasant 57 year old female who did present to me in the office this past week with severe pain in her right leg.  The pain was debilitating and severely limiting her function.  She also had some profound weakness in the right leg as well.  The pain was unrelenting, and we did discuss proceeding with an elective procedure.  However, the pain became more severe to where the patient felt unable to care for herself, and she did present to the emergency department with her severe pain.  Given her ongoing pain, we did discuss proceeding with procedure the following day, which was today, on July 11, 2017.  Of note, the patient was noted to have intermittent hypotension in the emergency department, but was, however, worked up  by the Internal Medicine Team and was felt to be stable to proceed with surgery.  She also was noted to have an abscess in her posterior cervical region superficially.  This was evaluated by General Surgery, and it was felt that the patient was not septic and then it was safe for her to proceed with surgery.  The patient did present therefore today for stage I of what was to be a 2-stage procedure.  She did understand that stage II would involve a posterior decompression and fusion on the following day, on July 12, 2017.  OPERATIVE DETAILS:  On July 11, 2017, the patient was brought to surgery and general endotracheal anesthesia was administered.  The patient was placed in the lateral decubitus position with the left side up.  The patient's hips and knees were flexed and she was secured to the bed. Her left arm was placed on an arm board.  All bony prominences were meticulously padded.  The bed was appropriately angled in order to ensure a perfect AP and lateral trajectory during surgery.  The patient's left flank was then prepped and draped in the usual sterile fashion.  A time-out procedure was performed.  Of note, the patient was on vancomycin, and also received Ancef prior to the incision.  At this point, a left-sided incision was made between the L1-2 and L2-3 intervertebral spaces.  The external and internal oblique musculature was dissected, and the transversalis fascia was  entered, and the retroperitoneal space was noted.  The peritoneum was bluntly swept anteriorly.  I then was able to identify the psoas muscle.  I did use the initial dilator, which was advanced through the psoas muscle, liberally using neurologic monitoring.  There were no neurologic structures in the immediate vicinity of the dilator.  I then sequentially dilated and placed a self-retaining retractor, which was secured to the bed using a rigid arm.  The retractor was slightly opened.  Again, using triggered  EMG, it was clear that there were no neurologic structures in the immediate vicinity of the intervertebral disk space.  At this point, I performed an annulotomy and I did release the contralateral annulus.  I then performed a thorough and complete L2- 3 intervertebral diskectomy.  I then placed a series of trials.  I did ultimately elect to place an implant, 10 mm in height.  The implant was liberally packed with ViviGen and was tamped into position.  Of note, I did note excellent restoration of the segmental collapse on the right side upon advancing the implant across the right side of the intervertebral space.  The self-retaining retractor was then removed.  I then again entered through the psoas musculature, and docked over the L1- 2 intervertebral space.  I then sequentially dilated and placed a self- retaining retractor.  Again, I did liberally use EMG testing, and there were no neurologic structures noted in the immediate vicinity of the retractor.  I then again performed annulotomy and then performed a thorough complete L1-2 intervertebral diskectomy.  Once again, the contralateral annulus was released.  I then placed a series of trials and I did ultimately elect to place a 10-mm implant, after packing it liberally with ViviGen.  I was very pleased with the press-fit of the implant.  The break in the bed was then undone and the bed was flattened.  A 10-mm anterior lumbar plate was placed over the lateral aspect of the L1-2 level.  I then placed screws into the L1 and L2 vertebral bodies.  The screws were then locked to the plate.  I did note excellent purchase of each of the screws.  The plate was then locked.  I was very pleased with the final AP and lateral fluoroscopic images, and I was particularly pleased with the excellent restoration of coronal alignment.  The wound was then copiously irrigated.  The wound was then closed in layers using #1 Vicryl, followed by 2-0 Vicryl,  followed by 4- 0 Monocryl.  Benzoin and Steri-Strips were applied followed by a sterile dressing.  All instrument counts were correct at the termination of the procedure.  Of note, Pricilla Holm, was my assistant throughout surgery, and did aid in retraction, suctioning, and closure from start to finish.     Phylliss Bob, MD     MD/MEDQ  D:  07/11/2017  T:  07/11/2017  Job:  [id number]

## 2017-07-11 NOTE — Anesthesia Preprocedure Evaluation (Addendum)
Anesthesia Evaluation  Patient identified by MRN, date of birth, ID band Patient awake    Reviewed: Allergy & Precautions, NPO status , Patient's Chart, lab work & pertinent test results  History of Anesthesia Complications (+) PONV  Airway Mallampati: III  TM Distance: >3 FB Neck ROM: Full    Dental  (+) Edentulous Upper, Edentulous Lower   Pulmonary Current Smoker,    breath sounds clear to auscultation       Cardiovascular hypertension, Pt. on medications + Peripheral Vascular Disease   Rhythm:Regular Rate:Normal  EKG - SR  '13 Carotid US - 40-60% right ICAS stenosis, no significant left ICAS  '13 TTE - Normal EF, grade 1 diastolic dysfunction, mild LVH   Neuro/Psych  Headaches, PSYCHIATRIC DISORDERS Anxiety Depression TIA   GI/Hepatic negative GI ROS, Neg liver ROS,   Endo/Other  obesity  Renal/GU Renal InsufficiencyRenal disease  negative genitourinary   Musculoskeletal  (+) Arthritis ,   Abdominal (+) + obese,   Peds negative pediatric ROS (+)  Hematology negative hematology ROS (+)   Anesthesia Other Findings Breast cancer s/p radiation  Reproductive/Obstetrics                           Anesthesia Physical  Anesthesia Plan  ASA: II  Anesthesia Plan: General   Post-op Pain Management:    Induction: Intravenous  PONV Risk Score and Plan: 4 or greater and Treatment may vary due to age or medical condition, Ondansetron, Midazolam, Scopolamine patch - Pre-op and Propofol infusion  Airway Management Planned: Oral ETT  Additional Equipment: None  Intra-op Plan:   Post-operative Plan: Extubation in OR  Informed Consent: I have reviewed the patients History and Physical, chart, labs and discussed the procedure including the risks, benefits and alternatives for the proposed anesthesia with the patient or authorized representative who has indicated his/her understanding and  acceptance.   Dental advisory given  Plan Discussed with: CRNA  Anesthesia Plan Comments:         Anesthesia Quick Evaluation

## 2017-07-11 NOTE — H&P (Signed)
PREOPERATIVE H&P  Chief Complaint: Right leg pain  HPI: [name] is a 57 y.o. female who presents with ongoing pain in the right thigh. Patient's pain became very severe and she presented to the ED as a result of her pain.  In the emergency department, patient was diagnosed with a superficial abscess in the posterior cervical region.  The patient was evaluated by general surgery, Dr. Kae Heller, who did not feel that any urgent intervention was needed for this.  Patient was then admitted to the internal medicine team, who did not identify any contraindication for surgical intervention for her right thigh pain and her severe lumbar radiculopathy.  Patient does continue to have ongoing severe pain in her right thigh, rated at 10 out of 10.  Her MRI reveals severe nerve compression on the right at L1 and L2.  The patient is status post a previous L3-4 fusion procedure, and is noted to have a degenerative lumbar scoliosis, with severe segmental collapse on the right at L2-3.  She does feel that her pain has been increasing over the last month.  Patient has failed multiple forms of conservative care and continues to have pain (see office notes for additional details regarding the patient's full course of treatment)  Past Medical History:  Diagnosis Date  . Anxiety   . Arthritis    lower back  . Breast cancer ([place])   . Breast cancer of upper-outer quadrant of left female breast ([place]) 01/03/2016  . Complication of anesthesia    hx. of combativeness as child; had severe migraine headache day after back surgery  . Cough due to ACE inhibitor 01/28/2016  . Depression   . Eczema    right hip  . High cholesterol   . History of radiation therapy 05/29/16- 07/14/16   Left Breast/ 50.4 Gy n 25 fractions, Left Breast Boost/ 10 Gy in 5 fractions.   . Hypertension    states under control with meds., has been on med. since age 2  . Lymphocytic colitis   . Migraines   . Personal history of radiation  therapy   . PONV (postoperative nausea and vomiting)   . Sciatica   . Sinus drainage 01/28/2016  . Urinary urgency    Past Surgical History:  Procedure Laterality Date  . BREAST BIOPSY    . BREAST LUMPECTOMY     left 2017  . BREAST LUMPECTOMY WITH NEEDLE LOCALIZATION AND AXILLARY SENTINEL LYMPH NODE BX Left 02/04/2016   Procedure: LEFT BREAST LUMPECTOMY WITH 2 NEEDLE LOCALIZATIONS AND LEFT AXILLARY SENTINEL LYMPH NODE BIOPSY;  Surgeon: [name], MD;  Location: [facility];  Service: General;  Laterality: Left;  LEFT BREAST LUMPECTOMY WITH 2 NEEDLE LOCALIZATIONS AND LEFT AXILLARY SENTINEL LYMPH NODE BIOPSY  . [place]  . COLONOSCOPY     Hx: of  . CYSTO  09/29/2005   with anterior repair  . ENDOMETRIAL ABLATION  09/29/2005  . INCISION AND DRAINAGE ABSCESS Left 02/21/2016   Procedure: INCISION AND DRAINAGE ABSCESS;  Surgeon: Excell Seltzer, MD;  Location: [facility];  Service: General;  Laterality: Left;  . INCONTINENCE SURGERY  09/29/2005  . KNEE ARTHROSCOPY W/ DEBRIDEMENT    . LUMBAR FUSION  04/28/2013   L3-4  . TONSILLECTOMY     age 81  . TUBAL LIGATION    . WISDOM TOOTH EXTRACTION     as a teenager   Social History   Socioeconomic History  . Marital status: Divorced  Spouse name: None  . Number of children: 2  . Years of education: None  . Highest education level: None  Social Needs  . Financial resource strain: None  . Food insecurity - worry: None  . Food insecurity - inability: None  . Transportation needs - medical: None  . Transportation needs - non-medical: None  Occupational History  . Occupation: Radiographer, therapeutic: [employer]  Tobacco Use  . Smoking status: Current Every Day Smoker    Packs/day: 1.00    Years: 12.00    Pack years: 12.00    Types: Cigarettes  . Smokeless tobacco: Never Used  . Tobacco comment: Tobacco info given 04/04/17  Substance and Sexual Activity  . Alcohol use: No  . Drug use: No  . Sexual  activity: No  Other Topics Concern  . None  Social History Narrative  . None   Family History  Problem Relation Age of Onset  . Colon cancer Father        35-65  . Hypertension Father   . Colon polyps Unknown   . Diabetes Unknown   . Breast cancer Paternal Aunt    Allergies  Allergen Reactions  . Soap Shortness Of Breath and Rash    IVORY SOAP  . Adhesive [Tape] Other (See Comments)    BLISTERS  . Augmentin [Amoxicillin-Pot Clavulanate] Nausea And Vomiting    Has patient had a PCN reaction causing immediate rash, facial/tongue/throat swelling, SOB or lightheadedness with hypotension: Unknown Has patient had a PCN reaction causing severe rash involving mucus membranes or skin necrosis: Unknown Has patient had a PCN reaction that required hospitalization Unknown Has patient had a PCN reaction occurring within the last 10 years: Unknown If all of the above answers are "NO", then may proceed with Cephalosporin use.   Marland Kitchen Cymbalta [Duloxetine Hcl] Nausea Only  . Latex Other (See Comments) and Itching    REACTION: blisters BLISTERS  . Nsaids Other (See Comments)    LYMPHOCYTIC COLITIS  . Oxycodone Nausea Only    Not on an empty stomach  . Chlorhexidine Gluconate Rash    Pt says her skin looked like poison ivy  . Mupirocin Rash   Prior to Admission medications   Medication Sig Start Date End Date Taking? Authorizing Provider  amitriptyline (ELAVIL) 25 MG tablet Take 25 mg by mouth at bedtime.   Yes [provider]  amLODipine (NORVASC) 5 MG tablet Take 5 mg by mouth daily.   Yes [provider]  aspirin-acetaminophen-caffeine (EXCEDRIN MIGRAINE) [phone] MG tablet Take 2 tablets by mouth every 6 (six) hours as needed for headache.   Yes [provider]  atorvastatin (LIPITOR) 20 MG tablet Take 20 mg by mouth daily.   Yes [provider]  budesonide (ENTOCORT EC) 3 MG 24 hr capsule Take 3 capsules (9 mg total) by mouth daily. 04/11/17  Yes  Zehr, Laban Emperor, PA-C  cholecalciferol (VITAMIN D) 1000 units tablet Take 1,000 Units by mouth daily.   Yes [provider]  letrozole (FEMARA) 2.5 MG tablet Take 1 tablet (2.5 mg total) by mouth daily. 09/22/16  Yes Causey, Charlestine Massed, NP  losartan-hydrochlorothiazide (HYZAAR) 100-12.5 MG tablet Take 1 tablet by mouth daily. 10/16/16  Yes [provider]  Multiple Vitamins-Minerals (MULTIVITAMIN ADULT PO) Take 1 tablet by mouth daily.    Yes [provider]  oxyCODONE-acetaminophen (PERCOCET/ROXICET) 5-325 MG tablet Take 1 tablet by mouth every 4 (four) hours as needed for severe pain. 06/20/17  Yes Gudena,  Loleta Dicker, MD  Probiotic Product (PROBIOTIC DAILY PO) Take 1 capsule by mouth daily.    Yes [provider]  sulfamethoxazole-trimethoprim (BACTRIM DS) 800-160 MG tablet Take 1 tablet by mouth 2 (two) times daily for 10 days. 07/01/17 07/11/17 Yes Zigmund Gottron, NP     All other systems have been reviewed and were otherwise negative with the exception of those mentioned in the HPI and as above.  Physical Exam: Vitals:   07/11/17 0050 07/11/17 0500  BP: 98/61 122/65  Pulse: 71 93  Resp: 16 18  Temp: 98.2 F (36.8 C)   SpO2: 98% 95%    Body mass index is 36.88 kg/m.  General: Alert, no acute distress Cardiovascular: No pedal edema Respiratory: No cyanosis, no use of accessory musculature Skin: No lesions in the area of chief complaint Neurologic: Sensation intact distally Psychiatric: Patient is competent for consent with normal mood and affect Lymphatic: No axillary or cervical lymphadenopathy  MUSCULOSKELETAL: weakness to right hip flexion  Assessment/Plan: Severe right-sided lumbar radiculopathy secondary to the patient's lumbar degenerative scoliosis and severe right-sided nerve compression involving L1 and L2, the 2 levels above her previous fusion Plan for Procedure(s): LEFT LATERAL INTERBODY FUSION WITH INSTRUMENTATION AND ALLOGRAFT  L1-2, L2-3   Sinclair Ship, MD 07/11/2017 7:42 AM

## 2017-07-11 NOTE — Progress Notes (Signed)
Placed on tele to monitor  

## 2017-07-12 ENCOUNTER — Inpatient Hospital Stay (HOSPITAL_COMMUNITY): Payer: Medicaid Other

## 2017-07-12 ENCOUNTER — Encounter (HOSPITAL_COMMUNITY)
Admission: EM | Disposition: A | Discharge status: Home Health | Payer: Self-pay | Source: Home / Self Care | Attending: Internal Medicine

## 2017-07-12 ENCOUNTER — Encounter (HOSPITAL_COMMUNITY): Payer: Self-pay | Admitting: General Practice

## 2017-07-12 ENCOUNTER — Inpatient Hospital Stay (HOSPITAL_COMMUNITY): Payer: Medicaid Other | Admitting: Certified Registered Nurse Anesthetist

## 2017-07-12 LAB — CBC
HCT: 38.8 % (ref 36.0–46.0)
Hemoglobin: 12.4 g/dL (ref 12.0–15.0)
MCH: 29.7 pg (ref 26.0–34.0)
MCHC: 32 g/dL (ref 30.0–36.0)
MCV: 93 fL (ref 78.0–100.0)
PLATELETS: 170 10*3/uL (ref 150–400)
RBC: 4.17 MIL/uL (ref 3.87–5.11)
RDW: 14.1 % (ref 11.5–15.5)
WBC: 6.9 10*3/uL (ref 4.0–10.5)

## 2017-07-12 LAB — GLUCOSE, CAPILLARY: GLUCOSE-CAPILLARY: 128 mg/dL — AB (ref 65–99)

## 2017-07-12 LAB — BASIC METABOLIC PANEL
Anion gap: 8 (ref 5–15)
BUN: 8 mg/dL (ref 6–20)
CHLORIDE: 103 mmol/L (ref 101–111)
CO2: 26 mmol/L (ref 22–32)
CREATININE: 0.97 mg/dL (ref 0.44–1.00)
Calcium: 8.3 mg/dL — ABNORMAL LOW (ref 8.9–10.3)
GFR calc Af Amer: >60 mL/min (ref >60)
Glucose, Bld: 132 mg/dL — ABNORMAL HIGH (ref 65–99)
Potassium: 4.5 mmol/L (ref 3.5–5.1)
SODIUM: 137 mmol/L (ref 135–145)

## 2017-07-12 SURGERY — POSTERIOR LUMBAR FUSION 2 LEVEL
Anesthesia: General | Site: Spine Lumbar | Laterality: Right

## 2017-07-12 MED ORDER — SURGIFOAM 100 EX MISC
CUTANEOUS | Status: DC | PRN
  Administered 2017-07-12: 20 mL via TOPICAL

## 2017-07-12 MED ORDER — ROCURONIUM BROMIDE 100 MG/10ML IV SOLN
INTRAVENOUS | Status: DC | PRN
  Administered 2017-07-12: 30 mg via INTRAVENOUS
  Administered 2017-07-12 (×3): 10 mg via INTRAVENOUS
  Administered 2017-07-12: 20 mg via INTRAVENOUS
  Administered 2017-07-12: 50 mg via INTRAVENOUS

## 2017-07-12 MED ORDER — SUCCINYLCHOLINE CHLORIDE 200 MG/10ML IV SOSY
PREFILLED_SYRINGE | INTRAVENOUS | Status: AC
  Filled 2017-07-12: qty 10

## 2017-07-12 MED ORDER — PHENYLEPHRINE HCL 10 MG/ML IJ SOLN
INTRAVENOUS | Status: DC | PRN
  Administered 2017-07-12: 40 ug/min via INTRAVENOUS

## 2017-07-12 MED ORDER — MIDAZOLAM HCL 5 MG/5ML IJ SOLN
INTRAMUSCULAR | Status: DC | PRN
  Administered 2017-07-12: 2 mg via INTRAVENOUS

## 2017-07-12 MED ORDER — KETAMINE HCL 10 MG/ML IJ SOLN
INTRAMUSCULAR | Status: DC | PRN
  Administered 2017-07-12: 30 mg via INTRAVENOUS
  Administered 2017-07-12: 20 mg via INTRAVENOUS

## 2017-07-12 MED ORDER — MIDAZOLAM HCL 2 MG/2ML IJ SOLN
INTRAMUSCULAR | Status: AC
  Filled 2017-07-12: qty 2

## 2017-07-12 MED ORDER — THROMBIN 20000 UNITS EX SOLR
CUTANEOUS | Status: AC
  Filled 2017-07-12: qty 20000

## 2017-07-12 MED ORDER — HYDROCODONE-ACETAMINOPHEN 7.5-325 MG PO TABS: 1.0000 | ORAL_TABLET | Freq: Once | ORAL | Status: DC | PRN

## 2017-07-12 MED ORDER — BUPIVACAINE-EPINEPHRINE 0.25% -1:200000 IJ SOLN
INTRAMUSCULAR | Status: DC | PRN
  Administered 2017-07-12: 50 mL

## 2017-07-12 MED ORDER — PROPOFOL 10 MG/ML IV BOLUS
INTRAVENOUS | Status: AC
  Filled 2017-07-12: qty 20

## 2017-07-12 MED ORDER — LIDOCAINE HCL (CARDIAC) 20 MG/ML IV SOLN
INTRAVENOUS | Status: DC | PRN
  Administered 2017-07-12: 100 mg via INTRAVENOUS

## 2017-07-12 MED ORDER — BUDESONIDE 3 MG PO CPEP
9.0000 mg | ORAL_CAPSULE | Freq: Every day | ORAL | Status: DC
  Administered 2017-07-12 – 2017-07-13 (×2): 9 mg via ORAL
  Filled 2017-07-12 (×3): qty 3

## 2017-07-12 MED ORDER — PHENYLEPHRINE HCL 10 MG/ML IJ SOLN
INTRAMUSCULAR | Status: DC | PRN
  Administered 2017-07-12: 100 ug via INTRAVENOUS
  Administered 2017-07-12 (×2): 120 ug via INTRAVENOUS
  Administered 2017-07-12: 200 ug via INTRAVENOUS
  Administered 2017-07-12: 120 ug via INTRAVENOUS

## 2017-07-12 MED ORDER — LACTATED RINGERS IV SOLN
INTRAVENOUS | Status: DC
  Administered 2017-07-12: 13:00:00 via INTRAVENOUS

## 2017-07-12 MED ORDER — SODIUM CHLORIDE 0.9 % IV SOLN
INTRAVENOUS | Status: AC | PRN
  Administered 2017-07-12: 1 mg via INTRAVENOUS

## 2017-07-12 MED ORDER — SCOPOLAMINE 1 MG/3DAYS TD PT72
1.0000 | MEDICATED_PATCH | TRANSDERMAL | Status: DC
  Administered 2017-07-12: 1.5 mg via TRANSDERMAL

## 2017-07-12 MED ORDER — MORPHINE SULFATE (PF) 4 MG/ML IV SOLN
INTRAVENOUS | Status: AC
  Administered 2017-07-12: 2 mg
  Filled 2017-07-12: qty 1

## 2017-07-12 MED ORDER — 0.9 % SODIUM CHLORIDE (POUR BTL) OPTIME
TOPICAL | Status: DC | PRN
  Administered 2017-07-12 (×2): 1000 mL

## 2017-07-12 MED ORDER — MEPERIDINE HCL 50 MG/ML IJ SOLN: 6.2500 mg | INTRAMUSCULAR | Status: DC | PRN

## 2017-07-12 MED ORDER — CEFAZOLIN SODIUM-DEXTROSE 2-3 GM-%(50ML) IV SOLR
INTRAVENOUS | Status: DC | PRN
  Administered 2017-07-12: 2 g via INTRAVENOUS

## 2017-07-12 MED ORDER — BUPIVACAINE LIPOSOME 1.3 % IJ SUSP
20.0000 mL | Freq: Once | INTRAMUSCULAR | Status: DC
  Filled 2017-07-12: qty 20

## 2017-07-12 MED ORDER — EPHEDRINE 5 MG/ML INJ
INTRAVENOUS | Status: AC
  Filled 2017-07-12: qty 10

## 2017-07-12 MED ORDER — SUGAMMADEX SODIUM 200 MG/2ML IV SOLN
INTRAVENOUS | Status: AC
  Filled 2017-07-12: qty 2

## 2017-07-12 MED ORDER — SUGAMMADEX SODIUM 200 MG/2ML IV SOLN
INTRAVENOUS | Status: DC | PRN
  Administered 2017-07-12: 200 mg via INTRAVENOUS

## 2017-07-12 MED ORDER — ACETAMINOPHEN 10 MG/ML IV SOLN: 1000.0000 mg | Freq: Once | INTRAVENOUS | Status: DC | PRN

## 2017-07-12 MED ORDER — BUPIVACAINE LIPOSOME 1.3 % IJ SUSP
INTRAMUSCULAR | Status: DC | PRN
  Administered 2017-07-12: 20 mL

## 2017-07-12 MED ORDER — PROPOFOL 500 MG/50ML IV EMUL
INTRAVENOUS | Status: DC | PRN
  Administered 2017-07-12: 25 ug/kg/min via INTRAVENOUS

## 2017-07-12 MED ORDER — MORPHINE SULFATE (PF) 2 MG/ML IV SOLN: 2.0000 mg | Freq: Once | INTRAVENOUS | Status: DC

## 2017-07-12 MED ORDER — DEXMEDETOMIDINE HCL 200 MCG/2ML IV SOLN
INTRAVENOUS | Status: DC | PRN
  Administered 2017-07-12: 8 ug via INTRAVENOUS
  Administered 2017-07-12: 16 ug via INTRAVENOUS
  Administered 2017-07-12: 12 ug via INTRAVENOUS
  Administered 2017-07-12: 16 ug via INTRAVENOUS
  Administered 2017-07-12: 8 ug via INTRAVENOUS

## 2017-07-12 MED ORDER — PROPOFOL 10 MG/ML IV BOLUS
INTRAVENOUS | Status: DC | PRN
  Administered 2017-07-12: 170 mg via INTRAVENOUS

## 2017-07-12 MED ORDER — FENTANYL CITRATE (PF) 250 MCG/5ML IJ SOLN
INTRAMUSCULAR | Status: AC
  Filled 2017-07-12: qty 5

## 2017-07-12 MED ORDER — DEXAMETHASONE SODIUM PHOSPHATE 10 MG/ML IJ SOLN
INTRAMUSCULAR | Status: AC
  Filled 2017-07-12: qty 1

## 2017-07-12 MED ORDER — BUPIVACAINE-EPINEPHRINE 0.25% -1:200000 IJ SOLN
INTRAMUSCULAR | Status: AC
  Filled 2017-07-12: qty 1

## 2017-07-12 MED ORDER — HYDROMORPHONE HCL 1 MG/ML IJ SOLN
0.2500 mg | INTRAMUSCULAR | Status: DC | PRN
  Administered 2017-07-12 (×2): 0.5 mg via INTRAVENOUS

## 2017-07-12 MED ORDER — ONDANSETRON HCL 4 MG/2ML IJ SOLN
INTRAMUSCULAR | Status: DC | PRN
  Administered 2017-07-12: 4 mg via INTRAVENOUS

## 2017-07-12 MED ORDER — DEXAMETHASONE SODIUM PHOSPHATE 10 MG/ML IJ SOLN
INTRAMUSCULAR | Status: DC | PRN
  Administered 2017-07-12: 10 mg via INTRAVENOUS

## 2017-07-12 MED ORDER — METHYLENE BLUE 0.5 % INJ SOLN
INTRAVENOUS | Status: AC
  Filled 2017-07-12: qty 10

## 2017-07-12 MED ORDER — ROCURONIUM BROMIDE 10 MG/ML (PF) SYRINGE
PREFILLED_SYRINGE | INTRAVENOUS | Status: AC
  Filled 2017-07-12: qty 5

## 2017-07-12 MED ORDER — PHENYLEPHRINE 40 MCG/ML (10ML) SYRINGE FOR IV PUSH (FOR BLOOD PRESSURE SUPPORT)
PREFILLED_SYRINGE | INTRAVENOUS | Status: AC
  Filled 2017-07-12: qty 10

## 2017-07-12 MED ORDER — SCOPOLAMINE 1 MG/3DAYS TD PT72
MEDICATED_PATCH | TRANSDERMAL | Status: AC
  Filled 2017-07-12: qty 1

## 2017-07-12 MED ORDER — PROMETHAZINE HCL 25 MG/ML IJ SOLN: 6.2500 mg | INTRAMUSCULAR | Status: DC | PRN

## 2017-07-12 MED ORDER — ONDANSETRON HCL 4 MG/2ML IJ SOLN
INTRAMUSCULAR | Status: AC
  Filled 2017-07-12: qty 2

## 2017-07-12 MED ORDER — HYDROMORPHONE HCL 1 MG/ML IJ SOLN
INTRAMUSCULAR | Status: AC
  Administered 2017-07-12: 0.5 mg via INTRAVENOUS
  Filled 2017-07-12: qty 1

## 2017-07-12 MED ORDER — LIDOCAINE 2% (20 MG/ML) 5 ML SYRINGE
INTRAMUSCULAR | Status: AC
  Filled 2017-07-12: qty 5

## 2017-07-12 MED ORDER — KETAMINE HCL-SODIUM CHLORIDE 100-0.9 MG/10ML-% IV SOSY
PREFILLED_SYRINGE | INTRAVENOUS | Status: AC
  Filled 2017-07-12: qty 10

## 2017-07-12 MED ORDER — FENTANYL CITRATE (PF) 100 MCG/2ML IJ SOLN
INTRAMUSCULAR | Status: DC | PRN
  Administered 2017-07-12 (×4): 50 ug via INTRAVENOUS
  Administered 2017-07-12: 100 ug via INTRAVENOUS
  Administered 2017-07-12: 50 ug via INTRAVENOUS

## 2017-07-12 SURGICAL SUPPLY — 89 items
BENZOIN TINCTURE PRP APPL 2/3 (GAUZE/BANDAGES/DRESSINGS) ×3 IMPLANT
BLADE CLIPPER SURG (BLADE) IMPLANT
BONE VIVIGEN FORMABLE 5.4CC (Bone Implant) ×3 IMPLANT
BUR PRESCISION 1.7 ELITE (BURR) ×3 IMPLANT
BUR ROUND FLUTED 5 RND (BURR) ×2 IMPLANT
BUR ROUND FLUTED 5MM RND (BURR) ×1
BUR ROUND PRECISION 4.0 (BURR) IMPLANT
BUR ROUND PRECISION 4.0MM (BURR)
BUR SABER RD CUTTING 3.0 (BURR) IMPLANT
BUR SABER RD CUTTING 3.0MM (BURR)
CARTRIDGE OIL MAESTRO DRILL (MISCELLANEOUS) ×1 IMPLANT
CLOSURE WOUND 1/2 X4 (GAUZE/BANDAGES/DRESSINGS) ×1
CONNECTOR EXPEDIUM TI 55MM (Connector) ×6 IMPLANT
CONT SPEC 4OZ CLIKSEAL STRL BL (MISCELLANEOUS) ×3 IMPLANT
COVER SURGICAL LIGHT HANDLE (MISCELLANEOUS) ×3 IMPLANT
DIFFUSER DRILL AIR PNEUMATIC (MISCELLANEOUS) ×6 IMPLANT
DRAIN CHANNEL 15F RND FF W/TCR (WOUND CARE) ×6 IMPLANT
DRAPE C-ARM 42X72 X-RAY (DRAPES) ×3 IMPLANT
DRAPE POUCH INSTRU U-SHP 10X18 (DRAPES) ×3 IMPLANT
DRAPE SURG 17X23 STRL (DRAPES) ×12 IMPLANT
DRSG MEPILEX BORDER 4X12 (GAUZE/BANDAGES/DRESSINGS) IMPLANT
DRSG MEPILEX BORDER 4X8 (GAUZE/BANDAGES/DRESSINGS) IMPLANT
DURAPREP 26ML APPLICATOR (WOUND CARE) ×3 IMPLANT
ELECT BLADE 4.0 EZ CLEAN MEGAD (MISCELLANEOUS) ×3
ELECT CAUTERY BLADE 6.4 (BLADE) ×6 IMPLANT
ELECT REM PT RETURN 9FT ADLT (ELECTROSURGICAL) ×3
ELECTRODE BLDE 4.0 EZ CLN MEGD (MISCELLANEOUS) ×1 IMPLANT
ELECTRODE REM PT RTRN 9FT ADLT (ELECTROSURGICAL) ×1 IMPLANT
EVACUATOR SILICONE 100CC (DRAIN) ×6 IMPLANT
FEE INTRAOP MONITOR IMPULS NCS (MISCELLANEOUS) ×1 IMPLANT
FLOSEAL 10ML (HEMOSTASIS) ×3 IMPLANT
GAUZE SPONGE 4X4 12PLY STRL (GAUZE/BANDAGES/DRESSINGS) ×3 IMPLANT
GAUZE SPONGE 4X4 16PLY XRAY LF (GAUZE/BANDAGES/DRESSINGS) ×6 IMPLANT
GLOVE BIO SURGEON STRL SZ7 (GLOVE) ×3 IMPLANT
GLOVE BIO SURGEON STRL SZ8 (GLOVE) ×3 IMPLANT
GLOVE BIOGEL PI IND STRL 7.0 (GLOVE) ×1 IMPLANT
GLOVE BIOGEL PI IND STRL 8 (GLOVE) ×1 IMPLANT
GLOVE BIOGEL PI INDICATOR 7.0 (GLOVE) ×2
GLOVE BIOGEL PI INDICATOR 8 (GLOVE) ×2
GOWN STRL REUS W/ TWL LRG LVL3 (GOWN DISPOSABLE) ×2 IMPLANT
GOWN STRL REUS W/ TWL XL LVL3 (GOWN DISPOSABLE) ×1 IMPLANT
GOWN STRL REUS W/TWL LRG LVL3 (GOWN DISPOSABLE) ×4
GOWN STRL REUS W/TWL XL LVL3 (GOWN DISPOSABLE) ×2
INTRAOP MONITOR FEE IMPULS NCS (MISCELLANEOUS) ×1
INTRAOP MONITOR FEE IMPULSE (MISCELLANEOUS) ×2
IV CATH 14GX2 1/4 (CATHETERS) ×3 IMPLANT
KIT BASIN OR (CUSTOM PROCEDURE TRAY) ×3 IMPLANT
KIT POSITION SURG JACKSON T1 (MISCELLANEOUS) ×3 IMPLANT
KIT ROOM TURNOVER OR (KITS) ×3 IMPLANT
MARKER SKIN DUAL TIP RULER LAB (MISCELLANEOUS) ×3 IMPLANT
NEEDLE HYPO 25GX1X1/2 BEV (NEEDLE) ×6 IMPLANT
NEEDLE SPNL 18GX3.5 QUINCKE PK (NEEDLE) ×6 IMPLANT
NS IRRIG 1000ML POUR BTL (IV SOLUTION) ×12 IMPLANT
OIL CARTRIDGE MAESTRO DRILL (MISCELLANEOUS) ×3
PACK LAMINECTOMY ORTHO (CUSTOM PROCEDURE TRAY) ×3 IMPLANT
PACK UNIVERSAL I (CUSTOM PROCEDURE TRAY) ×3 IMPLANT
PAD ARMBOARD 7.5X6 YLW CONV (MISCELLANEOUS) ×6 IMPLANT
PATTIES SURGICAL .5 X1 (DISPOSABLE) ×3 IMPLANT
PATTIES SURGICAL .5 X3 (DISPOSABLE) IMPLANT
PATTIES SURGICAL .5X1.5 (GAUZE/BANDAGES/DRESSINGS) ×3 IMPLANT
PATTIES SURGICAL .75X.75 (GAUZE/BANDAGES/DRESSINGS) ×3 IMPLANT
PROBE PEDCLE PROBE MAGSTM DISP (MISCELLANEOUS) ×3 IMPLANT
ROD EXPEDIUM PER BENT 65MM (Rod) ×3 IMPLANT
ROD EXPEDIUM PREBENT 85MM (Rod) ×3 IMPLANT
SCREW SET SINGLE INNER (Screw) ×9 IMPLANT
SCREW VIPER CORT FIX 5.00X35 (Screw) ×3 IMPLANT
SCREW VIPER CORT FIX 5.00X40 (Screw) ×3 IMPLANT
SCREW VIPER CORTICAL FIX 6X40 (Screw) ×3 IMPLANT
SPONGE INTESTINAL PEANUT (DISPOSABLE) ×3 IMPLANT
SPONGE SURGIFOAM ABS GEL 100 (HEMOSTASIS) ×3 IMPLANT
STRIP CLOSURE SKIN 1/2X4 (GAUZE/BANDAGES/DRESSINGS) ×2 IMPLANT
SURGIFLO W/THROMBIN 8M KIT (HEMOSTASIS) IMPLANT
SUT ETHILON 2 0 FS 18 (SUTURE) ×3 IMPLANT
SUT MNCRL AB 4-0 PS2 18 (SUTURE) ×3 IMPLANT
SUT VIC AB 0 CT1 18XCR BRD 8 (SUTURE) ×1 IMPLANT
SUT VIC AB 0 CT1 8-18 (SUTURE) ×2
SUT VIC AB 1 CT1 18XCR BRD 8 (SUTURE) ×2 IMPLANT
SUT VIC AB 1 CT1 8-18 (SUTURE) ×4
SUT VIC AB 2-0 CT2 18 VCP726D (SUTURE) ×9 IMPLANT
SYR 20CC LL (SYRINGE) ×3 IMPLANT
SYR BULB IRRIGATION 50ML (SYRINGE) ×3 IMPLANT
SYR CONTROL 10ML LL (SYRINGE) ×9 IMPLANT
SYR TB 1ML LUER SLIP (SYRINGE) ×3 IMPLANT
TAPE CLOTH SURG 6X10 WHT LF (GAUZE/BANDAGES/DRESSINGS) ×3 IMPLANT
TOWEL OR 17X24 6PK STRL BLUE (TOWEL DISPOSABLE) ×3 IMPLANT
TOWEL OR 17X26 10 PK STRL BLUE (TOWEL DISPOSABLE) ×3 IMPLANT
TRAY FOLEY W/METER SILVER 16FR (SET/KITS/TRAYS/PACK) IMPLANT
WATER STERILE IRR 1000ML POUR (IV SOLUTION) IMPLANT
YANKAUER SUCT BULB TIP NO VENT (SUCTIONS) ×3 IMPLANT

## 2017-07-12 NOTE — Transfer of Care (Signed)
Immediate Anesthesia Transfer of Care Note  Patient: [name]  Procedure(s) Performed: RIGHT SIDED LUIMBAR 1-2/2-3 DECOMPRESSION AND FUSION AND POSTERIOR INSTRUMENTATION (Right Spine Lumbar)  Patient Location: PACU  Anesthesia Type:General  Level of Consciousness: drowsy and patient cooperative  Airway & Oxygen Therapy: Patient Spontanous Breathing and Patient connected to nasal cannula oxygen  Post-op Assessment: Report given to RN and Post -op Vital signs reviewed and stable  Post vital signs: Reviewed and stable  Last Vitals:  Vitals:   07/12/17 0919 07/12/17 1810  BP: 124/86 102/62  Pulse: 81 87  Resp: 20 16  Temp: 36.7 C 36.5 C  SpO2: 98% 94%    Last Pain:  Vitals:   07/12/17 0919  TempSrc: Oral  PainSc:       Patients Stated Pain Goal: 3 (63/84/53 [id number])  Complications: No apparent anesthesia complications

## 2017-07-12 NOTE — Progress Notes (Signed)
Per Dr. Valma Cava, it is ok to give another dose of morphine for pain.

## 2017-07-12 NOTE — Care Management Note (Signed)
Case Management Note  Patient Details  Name: [name] MRN: [id number] Date of Birth: [birth year]  Subjective/Objective:    Pt admitted with abscess to lower neck. She also underwent L1-3 PLIF yesterday.               Action/Plan: Pt for 2nd part of surgery today. Awaiting PT/OT evals post surgeries. CM following for d/c needs, physician orders.  Expected Discharge Date:                  Expected Discharge Plan:     In-House Referral:     Discharge planning Services     Post Acute Care Choice:    Choice offered to:     DME Arranged:    DME Agency:     HH Arranged:    HH Agency:     Status of Service:  In process, will continue to follow  If discussed at Long Length of Stay Meetings, dates discussed:    Additional Comments:  Pollie Friar, RN 07/12/2017, 11:51 AM

## 2017-07-12 NOTE — Anesthesia Procedure Notes (Signed)
Procedure Name: Intubation Date/Time: 07/12/2017 1:18 PM Performed by: Shirlyn Goltz, CRNA Pre-anesthesia Checklist: Patient identified, Emergency Drugs available, Suction available and Patient being monitored Patient Re-evaluated:Patient Re-evaluated prior to induction Oxygen Delivery Method: Circle system utilized Preoxygenation: Pre-oxygenation with 100% oxygen Induction Type: IV induction Ventilation: Mask ventilation without difficulty Laryngoscope Size: Mac and 3 Grade View: Grade I Tube type: Oral Tube size: 7.0 mm Number of attempts: 1 Airway Equipment and Method: Stylet Placement Confirmation: ETT inserted through vocal cords under direct vision,  positive ETCO2 and breath sounds checked- equal and bilateral Secured at: 21 cm Tube secured with: Tape Dental Injury: Teeth and Oropharynx as per pre-operative assessment

## 2017-07-12 NOTE — Progress Notes (Signed)
Orthopedic Tech Progress Note Patient Details:  [name] May 02, 1961 [id number]  Patient ID: [name], female   DOB: 02/22/1961, 57 y.o.   MRN: [id number]   [name] 07/12/2017, 9:14 AM Called in bio-tech brace order; spoke with [name]

## 2017-07-12 NOTE — Progress Notes (Signed)
Patient sat on the side of the bed and dangled her feet twice. No brace yet, coming from outside vendor. Therefore patient could not ambulate this am.

## 2017-07-12 NOTE — H&P (Signed)
Patient presents today for stage 2 of her 2 staged procedure.  She tolerated stage I well yesterday, specifically, a left-sided lateral interbody fusion at L1-L2 and L2-L3.  We will proceed with stage II of her procedure today, her posterior decompression and fusion spanning L1-L3.

## 2017-07-12 NOTE — Progress Notes (Signed)
PROGRESS NOTE    [name]   OJJ:[id number]  DOB: [birth year]  DOA: 07/10/2017 PCP: [name], MD   Brief Narrative:  [name] is a 57 y.o.femalewith a past medical history of hypertension, breast cancer status post radiation therapy completed 2 years ago, who presents to ED for evaluation of ongoing lower back pain for the past several months. She states that her orthopedic provider sent her to the ED for pain control, admission and surgery for radiculopathyL1-L2 (causing right thigh pain). She states that despite the use of hydrocodone on at home, she continues to have pain. She also has a history of lumbar fusion surgery several years ago. The patient also has an abscess to the back of her neckwhich is actively draining purulent material. She had the area drained approximately 1 week ago and reports compliance with her home Bactrim.  She was found to be hypotensive in the ER- BP 89/62, Temp 99.1.   Subjective: Having pain in left flank and buttock today.  ROS: no complaints of nausea, vomiting, constipation diarrhea, cough, dyspnea or dysuria. No other complaints.   Assessment & Plan:   Principal Problem:   Hypotension with h/o HTN - has resolved with IVF- ? If it was due to pain medications taken at home in addition to antihypertensives which are currently on hold  Active Problems:   Spinal stenosis, lumbar region L1-L2 with neurogenic claudication - underwent interbody fusion of L1-2 and insertion of "interbody device x 2" on 3/6 by Dr Lynann Bologna -going back to OR today    Posterior Neck abscess - initially lanced at urgent care on 2/27 and apparently grew MRSA - placed on Bactrim after lancing but continues to drain - culture sent on 3/6- gr stain shows rare WBC (mostly PMN) and Gr + cocci - MRSA PCR is + - currently on Vancomycin- general surgery following - they do not feel further I and D is needed- cont wound care/ packing per gen  surgery  Hyponatremia - sodium of 132 on admission possibly due to HCTZ/ dehydration - has improved after IVF- HCTZ on hold for now    Breast cancer of upper-outer quadrant of left female breast ([place])    DVT prophylaxis: SCDs Code Status: Full code Family Communication:  Disposition Plan: follow in hospital - plan per ortho Consultants:   Ortho  gen surgery Antimicrobials:  Anti-infectives (From admission, onward)   Start     Dose/Rate Route Frequency Ordered Stop   07/11/17 0500  vancomycin (VANCOCIN) IVPB 750 mg/150 ml premix     750 mg 150 mL/hr over 60 Minutes Intravenous Every 12 hours 07/10/17 1622     07/10/17 1700  vancomycin (VANCOCIN) 2,000 mg in sodium chloride 0.9 % 500 mL IVPB     2,000 mg 250 mL/hr over 120 Minutes Intravenous  Once 07/10/17 1617 07/10/17 2026       Objective: Vitals:   07/12/17 0006 07/12/17 0500 07/12/17 0503 07/12/17 0919  BP: 112/71  (!) 127/93 124/86  Pulse: 90  75 81  Resp: 18  15 20   Temp: 98.2 F (36.8 C)  97.7 F (36.5 C) 98.1 F (36.7 C)  TempSrc: Oral  Oral Oral  SpO2: 97%  96% 98%  Weight:  107.7 kg (237 lb 7 oz)    Height:        Intake/Output Summary (Last 24 hours) at 07/12/2017 1013 Last data filed at 07/12/2017 0500 Gross per 24 hour  Intake 2054.84 ml  Output 2450 ml  Net -395.16 ml   Filed Weights   07/10/17 1042 07/11/17 0500 07/12/17 0500  Weight: 104.3 kg (230 lb) 106.8 kg (235 lb 7.2 oz) 107.7 kg (237 lb 7 oz)    Examination: General exam: Appears comfortable  HEENT: PERRLA, oral mucosa moist, no sclera icterus or thrush Respiratory system: Clear to auscultation. Respiratory effort normal. Cardiovascular system: S1 & S2 heard, RRR.  No murmurs  Gastrointestinal system: Abdomen soft, non-tender, nondistended. Normal bowel sound. No organomegaly Central nervous system: Alert and oriented. No focal neurological deficits. Extremities: No cyanosis, clubbing or edema Psychiatry:  Mood & affect appropriate.      Data Reviewed: I have personally reviewed following labs and imaging studies  CBC: Recent Labs  Lab 07/10/17 1120 07/12/17 0319  WBC 9.5 6.9  HGB 14.3 12.4  HCT 42.0 38.8  MCV 91.1 93.0  PLT 242 409   Basic Metabolic Panel: Recent Labs  Lab 07/10/17 1120 07/12/17 0319  NA 132* 137  K 4.0 4.5  CL 96* 103  CO2 25 26  GLUCOSE 130* 132*  BUN 10 8  CREATININE 1.08* 0.97  CALCIUM 9.1 8.3*   GFR: Estimated Creatinine Clearance: 82.8 mL/min (by C-G formula based on SCr of 0.97 mg/dL). Liver Function Tests: Recent Labs  Lab 07/10/17 1120  AST 31  ALT 43  ALKPHOS 105  BILITOT 0.3  PROT 6.0*  ALBUMIN 3.3*   No results for input(s): LIPASE, AMYLASE in the last 168 hours. No results for input(s): AMMONIA in the last 168 hours. Coagulation Profile: No results for input(s): INR, PROTIME in the last 168 hours. Cardiac Enzymes: No results for input(s): CKTOTAL, CKMB, CKMBINDEX, TROPONINI in the last 168 hours. BNP (last 3 results) No results for input(s): PROBNP in the last 8760 hours. HbA1C: No results for input(s): HGBA1C in the last 72 hours. CBG: Recent Labs  Lab 07/11/17 0640 07/12/17 0633  GLUCAP 142* 128*   Lipid Profile: No results for input(s): CHOL, HDL, LDLCALC, TRIG, CHOLHDL, LDLDIRECT in the last 72 hours. Thyroid Function Tests: No results for input(s): TSH, T4TOTAL, FREET4, T3FREE, THYROIDAB in the last 72 hours. Anemia Panel: No results for input(s): VITAMINB12, FOLATE, FERRITIN, TIBC, IRON, RETICCTPCT in the last 72 hours. Urine analysis:    Component Value Date/Time   COLORURINE YELLOW 02/20/2016 2221   APPEARANCEUR CLOUDY (A) 02/20/2016 2221   LABSPEC 1.018 02/20/2016 2221   PHURINE 5.5 02/20/2016 2221   GLUCOSEU NEGATIVE 02/20/2016 2221   HGBUR NEGATIVE 02/20/2016 2221   BILIRUBINUR NEGATIVE 02/20/2016 2221   KETONESUR NEGATIVE 02/20/2016 2221   PROTEINUR NEGATIVE 02/20/2016 2221   UROBILINOGEN 0.2 04/18/2013 0950   NITRITE  NEGATIVE 02/20/2016 2221   LEUKOCYTESUR NEGATIVE 02/20/2016 2221   Sepsis Labs: @LABRCNTIP (procalcitonin:4,lacticidven:4) ) Recent Results (from the past 240 hour(s))  Aerobic Culture (superficial specimen)     Status: None (Preliminary result)   Collection Time: 07/11/17 10:05 AM  Result Value Ref Range Status   Specimen Description NECK  Final   Special Requests Normal  Final   Gram Stain   Final    RARE WBC PRESENT, PREDOMINANTLY PMN RARE GRAM POSITIVE COCCI Performed at [facility], [place] [address], [place], [place] [address]    Culture PENDING  Incomplete   Report Status PENDING  Incomplete  MRSA PCR Screening     Status: Abnormal   Collection Time: 07/11/17 12:12 PM  Result Value Ref Range Status   MRSA by PCR POSITIVE (A) NEGATIVE Final    Comment:  The GeneXpert MRSA Assay (FDA approved for NASAL specimens only), is one component of a comprehensive MRSA colonization surveillance program. It is not intended to diagnose MRSA infection nor to guide or monitor treatment for MRSA infections. RESULT CALLED TO, READ BACK BY AND VERIFIED WITH: Thompson Caul RN 14:50 07/11/17 ([email or web address])          Radiology Studies: Dg Lumbar Spine 2-3 Views  Result Date: 07/11/2017 CLINICAL DATA:  L1-2, L2-3 fusion EXAM: DG C-ARM 61-120 MIN; LUMBAR SPINE - 2-3 VIEW COMPARISON:  06/17/2017 FLUOROSCOPY TIME:  Fluoroscopy Time:  3 minutes 30 seconds Radiation Exposure Index (if provided by the fluoroscopic device): Not available Number of Acquired Spot Images: 2 FINDINGS: There again noted pedicle screws at L3 and L4 with interbody fusion. Subsequent interbody fusion at L2-3 and L1-2 is noted. Left-sided lateral fixation is noted with fixation screws extending into the vertebral body at L1 and L2. IMPRESSION: L1-2 and L2-3 fusion. Electronically Signed   By: Inez Catalina M.D.   On: 07/11/2017 17:01   Ct Soft Tissue Neck W Contrast  Result Date: 07/10/2017 CLINICAL DATA:  57 y/o  F;  hypotension.  Cellulitis of neck. EXAM: CT NECK WITH CONTRAST TECHNIQUE: Multidetector CT imaging of the neck was performed using the standard protocol following the bolus administration of intravenous contrast. CONTRAST:  100 cc Isovue 370 COMPARISON:  None. FINDINGS: Pharynx and larynx: Normal. No mass or swelling. Salivary glands: No inflammation, mass, or stone. Thyroid: Nodule in right lobe of thyroid measuring 22 mm. Lymph nodes: None enlarged or abnormal density. Vascular: Mixed plaque of left carotid bifurcation with mild 50% proximal ICA stenosis. Several segments of fibrofatty plaque within common carotid arteries without high-grade stenosis. Limited intracranial: Negative. Visualized orbits: Negative. Mastoids and visualized paranasal sinuses: Mild ethmoid and maxillary sinus mucosal thickening. Normal aeration of mastoid air cells. Skeleton: Mild cervical spondylosis with multilevel disc and facet degenerative changes. Uncovertebral and facet hypertrophy results in multiple left-sided narrowing of bony neural foramen. Upper chest: Negative. Other: Multiloculated abscess centered within the posterior lower neck subcutaneous fat measuring 1.5 x 4.1 x 3.0 cm in conglomerate (AP x ML x CC series 1, image 39 and series 6, image 39). There is a sinus tract extending to the skin surface. Surrounding inflammation is present within subcutaneous fat. The abscess does not extend into the deep cervical soft tissues. IMPRESSION: Multiloculated abscess centered within posterior lower neck subcutaneous fat with sinus tract to skin surface measuring up to 4.1 cm. Electronically Signed   By: Kristine Garbe M.D.   On: 07/10/2017 18:27   Ct Lumbar Spine Wo Contrast  Result Date: 07/12/2017 CLINICAL DATA:  Initial evaluation for ongoing lower back pain, history of prior spinal fusion earlier the same day. EXAM: CT LUMBAR SPINE WITHOUT CONTRAST TECHNIQUE: Multidetector CT imaging of the lumbar spine was  performed without intravenous contrast administration. Multiplanar CT image reconstructions were also generated. COMPARISON:  Prior radiograph from earlier the same day as well as previous MRI from 07/02/2017. FINDINGS: Segmentation: Transitional lumbosacral anatomy. Same numbering system is employed as on previous exams. Alignment: Stable amount with mild levoscoliosis. Anterolisthesis at the previously fused L3-4 level is unchanged. Trace retrolisthesis of L2 on L3 also relatively similar. Vertebrae: Vertebral body heights maintained without evidence for acute or interval fracture. No discrete lytic or blastic osseous lesions. Reactive endplate changes present about the L2-3 and L4-5 interspaces. Patient status post PLIF at the L3-4 level. Hardware at this level appears well positioned and intact without  periprosthetic lucency to suggest loosening or failure. Postoperative changes from recent left lateral and interbody fusion present at L1-2. Hardware appears well aligned intact, although the inferior screw at L2 closely approximates the superior endplate of L2. Interbody grafts have been placed in within the L1-2 and L2-3 interspaces and appear well positioned Paraspinal and other soft tissues: Mild edema with scattered foci of postoperative emphysema present within the psoas musculature bilaterally, right greater than left. No discrete collections. Aortic atherosclerosis noted. Visualized visceral structures within normal limits. Disc levels: T11-12: Mild diffuse disc bulging with intervertebral disc space narrowing. Superimposed left paracentral disc protrusion with annular calcification (series 5, image 20). No significant canal or foraminal stenosis. T12-L1: Mild diffuse disc bulge. No canal or foraminal stenosis. L1-2: Interval performance of left lateral and interbody fixation. Previously identified right subarticular and foraminal disc extrusion with cephalad migration again seen (series 9, image 28, 23).  This is similar relative to recent MRI. Resultant moderate right lateral recess with severe right L1 foraminal narrowing relatively similar. Superimposed right greater than left facet hypertrophy. Overall mild central canal stenosis. Left neural foramen remains patent. L2-3: Interval performance of interbody fusion. Scattered disc desiccation with vacuum disc phenomenon noted. Right eccentric disc osteophyte complex with superimposed bilateral bulky facet hypertrophy again seen. There is resultant moderate spinal stenosis with severe right lateral recess narrowing and severe right L2 foraminal stenosis. More moderate left L1 foraminal narrowing. L3-4:  Prior PLIF.  No residual stenosis. L4-5: Left eccentric disc bulge with intervertebral disc space narrowing. Mild vacuum disc phenomenon. Moderate left greater than right facet and ligament flavum hypertrophy. Resultant mild to moderate left lateral recess stenosis with moderate left L4 foraminal narrowing. No significant right foraminal narrowing. Central canal remains patent. L5-S1: Transitional lumbosacral anatomy with partial sacralization of the L5 vertebral body. Partial ankylosis of the left-sided facets. Sclerotic left-sided assimilation joint. Epidural lipomatosis. No significant stenosis. IMPRESSION: 1. Postoperative changes from interval left lateral and interbody fusion at L1-2, with additional interbody fusion at L2-3. No complication. 2. Persistent moderate sized right subarticular and foraminal disc extrusion at L1-2 with resultant moderate right lateral recess with severe right L1 foraminal narrowing. 3. Right eccentric disc osteophyte with facet hypertrophy at L2-3 with resultant severe right lateral recess and right L2 foraminal stenosis. 4. Prior fusion at L3-4 without residual stenosis. 5. Left eccentric disc osteophyte with facet hypertrophy at L4-5 with resultant mild to moderate left lateral recess and left L4 foraminal narrowing. 6.  Transitional lumbosacral anatomy. Electronically Signed   By: Jeannine Boga M.D.   On: 07/12/2017 00:26   Dg C-arm 1-60 Min  Result Date: 07/11/2017 CLINICAL DATA:  L1-2, L2-3 fusion EXAM: DG C-ARM 61-120 MIN; LUMBAR SPINE - 2-3 VIEW COMPARISON:  06/17/2017 FLUOROSCOPY TIME:  Fluoroscopy Time:  3 minutes 30 seconds Radiation Exposure Index (if provided by the fluoroscopic device): Not available Number of Acquired Spot Images: 2 FINDINGS: There again noted pedicle screws at L3 and L4 with interbody fusion. Subsequent interbody fusion at L2-3 and L1-2 is noted. Left-sided lateral fixation is noted with fixation screws extending into the vertebral body at L1 and L2. IMPRESSION: L1-2 and L2-3 fusion. Electronically Signed   By: Inez Catalina M.D.   On: 07/11/2017 17:01   Dg C-arm 1-60 Min  Result Date: 07/11/2017 CLINICAL DATA:  L1-2, L2-3 fusion EXAM: DG C-ARM 61-120 MIN; LUMBAR SPINE - 2-3 VIEW COMPARISON:  06/17/2017 FLUOROSCOPY TIME:  Fluoroscopy Time:  3 minutes 30 seconds Radiation Exposure Index (if provided  by the fluoroscopic device): Not available Number of Acquired Spot Images: 2 FINDINGS: There again noted pedicle screws at L3 and L4 with interbody fusion. Subsequent interbody fusion at L2-3 and L1-2 is noted. Left-sided lateral fixation is noted with fixation screws extending into the vertebral body at L1 and L2. IMPRESSION: L1-2 and L2-3 fusion. Electronically Signed   By: Inez Catalina M.D.   On: 07/11/2017 17:01   Dg C-arm 1-60 Min  Result Date: 07/11/2017 CLINICAL DATA:  L1-2, L2-3 fusion EXAM: DG C-ARM 61-120 MIN; LUMBAR SPINE - 2-3 VIEW COMPARISON:  06/17/2017 FLUOROSCOPY TIME:  Fluoroscopy Time:  3 minutes 30 seconds Radiation Exposure Index (if provided by the fluoroscopic device): Not available Number of Acquired Spot Images: 2 FINDINGS: There again noted pedicle screws at L3 and L4 with interbody fusion. Subsequent interbody fusion at L2-3 and L1-2 is noted. Left-sided lateral  fixation is noted with fixation screws extending into the vertebral body at L1 and L2. IMPRESSION: L1-2 and L2-3 fusion. Electronically Signed   By: Inez Catalina M.D.   On: 07/11/2017 17:01   Ct Angio Chest/abd/pel For Dissection W And/or Wo Contrast  Result Date: 07/10/2017 CLINICAL DATA:  Hypotension.  Neck cellulitis. EXAM: CT ANGIOGRAPHY CHEST, ABDOMEN AND PELVIS TECHNIQUE: Multidetector CT imaging through the chest, abdomen and pelvis was performed using the standard protocol during bolus administration of intravenous contrast. Multiplanar reconstructed images and MIPs were obtained and reviewed to evaluate the vascular anatomy. CONTRAST:  100 mL Isovue 370 COMPARISON:  CTs of the lumbar spine and right hip 06/17/2017 FINDINGS: CTA CHEST FINDINGS Cardiovascular: There is no evidence of thoracic aortic intramural hematoma on precontrast images. The thoracic aorta is normal in caliber without aneurysm or dissection. The heart is normal in size. There is lipomatous hypertrophy of the interatrial septum. There is no pericardial effusion. No central pulmonary emboli are identified on this nondedicated study. Mediastinum/Nodes: Heterogeneity of the right greater than left thyroid lobes with assessment limited by streak artifact from venous contrast. No enlarged axillary or mediastinal lymph nodes. Subcentimeter hilar lymph nodes bilaterally, slightly more prominent on the right. Small sliding hiatal hernia. Lungs/Pleura: No pleural effusion or pneumothorax. Evaluation of the lung parenchyma is mildly limited by respiratory motion artifact. There is mild bronchial wall thickening bilaterally and minimal dependent atelectasis bilaterally. No mass. Musculoskeletal: No acute osseous abnormality or suspicious osseous lesion. Review of the MIP images confirms the above findings. CTA ABDOMEN AND PELVIS FINDINGS VASCULAR Aorta: Patent with mild diffuse atherosclerotic irregularity. No aneurysm or dissection. Celiac:  Patent without evidence of aneurysm, dissection, vasculitis or significant stenosis. Accessory right hepatic artery arises from the left gastric artery, a normal variant. SMA: Patent with less than 50% narrowing proximally.  No dissection. Renals: Single renal arteries bilaterally are patent without evidence of dissection. 65% stenosis of the proximal right main renal artery. IMA: Patent without evidence of aneurysm, dissection, vasculitis or significant stenosis. Inflow: Patent with mild atherosclerosis bilaterally. No significant stenosis or dissection. Veins: No obvious venous abnormality within the limitations of this arterial phase study. Review of the MIP images confirms the above findings. NON-VASCULAR Hepatobiliary: No focal liver abnormality is seen. No gallstones, gallbladder wall thickening, or biliary dilatation. Pancreas: Unremarkable. Spleen: Unremarkable. Adrenals/Urinary Tract: Unremarkable adrenal glands. No hydronephrosis. Subcentimeter right renal hypodensity, too small to fully characterize. Unremarkable bladder. Stomach/Bowel: No bowel obstruction or gross wall thickening. Appendix not clearly visualized, however no inflammatory changes are seen to suggest appendicitis. Lymphatic: No enlarged lymph nodes. Reproductive: Uterus and bilateral  adnexa are unremarkable. Other: No intraperitoneal free fluid.  No abdominal wall hernia. Musculoskeletal: Prior lumbar fusion with advanced disc degeneration above and below the fused segment. Mild-to-moderate lumbar levoscoliosis. Review of the MIP images confirms the above findings. IMPRESSION: 1. No aortic aneurysm or dissection. Aortic Atherosclerosis (ICD10-I70.0). 2. 65% right renal artery stenosis. 3. Bronchial wall thickening. 4. Small hiatal hernia. Electronically Signed   By: Logan Bores M.D.   On: 07/10/2017 18:43      Scheduled Meds: . acidophilus  1 capsule Oral Daily  . amitriptyline  25 mg Oral QHS  . atorvastatin  20 mg Oral Daily  .  budesonide  9 mg Oral QHS  . cholecalciferol  1,000 Units Oral Daily  . docusate sodium  100 mg Oral BID  . letrozole  2.5 mg Oral Daily  . multivitamin with minerals  1 tablet Oral Daily  . pantoprazole (PROTONIX) IV  40 mg Intravenous QHS  . sodium chloride flush  3 mL Intravenous Q12H  . sodium chloride flush  3 mL Intravenous Q12H   Continuous Infusions: . sodium chloride    . 0.9 % NaCl with KCl 20 mEq / L 10 mL/hr at 07/11/17 2120  . lactated ringers 10 mL/hr at 07/11/17 1251  . vancomycin 750 mg (07/12/17 0452)     LOS: 1 day    Time spent in minutes: [place], MD [facility] Pager: [email or web address] Password [email or web address] 07/12/2017, 10:13 AM

## 2017-07-12 NOTE — Anesthesia Postprocedure Evaluation (Signed)
Anesthesia Post Note  Patient: [name]  Procedure(s) Performed: RIGHT SIDED LUIMBAR 1-2/2-3 DECOMPRESSION AND FUSION AND POSTERIOR INSTRUMENTATION (Right Spine Lumbar)     Patient location during evaluation: PACU Anesthesia Type: General Level of consciousness: awake and alert Pain management: pain level controlled Vital Signs Assessment: post-procedure vital signs reviewed and stable Respiratory status: spontaneous breathing, nonlabored ventilation, respiratory function stable and patient connected to nasal cannula oxygen Cardiovascular status: blood pressure returned to baseline and stable Postop Assessment: no apparent nausea or vomiting Anesthetic complications: no    Last Vitals:  Vitals:   07/12/17 1825 07/12/17 1840  BP: (!) 94/48 95/61  Pulse: 85 78  Resp: 17   Temp:    SpO2: 99% 97%    Last Pain:  Vitals:   07/12/17 1848  TempSrc:   PainSc: 6     LLE Motor Response: Purposeful movement;Responds to commands (07/12/17 1840) LLE Sensation: Full sensation (07/12/17 1840) RLE Motor Response: Purposeful movement;Responds to commands (07/12/17 1840) RLE Sensation: Full sensation (07/12/17 1840)      Jamorian Dimaria COKER

## 2017-07-12 NOTE — Anesthesia Preprocedure Evaluation (Signed)
Anesthesia Evaluation  Patient identified by MRN, date of birth, ID band Patient awake    Reviewed: Allergy & Precautions, NPO status , Patient's Chart, lab work & pertinent test results  History of Anesthesia Complications (+) PONV  Airway Mallampati: III  TM Distance: >3 FB Neck ROM: Full    Dental  (+) Edentulous Upper, Edentulous Lower   Pulmonary Current Smoker,    breath sounds clear to auscultation       Cardiovascular hypertension, Pt. on medications + Peripheral Vascular Disease   Rhythm:Regular Rate:Normal  EKG - SR  '13 Carotid US - 40-60% right ICAS stenosis, no significant left ICAS  '13 TTE - Normal EF, grade 1 diastolic dysfunction, mild LVH   Neuro/Psych  Headaches, PSYCHIATRIC DISORDERS Anxiety Depression TIA   GI/Hepatic negative GI ROS, Neg liver ROS,   Endo/Other  obesity  Renal/GU Renal InsufficiencyRenal disease  negative genitourinary   Musculoskeletal  (+) Arthritis ,   Abdominal (+) + obese,   Peds negative pediatric ROS (+)  Hematology negative hematology ROS (+)   Anesthesia Other Findings Breast cancer s/p radiation  Reproductive/Obstetrics                             Lab Results  Component Value Date   WBC 6.9 07/12/2017   HGB 12.4 07/12/2017   HCT 38.8 07/12/2017   MCV 93.0 07/12/2017   PLT 170 07/12/2017   Lab Results  Component Value Date   CREATININE 0.97 07/12/2017   BUN 8 07/12/2017   NA 137 07/12/2017   K 4.5 07/12/2017   CL 103 07/12/2017   CO2 26 07/12/2017     Anesthesia Physical  Anesthesia Plan  ASA: II  Anesthesia Plan: General   Post-op Pain Management:    Induction: Intravenous  PONV Risk Score and Plan: 4 or greater and Treatment may vary due to age or medical condition, Ondansetron, Midazolam, Scopolamine patch - Pre-op and Propofol infusion  Airway Management Planned: Oral ETT  Additional Equipment:  None  Intra-op Plan:   Post-operative Plan: Extubation in OR  Informed Consent: I have reviewed the patients History and Physical, chart, labs and discussed the procedure including the risks, benefits and alternatives for the proposed anesthesia with the patient or authorized representative who has indicated his/her understanding and acceptance.   Dental advisory given  Plan Discussed with: CRNA  Anesthesia Plan Comments:         Anesthesia Quick Evaluation

## 2017-07-12 NOTE — Progress Notes (Signed)
1 Day Post-Op    CC:  Neck abscess  Subjective: She is really uncomfortable today but it is her back not her neck.  Neck is not bothering her a great deal  Cellulitis is still present but stable and improving.  Minimal drainage on the dressing, she isn't sure when she had it changed.  They were never able to get her into the shower.  she had back surgery listed below yesterday.  Objective: Vital signs in last 24 hours: Temp:  [97.7 F (36.5 C)-98.4 F (36.9 C)] 98.1 F (36.7 C) (03/07 0919) Pulse Rate:  [75-94] 81 (03/07 0919) Resp:  [15-27] 20 (03/07 0919) BP: (101-134)/(57-93) 124/86 (03/07 0919) SpO2:  [86 %-100 %] 98 % (03/07 0919) Weight:  [107.7 kg (237 lb 7 oz)] 107.7 kg (237 lb 7 oz) (03/07 0500) Last BM Date: 07/10/17  Intake/Output from previous day: 03/06 0701 - 03/07 0700 In: 2219.8 [I.V.:2219.8] Out: 2450 [Urine:2250; Blood:200] Intake/Output this shift: No intake/output data recorded.  General appearance: alert, cooperative and having back pain more than neck discomfort Neck: open site with iodoform in place, some drainage, but not much  Cellulitis is slowly improving.  Lab Results:  Recent Labs    07/10/17 1120 07/12/17 0319  WBC 9.5 6.9  HGB 14.3 12.4  HCT 42.0 38.8  PLT 242 170    BMET Recent Labs    07/10/17 1120 07/12/17 0319  NA 132* 137  K 4.0 4.5  CL 96* 103  CO2 25 26  GLUCOSE 130* 132*  BUN 10 8  CREATININE 1.08* 0.97  CALCIUM 9.1 8.3*   PT/INR No results for input(s): LABPROT, INR in the last 72 hours.  Recent Labs  Lab 07/10/17 1120  AST 31  ALT 43  ALKPHOS 105  BILITOT 0.3  PROT 6.0*  ALBUMIN 3.3*     Lipase  No results found for: LIPASE   Medications: . acidophilus  1 capsule Oral Daily  . amitriptyline  25 mg Oral QHS  . atorvastatin  20 mg Oral Daily  . budesonide  9 mg Oral Daily  . cholecalciferol  1,000 Units Oral Daily  . docusate sodium  100 mg Oral BID  . letrozole  2.5 mg Oral Daily  . multivitamin  with minerals  1 tablet Oral Daily  . pantoprazole (PROTONIX) IV  40 mg Intravenous QHS  . sodium chloride flush  3 mL Intravenous Q12H  . sodium chloride flush  3 mL Intravenous Q12H    Assessment/Plan Spinal stenosis with lumbar region pain Direct lateral interbody fusion, via a left-sided approach, L1-2,  L2-3.  Placement of anterior instrumentation, L1-2.   Insertion of interbody device x2 (NuVasive intervertebral spacersx2). 07/11/16, Dr. Phylliss Bob Hypertension Left Breast Cancer with lumpectomy - radiation Rx Hx of depression Prior lumbar fusion Tobacco use  Neck Abscess with I&D 07/04/17/MRSA  FEN:  IV fluids/regular diet ID:  Septra 2/24 >>   Vancomycin 3/5 =>> day 2 DVT:  SCD's Foley:  None Follow up:  PCP  Plan:  Continue local wound care, she says her back hurts to much to get OOB so she will not be getting into the shower.  I will have then clean site with soap and water.  Continue wicking of wound with Iodoform and dry dressing.  Continue abx.  She can follow up with her PCP for this after discharge.  We don't have much more we can add.         LOS: 1 day  Earnstine Regal 07/12/2017 [phone]

## 2017-07-13 DIAGNOSIS — K52832 Lymphocytic colitis: Secondary | ICD-10-CM

## 2017-07-13 LAB — AEROBIC CULTURE W GRAM STAIN (SUPERFICIAL SPECIMEN): Special Requests: NORMAL

## 2017-07-13 LAB — AEROBIC CULTURE  (SUPERFICIAL SPECIMEN)

## 2017-07-13 MED ORDER — DIAZEPAM 5 MG PO TABS: 5.0000 mg | ORAL_TABLET | Freq: Four times a day (QID) | ORAL | 0 refills | Status: DC | PRN

## 2017-07-13 MED ORDER — OXYCODONE-ACETAMINOPHEN 5-325 MG PO TABS: 1.0000 | ORAL_TABLET | ORAL | 0 refills | Status: DC | PRN

## 2017-07-13 MED ORDER — PANTOPRAZOLE SODIUM 40 MG PO TBEC
40.0000 mg | DELAYED_RELEASE_TABLET | Freq: Every day | ORAL | Status: DC
  Administered 2017-07-13: 40 mg via ORAL
  Filled 2017-07-13: qty 1

## 2017-07-13 MED ORDER — ONDANSETRON HCL 4 MG PO TABS: 4.0000 mg | ORAL_TABLET | Freq: Four times a day (QID) | ORAL | 0 refills | Status: DC | PRN

## 2017-07-13 MED FILL — Thrombin For Soln 20000 Unit: CUTANEOUS | Qty: 1 | Status: AC

## 2017-07-13 NOTE — Op Note (Signed)
[name], [name] NO.:  [id number]  MEDICAL RECORD NO.:  [id number]  PHYSICIAN:  Phylliss Bob, MD      DATE OF BIRTH:  [birth year]  DATE OF PROCEDURE:  07/12/2017                              OPERATIVE REPORT   PREOPERATIVE DIAGNOSES: 1. Severe right-sided lumbar radiculopathy. 2. Status post previous lateral interbody fusion, L1-L2, L2-L3,     requiring a posterior fusion and decompression with     instrumentation.  POSTOPERATIVE DIAGNOSES: 1. Severe right-sided lumbar radiculopathy. 2. Status post previous lateral interbody fusion, L1-L2, L2-L3,     requiring a posterior fusion and decompression with     instrumentation.  PROCEDURES (Stage 2): 1. Posterior spinal fusion, L1-L2, L2-L3. 2. Posterior decompression, L1-L2, L2-L3. 3. Posterior segmental instrumentation, L1, L2, which was hooked up to     the patient's previous L3-L4 instrumentation construct. 4. Use of local autograft. 5. Use of morselized allograft - ViviGen. 6. Intraoperative use of fluoroscopy.  SURGEON:  Phylliss Bob, MD.  ASSISTANTPricilla Holm, PA-C.  ANESTHESIA:  General endotracheal anesthesia.  COMPLICATIONS:  None.  DISPOSITION:  Stable.  ESTIMATED BLOOD LOSS:  200 mL.  INDICATIONS FOR SURGERY:  Briefly, [name] is a 57 year old female, whom I did evaluate with severe pain in the right leg.  The patient also had profound weakness.  Please refer to my operative report dated July 11, 2017, for a full account of the patient's indication for surgery.  The patient did have stage I of a two-staged procedure yesterday, on July 11, 2017, and did present today for stage II of her procedure, specifically, a decompression and fusion.  Of note, on the morning of today's surgery, she did continue to have ongoing pain in her right leg, requiring a decompression and fusion.  OPERATIVE DETAILS:  On July 12, 2017, the patient was brought to surgery and general  endotracheal anesthesia was administered.  The patient was placed prone on a flat Jackson bed with a spinal frame.  Antibiotics were given.  A time-out procedure was performed and the back was prepped and draped in the usual sterile fashion.  A midline incision was made in line with the patient's previous incision, extended more superiorly. The fascia was incised at the midline.  The paraspinal musculature was retracted laterally.  I then dissected laterally and was able to identify the previously placed L3-L4 fusion construct.  I then subperiosteally exposed the lamina of L1, L2, and L3, including the bilateral L1-L2 and L2-L3 facet joints.  At this point, using anatomic landmarks in addition to fluoroscopy, I did cannulate the L2 pedicles bilaterally, and the left L1 pedicle.  I was not able to cannulate the right L1 pedicle, given the very small size of the pedicle.  On the left side, the facet joints at L1-L2 and the transverse processes of L1 and L2 and L3 were decorticated using a high-speed bur.  I then placed cortical screws of the appropriate diameter and length into the L1 and L2 pedicles on the left.  A rod was secured into the tulip heads of the screws and caps were placed over the L1 and L2 pedicles.  A connector was also placed over the inferior aspect of the rod, which was secured to the patient's previous fusion construct.  On the right  side, I did place bone wax in the cannulated L2 pedicle.  At this point, I proceeded with the decompression portion of the procedure.  Using a rongeur, I did remove the spinous processes of L1 and L2.  I then performed a bilateral partial facetectomy at the L1-L2 and L2-L3 levels.  On the right side, a thorough neuroforaminal decompression was performed, entirely decompressing the exiting right L2 nerve.  The decompression was then continued up to the L1-L2 level, where a bilateral partial facetectomy was performed.  The traversing right L2  nerve was identified and medially retracted.  Upon doing so, multiple herniated disk fragments were identified in the lateral recess behind the L1 vertebral body and in the region of the L1-L2 intervertebral space, both foraminally and extraforaminally.  In removing these fragments, I was able to thoroughly and completely decompress the right L1 and L2 nerves.  I was very pleased with the decompression.  All bleeding was controlled using bipolar electrocautery, in addition to Surgiflo.  I then used a high- speed bur to decorticate the posterior elements of L1 and L2 and L3.  At this point, a L2 pedicle screw was placed of the appropriate diameter and length.  A rod was then secured into the tulip head of the L2 pedicle screw, and the rod was connected to the previously placed construct as well.  After placing the caps, all caps were final tightened, as was the universal connector.  The wound was copiously irrigated with a total of approximately 2 L of normal saline.  Abundant autograft obtained from the decompression was mixed with allograft in the form of ViviGen, which was packed into the posterior elements and posterolateral gutters bilaterally across L1-L2 and L2-L3.  I was very pleased with the final AP and lateral fluoroscopic images.  A #15 deep Blake drain was then placed deep to the fascia.  The wound was then closed in layers using #1 Vicryl, followed by 2-0 Vicryl, followed by 4- 0 Monocryl.  Benzoin and Steri-Strips were applied, followed by sterile dressing.  All instrument counts were correct at the termination of the procedure.  Of note, Pricilla Holm was my assistant throughout surgery, and did aid in retraction, suctioning, and closure from start to finish.     Phylliss Bob, MD     MD/MEDQ  D:  07/12/2017  T:  07/13/2017  Job:  [id number]

## 2017-07-13 NOTE — Progress Notes (Signed)
    Patient doing well PO day 1 and 2 S/P staged Lat/Post fusion procedure. Pt reports doing better today than yesterday. She reports resolved leg pain, she does have some residual numbness. She states her back is stiff and she is eager to move. She has not yet been OOB with PT or OT. She is eating and drinking well and passing gas.   Physical Exam: BP 117/79 (BP Location: Right Arm)   Pulse (!) 103   Temp 99.4 F (37.4 C) (Oral)   Resp 18   Ht 5\' 7"  (1.702 m)   Wt 108.7 kg (239 lb 10.2 oz)   SpO2 93%   BMI 37.53 kg/m   Dressing's in place on lateral and posterior. Drain in place with 25cc out over last 2 hours, 95cc over 8hrs prior.  NVI  POD #1 & 2 s/p staged LAT/POST Fusion   - Resolved leg pain with residual numbness, not unexpected - Maintain drain, record output Q2hrs, will recheck output over lunch and discuss pulling drain later today vs tomorrow  - up with PT/OT, encourage ambulation  - TLSO brace when OOB (straps under arms)  - Activity as tolerated with back precautions   - Walk pt Q shift minimum  - Percocet for pain, Valium for muscle spasms  - Home scripts to be provided  - likely d/c home today vs tomorrow pending PT progress and medicine team recs/clearance  - F/u in office 2 weeks

## 2017-07-13 NOTE — Progress Notes (Signed)
1 Day Post-Op    CC:  Neck abscess  Subjective: I took the dressing down, not changed since last PM. Wound was packed well and still has allot of purulent drainage.  Erythema is still present, but better than before I&D.  Says she may go home tomorrow.  Just one dressing change yesterday due to second surgery.  Objective: Vital signs in last 24 hours: Temp:  [97.7 F (36.5 C)-99.4 F (37.4 C)] 99.4 F (37.4 C) (03/07 2315) Pulse Rate:  [71-103] 103 (03/07 2315) Resp:  [12-20] 18 (03/07 2315) BP: (87-141)/(48-94) 117/79 (03/08 0350) SpO2:  [93 %-99 %] 93 % (03/07 2315) Weight:  [108.7 kg (239 lb 10.2 oz)] 108.7 kg (239 lb 10.2 oz) (03/08 0500) Last BM Date: 07/10/17 1500 IV 1400 urine Drain 95 Afebrile, Tachycardic, 1 low BP's yesterday. No labs Culture MODERATE STAPHYLOCOCCUS AUREUS  SUSCEPTIBILITIES TO FOLLOW    From culture, sensitivities still pending: We can use to guide home therapy, she was not tolerating full dose of Septra at home.    Intake/Output from previous day: 03/07 0701 - 03/08 0700 In: 1500 [I.V.:1500] Out: 1795 [Urine:1550; Drains:95; Blood:150] Intake/Output this shift: Total I/O In: -  Out: 25 [Drains:25]  General appearance: alert, cooperative and no distress Skin: open site well packed, still some erythema around the site, over midline neck just below hair line.  Still has a fair amount of drainage, just changed x 1 yesterday with second surgery by Ortho  Lab Results:  Recent Labs    07/10/17 1120 07/12/17 0319  WBC 9.5 6.9  HGB 14.3 12.4  HCT 42.0 38.8  PLT 242 170    BMET Recent Labs    07/10/17 1120 07/12/17 0319  NA 132* 137  K 4.0 4.5  CL 96* 103  CO2 25 26  GLUCOSE 130* 132*  BUN 10 8  CREATININE 1.08* 0.97  CALCIUM 9.1 8.3*   PT/INR No results for input(s): LABPROT, INR in the last 72 hours.  Recent Labs  Lab 07/10/17 1120  AST 31  ALT 43  ALKPHOS 105  BILITOT 0.3  PROT 6.0*  ALBUMIN 3.3*     Lipase  No  results found for: LIPASE   Medications: . acidophilus  1 capsule Oral Daily  . amitriptyline  25 mg Oral QHS  . atorvastatin  20 mg Oral Daily  . budesonide  9 mg Oral QHS  . bupivacaine liposome  20 mL Infiltration Once  . cholecalciferol  1,000 Units Oral Daily  . docusate sodium  100 mg Oral BID  . letrozole  2.5 mg Oral Daily  . multivitamin with minerals  1 tablet Oral Daily  . pantoprazole (PROTONIX) IV  40 mg Intravenous QHS  . scopolamine  1 patch Transdermal Q72H  . sodium chloride flush  3 mL Intravenous Q12H  . sodium chloride flush  3 mL Intravenous Q12H    Assessment/Plan Spinal stenosis with lumbar region pain Direct lateral interbody fusion, via a left-sided approach, L1-2,  L2-3.  Placement of anterior instrumentation, L1-2.   Insertion of interbody device x2 (NuVasive intervertebral spacersx2). stage 1 07/11/16, Stage 2 07/12/17, Dr. Phylliss Bob Hypertension Left Breast Cancer with lumpectomy - radiation Rx Hx of depression Prior lumbar fusion Tobacco use  Neck Abscess with I&D 07/04/17/MRSA - Staph Aureus - sensitivities pending - still has allot of drainage   FEN: IV fluids/regular diet ID: Septra 2/24 >>Vancomycin 3/5 =>>day 4 DVT: SCD's Foley: None Follow up: PCP  Plan:  She needs to  get the site washed and get some soap and water into the open site.  Continue dressing with iodoform packing.  If she goes home tomorrow, her daughter needs to know how to do the dressing and get packing to base of the wound.  Culture is above and sensitivities are pending.  Follow up with her PCP.          LOS: 2 days    [name] 07/13/2017 [phone]

## 2017-07-13 NOTE — Progress Notes (Signed)
   DISCHARGE INSTRUCTIONS LUMBAR FUSION  INCISION Keep your bandage on for 5 days following your surgery, then slowly remove it.  You may note small "steri-strips" overlying the incision.  Let these fall off on their own (usually at 3-4 weeks).    If any of the below should occur, please call the office. - Persistent drainage from incisional site - Opening of incisions - Fever greater than 101 F - Flu-like symptoms - Increased redness and/or tenderness    BRACE If you were given a brace, you'll need to wear it when you're out of bed and active.  Always wear a T-shirt under your brace so that it isn't in contact with your bare skin. The brace may cause you to sweat and you may feel warm; this can irritate your incision so pay special attention to the above "incision" instructions.  SHOWERING Please keep the bandage and wound dry for a total of 5 days.  You may shower during this time as long as the wound stays dry.  After 5 days you can get the wound wet.  No tub baths, hot tubs or whirlpools until seen in the office.   EXERCISE - Do not lift objects weighing more than than 10 lbs - Do not bend or twist at the waist-always bend your knees!! - Limit your upright sitting to 30 minute intervals.  You should lie down or walk in between sitting periods.  - There are no limitations for sitting in a recliner chair. - Walk as much as possible, and let discomfort be your guide.  Walking is highly encouraged and will expedite your overall recovery.   - You may also go up and down stairs as much as you can tolerate.     PAIN Take pain medication as prescribed. As your pain level decreases, you may begin to take over-the-counter Extra Strength Tylenol.  DO NOT take any anti-inflammatories (Motrin, Advil, etc.) for 10 weeks after surgery (anti-inflammatories can interfere with fusion healing).  If you previously took Fosamax, do not resume taking it until 12 weeks after your fusion  surgery.  DRIVING  You may not drive a car until told otherwise by Dr. Lynann Bologna (usually at your first office visit).  You may be a passenger for short distances (20-30 minutes).  If you must take a longer trip, make sure to make several pit stops so that you can walk around and stretch your legs.  Reclining the passenger seat seems to be the most comfortable position for most patients.          FOLLOW-UP APPOINTMENT You will be given an appointment card with a follow-up appointment date and time.  If you did not, or if it gets misplaced, call [phone].

## 2017-07-13 NOTE — Evaluation (Signed)
Physical Therapy Evaluation Patient Details Name: [name] MRN: [id number] DOB: [birth year] Today's Date: 07/13/2017   History of Present Illness  Pt is a 57 y.o. admitted 07/10/17 for evaluation of ongoing lower back pain and neck abscess. Now s/p a 2-part surgery: L1-3 lateral interbody fusion on 3/7, and L1-3 posterior decompression and fusion on 3/8. PMH includes lumbar fusion (04/2013), HTN, breast CA, arthritis, depression.    Clinical Impression  Patient evaluated by Physical Therapy with no further acute PT needs identified. Pt able to amb 600' mod indep with RW. Reviewed back precautions, brace application, LE therex, fall risk reduction, and importance of continued mobility. Pt with good ability to maintain precautions throughout session. All education has been completed and the patient has no further questions. Pt encouraged to continue ambulating often during hospital admission (RN notified). PT is signing off. Thank you for this referral.    Follow Up Recommendations No PT follow up    Equipment Recommendations  None recommended by PT    Recommendations for Other Services       Precautions / Restrictions Precautions Precautions: Back Precaution Comments: Verbally reviewed precautions Required Braces or Orthoses: Spinal Brace Spinal Brace: Thoracolumbosacral orthotic;Applied in sitting position Restrictions Weight Bearing Restrictions: No      Mobility  Bed Mobility Overal bed mobility: Independent             General bed mobility comments: Indep with log roll technique. Did not require any education  Transfers Overall transfer level: Modified independent Equipment used: Rolling walker (2 wheeled) Transfers: Sit to/from Stand           General transfer comment: Mod indep with RW; indep with no DME  Ambulation/Gait Ambulation/Gait assistance: Modified independent (Device/Increase time) Ambulation Distance (Feet): 600 Feet Assistive device: Rolling  walker (2 wheeled) Gait Pattern/deviations: Step-through pattern;Decreased stride length Gait velocity: Decreased   General Gait Details: Mod indep with RW  Stairs            Wheelchair Mobility    Modified Rankin (Stroke Patients Only)       Balance Overall balance assessment: Needs assistance   Sitting balance-Leahy Scale: Good Sitting balance - Comments: Indep to bring foot to knee to don socks while maintain back precautions     Standing balance-Leahy Scale: Fair Standing balance comment: Can static stand with no UE support; improved dynamic stability with RW                             Pertinent Vitals/Pain Pain Assessment: 0-10 Pain Score: 7  Pain Location: Drain site Pain Descriptors / Indicators: Tender Pain Intervention(s): Monitored during session    Home Living Family/patient expects to be discharged to:: Private residence Living Arrangements: Children Available Help at Discharge: Family;Available 24 hours/day Type of Home: House Home Access: Stairs to enter Entrance Stairs-Rails: Right Entrance Stairs-Number of Steps: 3 Home Layout: One level Home Equipment: Walker - 2 wheels;Cane - single point      Prior Function Level of Independence: Independent               Hand Dominance        Extremity/Trunk Assessment   Upper Extremity Assessment Upper Extremity Assessment: Overall WFL for tasks assessed    Lower Extremity Assessment Lower Extremity Assessment: Overall WFL for tasks assessed;RLE deficits/detail;LLE deficits/detail RLE Deficits / Details: atrophy noted in R anterior thigh with strength 5/5. Pt notes numbness in anterior thigh that feels like  a "knot" and extends to 4-5th toe RLE Sensation: decreased light touch    Cervical / Trunk Assessment Cervical / Trunk Assessment: Normal  Communication   Communication: No difficulties  Cognition Arousal/Alertness: Awake/alert Behavior During Therapy: WFL for tasks  assessed/performed Overall Cognitive Status: Within Functional Limits for tasks assessed                                        General Comments      Exercises General Exercises - Lower Extremity Ankle Circles/Pumps: AROM;Both;10 reps;Seated Quad Sets: AROM;Right;5 reps;Seated Long Arc Quad: AROM;Right;5 reps;Seated   Assessment/Plan    PT Assessment Patent does not need any further PT services  PT Problem List         PT Treatment Interventions      PT Goals (Current goals can be found in the Care Plan section)  Acute Rehab PT Goals PT Goal Formulation: All assessment and education complete, DC therapy    Frequency     Barriers to discharge        Co-evaluation               AM-PAC PT "6 Clicks" Daily Activity  Outcome Measure Difficulty turning over in bed (including adjusting bedclothes, sheets and blankets)?: None Difficulty moving from lying on back to sitting on the side of the bed? : None Difficulty sitting down on and standing up from a chair with arms (e.g., wheelchair, bedside commode, etc,.)?: None Help needed moving to and from a bed to chair (including a wheelchair)?: None Help needed walking in hospital room?: None Help needed climbing 3-5 steps with a railing? : A Little 6 Click Score: 23    End of Session Equipment Utilized During Treatment: Back brace Activity Tolerance: Patient tolerated treatment well Patient left: in chair;with call bell/phone within reach Nurse Communication: Mobility status PT Visit Diagnosis: Other abnormalities of gait and mobility (R26.89)    Time: 7829-5621 PT Time Calculation (min) (ACUTE ONLY): 26 min   Charges:   PT Evaluation $PT Eval Moderate Complexity: 1 Mod PT Treatments $Self Care/Home Management: 8-22   PT G Codes:       Mabeline Caras, PT, DPT Acute Rehab Services  Pager: [place] 07/13/2017, 4:23 PM

## 2017-07-13 NOTE — Progress Notes (Addendum)
PROGRESS NOTE    [name]   SLH:[id number]  DOB: 10/27/1960  DOA: 07/10/2017 PCP: [name], MD   Brief Narrative:  [name] is a 57 y.o.femalewith a past medical history of hypertension, breast cancer status post radiation therapy completed 2 years ago, who presents to ED for evaluation of ongoing lower back pain for the past several months. She states that her orthopedic provider sent her to the ED for pain control, admission and surgery for radiculopathyL1-L2 (causing right thigh pain). She states that despite the use of hydrocodone on at home, she continues to have pain. She also has a history of lumbar fusion surgery several years ago. The patient also has an abscess to the back of her neckwhich is actively draining purulent material. She had the area drained approximately 1 week ago and reports compliance with her home Bactrim.  She was found to be hypotensive in the ER- BP 89/62, Temp 99.1.   Subjective: Mild back pain. No other complaints today. ROS: no complaints of nausea, vomiting, constipation diarrhea, cough, dyspnea or dysuria. No other complaints.   Assessment & Plan:   Principal Problem:   Hypotension with h/o HTN - has resolved with IVF- ? If it was due to pain medications taken at home in addition to antihypertensives which are currently on hold  Active Problems:   Spinal stenosis, lumbar region L1-L2 with neurogenic claudication - underwent interbody fusion of L1-2 and insertion of "interbody device x 2" on 3/6 by Dr Lynann Bologna    Posterior Neck abscess - initially lanced at PCP's office on 2/27 and apparently grew MRSA - placed on Bactrim after lancing but continues to drain - culture sent on 3/6- gr stain shows rare WBC (mostly PMN) and Gr + cocci - MRSA PCR is +- culture growing stap aureus - currently on Vancomycin- general surgery following - they do not feel further I and D is needed- cont wound care/ packing per gen surgery-    Hyponatremia - sodium of 132 on admission possibly due to HCTZ/ dehydration - has improved after IVF- HCTZ on hold for now    Breast cancer of upper-outer quadrant of left female breast ([place]) - cont Letrozole  Lymphocytic colitis - cont Budesonide   DVT prophylaxis: SCDs Code Status: Full code Family Communication:  Disposition Plan: follow in hospital - plan per ortho Consultants:   Ortho  gen surgery Antimicrobials:  Anti-infectives (From admission, onward)   Start     Dose/Rate Route Frequency Ordered Stop   07/11/17 0500  vancomycin (VANCOCIN) IVPB 750 mg/150 ml premix     750 mg 150 mL/hr over 60 Minutes Intravenous Every 12 hours 07/10/17 1622     07/10/17 1700  vancomycin (VANCOCIN) 2,000 mg in sodium chloride 0.9 % 500 mL IVPB     2,000 mg 250 mL/hr over 120 Minutes Intravenous  Once 07/10/17 1617 07/10/17 2026       Objective: Vitals:   07/13/17 0350 07/13/17 0500 07/13/17 0858 07/13/17 1250  BP: 117/79  (!) 102/54 110/60  Pulse:   94 90  Resp:   18 18  Temp:    98.3 F (36.8 C)  TempSrc:   Oral Oral  SpO2:   99% 99%  Weight:  108.7 kg (239 lb 10.2 oz)    Height:        Intake/Output Summary (Last 24 hours) at 07/13/2017 1534 Last data filed at 07/13/2017 1023 Gross per 24 hour  Intake 1800 ml  Output 620 ml  Net 1180 ml   Filed Weights   07/11/17 0500 07/12/17 0500 07/13/17 0500  Weight: 106.8 kg (235 lb 7.2 oz) 107.7 kg (237 lb 7 oz) 108.7 kg (239 lb 10.2 oz)    Examination: General exam: Appears comfortable  HEENT: PERRLA, oral mucosa moist, no sclera icterus or thrush Respiratory system: Clear to auscultation. Respiratory effort normal. Cardiovascular system: S1 & S2 heard, RRR.  No murmurs  Gastrointestinal system: Abdomen soft, non-tender, nondistended. Normal bowel sound. No organomegaly Central nervous system: Alert and oriented. No focal neurological deficits. Extremities: No cyanosis, clubbing or edema Psychiatry:  Mood & affect  appropriate.     Data Reviewed: I have personally reviewed following labs and imaging studies  CBC: Recent Labs  Lab 07/10/17 1120 07/12/17 0319  WBC 9.5 6.9  HGB 14.3 12.4  HCT 42.0 38.8  MCV 91.1 93.0  PLT 242 786   Basic Metabolic Panel: Recent Labs  Lab 07/10/17 1120 07/12/17 0319  NA 132* 137  K 4.0 4.5  CL 96* 103  CO2 25 26  GLUCOSE 130* 132*  BUN 10 8  CREATININE 1.08* 0.97  CALCIUM 9.1 8.3*   GFR: Estimated Creatinine Clearance: 83.2 mL/min (by C-G formula based on SCr of 0.97 mg/dL). Liver Function Tests: Recent Labs  Lab 07/10/17 1120  AST 31  ALT 43  ALKPHOS 105  BILITOT 0.3  PROT 6.0*  ALBUMIN 3.3*   No results for input(s): LIPASE, AMYLASE in the last 168 hours. No results for input(s): AMMONIA in the last 168 hours. Coagulation Profile: No results for input(s): INR, PROTIME in the last 168 hours. Cardiac Enzymes: No results for input(s): CKTOTAL, CKMB, CKMBINDEX, TROPONINI in the last 168 hours. BNP (last 3 results) No results for input(s): PROBNP in the last 8760 hours. HbA1C: No results for input(s): HGBA1C in the last 72 hours. CBG: Recent Labs  Lab 07/11/17 0640 07/12/17 0633  GLUCAP 142* 128*   Lipid Profile: No results for input(s): CHOL, HDL, LDLCALC, TRIG, CHOLHDL, LDLDIRECT in the last 72 hours. Thyroid Function Tests: No results for input(s): TSH, T4TOTAL, FREET4, T3FREE, THYROIDAB in the last 72 hours. Anemia Panel: No results for input(s): VITAMINB12, FOLATE, FERRITIN, TIBC, IRON, RETICCTPCT in the last 72 hours. Urine analysis:    Component Value Date/Time   COLORURINE YELLOW 02/20/2016 2221   APPEARANCEUR CLOUDY (A) 02/20/2016 2221   LABSPEC 1.018 02/20/2016 2221   PHURINE 5.5 02/20/2016 2221   GLUCOSEU NEGATIVE 02/20/2016 2221   HGBUR NEGATIVE 02/20/2016 2221   BILIRUBINUR NEGATIVE 02/20/2016 2221   KETONESUR NEGATIVE 02/20/2016 2221   PROTEINUR NEGATIVE 02/20/2016 2221   UROBILINOGEN 0.2 04/18/2013 0950    NITRITE NEGATIVE 02/20/2016 2221   LEUKOCYTESUR NEGATIVE 02/20/2016 2221   Sepsis Labs: @LABRCNTIP (procalcitonin:4,lacticidven:4) ) Recent Results (from the past 240 hour(s))  Aerobic Culture (superficial specimen)     Status: None   Collection Time: 07/11/17 10:05 AM  Result Value Ref Range Status   Specimen Description NECK  Final   Special Requests Normal  Final   Gram Stain   Final    RARE WBC PRESENT, PREDOMINANTLY PMN RARE GRAM POSITIVE COCCI Performed at [facility], [place] [address], [place], [place] [address]    Culture   Final    MODERATE METHICILLIN RESISTANT STAPHYLOCOCCUS AUREUS   Report Status 07/13/2017 FINAL  Final   Organism ID, Bacteria METHICILLIN RESISTANT STAPHYLOCOCCUS AUREUS  Final      Susceptibility   Methicillin resistant staphylococcus aureus - MIC*    CIPROFLOXACIN >=  8 RESISTANT Resistant     ERYTHROMYCIN >=8 RESISTANT Resistant     GENTAMICIN <=0.5 SENSITIVE Sensitive     OXACILLIN >=4 RESISTANT Resistant     TETRACYCLINE <=1 SENSITIVE Sensitive     VANCOMYCIN 1 SENSITIVE Sensitive     TRIMETH/SULFA <=10 SENSITIVE Sensitive     CLINDAMYCIN <=0.25 SENSITIVE Sensitive     RIFAMPIN <=0.5 SENSITIVE Sensitive     Inducible Clindamycin NEGATIVE Sensitive     * MODERATE METHICILLIN RESISTANT STAPHYLOCOCCUS AUREUS  Culture, blood (Routine X 2) w Reflex to ID Panel     Status: None (Preliminary result)   Collection Time: 07/11/17 10:50 AM  Result Value Ref Range Status   Specimen Description BLOOD RIGHT ARM  Final   Special Requests   Final    IN PEDIATRIC BOTTLE Blood Culture results may not be optimal due to an excessive volume of blood received in culture bottles   Culture   Final    NO GROWTH 2 DAYS Performed at [facility] [address], [place], [place] [address]    Report Status PENDING  Incomplete  Culture, blood (single)     Status: None (Preliminary result)   Collection Time: 07/11/17 10:55 AM  Result Value Ref Range Status     Specimen Description BLOOD RIGHT ARM  Final   Special Requests   Final    IN PEDIATRIC BOTTLE Blood Culture results may not be optimal due to an excessive volume of blood received in culture bottles   Culture   Final    NO GROWTH 2 DAYS Performed at [facility], [place] [address], [place], [place] [address]    Report Status PENDING  Incomplete  MRSA PCR Screening     Status: Abnormal   Collection Time: 07/11/17 12:12 PM  Result Value Ref Range Status   MRSA by PCR POSITIVE (A) NEGATIVE Final    Comment:        The GeneXpert MRSA Assay (FDA approved for NASAL specimens only), is one component of a comprehensive MRSA colonization surveillance program. It is not intended to diagnose MRSA infection nor to guide or monitor treatment for MRSA infections. RESULT CALLED TO, READ BACK BY AND VERIFIED WITH: Thompson Caul RN 14:50 07/11/17 ([email or web address])          Radiology Studies: Dg Lumbar Spine 2-3 Views  Result Date: 07/12/2017 CLINICAL DATA:  Lumbar decompression fusion EXAM: LUMBAR SPINE - 2-3 VIEW; DG C-ARM 61-120 MIN COMPARISON:  None. FINDINGS: Two intraoperative fluoroscopic images are provided during lumbar decompression and fusion at the L1-2 and L2-3 levels. IMPRESSION: Intraoperative fluoroscopy. Electronically Signed   By: Ulyses Jarred M.D.   On: 07/12/2017 19:36   Dg Lumbar Spine 2-3 Views  Result Date: 07/11/2017 CLINICAL DATA:  L1-2, L2-3 fusion EXAM: DG C-ARM 61-120 MIN; LUMBAR SPINE - 2-3 VIEW COMPARISON:  06/17/2017 FLUOROSCOPY TIME:  Fluoroscopy Time:  3 minutes 30 seconds Radiation Exposure Index (if provided by the fluoroscopic device): Not available Number of Acquired Spot Images: 2 FINDINGS: There again noted pedicle screws at L3 and L4 with interbody fusion. Subsequent interbody fusion at L2-3 and L1-2 is noted. Left-sided lateral fixation is noted with fixation screws extending into the vertebral body at L1 and L2. IMPRESSION: L1-2 and L2-3 fusion. Electronically  Signed   By: Inez Catalina M.D.   On: 07/11/2017 17:01   Ct Lumbar Spine Wo Contrast  Result Date: 07/12/2017 CLINICAL DATA:  Initial evaluation for ongoing lower back pain, history of  prior spinal fusion earlier the same day. EXAM: CT LUMBAR SPINE WITHOUT CONTRAST TECHNIQUE: Multidetector CT imaging of the lumbar spine was performed without intravenous contrast administration. Multiplanar CT image reconstructions were also generated. COMPARISON:  Prior radiograph from earlier the same day as well as previous MRI from 07/02/2017. FINDINGS: Segmentation: Transitional lumbosacral anatomy. Same numbering system is employed as on previous exams. Alignment: Stable amount with mild levoscoliosis. Anterolisthesis at the previously fused L3-4 level is unchanged. Trace retrolisthesis of L2 on L3 also relatively similar. Vertebrae: Vertebral body heights maintained without evidence for acute or interval fracture. No discrete lytic or blastic osseous lesions. Reactive endplate changes present about the L2-3 and L4-5 interspaces. Patient status post PLIF at the L3-4 level. Hardware at this level appears well positioned and intact without periprosthetic lucency to suggest loosening or failure. Postoperative changes from recent left lateral and interbody fusion present at L1-2. Hardware appears well aligned intact, although the inferior screw at L2 closely approximates the superior endplate of L2. Interbody grafts have been placed in within the L1-2 and L2-3 interspaces and appear well positioned Paraspinal and other soft tissues: Mild edema with scattered foci of postoperative emphysema present within the psoas musculature bilaterally, right greater than left. No discrete collections. Aortic atherosclerosis noted. Visualized visceral structures within normal limits. Disc levels: T11-12: Mild diffuse disc bulging with intervertebral disc space narrowing. Superimposed left paracentral disc protrusion with annular calcification  (series 5, image 20). No significant canal or foraminal stenosis. T12-L1: Mild diffuse disc bulge. No canal or foraminal stenosis. L1-2: Interval performance of left lateral and interbody fixation. Previously identified right subarticular and foraminal disc extrusion with cephalad migration again seen (series 9, image 28, 23). This is similar relative to recent MRI. Resultant moderate right lateral recess with severe right L1 foraminal narrowing relatively similar. Superimposed right greater than left facet hypertrophy. Overall mild central canal stenosis. Left neural foramen remains patent. L2-3: Interval performance of interbody fusion. Scattered disc desiccation with vacuum disc phenomenon noted. Right eccentric disc osteophyte complex with superimposed bilateral bulky facet hypertrophy again seen. There is resultant moderate spinal stenosis with severe right lateral recess narrowing and severe right L2 foraminal stenosis. More moderate left L1 foraminal narrowing. L3-4:  Prior PLIF.  No residual stenosis. L4-5: Left eccentric disc bulge with intervertebral disc space narrowing. Mild vacuum disc phenomenon. Moderate left greater than right facet and ligament flavum hypertrophy. Resultant mild to moderate left lateral recess stenosis with moderate left L4 foraminal narrowing. No significant right foraminal narrowing. Central canal remains patent. L5-S1: Transitional lumbosacral anatomy with partial sacralization of the L5 vertebral body. Partial ankylosis of the left-sided facets. Sclerotic left-sided assimilation joint. Epidural lipomatosis. No significant stenosis. IMPRESSION: 1. Postoperative changes from interval left lateral and interbody fusion at L1-2, with additional interbody fusion at L2-3. No complication. 2. Persistent moderate sized right subarticular and foraminal disc extrusion at L1-2 with resultant moderate right lateral recess with severe right L1 foraminal narrowing. 3. Right eccentric disc  osteophyte with facet hypertrophy at L2-3 with resultant severe right lateral recess and right L2 foraminal stenosis. 4. Prior fusion at L3-4 without residual stenosis. 5. Left eccentric disc osteophyte with facet hypertrophy at L4-5 with resultant mild to moderate left lateral recess and left L4 foraminal narrowing. 6. Transitional lumbosacral anatomy. Electronically Signed   By: Jeannine Boga M.D.   On: 07/12/2017 00:26   Dg Lumbar Spine 1 View  Result Date: 07/12/2017 CLINICAL DATA:  Intraoperative localization EXAM: LUMBAR SPINE - 1 VIEW COMPARISON:  07/11/2017 FINDINGS: Five lumbar type vertebral bodies are well visualized. Changes of prior fusion are noted at L1-2, L2-3 and L3-4. Lateral fixation on the left at L1-2 is noted as well as pedicle screws bilaterally at L3-4. Needles are noted in the posterior soft tissues at the T12-L1 interspace as well as at the L2-3 interspace. IMPRESSION: Intraoperative localization at T12-L1 and L2-L3. Electronically Signed   By: Inez Catalina M.D.   On: 07/12/2017 16:11   Dg C-arm 1-60 Min  Result Date: 07/12/2017 CLINICAL DATA:  Lumbar decompression fusion EXAM: LUMBAR SPINE - 2-3 VIEW; DG C-ARM 61-120 MIN COMPARISON:  None. FINDINGS: Two intraoperative fluoroscopic images are provided during lumbar decompression and fusion at the L1-2 and L2-3 levels. IMPRESSION: Intraoperative fluoroscopy. Electronically Signed   By: Ulyses Jarred M.D.   On: 07/12/2017 19:36   Dg C-arm 1-60 Min  Result Date: 07/12/2017 CLINICAL DATA:  Lumbar decompression fusion EXAM: LUMBAR SPINE - 2-3 VIEW; DG C-ARM 61-120 MIN COMPARISON:  None. FINDINGS: Two intraoperative fluoroscopic images are provided during lumbar decompression and fusion at the L1-2 and L2-3 levels. IMPRESSION: Intraoperative fluoroscopy. Electronically Signed   By: Ulyses Jarred M.D.   On: 07/12/2017 19:36   Dg C-arm 1-60 Min  Result Date: 07/11/2017 CLINICAL DATA:  L1-2, L2-3 fusion EXAM: DG C-ARM 61-120 MIN;  LUMBAR SPINE - 2-3 VIEW COMPARISON:  06/17/2017 FLUOROSCOPY TIME:  Fluoroscopy Time:  3 minutes 30 seconds Radiation Exposure Index (if provided by the fluoroscopic device): Not available Number of Acquired Spot Images: 2 FINDINGS: There again noted pedicle screws at L3 and L4 with interbody fusion. Subsequent interbody fusion at L2-3 and L1-2 is noted. Left-sided lateral fixation is noted with fixation screws extending into the vertebral body at L1 and L2. IMPRESSION: L1-2 and L2-3 fusion. Electronically Signed   By: Inez Catalina M.D.   On: 07/11/2017 17:01   Dg C-arm 1-60 Min  Result Date: 07/11/2017 CLINICAL DATA:  L1-2, L2-3 fusion EXAM: DG C-ARM 61-120 MIN; LUMBAR SPINE - 2-3 VIEW COMPARISON:  06/17/2017 FLUOROSCOPY TIME:  Fluoroscopy Time:  3 minutes 30 seconds Radiation Exposure Index (if provided by the fluoroscopic device): Not available Number of Acquired Spot Images: 2 FINDINGS: There again noted pedicle screws at L3 and L4 with interbody fusion. Subsequent interbody fusion at L2-3 and L1-2 is noted. Left-sided lateral fixation is noted with fixation screws extending into the vertebral body at L1 and L2. IMPRESSION: L1-2 and L2-3 fusion. Electronically Signed   By: Inez Catalina M.D.   On: 07/11/2017 17:01   Dg C-arm 1-60 Min  Result Date: 07/11/2017 CLINICAL DATA:  L1-2, L2-3 fusion EXAM: DG C-ARM 61-120 MIN; LUMBAR SPINE - 2-3 VIEW COMPARISON:  06/17/2017 FLUOROSCOPY TIME:  Fluoroscopy Time:  3 minutes 30 seconds Radiation Exposure Index (if provided by the fluoroscopic device): Not available Number of Acquired Spot Images: 2 FINDINGS: There again noted pedicle screws at L3 and L4 with interbody fusion. Subsequent interbody fusion at L2-3 and L1-2 is noted. Left-sided lateral fixation is noted with fixation screws extending into the vertebral body at L1 and L2. IMPRESSION: L1-2 and L2-3 fusion. Electronically Signed   By: Inez Catalina M.D.   On: 07/11/2017 17:01      Scheduled Meds: .  acidophilus  1 capsule Oral Daily  . amitriptyline  25 mg Oral QHS  . atorvastatin  20 mg Oral Daily  . budesonide  9 mg Oral QHS  . bupivacaine liposome  20 mL Infiltration Once  . cholecalciferol  1,000 Units Oral Daily  . docusate sodium  100 mg Oral BID  . letrozole  2.5 mg Oral Daily  . multivitamin with minerals  1 tablet Oral Daily  . pantoprazole (PROTONIX) IV  40 mg Intravenous QHS  . scopolamine  1 patch Transdermal Q72H  . sodium chloride flush  3 mL Intravenous Q12H  . sodium chloride flush  3 mL Intravenous Q12H   Continuous Infusions: . sodium chloride    . 0.9 % NaCl with KCl 20 mEq / L 10 mL/hr at 07/11/17 2120  . lactated ringers 10 mL/hr at 07/11/17 1251  . lactated ringers 10 mL/hr at 07/12/17 1234  . vancomycin Stopped (07/13/17 0648)     LOS: 2 days    Time spent in minutes: 35    Debbe Odea, MD [facility] Pager: [email or web address] Password [facility] 07/13/2017, 3:34 PM

## 2017-07-13 NOTE — Progress Notes (Signed)
Pharmacy Antibiotic Note  [name] is a 57 y.o. female on day # 4 Vancomycin for MRSA neck abscess.  On Vanc 750 mg IV q12h, but has missed PM Vanc doses on 3/6 and 3/7 while in the OR. Ancef 2gm IV given both days in the OR.  Plan:  Continue Vancomycin 750 mg IV q12hrs.  Vanc trough level prior to 4th consecutive dose, due at 5pm on 07/14/17.  Follow renal function, final blood cultures, progress.  Height: 5\' 7"  (170.2 cm) Weight: 239 lb 10.2 oz (108.7 kg) IBW/kg (Calculated) : 61.6  Temp (24hrs), Avg:98.3 F (36.8 C), Min:97.7 F (36.5 C), Max:99.4 F (37.4 C)  Recent Labs  Lab 07/10/17 1120 07/10/17 1326 07/12/17 0319  WBC 9.5  --  6.9  CREATININE 1.08*  --  0.97  LATICACIDVEN  --  1.20  --     Estimated Creatinine Clearance: 83.2 mL/min (by C-G formula based on SCr of 0.97 mg/dL).    Allergies  Allergen Reactions  . Nsaids Other (See Comments)    LYMPHOCYTIC COLITIS  . Soap Shortness Of Breath and Rash    IVORY SOAP  . Adhesive [Tape] Other (See Comments)    BLISTERS  . Augmentin [Amoxicillin-Pot Clavulanate] Nausea And Vomiting    Has patient had a PCN reaction causing immediate rash, facial/tongue/throat swelling, SOB or lightheadedness with hypotension: Unknown Has patient had a PCN reaction causing severe rash involving mucus membranes or skin necrosis: Unknown Has patient had a PCN reaction that required hospitalization Unknown Has patient had a PCN reaction occurring within the last 10 years: Unknown If all of the above answers are "NO", then may proceed with Cephalosporin use.   . Latex Itching and Other (See Comments)    BLISTERS  . Chlorhexidine Gluconate Rash    Pt says her skin looked like poison ivy  . Cymbalta [Duloxetine Hcl] Nausea Only  . Mupirocin Rash  . Oxycodone Nausea Only    ON EMPTY STOMACH    Antimicrobials this admission:  Vancomycin 3/5>>   Ancef in OR on 3/6 and 3/7  Dose adjustments this admission:  n/a  Microbiology  results:   3/6 blood x 2 - ng x 2 days to date   3/6 neck - moderate MRSA - MIC to Vanc = 1; sens TCN, septra; R Cipro, Emycin   3/6 MRSA PCR - positive; intolerant to both CHG and Mupirocin  Thank you for allowing pharmacy to be a part of this patient's care.  Arty Baumgartner, [place] Pager: [phone] 07/13/2017 3:54 PM

## 2017-07-14 LAB — GLUCOSE, CAPILLARY: Glucose-Capillary: 97 mg/dL (ref 65–99)

## 2017-07-14 MED ORDER — SULFAMETHOXAZOLE-TRIMETHOPRIM 800-160 MG PO TABS
1.0000 | ORAL_TABLET | Freq: Two times a day (BID) | ORAL | Status: DC
  Administered 2017-07-14: 1 via ORAL
  Filled 2017-07-14: qty 1

## 2017-07-14 MED ORDER — DIAZEPAM 5 MG PO TABS: 5.0000 mg | ORAL_TABLET | Freq: Four times a day (QID) | ORAL | 0 refills | Status: DC | PRN

## 2017-07-14 MED ORDER — SULFAMETHOXAZOLE-TRIMETHOPRIM 800-160 MG PO TABS: 1.0000 | ORAL_TABLET | Freq: Two times a day (BID) | ORAL | 0 refills | Status: DC

## 2017-07-14 MED ORDER — OXYCODONE-ACETAMINOPHEN 5-325 MG PO TABS: 1.0000 | ORAL_TABLET | ORAL | 0 refills | Status: DC | PRN

## 2017-07-14 MED ORDER — ONDANSETRON HCL 4 MG PO TABS: 4.0000 mg | ORAL_TABLET | Freq: Every day | ORAL | 1 refills | Status: AC | PRN

## 2017-07-14 NOTE — Progress Notes (Signed)
Patient ambulated with brace & walker around the nurses station 3 time & appeared to tolerated well

## 2017-07-14 NOTE — Progress Notes (Signed)
Patient ambulated with braces & walker  around the nurses station 4 times   Patient appeared to tolerated well

## 2017-07-14 NOTE — Progress Notes (Addendum)
Subjective: 2 Days Post-Op Procedure(s) (LRB): RIGHT SIDED LUIMBAR 1-2/2-3 DECOMPRESSION AND FUSION AND POSTERIOR INSTRUMENTATION (Right) Patient reports pain as moderate.  Taking by mouth and voiding okay.  Denies leg pain.  Overall back feels good.  She is concerned about her neck.  Objective: Vital signs in last 24 hours: Temp:  [97.5 F (36.4 C)-98.8 F (37.1 C)] 98.4 F (36.9 C) (03/09 0821) Pulse Rate:  [80-95] 81 (03/09 0821) Resp:  [18-19] 18 (03/09 0821) BP: (102-151)/(54-92) 114/61 (03/09 0821) SpO2:  [94 %-99 %] 94 % (03/09 0821) Weight:  [106.7 kg (235 lb 4.8 oz)] 106.7 kg (235 lb 4.8 oz) (03/09 0420)  Intake/Output from previous day: 03/08 0701 - 03/09 0700 In: 300 [IV Piggyback:300] Out: 1940 [Urine:1800; Drains:140] Intake/Output this shift: No intake/output data recorded.  Recent Labs    07/12/17 0319  HGB 12.4   Recent Labs    07/12/17 0319  WBC 6.9  RBC 4.17  HCT 38.8  PLT 170   Recent Labs    07/12/17 0319  NA 137  K 4.5  CL 103  CO2 26  BUN 8  CREATININE 0.97  GLUCOSE 132*  CALCIUM 8.3*   No results for input(s): LABPT, INR in the last 72 hours. Lumbar spine exam: Lumbar incision is clean and dry.  No drainage.  JP drain is intact.  Drain was emptied at 3 AM with approximately 25 cc and this morning has 25 cc of drainage.  She moves her lower extremities without difficulty.  She appears comfortable.  She is sitting on the side of the bed.  NV intact to bilateral lower extremities.  Assessment/Plan: 2 Days Post-Op Procedure(s) (LRB): RIGHT SIDED LUIMBAR 1-2/2-3 DECOMPRESSION AND FUSION AND POSTERIOR INSTRUMENTATION (Right) Plan: JP drain removed. May be up and ambulate with back brace in place. Okay from orthopedic viewpoint to discharge today. I have written a prescription for Percocet and Valium. She will need to follow-up with Dr. Lynann Bologna in 2 weeks.  We will leave it up to the medicine service length of time treating with IV  vancomycin for the area on her neck.  She will need to go home on oral antibiotics as well to cover that. I spoke with Dr Lynann Bologna and he suggested having general surgery confirm tx plan for neck prior to dc home.  [place] G 07/14/2017, 8:42 AM

## 2017-07-14 NOTE — Care Management Note (Signed)
Case Management Note  Patient Details  Name: [name] MRN: [id number] Date of Birth: [birth year]  Subjective/Objective:     Pt presented for back surgery and MRSA -infected wound on her neck.  Pt requests [facility] RN to f/u for wounds and dressing changes.                Action/Plan: Pt requested [facility] but [facility] unable to take nursing referrals at this time due to staffing.  Adacia with [facility] contacted and accepted referral.  Pt advised SOC for Monday.  Expected Discharge Date:  07/14/17               Expected Discharge Plan:  [place]  In-House Referral:  NA  Discharge planning Services  CM Consult  Post Acute Care Choice:  Home Health Choice offered to:  Patient  DME Arranged:  N/A DME Agency:  NA  HH Arranged:  RN [place] Agency:  [facility]  Status of Service:  In process, will continue to follow  If discussed at Long Length of Stay Meetings, dates discussed:    Additional Comments:  Claudie Leach, RN 07/14/2017, 1:22 PM

## 2017-07-14 NOTE — Discharge Summary (Signed)
Physician Discharge Summary  [name] FGH:[id number] DOB: 1961-04-25 DOA: 07/10/2017  PCP: Anna Sacramento, MD  Admit date: 07/10/2017 Discharge date: 07/14/2017  Admitted From: home Disposition:  home   Recommendations for Outpatient Follow-up:  1. F/u with PCP for wound check in 3-4 days 2. F/u on sodium level on HCTZ  Discharge Condition:  stable   CODE STATUS:  Full code   Consultations:  Ortho  General sugery    Discharge Diagnoses:  Principal Problem:   Hypotension Active Problems:   Spinal stenosis, lumbar region, with neurogenic claudication   Neck abscess   Lymphocytic colitis   Breast cancer of upper-outer quadrant of left female breast ([place])   Hyponatremia   Brief Summary: [name] is a 57 y.o.femalewith a past medical history of hypertension, breast cancer status post radiation therapy completed 2 years ago, who presents to ED for evaluation of ongoing lower back pain for the past several months. She states that her orthopedic provider sent her to the ED for pain control, admission and surgery for radiculopathyL1-L2 (causing right thigh pain). She states that despite the use of hydrocodone on at home, she continues to have pain. She also has a history of lumbar fusion surgery several years ago. The patient also has an abscess to the back of her neckwhich is actively draining purulent material. She had the area drained approximately 1 week ago and reports compliance with her home Bactrim.  She was found to be hypotensive in the ER- BP 89/62, Temp 99.1.     Hospital Course:  Principal Problem:   Hypotension with h/o HTN - has resolved with IVF- ? If it was due to pain medications taken at home in addition to antihypertensives which are currently on hold  Active Problems:   Spinal stenosis, lumbar region L1-L2 with neurogenic claudication - underwent 2 day staged procedure (decompression and fusion) by Dr Lynann Bologna - walking with brace now- f/u  with Dr Lynann Bologna in 2 wks    Posterior Neck abscess - initially lanced at PCP's office on 2/27 and apparently grew MRSA - placed on Bactrim after lancing but continued to drain - culture sent on 3/6-  Growing MRSA sensitive to Bactrim and Doxy - currently on Vancomycin- general surgery following - they do not feel further I and D is needed- I have spoken with Dr Kae Heller today that the wound is still draining a small amount of pus - she recommends to cont wound care with packing and continue antibiotics for 10 days and does not feel she needs anything further at this point - she states she becomes nauseated with Bactrim and I have advised that she take Doxycycline but she states she does not tolerate it well either and would rather take Bactrim-  I have given her a new prescription- also prescribed Zofran  Hyponatremia - sodium of 132 on admission possibly due to HCTZ/ dehydration - has improved after IVF    Breast cancer of upper-outer quadrant of left female breast ([place]) - cont Letrozole  Lymphocytic colitis - cont Budesonide  Discharge Exam: Vitals:   07/14/17 0821 07/14/17 0911  BP: 114/61 (!) 159/87  Pulse: 81 80  Resp: 18 18  Temp: 98.4 F (36.9 C) 98 F (36.7 C)  SpO2: 94% 99%   Vitals:   07/14/17 0015 07/14/17 0420 07/14/17 0821 07/14/17 0911  BP: (!) 141/83 (!) 151/92 114/61 (!) 159/87  Pulse: 80 85 81 80  Resp: 19 19 18 18   Temp: (!) 97.5 F (36.4  C) 98 F (36.7 C) 98.4 F (36.9 C) 98 F (36.7 C)  TempSrc: Oral Oral Oral Oral  SpO2: 98% 97% 94% 99%  Weight:  106.7 kg (235 lb 4.8 oz)    Height:        General: Pt is alert, awake, not in acute distress Cardiovascular: RRR, S1/S2 +, no rubs, no gallops Respiratory: CTA bilaterally, no wheezing, no rhonchi Abdominal: Soft, NT, ND, bowel sounds + Extremities: no edema, no cyanosis Skin: small incision on posterior aspect of neck noted- small amount of pus expressed     Discharge Instructions  Discharge  Instructions    Diet - low sodium heart healthy   Complete by:  As directed    Increase activity slowly   Complete by:  As directed      Allergies as of 07/14/2017      Reactions   Nsaids Other (See Comments)   LYMPHOCYTIC COLITIS   Soap Shortness Of Breath, Rash   IVORY SOAP   Adhesive [tape] Other (See Comments)   BLISTERS   Augmentin [amoxicillin-pot Clavulanate] Nausea And Vomiting   Has patient had a PCN reaction causing immediate rash, facial/tongue/throat swelling, SOB or lightheadedness with hypotension: Unknown Has patient had a PCN reaction causing severe rash involving mucus membranes or skin necrosis: Unknown Has patient had a PCN reaction that required hospitalization Unknown Has patient had a PCN reaction occurring within the last 10 years: Unknown If all of the above answers are "NO", then may proceed with Cephalosporin use.   Latex Itching, Other (See Comments)   BLISTERS   Phenergan [promethazine Hcl]    rash   Chlorhexidine Gluconate Rash   Pt says her skin looked like poison ivy   Cymbalta [duloxetine Hcl] Nausea Only   Mupirocin Rash   Oxycodone Nausea Only   ON EMPTY STOMACH      Medication List    STOP taking these medications   aspirin-acetaminophen-caffeine 250-250-65 MG tablet Commonly known as:  EXCEDRIN MIGRAINE     TAKE these medications   amitriptyline 25 MG tablet Commonly known as:  ELAVIL Take 25 mg by mouth at bedtime.   amLODipine 5 MG tablet Commonly known as:  NORVASC Take 5 mg by mouth daily.   atorvastatin 20 MG tablet Commonly known as:  LIPITOR Take 20 mg by mouth daily.   budesonide 3 MG 24 hr capsule Commonly known as:  ENTOCORT EC Take 3 capsules (9 mg total) by mouth daily.   cholecalciferol 1000 units tablet Commonly known as:  VITAMIN D Take 1,000 Units by mouth daily.   diazepam 5 MG tablet Commonly known as:  VALIUM Take 1 tablet (5 mg total) by mouth every 6 (six) hours as needed for muscle spasms.    letrozole 2.5 MG tablet Commonly known as:  FEMARA Take 1 tablet (2.5 mg total) by mouth daily.   losartan-hydrochlorothiazide 100-12.5 MG tablet Commonly known as:  HYZAAR Take 1 tablet by mouth daily.   MULTIVITAMIN ADULT PO Take 1 tablet by mouth daily.   ondansetron 4 MG tablet Commonly known as:  ZOFRAN Take 1 tablet (4 mg total) by mouth every 6 (six) hours as needed for nausea.   ondansetron 4 MG tablet Commonly known as:  ZOFRAN Take 1 tablet (4 mg total) by mouth daily as needed for nausea or vomiting.   oxyCODONE-acetaminophen 5-325 MG tablet Commonly known as:  PERCOCET/ROXICET Take 1-2 tablets by mouth every 4 (four) hours as needed for severe pain. What changed:  how  much to take   PROBIOTIC DAILY PO Take 1 capsule by mouth daily.   sulfamethoxazole-trimethoprim 800-160 MG tablet Commonly known as:  BACTRIM DS,SEPTRA DS Take 1 tablet by mouth every 12 (twelve) hours. What changed:  when to take this      Follow-up Information    Phylliss Bob, MD. Schedule an appointment as soon as possible for a visit in 2 week(s).   Specialty:  Orthopedic Surgery Contact information: [address] [place] [address] [phone]          Allergies  Allergen Reactions  . Nsaids Other (See Comments)    LYMPHOCYTIC COLITIS  . Soap Shortness Of Breath and Rash    IVORY SOAP  . Adhesive [Tape] Other (See Comments)    BLISTERS  . Augmentin [Amoxicillin-Pot Clavulanate] Nausea And Vomiting    Has patient had a PCN reaction causing immediate rash, facial/tongue/throat swelling, SOB or lightheadedness with hypotension: Unknown Has patient had a PCN reaction causing severe rash involving mucus membranes or skin necrosis: Unknown Has patient had a PCN reaction that required hospitalization Unknown Has patient had a PCN reaction occurring within the last 10 years: Unknown If all of the above answers are "NO", then may proceed with Cephalosporin use.    . Latex Itching and Other (See Comments)    BLISTERS  . Phenergan [Promethazine Hcl]     rash  . Chlorhexidine Gluconate Rash    Pt says her skin looked like poison ivy  . Cymbalta [Duloxetine Hcl] Nausea Only  . Mupirocin Rash  . Oxycodone Nausea Only    ON EMPTY STOMACH     Procedures/Studies:  PROCEDURES: 1. Posterior spinal fusion, L1-L2, L2-L3. 2. Posterior decompression, L1-L2, L2-L3. 3. Posterior segmental instrumentation, L1, L2, which was hooked up to     the patient's previous L3-L4 instrumentation construct. 4. Use of local autograft. 5. Use of morselized allograft - [employer]. 6. Intraoperative use of fluoroscopy.    Dg Lumbar Spine 2-3 Views  Result Date: 07/12/2017 CLINICAL DATA:  Lumbar decompression fusion EXAM: LUMBAR SPINE - 2-3 VIEW; DG C-ARM 61-120 MIN COMPARISON:  None. FINDINGS: Two intraoperative fluoroscopic images are provided during lumbar decompression and fusion at the L1-2 and L2-3 levels. IMPRESSION: Intraoperative fluoroscopy. Electronically Signed   By: Ulyses Jarred M.D.   On: 07/12/2017 19:36   Dg Lumbar Spine 2-3 Views  Result Date: 07/11/2017 CLINICAL DATA:  L1-2, L2-3 fusion EXAM: DG C-ARM 61-120 MIN; LUMBAR SPINE - 2-3 VIEW COMPARISON:  06/17/2017 FLUOROSCOPY TIME:  Fluoroscopy Time:  3 minutes 30 seconds Radiation Exposure Index (if provided by the fluoroscopic device): Not available Number of Acquired Spot Images: 2 FINDINGS: There again noted pedicle screws at L3 and L4 with interbody fusion. Subsequent interbody fusion at L2-3 and L1-2 is noted. Left-sided lateral fixation is noted with fixation screws extending into the vertebral body at L1 and L2. IMPRESSION: L1-2 and L2-3 fusion. Electronically Signed   By: Inez Catalina M.D.   On: 07/11/2017 17:01   Ct Soft Tissue Neck W Contrast  Result Date: 07/10/2017 CLINICAL DATA:  58 y/o  F; hypotension.  Cellulitis of neck. EXAM: CT NECK WITH CONTRAST TECHNIQUE: Multidetector CT imaging of the neck  was performed using the standard protocol following the bolus administration of intravenous contrast. CONTRAST:  100 cc Isovue 370 COMPARISON:  None. FINDINGS: Pharynx and larynx: Normal. No mass or swelling. Salivary glands: No inflammation, mass, or stone. Thyroid: Nodule in right lobe of thyroid measuring 22 mm. Lymph  nodes: None enlarged or abnormal density. Vascular: Mixed plaque of left carotid bifurcation with mild 50% proximal ICA stenosis. Several segments of fibrofatty plaque within common carotid arteries without high-grade stenosis. Limited intracranial: Negative. Visualized orbits: Negative. Mastoids and visualized paranasal sinuses: Mild ethmoid and maxillary sinus mucosal thickening. Normal aeration of mastoid air cells. Skeleton: Mild cervical spondylosis with multilevel disc and facet degenerative changes. Uncovertebral and facet hypertrophy results in multiple left-sided narrowing of bony neural foramen. Upper chest: Negative. Other: Multiloculated abscess centered within the posterior lower neck subcutaneous fat measuring 1.5 x 4.1 x 3.0 cm in conglomerate (AP x ML x CC series 1, image 39 and series 6, image 39). There is a sinus tract extending to the skin surface. Surrounding inflammation is present within subcutaneous fat. The abscess does not extend into the deep cervical soft tissues. IMPRESSION: Multiloculated abscess centered within posterior lower neck subcutaneous fat with sinus tract to skin surface measuring up to 4.1 cm. Electronically Signed   By: Kristine Garbe M.D.   On: 07/10/2017 18:27   Ct Lumbar Spine Wo Contrast  Result Date: 07/12/2017 CLINICAL DATA:  Initial evaluation for ongoing lower back pain, history of prior spinal fusion earlier the same day. EXAM: CT LUMBAR SPINE WITHOUT CONTRAST TECHNIQUE: Multidetector CT imaging of the lumbar spine was performed without intravenous contrast administration. Multiplanar CT image reconstructions were also generated.  COMPARISON:  Prior radiograph from earlier the same day as well as previous MRI from 07/02/2017. FINDINGS: Segmentation: Transitional lumbosacral anatomy. Same numbering system is employed as on previous exams. Alignment: Stable amount with mild levoscoliosis. Anterolisthesis at the previously fused L3-4 level is unchanged. Trace retrolisthesis of L2 on L3 also relatively similar. Vertebrae: Vertebral body heights maintained without evidence for acute or interval fracture. No discrete lytic or blastic osseous lesions. Reactive endplate changes present about the L2-3 and L4-5 interspaces. Patient status post PLIF at the L3-4 level. Hardware at this level appears well positioned and intact without periprosthetic lucency to suggest loosening or failure. Postoperative changes from recent left lateral and interbody fusion present at L1-2. Hardware appears well aligned intact, although the inferior screw at L2 closely approximates the superior endplate of L2. Interbody grafts have been placed in within the L1-2 and L2-3 interspaces and appear well positioned Paraspinal and other soft tissues: Mild edema with scattered foci of postoperative emphysema present within the psoas musculature bilaterally, right greater than left. No discrete collections. Aortic atherosclerosis noted. Visualized visceral structures within normal limits. Disc levels: T11-12: Mild diffuse disc bulging with intervertebral disc space narrowing. Superimposed left paracentral disc protrusion with annular calcification (series 5, image 20). No significant canal or foraminal stenosis. T12-L1: Mild diffuse disc bulge. No canal or foraminal stenosis. L1-2: Interval performance of left lateral and interbody fixation. Previously identified right subarticular and foraminal disc extrusion with cephalad migration again seen (series 9, image 28, 23). This is similar relative to recent MRI. Resultant moderate right lateral recess with severe right L1 foraminal  narrowing relatively similar. Superimposed right greater than left facet hypertrophy. Overall mild central canal stenosis. Left neural foramen remains patent. L2-3: Interval performance of interbody fusion. Scattered disc desiccation with vacuum disc phenomenon noted. Right eccentric disc osteophyte complex with superimposed bilateral bulky facet hypertrophy again seen. There is resultant moderate spinal stenosis with severe right lateral recess narrowing and severe right L2 foraminal stenosis. More moderate left L1 foraminal narrowing. L3-4:  Prior PLIF.  No residual stenosis. L4-5: Left eccentric disc bulge with intervertebral disc space narrowing.  Mild vacuum disc phenomenon. Moderate left greater than right facet and ligament flavum hypertrophy. Resultant mild to moderate left lateral recess stenosis with moderate left L4 foraminal narrowing. No significant right foraminal narrowing. Central canal remains patent. L5-S1: Transitional lumbosacral anatomy with partial sacralization of the L5 vertebral body. Partial ankylosis of the left-sided facets. Sclerotic left-sided assimilation joint. Epidural lipomatosis. No significant stenosis. IMPRESSION: 1. Postoperative changes from interval left lateral and interbody fusion at L1-2, with additional interbody fusion at L2-3. No complication. 2. Persistent moderate sized right subarticular and foraminal disc extrusion at L1-2 with resultant moderate right lateral recess with severe right L1 foraminal narrowing. 3. Right eccentric disc osteophyte with facet hypertrophy at L2-3 with resultant severe right lateral recess and right L2 foraminal stenosis. 4. Prior fusion at L3-4 without residual stenosis. 5. Left eccentric disc osteophyte with facet hypertrophy at L4-5 with resultant mild to moderate left lateral recess and left L4 foraminal narrowing. 6. Transitional lumbosacral anatomy. Electronically Signed   By: Jeannine Boga M.D.   On: 07/12/2017 00:26   Ct  Lumbar Spine Wo Contrast  Result Date: 06/17/2017 CLINICAL DATA:  Fall on Thursday.  Back pain.  Right hip pain. EXAM: CT LUMBAR SPINE WITHOUT CONTRAST TECHNIQUE: Multidetector CT imaging of the lumbar spine was performed without intravenous contrast administration. Multiplanar CT image reconstructions were also generated. COMPARISON:  None. FINDINGS: Segmentation: There are 6 non rib-bearing lumbar type vertebrae. Based on the chart, patient's fusion in 2014 was at L3-4. On chest x-ray 02/20/2016 only 11 paired ribs are present. Alignment: Levoscoliosis that would be better measured on standing radiographs Vertebrae: Postoperative changes below. No acute fracture, discitis, or aggressive bone lesion. Paraspinal and other soft tissues: Atherosclerotic calcification. Disc levels: T12- L1: Unremarkable. L1-L2: Disc narrowing with suspected large right paracentral and foraminal protrusion which would impinge on the L1 nerve root. L2-L3: Advanced asymmetric right-sided disc collapse and endplate degeneration. Bulky right-sided facet degenerative hypertrophy. Severe right foraminal impingement. High-grade spinal stenosis. L3-L4: PLIF. No visible arthrodesis between the bodies but there is solid posterolateral bone on the left. No hardware failure. No residual bony impingement. L4-L5: Advanced disc degeneration with asymmetric left-sided height loss and far-lateral spurring. Facet arthropathy with severe joint degeneration on the left. Patent canal and foramina L5-S1:Pseudoarticulation between the left transverse process and sacrum with hypertrophic change. No impingement. IMPRESSION: 1. 6 lumbar type vertebral bodies. Only 11 ribs are seen on 02/20/2016 chest x-ray and the T12 vertebra may be non rib-bearing. To match chart history of prior L3-4 surgery the lowest open disc space is numbered L5-S1. 2. L1-2 possible large right foraminal protrusion which would compress the right L1 nerve root. Consider MRI  characterization. 3. L2-3 severe right-sided disc degeneration and facet arthropathy. Severe right foraminal impingement. Spinal stenosis is high-grade. 4. L3-4 PLIF. Left posterior-lateral solid arthrodesis. No evidence of residual impingement. 5. Noncompressive degenerative changes above. Electronically Signed   By: Monte Fantasia M.D.   On: 06/17/2017 12:16   Mr Lumbar Spine Wo Contrast  Result Date: 07/02/2017 CLINICAL DATA:  57 year old female with severe pain radiating to the right hip and leg for 2 weeks status post fall off of a chair. Prior surgery. EXAM: MRI LUMBAR SPINE WITHOUT CONTRAST TECHNIQUE: Multiplanar, multisequence MR imaging of the lumbar spine was performed. No intravenous contrast was administered. COMPARISON:  Lumbar spine CT 06/17/2017. FINDINGS: Segmentation: Transitional spinal anatomy, same numbering system used on the lumbar spine CT designating absent ribs at T12 and prior surgery at L3-L4. This designates  left side assimilation joint at L5-S1. Correlation with radiographs is recommended prior to any operative intervention. Alignment: Stable from the CT earlier this month. Mild lumbar scoliosis. Mild spondylolisthesis at the previously fused level L3-L4. Mild retrolisthesis at L2-L3. Vertebrae: Mild hardware susceptibility artifact at L3-L4. No marrow edema or evidence of acute osseous abnormality. Visualized bone marrow signal is within normal limits. Intact visible sacrum and SI joints. Conus medullaris and cauda equina: Conus extends to the T12-L1 level. Normal visible lower thoracic spinal cord and conus. Paraspinal and other soft tissues: Mild postoperative changes to the posterior paraspinal soft tissues with no postoperative fluid collection. Negative visible abdominal viscera. Disc levels: Lower thoracic disc bulging, and suspected small disc protrusion at T11-T12, with no lower thoracic spinal or foraminal stenosis. T12-L1:  Negative. L1-L2: Confirmed right subarticular  and foraminal disc extrusion best seen on series 5, image 6. This is the cephalad disc extrusion with superimposed indistinct heterogeneous signal in the right L1 neural foramen tracking along the exiting right nerve (series 7, image 11) which may indicate epidural blood products associated with the disc disease. Severe right neural foraminal involvement. Mild to moderate right lateral recess involvement (descending right L2 nerve level). Superimposed disc bulge and right greater than left facet hypertrophy at this level. Mild overall spinal stenosis. No convincing left neural foraminal or lateral recess stenosis. L2-L3: Severe posterior disc space loss with vacuum disc here on the comparison. Rightward bulky disc osteophyte complex and moderate to severe facet hypertrophy greater on the right. Moderate spinal stenosis (series 7, image 17) with moderate to severe right neural foraminal stenosis and right lateral recess stenosis (exiting right L2 and descending right L3 nerve levels, respectively). Mild left L2 foraminal stenosis. L3-L4:  Prior decompression and fusion.  Widely patent thecal sac. L4-L5: Left eccentric and somewhat bulky disc osteophyte complex with severe left facet hypertrophy. Vacuum disc and left side vacuum facet here on the recent CT. No spinal stenosis. Mild to moderate left L4 neural foraminal stenosis and left lateral recess stenosis (left L5 nerve level). No right side stenosis. L5-S1: The L5 level appears partially sacralized, questionable ankylosis of the left facets. Sclerotic left L5-S1 assimilation joint. Epidural lipomatosis effaces CSF from the thecal sac at this level. No other stenosis at this level. IMPRESSION: 1. Transitional anatomy. Same numbering system used as on the lumbar spine CT designating absent ribs at T12, prior operative fusion of L3-L4, and left side assimilation joint at L5-S1. 2. Moderate to severe right side stenosis at both: - L1-L2 where a bulky right  subarticular and foraminal disc extrusion suspected on the CT earlier this month is confirmed, and may be accompanied by a mild degree of epidural hemorrhage. Severe right L1 neural foraminal stenosis and up to moderate right L2 nerve level lateral recess stenosis. - L2-L3 where severe adjacent segment disease is eccentric to the right and there is multifactorial moderate spinal stenosis with moderate to severe right L2 neural foraminal and right L3 nerve level lateral recess stenosis. 3. Left eccentric adjacent segment disease at L4-L5 with up to moderate left neural foraminal and left lateral recess stenosis. Electronically Signed   By: Genevie Ann M.D.   On: 07/02/2017 08:05   Dg Lumbar Spine 1 View  Result Date: 07/12/2017 CLINICAL DATA:  Intraoperative localization EXAM: LUMBAR SPINE - 1 VIEW COMPARISON:  07/11/2017 FINDINGS: Five lumbar type vertebral bodies are well visualized. Changes of prior fusion are noted at L1-2, L2-3 and L3-4. Lateral fixation on the left at  L1-2 is noted as well as pedicle screws bilaterally at L3-4. Needles are noted in the posterior soft tissues at the T12-L1 interspace as well as at the L2-3 interspace. IMPRESSION: Intraoperative localization at T12-L1 and L2-L3. Electronically Signed   By: Inez Catalina M.D.   On: 07/12/2017 16:11   Ct Hip Right Wo Contrast  Result Date: 06/17/2017 CLINICAL DATA:  [place] Thursday.  Persistent hip pain. EXAM: CT OF THE RIGHT HIP WITHOUT CONTRAST TECHNIQUE: Multidetector CT imaging of the right hip was performed according to the standard protocol. Multiplanar CT image reconstructions were also generated. COMPARISON:  Radiographs 06/17/2017 FINDINGS: The right hip is normally located. No hip fracture or significant degenerative changes. No evidence of AVN. The visualized right hemipelvis is intact. No definite pubic rami fractures. The surrounding hip and pelvic musculature are grossly normal by CT. No obvious intramuscular hematoma. No significant  intrapelvic abnormalities are identified. IMPRESSION: No definite acute hip or pelvic fractures. Electronically Signed   By: Marijo Sanes M.D.   On: 06/17/2017 12:13   Dg C-arm 1-60 Min  Result Date: 07/12/2017 CLINICAL DATA:  Lumbar decompression fusion EXAM: LUMBAR SPINE - 2-3 VIEW; DG C-ARM 61-120 MIN COMPARISON:  None. FINDINGS: Two intraoperative fluoroscopic images are provided during lumbar decompression and fusion at the L1-2 and L2-3 levels. IMPRESSION: Intraoperative fluoroscopy. Electronically Signed   By: Ulyses Jarred M.D.   On: 07/12/2017 19:36   Dg C-arm 1-60 Min  Result Date: 07/12/2017 CLINICAL DATA:  Lumbar decompression fusion EXAM: LUMBAR SPINE - 2-3 VIEW; DG C-ARM 61-120 MIN COMPARISON:  None. FINDINGS: Two intraoperative fluoroscopic images are provided during lumbar decompression and fusion at the L1-2 and L2-3 levels. IMPRESSION: Intraoperative fluoroscopy. Electronically Signed   By: Ulyses Jarred M.D.   On: 07/12/2017 19:36   Dg C-arm 1-60 Min  Result Date: 07/11/2017 CLINICAL DATA:  L1-2, L2-3 fusion EXAM: DG C-ARM 61-120 MIN; LUMBAR SPINE - 2-3 VIEW COMPARISON:  06/17/2017 FLUOROSCOPY TIME:  Fluoroscopy Time:  3 minutes 30 seconds Radiation Exposure Index (if provided by the fluoroscopic device): Not available Number of Acquired Spot Images: 2 FINDINGS: There again noted pedicle screws at L3 and L4 with interbody fusion. Subsequent interbody fusion at L2-3 and L1-2 is noted. Left-sided lateral fixation is noted with fixation screws extending into the vertebral body at L1 and L2. IMPRESSION: L1-2 and L2-3 fusion. Electronically Signed   By: Inez Catalina M.D.   On: 07/11/2017 17:01   Dg C-arm 1-60 Min  Result Date: 07/11/2017 CLINICAL DATA:  L1-2, L2-3 fusion EXAM: DG C-ARM 61-120 MIN; LUMBAR SPINE - 2-3 VIEW COMPARISON:  06/17/2017 FLUOROSCOPY TIME:  Fluoroscopy Time:  3 minutes 30 seconds Radiation Exposure Index (if provided by the fluoroscopic device): Not available Number  of Acquired Spot Images: 2 FINDINGS: There again noted pedicle screws at L3 and L4 with interbody fusion. Subsequent interbody fusion at L2-3 and L1-2 is noted. Left-sided lateral fixation is noted with fixation screws extending into the vertebral body at L1 and L2. IMPRESSION: L1-2 and L2-3 fusion. Electronically Signed   By: Inez Catalina M.D.   On: 07/11/2017 17:01   Dg C-arm 1-60 Min  Result Date: 07/11/2017 CLINICAL DATA:  L1-2, L2-3 fusion EXAM: DG C-ARM 61-120 MIN; LUMBAR SPINE - 2-3 VIEW COMPARISON:  06/17/2017 FLUOROSCOPY TIME:  Fluoroscopy Time:  3 minutes 30 seconds Radiation Exposure Index (if provided by the fluoroscopic device): Not available Number of Acquired Spot Images: 2 FINDINGS: There again noted pedicle screws at L3 and  L4 with interbody fusion. Subsequent interbody fusion at L2-3 and L1-2 is noted. Left-sided lateral fixation is noted with fixation screws extending into the vertebral body at L1 and L2. IMPRESSION: L1-2 and L2-3 fusion. Electronically Signed   By: Inez Catalina M.D.   On: 07/11/2017 17:01   Ct Angio Chest/abd/pel For Dissection W And/or Wo Contrast  Result Date: 07/10/2017 CLINICAL DATA:  Hypotension.  Neck cellulitis. EXAM: CT ANGIOGRAPHY CHEST, ABDOMEN AND PELVIS TECHNIQUE: Multidetector CT imaging through the chest, abdomen and pelvis was performed using the standard protocol during bolus administration of intravenous contrast. Multiplanar reconstructed images and MIPs were obtained and reviewed to evaluate the vascular anatomy. CONTRAST:  100 mL Isovue 370 COMPARISON:  CTs of the lumbar spine and right hip 06/17/2017 FINDINGS: CTA CHEST FINDINGS Cardiovascular: There is no evidence of thoracic aortic intramural hematoma on precontrast images. The thoracic aorta is normal in caliber without aneurysm or dissection. The heart is normal in size. There is lipomatous hypertrophy of the interatrial septum. There is no pericardial effusion. No central pulmonary emboli are  identified on this nondedicated study. Mediastinum/Nodes: Heterogeneity of the right greater than left thyroid lobes with assessment limited by streak artifact from venous contrast. No enlarged axillary or mediastinal lymph nodes. Subcentimeter hilar lymph nodes bilaterally, slightly more prominent on the right. Small sliding hiatal hernia. Lungs/Pleura: No pleural effusion or pneumothorax. Evaluation of the lung parenchyma is mildly limited by respiratory motion artifact. There is mild bronchial wall thickening bilaterally and minimal dependent atelectasis bilaterally. No mass. Musculoskeletal: No acute osseous abnormality or suspicious osseous lesion. Review of the MIP images confirms the above findings. CTA ABDOMEN AND PELVIS FINDINGS VASCULAR Aorta: Patent with mild diffuse atherosclerotic irregularity. No aneurysm or dissection. Celiac: Patent without evidence of aneurysm, dissection, vasculitis or significant stenosis. Accessory right hepatic artery arises from the left gastric artery, a normal variant. SMA: Patent with less than 50% narrowing proximally.  No dissection. Renals: Single renal arteries bilaterally are patent without evidence of dissection. 65% stenosis of the proximal right main renal artery. IMA: Patent without evidence of aneurysm, dissection, vasculitis or significant stenosis. Inflow: Patent with mild atherosclerosis bilaterally. No significant stenosis or dissection. Veins: No obvious venous abnormality within the limitations of this arterial phase study. Review of the MIP images confirms the above findings. NON-VASCULAR Hepatobiliary: No focal liver abnormality is seen. No gallstones, gallbladder wall thickening, or biliary dilatation. Pancreas: Unremarkable. Spleen: Unremarkable. Adrenals/Urinary Tract: Unremarkable adrenal glands. No hydronephrosis. Subcentimeter right renal hypodensity, too small to fully characterize. Unremarkable bladder. Stomach/Bowel: No bowel obstruction or gross  wall thickening. Appendix not clearly visualized, however no inflammatory changes are seen to suggest appendicitis. Lymphatic: No enlarged lymph nodes. Reproductive: Uterus and bilateral adnexa are unremarkable. Other: No intraperitoneal free fluid.  No abdominal wall hernia. Musculoskeletal: Prior lumbar fusion with advanced disc degeneration above and below the fused segment. Mild-to-moderate lumbar levoscoliosis. Review of the MIP images confirms the above findings. IMPRESSION: 1. No aortic aneurysm or dissection. Aortic Atherosclerosis (ICD10-I70.0). 2. 65% right renal artery stenosis. 3. Bronchial wall thickening. 4. Small hiatal hernia. Electronically Signed   By: Logan Bores M.D.   On: 07/10/2017 18:43   Dg Hip Unilat  With Pelvis 2-3 Views Right  Result Date: 06/17/2017 CLINICAL DATA:  Right hip pain and groin pain. EXAM: DG HIP (WITH OR WITHOUT PELVIS) 2-3V RIGHT COMPARISON:  None. FINDINGS: No fracture or dislocation identified. The bony pelvis is intact. There is mild osteoarthritis both hip joints which appear symmetric. No  bony lesions. Visible spinal fusion at the L3-4 level. IMPRESSION: No acute fracture or dislocation. Mild osteoarthritis of the right hip. Electronically Signed   By: Aletta Edouard M.D.   On: 06/17/2017 10:31     The results of significant diagnostics from this hospitalization (including imaging, microbiology, ancillary and laboratory) are listed below for reference.     Microbiology: Recent Results (from the past 240 hour(s))  Aerobic Culture (superficial specimen)     Status: None   Collection Time: 07/11/17 10:05 AM  Result Value Ref Range Status   Specimen Description NECK  Final   Special Requests Normal  Final   Gram Stain   Final    RARE WBC PRESENT, PREDOMINANTLY PMN RARE GRAM POSITIVE COCCI Performed at [facility], [address] [place], [place] [address]    Culture   Final    MODERATE METHICILLIN RESISTANT STAPHYLOCOCCUS AUREUS   Report  Status 07/13/2017 FINAL  Final   Organism ID, Bacteria METHICILLIN RESISTANT STAPHYLOCOCCUS AUREUS  Final      Susceptibility   Methicillin resistant staphylococcus aureus - MIC*    CIPROFLOXACIN >=8 RESISTANT Resistant     ERYTHROMYCIN >=8 RESISTANT Resistant     GENTAMICIN <=0.5 SENSITIVE Sensitive     OXACILLIN >=4 RESISTANT Resistant     TETRACYCLINE <=1 SENSITIVE Sensitive     VANCOMYCIN 1 SENSITIVE Sensitive     TRIMETH/SULFA <=10 SENSITIVE Sensitive     CLINDAMYCIN <=0.25 SENSITIVE Sensitive     RIFAMPIN <=0.5 SENSITIVE Sensitive     Inducible Clindamycin NEGATIVE Sensitive     * MODERATE METHICILLIN RESISTANT STAPHYLOCOCCUS AUREUS  Culture, blood (Routine X 2) w Reflex to ID Panel     Status: None (Preliminary result)   Collection Time: 07/11/17 10:50 AM  Result Value Ref Range Status   Specimen Description BLOOD RIGHT ARM  Final   Special Requests   Final    IN PEDIATRIC BOTTLE Blood Culture results may not be optimal due to an excessive volume of blood received in culture bottles   Culture   Final    NO GROWTH 2 DAYS Performed at [facility], [address] [place], [place] [address]    Report Status PENDING  Incomplete  Culture, blood (single)     Status: None (Preliminary result)   Collection Time: 07/11/17 10:55 AM  Result Value Ref Range Status   Specimen Description BLOOD RIGHT ARM  Final   Special Requests   Final    IN PEDIATRIC BOTTLE Blood Culture results may not be optimal due to an excessive volume of blood received in culture bottles   Culture   Final    NO GROWTH 2 DAYS Performed at [facility], [place] [address], [place], [place] [address]    Report Status PENDING  Incomplete  MRSA PCR Screening     Status: Abnormal   Collection Time: 07/11/17 12:12 PM  Result Value Ref Range Status   MRSA by PCR POSITIVE (A) NEGATIVE Final    Comment:        The GeneXpert MRSA Assay (FDA approved for NASAL specimens only), is one component of  a comprehensive MRSA colonization surveillance program. It is not intended to diagnose MRSA infection nor to guide or monitor treatment for MRSA infections. RESULT CALLED TO, READ BACK BY AND VERIFIED WITH: Thompson Caul RN 14:50 07/11/17 ([email or web address])      Labs: BNP (last 3 results) No results for input(s): BNP in the last 8760 hours. Basic  Metabolic Panel: Recent Labs  Lab 07/10/17 1120 07/12/17 0319  NA 132* 137  K 4.0 4.5  CL 96* 103  CO2 25 26  GLUCOSE 130* 132*  BUN 10 8  CREATININE 1.08* 0.97  CALCIUM 9.1 8.3*   Liver Function Tests: Recent Labs  Lab 07/10/17 1120  AST 31  ALT 43  ALKPHOS 105  BILITOT 0.3  PROT 6.0*  ALBUMIN 3.3*   No results for input(s): LIPASE, AMYLASE in the last 168 hours. No results for input(s): AMMONIA in the last 168 hours. CBC: Recent Labs  Lab 07/10/17 1120 07/12/17 0319  WBC 9.5 6.9  HGB 14.3 12.4  HCT 42.0 38.8  MCV 91.1 93.0  PLT 242 170   Cardiac Enzymes: No results for input(s): CKTOTAL, CKMB, CKMBINDEX, TROPONINI in the last 168 hours. BNP: Invalid input(s): POCBNP CBG: Recent Labs  Lab 07/11/17 0640 07/12/17 0633 07/14/17 0735  GLUCAP 142* 128* 97   D-Dimer No results for input(s): DDIMER in the last 72 hours. Hgb A1c No results for input(s): HGBA1C in the last 72 hours. Lipid Profile No results for input(s): CHOL, HDL, LDLCALC, TRIG, CHOLHDL, LDLDIRECT in the last 72 hours. Thyroid function studies No results for input(s): TSH, T4TOTAL, T3FREE, THYROIDAB in the last 72 hours.  Invalid input(s): FREET3 Anemia work up No results for input(s): VITAMINB12, FOLATE, FERRITIN, TIBC, IRON, RETICCTPCT in the last 72 hours. Urinalysis    Component Value Date/Time   COLORURINE YELLOW 02/20/2016 2221   APPEARANCEUR CLOUDY (A) 02/20/2016 2221   LABSPEC 1.018 02/20/2016 2221   PHURINE 5.5 02/20/2016 2221   GLUCOSEU NEGATIVE 02/20/2016 2221   HGBUR NEGATIVE 02/20/2016 2221   BILIRUBINUR NEGATIVE 02/20/2016 2221    KETONESUR NEGATIVE 02/20/2016 2221   PROTEINUR NEGATIVE 02/20/2016 2221   UROBILINOGEN 0.2 04/18/2013 0950   NITRITE NEGATIVE 02/20/2016 2221   LEUKOCYTESUR NEGATIVE 02/20/2016 2221   Sepsis Labs Invalid input(s): PROCALCITONIN,  WBC,  LACTICIDVEN Microbiology Recent Results (from the past 240 hour(s))  Aerobic Culture (superficial specimen)     Status: None   Collection Time: 07/11/17 10:05 AM  Result Value Ref Range Status   Specimen Description NECK  Final   Special Requests Normal  Final   Gram Stain   Final    RARE WBC PRESENT, PREDOMINANTLY PMN RARE GRAM POSITIVE COCCI Performed at [facility], [place] [address] [place], [place] [address]    Culture   Final    MODERATE METHICILLIN RESISTANT STAPHYLOCOCCUS AUREUS   Report Status 07/13/2017 FINAL  Final   Organism ID, Bacteria METHICILLIN RESISTANT STAPHYLOCOCCUS AUREUS  Final      Susceptibility   Methicillin resistant staphylococcus aureus - MIC*    CIPROFLOXACIN >=8 RESISTANT Resistant     ERYTHROMYCIN >=8 RESISTANT Resistant     GENTAMICIN <=0.5 SENSITIVE Sensitive     OXACILLIN >=4 RESISTANT Resistant     TETRACYCLINE <=1 SENSITIVE Sensitive     VANCOMYCIN 1 SENSITIVE Sensitive     TRIMETH/SULFA <=10 SENSITIVE Sensitive     CLINDAMYCIN <=0.25 SENSITIVE Sensitive     RIFAMPIN <=0.5 SENSITIVE Sensitive     Inducible Clindamycin NEGATIVE Sensitive     * MODERATE METHICILLIN RESISTANT STAPHYLOCOCCUS AUREUS  Culture, blood (Routine X 2) w Reflex to ID Panel     Status: None (Preliminary result)   Collection Time: 07/11/17 10:50 AM  Result Value Ref Range Status   Specimen Description BLOOD RIGHT ARM  Final   Special Requests   Final    IN PEDIATRIC  BOTTLE Blood Culture results may not be optimal due to an excessive volume of blood received in culture bottles   Culture   Final    NO GROWTH 2 DAYS Performed at [facility] [address] [place], [place] [address]    Report Status PENDING  Incomplete   Culture, blood (single)     Status: None (Preliminary result)   Collection Time: 07/11/17 10:55 AM  Result Value Ref Range Status   Specimen Description BLOOD RIGHT ARM  Final   Special Requests   Final    IN PEDIATRIC BOTTLE Blood Culture results may not be optimal due to an excessive volume of blood received in culture bottles   Culture   Final    NO GROWTH 2 DAYS Performed at [facility], [place] [address], [place], [place] [address]    Report Status PENDING  Incomplete  MRSA PCR Screening     Status: Abnormal   Collection Time: 07/11/17 12:12 PM  Result Value Ref Range Status   MRSA by PCR POSITIVE (A) NEGATIVE Final    Comment:        The GeneXpert MRSA Assay (FDA approved for NASAL specimens only), is one component of a comprehensive MRSA colonization surveillance program. It is not intended to diagnose MRSA infection nor to guide or monitor treatment for MRSA infections. RESULT CALLED TO, READ BACK BY AND VERIFIED WITH: Thompson Caul RN 14:50 07/11/17 ([email or web address])      Time coordinating discharge: Over 30 minutes  SIGNED:   Debbe Odea, MD  [facility] 07/14/2017, 11:54 AM Pager   If 7PM-7AM, please contact night-coverage [email or web address] Password [email or web address]

## 2017-07-14 NOTE — Progress Notes (Addendum)
Patient ready for discharge to home; discharge instructions given and reviewed; Rx's given; daughter present at bedside to teach wound care to the back of the neck; instructions on packing with the idoform gauze explained and measurements given; wound care and incision care reviewed. Patient discharged out via wheelchair accompanied home by her daughter. Home health services will contact the patient on Monday.

## 2017-07-14 NOTE — Evaluation (Signed)
Occupational Therapy Evaluation Patient Details Name: [name] MRN: [id number] DOB: December 04, 1960 Today's Date: 07/14/2017    History of Present Illness Pt is a 57 y.o. admitted 07/10/17 for evaluation of ongoing lower back pain and neck abscess. Now s/p a 2-part surgery: L1-3 lateral interbody fusion on 3/7, and L1-3 posterior decompression and fusion on 3/8. PMH includes lumbar fusion (04/2013), HTN, breast CA, arthritis, depression.   Clinical Impression   PTA Pt independent in ADL/IADL and mobility. Pt is currently mod I for ADL - please see ADL section below for more details. Back handout provided and reviewed adls in detail. Pt educated on: clothing between brace, never sleep in brace, set an alarm at night for medication, avoid sitting for long periods of time, correct bed positioning for sleeping, correct sequence for bed mobility, avoiding lifting more than 5 pounds and never wash directly over incision. All education is complete and patient indicates understanding. OT to sign off at this time. Thank you for the opportunity to serve this patient.     Follow Up Recommendations  No OT follow up;Supervision - Intermittent    Equipment Recommendations  Other (comment)(toilet aide if she chooses)    Recommendations for Other Services       Precautions / Restrictions Precautions Precautions: Back Precaution Booklet Issued: Yes (comment) Precaution Comments: reviewed in full with handout as reference Required Braces or Orthoses: Spinal Brace Spinal Brace: Thoracolumbosacral orthotic;Applied in sitting position Restrictions Weight Bearing Restrictions: No      Mobility Bed Mobility Overal bed mobility: Independent             General bed mobility comments: Indep with log roll technique. Did not require any education  Transfers Overall transfer level: Modified independent                    Balance Overall balance assessment: Mild deficits observed, not  formally tested                                         ADL either performed or assessed with clinical judgement   ADL Overall ADL's : Modified independent                                       General ADL Comments: Pt able to don/doff socks, sink level grooming, toilet transfer and peri care - educated on toilet aide for rear peri care.      Vision Patient Visual Report: No change from baseline       Perception     Praxis      Pertinent Vitals/Pain Pain Assessment: 0-10 Pain Score: 4  Pain Location: Drain site Pain Descriptors / Indicators: Tender Pain Intervention(s): Monitored during session;Repositioned     Hand Dominance Right   Extremity/Trunk Assessment Upper Extremity Assessment Upper Extremity Assessment: Overall WFL for tasks assessed   Lower Extremity Assessment Lower Extremity Assessment: Defer to PT evaluation   Cervical / Trunk Assessment Cervical / Trunk Assessment: Other exceptions Cervical / Trunk Exceptions: s/p sx   Communication Communication Communication: No difficulties   Cognition Arousal/Alertness: Awake/alert Behavior During Therapy: WFL for tasks assessed/performed Overall Cognitive Status: Within Functional Limits for tasks assessed  General Comments  Pt concerned about "knot" in the left inner thigh that burns all the way down to her toes.     Exercises     Shoulder Instructions      Home Living Family/patient expects to be discharged to:: Private residence Living Arrangements: Children Available Help at Discharge: Family;Available 24 hours/day Type of Home: House Home Access: Stairs to enter [employer] of Steps: 3 Entrance Stairs-Rails: Right Home Layout: One level     Bathroom Shower/Tub: Teacher, early years/pre: Handicapped height Bathroom Accessibility: Yes How Accessible: Accessible via walker Home  Equipment: [place] - 2 wheels;Cane - single point          Prior Functioning/Environment Level of Independence: Independent                 OT Problem List:        OT Treatment/Interventions:      OT Goals(Current goals can be found in the care plan section) Acute Rehab OT Goals Patient Stated Goal: to get home to her grandaughter OT Goal Formulation: With patient Time For Goal Achievement: 07/28/17 Potential to Achieve Goals: Good  OT Frequency:     Barriers to D/C:            Co-evaluation              AM-PAC PT "6 Clicks" Daily Activity     Outcome Measure Help from another person eating meals?: None Help from another person taking care of personal grooming?: None Help from another person toileting, which includes using toliet, bedpan, or urinal?: A Little Help from another person bathing (including washing, rinsing, drying)?: None Help from another person to put on and taking off regular upper body clothing?: None Help from another person to put on and taking off regular lower body clothing?: None 6 Click Score: 23   End of Session Equipment Utilized During Treatment: Rolling walker;Back brace Nurse Communication: Mobility status  Activity Tolerance: Patient tolerated treatment well Patient left: in bed;with call bell/phone within reach                   Time: 1329-1348 OT Time Calculation (min): 19 min Charges:  OT General Charges $OT Visit: 1 Visit OT Evaluation $OT Eval Moderate Complexity: 1 Mod G-Codes:     Hulda Humphrey OTR/L [phone]  Merri Ray Daiki Dicostanzo 07/14/2017, 2:37 PM

## 2017-07-16 LAB — CULTURE, BLOOD (ROUTINE X 2): Culture: NO GROWTH

## 2017-07-16 LAB — CULTURE, BLOOD (SINGLE): Culture: NO GROWTH

## 2017-07-27 ENCOUNTER — Encounter (HOSPITAL_COMMUNITY): Payer: Self-pay | Admitting: Orthopedic Surgery

## 2017-10-09 ENCOUNTER — Other Ambulatory Visit: Payer: Self-pay | Admitting: Orthopedic Surgery

## 2017-10-09 DIAGNOSIS — M5416 Radiculopathy, lumbar region: Secondary | ICD-10-CM

## 2017-10-16 ENCOUNTER — Ambulatory Visit
Admission: RE | Admit: 2017-10-16 | Discharge: 2017-10-16 | Disposition: A | Discharge status: Home / Self Care | Payer: Medicaid Other | Source: Ambulatory Visit | Attending: Orthopedic Surgery | Admitting: Orthopedic Surgery

## 2017-10-16 DIAGNOSIS — M5416 Radiculopathy, lumbar region: Secondary | ICD-10-CM

## 2017-10-23 ENCOUNTER — Ambulatory Visit: Payer: Medicaid Other | Attending: Orthopedic Surgery | Admitting: Physical Therapy

## 2017-10-23 ENCOUNTER — Other Ambulatory Visit: Payer: Self-pay

## 2017-10-23 ENCOUNTER — Encounter: Payer: Self-pay | Admitting: Physical Therapy

## 2017-10-23 DIAGNOSIS — M79604 Pain in right leg: Secondary | ICD-10-CM

## 2017-10-23 DIAGNOSIS — R262 Difficulty in walking, not elsewhere classified: Secondary | ICD-10-CM

## 2017-10-23 DIAGNOSIS — M6281 Muscle weakness (generalized): Secondary | ICD-10-CM | POA: Diagnosis present

## 2017-10-23 DIAGNOSIS — R29898 Other symptoms and signs involving the musculoskeletal system: Secondary | ICD-10-CM

## 2017-10-23 NOTE — Therapy (Signed)
[facility] [address]  [place], [place], [address] Phone: [phone]   Fax:  [phone]  Physical Therapy Evaluation  Patient Details  Name: [name] MRN: [id number] Date of Birth: June 13, 1960 Referring Provider: Phylliss Bob, MD   Encounter Date: 10/23/2017  PT End of Session - 10/23/17 1830    Visit Number  1    Number of Visits  8    Date for PT Re-Evaluation  12/18/17    Authorization Type  Medicaid    PT Start Time  2703    PT Stop Time  1700    PT Time Calculation (min)  44 min    Activity Tolerance  Patient limited by pain;Patient tolerated treatment well    Behavior During Therapy  [facility] for tasks assessed/performed       Past Medical History:  Diagnosis Date  . Anxiety   . Arthritis    lower back  . Breast cancer ([place])   . Breast cancer of upper-outer quadrant of left female breast ([place]) 01/03/2016  . Complication of anesthesia    hx. of combativeness as child; had severe migraine headache day after back surgery  . Cough due to ACE inhibitor 01/28/2016  . Depression   . Eczema    right hip  . High cholesterol   . History of radiation therapy 05/29/16- 07/14/16   Left Breast/ 50.4 Gy n 25 fractions, Left Breast Boost/ 10 Gy in 5 fractions.   . Hypertension    states under control with meds., has been on med. since age 75  . Lymphocytic colitis   . Migraines   . Personal history of radiation therapy   . PONV (postoperative nausea and vomiting)   . Sciatica   . Sinus drainage 01/28/2016  . Urinary urgency     Past Surgical History:  Procedure Laterality Date  . ANTERIOR LAT LUMBAR FUSION Left 07/11/2017   Procedure: LEFT EXTREME LATERAL INTERBODY FUSION AND INSTRUMENTATION AND ALLOGRAFT LUMBAR 1-2/2-3;  Surgeon: Phylliss Bob, MD;  Location: [place];  Service: Orthopedics;  Laterality: Left;  . BREAST BIOPSY    . BREAST LUMPECTOMY     left 2017  . BREAST LUMPECTOMY WITH NEEDLE  LOCALIZATION AND AXILLARY SENTINEL LYMPH NODE BX Left 02/04/2016   Procedure: LEFT BREAST LUMPECTOMY WITH 2 NEEDLE LOCALIZATIONS AND LEFT AXILLARY SENTINEL LYMPH NODE BIOPSY;  Surgeon: [name], MD;  Location: [place];  Service: General;  Laterality: Left;  LEFT BREAST LUMPECTOMY WITH 2 NEEDLE LOCALIZATIONS AND LEFT AXILLARY SENTINEL LYMPH NODE BIOPSY  . [place]  . COLONOSCOPY     Hx: of  . CYSTO  09/29/2005   with anterior repair  . ENDOMETRIAL ABLATION  09/29/2005  . INCISION AND DRAINAGE ABSCESS Left 02/21/2016   Procedure: INCISION AND DRAINAGE ABSCESS;  Surgeon: Excell Seltzer, MD;  [facility];  Service: General;  Laterality: Left;  . INCONTINENCE SURGERY  09/29/2005  . KNEE ARTHROSCOPY W/ DEBRIDEMENT    . LUMBAR FUSION  04/28/2013   L3-4  . TONSILLECTOMY     age 60  . TUBAL LIGATION    . WISDOM TOOTH EXTRACTION     as a teenager    There were no vitals filed for this visit.   Subjective Assessment - 10/23/17 1620    Subjective  Patient reports she had 2-part procedure of lumbar fusion of L1-3 on 07/12/17-07/13/17. Reports that LBP is better since then, but R LE pain  remains. Current symptoms include: R LE pain wrapping around groin, down inner thigh and to 2 lateral toes; reports burning, numbness, tingling. Reports intermittent R LE muscle spasms when stretching leg out straight. Pain is constant; no difference in AM or PM. Sleeps on her side with pillow between legs; unable to sleep well. Reports MD has lifted lumbar fusion restrictions at her follow-up appointment. Had hx of cervical abscess with MRSA infection- patient reports it is healed. Patient also reports she has hx of stage IA breast cancer; underwent radiation and was clear on last mammogram. Reports MD would like her to try TENS for pain.    Pertinent History  hx of breast CA in remission, L3-4 fusion 2014, hx of neck abcess with MRSA, HTN    Limitations   Sitting;Standing;Walking    How long can you sit comfortably?  0 minutes    How long can you stand comfortably?  0 minutes    How long can you walk comfortably?  0 minutes    Diagnostic tests  10/17/17 lumbar MRI: persistent stenosis L2-3, improved appearance L1-2    Patient Stated Goals  not to feel pain    Currently in Pain?  Yes    Pain Score  7     Pain Location  Groin    Pain Orientation  Right    Pain Descriptors / Indicators  Tingling;Burning;Numbness    Pain Type  Chronic pain    Pain Radiating Towards  groin radiating to toes    Pain Onset  More than a month ago    Aggravating Factors   walking, climbing stairs    Pain Relieving Factors  ice, pain meds         [facility] PT Assessment - 10/23/17 1630      Assessment   Medical Diagnosis  L1-3 PSF    Referring Provider  Phylliss Bob, MD    Onset Date/Surgical Date  07/12/17    Next MD Visit  11/05/17    Prior Therapy  Yes- for previous lumbar fusion      Precautions   Precautions  None      Restrictions   Weight Bearing Restrictions  No      Balance Screen   Has the patient fallen in the past 6 months  Yes    How many times?  1 sitting and leaned over to grab something, fell- herniated d    Has the patient had a decrease in activity level because of a fear of falling?   No    Is the patient reluctant to leave their home because of a fear of falling?   No      Home Environment   Living Environment  Private residence    Living Arrangements  Children    Available Help at Discharge  Family    Type of [place] to enter    Entrance Stairs-Number of Steps  [place]  One level    [place] - 2 wheels;Crutches      Prior Function   Level of [place]  Unemployed    Leisure  hiking, canoe, bike      Cognition   Overall Cognitive Status  Within Functional Limits for tasks assessed      Sensation   Light Touch   Appears Intact reports decreased sensation  Coordination   Gross Motor Movements are Fluid and Coordinated  No limited by pain      Posture/Postural Control   Posture/Postural Control  Postural limitations    Postural Limitations  Rounded Shoulders;Forward head;Weight shift left unable to stand upright in standing      ROM / Strength   AROM / PROM / Strength  Strength;AROM      AROM   AROM Assessment Site  Lumbar constant pain- unchanged with AROM    Lumbar Flexion  WFL    Lumbar Extension  WFL    Lumbar - Right Side Bend  jt line    Lumbar - Left Side Bend  jt line    Lumbar - Right Rotation  WFL    Lumbar - Left Rotation  [facility]      Strength   Strength Assessment Site  Hip;Knee;Ankle    Right/Left Hip  Right;Left    Right Hip Flexion  3+/5    Right Hip ABduction  4/5    Right Hip ADduction  4/5    Left Hip Flexion  4/5    Left Hip ABduction  4/5    Left Hip ADduction  4/5    Right/Left Knee  Right;Left    Right Knee Flexion  4/5    Right Knee Extension  3/5    Left Knee Flexion  4/5    Left Knee Extension  4/5    Right/Left Ankle  Right;Left    Right Ankle Dorsiflexion  3+/5    Right Ankle Plantar Flexion  3+/5    Left Ankle Dorsiflexion  4+/5    Left Ankle Plantar Flexion  4+/5      Ambulation/Gait   Assistive device  None    Gait Pattern  Antalgic;Decreased stance time - right;Decreased step length - left;Trunk flexed    Ambulation Surface  Level;Indoor    Gait velocity  decreased                Objective measurements completed on examination: See above findings.              PT Education - 10/23/17 1829    Education Details  prognosis, POC, HEP    Person(s) Educated  Patient    Methods  Explanation;Demonstration;Tactile cues;Handout;Verbal cues    Comprehension  Verbalized understanding;Returned demonstration       PT Short Term Goals - 10/23/17 1840      PT SHORT TERM GOAL #1   Title  Patient to be independent with initial HEP.     Time  2    Period  Weeks    Status  New    Target Date  11/06/17        PT Long Term Goals - 10/23/17 1840      PT LONG TERM GOAL #1   Title  Patient to be independent with advanced HEP.    Time  8    Period  Weeks    Status  New    Target Date  12/18/17      PT LONG TERM GOAL #2   Title  Patient to demonstrate >=4+/5 strength in B LEs.    Time  8    Period  Weeks    Status  New    Target Date  12/18/17      PT LONG TERM GOAL #3   Title  Patient to report tolerance of 30 min of standing without pain.    Time  8    Period  Weeks    Status  New    Target Date  12/18/17      PT LONG TERM GOAL #4   Title  Patient to report tolerance of supine positioning for 2 hours to improve sleep tolerance.     Time  8    Period  Weeks    Status  New    Target Date  12/18/17      PT LONG TERM GOAL #5   Title  Patient to demonstrate step-through gait pattern with 1 handrail and no evidence of instability up 13 steps.     Time  8    Period  Weeks    Status  New    Target Date  12/18/17             Plan - 10/23/17 1830    Clinical Impression Statement  Patient is a 57y/o F with hx of breast CA- in remission and L3-4 spinal fusion, presenting with significant R LE pain s/p posterior L1-3 fusion 07/12/17-07/13/17. Reports improvement in LBP since surgery but R LE pain remains unchanged. Reports pain is constant and unchanging no matter the activity, does not change throughout the day, and disturbs sleep. Patient reports N/T and burning from R groin, to medial thigh, and to digits 4&5. Patient today with significant weakness in R LE and grossly WFL lumbar ROM- pain constant and severe; no worse with ROM. Reports relief with hamstring and adductor stretching. Educated patient on HEP including gentle stretching and strengthening to tolerance. Patient with tendency to push herself into extreme pain; advised against this. Patient reported understanding. Will benefit from skilled PT  services to address strength deficits, functional activity tolerance, gait deficits, and pain management.     Clinical Presentation  Stable    Clinical Decision Making  Low    Rehab Potential  Good    PT Frequency  1x / week    PT Duration  8 weeks    PT Treatment/Interventions  ADLs/Self Care Home Management;Cryotherapy;Electrical Stimulation;Moist Heat;Gait training;Stair training;Functional mobility training;Therapeutic activities;Therapeutic exercise;Manual techniques;Patient/family education;Neuromuscular re-education;Balance training;Scar mobilization;Passive range of motion;Dry needling;Energy conservation;Splinting;Taping;Vasopneumatic Device    PT Next Visit Plan  e-stim, sciatic nerve glides, STM    Consulted and Agree with Plan of Care  Patient       Patient will benefit from skilled therapeutic intervention in order to improve the following deficits and impairments:  Decreased endurance, Decreased scar mobility, Pain, Decreased strength, Decreased activity tolerance, Decreased balance, Decreased mobility, Difficulty walking, Improper body mechanics, Decreased range of motion, Impaired flexibility, Postural dysfunction  Visit Diagnosis: Pain in right leg  Muscle weakness (generalized)  Difficulty in walking, not elsewhere classified  Other symptoms and signs involving the musculoskeletal system     Problem List Patient Active Problem List   Diagnosis Date Noted  . Neck abscess 07/10/2017  . Hypotension 07/10/2017  . Hyponatremia 07/10/2017  . FH: colon cancer 04/04/2017  . Cellulitis of breast   . Septic shock ([place])   . Breast cancer of upper-outer quadrant of left female breast ([place]) 01/03/2016  . 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 Spondylolisthesis of lumbar region 04/28/2013    Class: Chronic  . Dehydration 08/09/2011  . Hypertension 08/09/2011  . Hyperlipidemia LDL goal < 100 08/09/2011  . Lymphocytic  colitis 08/09/2011  . TIA (transient ischemic attack) 08/09/2011  . ANXIETY 02/24/2010  . DEPRESSION 02/24/2010  . FLATULENCE-GAS-BLOATING 02/07/2008  . OTH&UNSPEC NONINFECTIOUS GASTROENTERITIS&COLITIS 12/12/2007  .  DIARRHEA 10/10/2007  . ABDOMINAL PAIN, GENERALIZED 10/10/2007     Janene Harvey, PT, DPT 10/23/17 6:44 PM   [facility] [address]  [place], [place], [address] Phone: [phone]   Fax:  [phone]  Name: [name] MRN: [id number] Date of Birth: [birth year]

## 2017-10-31 ENCOUNTER — Ambulatory Visit: Payer: Medicaid Other

## 2017-10-31 DIAGNOSIS — R262 Difficulty in walking, not elsewhere classified: Secondary | ICD-10-CM

## 2017-10-31 DIAGNOSIS — R29898 Other symptoms and signs involving the musculoskeletal system: Secondary | ICD-10-CM

## 2017-10-31 DIAGNOSIS — M79604 Pain in right leg: Secondary | ICD-10-CM

## 2017-10-31 DIAGNOSIS — M6281 Muscle weakness (generalized): Secondary | ICD-10-CM

## 2017-10-31 NOTE — Therapy (Signed)
[facility] [address]  [place], [place], [address] Phone: [phone]   Fax:  [phone]  Physical Therapy Treatment  Patient Details  Name: [name] MRN: [id number] Date of Birth: June 21, 1960 Referring Provider: Phylliss Bob, MD   Encounter Date: 10/31/2017  PT End of Session - 10/31/17 1705    Visit Number  2    Number of Visits  8    Date for PT Re-Evaluation  12/18/17    Authorization Type  Medicaid    PT Start Time  1700    PT Stop Time  6546    PT Time Calculation (min)  55 min    Activity Tolerance  Patient limited by pain    Behavior During Therapy  [facility] for tasks assessed/performed       Past Medical History:  Diagnosis Date  . Anxiety   . Arthritis    lower back  . Breast cancer ([place])   . Breast cancer of upper-outer quadrant of left female breast ([place]) 01/03/2016  . Complication of anesthesia    hx. of combativeness as child; had severe migraine headache day after back surgery  . Cough due to ACE inhibitor 01/28/2016  . Depression   . Eczema    right hip  . High cholesterol   . History of radiation therapy 05/29/16- 07/14/16   Left Breast/ 50.4 Gy n 25 fractions, Left Breast Boost/ 10 Gy in 5 fractions.   . Hypertension    states under control with meds., has been on med. since age 16  . Lymphocytic colitis   . Migraines   . Personal history of radiation therapy   . PONV (postoperative nausea and vomiting)   . Sciatica   . Sinus drainage 01/28/2016  . Urinary urgency     Past Surgical History:  Procedure Laterality Date  . ANTERIOR LAT LUMBAR FUSION Left 07/11/2017   Procedure: LEFT EXTREME LATERAL INTERBODY FUSION AND INSTRUMENTATION AND ALLOGRAFT LUMBAR 1-2/2-3;  Surgeon: Phylliss Bob, MD;  Location: [place];  Service: Orthopedics;  Laterality: Left;  . BREAST BIOPSY    . BREAST LUMPECTOMY     left 2017  . BREAST LUMPECTOMY WITH NEEDLE LOCALIZATION AND AXILLARY SENTINEL LYMPH  NODE BX Left 02/04/2016   Procedure: LEFT BREAST LUMPECTOMY WITH 2 NEEDLE LOCALIZATIONS AND LEFT AXILLARY SENTINEL LYMPH NODE BIOPSY;  Surgeon: [name], MD;  Location: [place] [facility];  Service: General;  Laterality: Left;  LEFT BREAST LUMPECTOMY WITH 2 NEEDLE LOCALIZATIONS AND LEFT AXILLARY SENTINEL LYMPH NODE BIOPSY  . [place]  . COLONOSCOPY     Hx: of  . CYSTO  09/29/2005   with anterior repair  . ENDOMETRIAL ABLATION  09/29/2005  . INCISION AND DRAINAGE ABSCESS Left 02/21/2016   Procedure: INCISION AND DRAINAGE ABSCESS;  Surgeon: Excell Seltzer, MD;  [facility];  Service: General;  Laterality: Left;  . INCONTINENCE SURGERY  09/29/2005  . KNEE ARTHROSCOPY W/ DEBRIDEMENT    . LUMBAR FUSION  04/28/2013   L3-4  . TONSILLECTOMY     age 2  . TUBAL LIGATION    . WISDOM TOOTH EXTRACTION     as a teenager    There were no vitals filed for this visit.  Subjective Assessment - 10/31/17 1706    Subjective  reports she has been performing HEP without issue.      Pertinent History  hx of breast CA in remission, L3-4 fusion 2014, hx of neck  abcess with MRSA, HTN    Diagnostic tests  10/17/17 lumbar MRI: persistent stenosis L2-3, improved appearance L1-2    Patient Stated Goals  not to feel pain    Currently in Pain?  Yes    Pain Score  7     Pain Location  Groin    Pain Orientation  Right    Pain Descriptors / Indicators  Tingling;Burning    Pain Type  Chronic pain    Pain Radiating Towards  Groin radiating to toes     Pain Onset  More than a month ago    Multiple Pain Sites  No                       [facility] Adult PT Treatment/Exercise - 10/31/17 1709      Lumbar Exercises: Stretches   Passive Hamstring Stretch  Right;Left;2 reps;30 seconds    Passive Hamstring Stretch Limitations  B seated     Piriformis Stretch  Right;Left;30 seconds;2 reps    Piriformis Stretch Limitations  towel assistance     Other Lumbar Stretch  Exercise  "Butterfly" groin stretch 2 x 30 sec       Lumbar Exercises: Aerobic   Nustep  Lvl 1, 6 min       Lumbar Exercises: Supine   Bridge  10 reps;3 seconds      Modalities   Modalities  Moist Heat;Electrical Stimulation      Moist Heat Therapy   Number Minutes Moist Heat  15 Minutes    Moist Heat Location  Hip R      Manual Therapy   Manual Therapy  Soft tissue mobilization;Myofascial release    Manual therapy comments  sidelying with LE elevated on bolster     Soft tissue mobilization  STM to mid thoracic paraspinals in area of tenderness; STM to R glute/piriformis     Myofascial Release  TPR to R piri                PT Short Term Goals - 10/31/17 1705      PT SHORT TERM GOAL #1   Title  Patient to be independent with initial HEP.    Time  2    Period  Weeks    Status  On-going        PT Long Term Goals - 10/31/17 1705      PT LONG TERM GOAL #1   Title  Patient to be independent with advanced HEP.    Time  8    Period  Weeks    Status  On-going      PT LONG TERM GOAL #2   Title  Patient to demonstrate >=4+/5 strength in B LEs.    Time  8    Period  Weeks    Status  On-going      PT LONG TERM GOAL #3   Title  Patient to report tolerance of 30 min of standing without pain.    Time  8    Period  Weeks    Status  On-going      PT LONG TERM GOAL #4   Title  Patient to report tolerance of supine positioning for 2 hours to improve sleep tolerance.     Time  8    Period  Weeks    Status  On-going      PT LONG TERM GOAL #5   Title  Patient to demonstrate step-through gait pattern with 1 handrail  and no evidence of instability up 13 steps.     Time  8    Period  Weeks    Status  On-going            Plan - 10/31/17 1715    Clinical Impression Statement  [name] reporting no issues with HEP however notes, "I'm hurting today".  Pt. increased buttocks/groin pain limited therex today however responded well to manual STM/TPR.  Session focused on  reviewing pt. technique with HEP which required only minor correction, and pain relief.  Pt. ended session with moist heat/E-stim combo to R buttocks musculature to promote reduction in pain and tone.  Will monitor response at upcoming visit.      PT Treatment/Interventions  ADLs/Self Care Home Management;Cryotherapy;Electrical Stimulation;Moist Heat;Gait training;Stair training;Functional mobility training;Therapeutic activities;Therapeutic exercise;Manual techniques;Patient/family education;Neuromuscular re-education;Balance training;Scar mobilization;Passive range of motion;Dry needling;Energy conservation;Splinting;Taping;Vasopneumatic Device    Consulted and Agree with Plan of Care  Patient       Patient will benefit from skilled therapeutic intervention in order to improve the following deficits and impairments:  Decreased endurance, Decreased scar mobility, Pain, Decreased strength, Decreased activity tolerance, Decreased balance, Decreased mobility, Difficulty walking, Improper body mechanics, Decreased range of motion, Impaired flexibility, Postural dysfunction  Visit Diagnosis: Pain in right leg  Muscle weakness (generalized)  Difficulty in walking, not elsewhere classified  Other symptoms and signs involving the musculoskeletal system     Problem List Patient Active Problem List   Diagnosis Date Noted  . Neck abscess 07/10/2017  . Hypotension 07/10/2017  . Hyponatremia 07/10/2017  . FH: colon cancer 04/04/2017  . Cellulitis of breast   . Septic shock ([place])   . Breast cancer of upper-outer quadrant of left female breast ([place]) 01/03/2016  . 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 Spondylolisthesis of lumbar region 04/28/2013    Class: Chronic  . Dehydration 08/09/2011  . Hypertension 08/09/2011  . Hyperlipidemia LDL goal < 100 08/09/2011  . Lymphocytic colitis 08/09/2011  . TIA (transient ischemic attack) 08/09/2011   . ANXIETY 02/24/2010  . DEPRESSION 02/24/2010  . FLATULENCE-GAS-BLOATING 02/07/2008  . OTH&UNSPEC NONINFECTIOUS GASTROENTERITIS&COLITIS 12/12/2007  . DIARRHEA 10/10/2007  . ABDOMINAL PAIN, GENERALIZED 10/10/2007    Bess Harvest, PTA 10/31/17 5:54 PM   [facility] [address]  [place], [place], [address] Phone: [phone]   Fax:  [phone]  Name: [name] MRN: [id number] Date of Birth: [birth year]

## 2017-11-05 ENCOUNTER — Other Ambulatory Visit: Payer: Self-pay | Admitting: Orthopedic Surgery

## 2017-11-05 DIAGNOSIS — M5416 Radiculopathy, lumbar region: Secondary | ICD-10-CM

## 2017-11-06 ENCOUNTER — Ambulatory Visit: Payer: Medicaid Other | Attending: Orthopedic Surgery

## 2017-11-06 DIAGNOSIS — R29898 Other symptoms and signs involving the musculoskeletal system: Secondary | ICD-10-CM | POA: Insufficient documentation

## 2017-11-06 DIAGNOSIS — M6281 Muscle weakness (generalized): Secondary | ICD-10-CM | POA: Insufficient documentation

## 2017-11-06 DIAGNOSIS — R262 Difficulty in walking, not elsewhere classified: Secondary | ICD-10-CM | POA: Diagnosis present

## 2017-11-06 DIAGNOSIS — M79604 Pain in right leg: Secondary | ICD-10-CM | POA: Diagnosis not present

## 2017-11-06 NOTE — Therapy (Signed)
[facility] [address]  [place], [place], [address] Phone: [phone]   Fax:  [phone]  Physical Therapy Treatment  Patient Details  Name: [name] MRN: [id number] Date of Birth: 08/03/1960 Referring Provider: Phylliss Bob, MD   Encounter Date: 11/06/2017  PT End of Session - 11/06/17 1717    Visit Number  3    Number of Visits  8    Date for PT Re-Evaluation  12/18/17    Authorization Type  Medicaid    Authorization Time Period  6.26.19 - 7.16.19    Authorization - Visit Number  2    Authorization - Number of Visits  3    PT Start Time  1701    PT Stop Time  6962    PT Time Calculation (min)  57 min    Activity Tolerance  Patient limited by pain    Behavior During Therapy  [facility] for tasks assessed/performed       Past Medical History:  Diagnosis Date  . Anxiety   . Arthritis    lower back  . Breast cancer ([place])   . Breast cancer of upper-outer quadrant of left female breast ([place]) 01/03/2016  . Complication of anesthesia    hx. of combativeness as child; had severe migraine headache day after back surgery  . Cough due to ACE inhibitor 01/28/2016  . Depression   . Eczema    right hip  . High cholesterol   . History of radiation therapy 05/29/16- 07/14/16   Left Breast/ 50.4 Gy n 25 fractions, Left Breast Boost/ 10 Gy in 5 fractions.   . Hypertension    states under control with meds., has been on med. since age 68  . Lymphocytic colitis   . Migraines   . Personal history of radiation therapy   . PONV (postoperative nausea and vomiting)   . Sciatica   . Sinus drainage 01/28/2016  . Urinary urgency     Past Surgical History:  Procedure Laterality Date  . ANTERIOR LAT LUMBAR FUSION Left 07/11/2017   Procedure: LEFT EXTREME LATERAL INTERBODY FUSION AND INSTRUMENTATION AND ALLOGRAFT LUMBAR 1-2/2-3;  Surgeon: Phylliss Bob, MD;  Location: [place];  Service: Orthopedics;  Laterality: Left;  .  BREAST BIOPSY    . BREAST LUMPECTOMY     left 2017  . BREAST LUMPECTOMY WITH NEEDLE LOCALIZATION AND AXILLARY SENTINEL LYMPH NODE BX Left 02/04/2016   Procedure: LEFT BREAST LUMPECTOMY WITH 2 NEEDLE LOCALIZATIONS AND LEFT AXILLARY SENTINEL LYMPH NODE BIOPSY;  Surgeon: [name], MD;  Location: [facility];  Service: General;  Laterality: Left;  LEFT BREAST LUMPECTOMY WITH 2 NEEDLE LOCALIZATIONS AND LEFT AXILLARY SENTINEL LYMPH NODE BIOPSY  . [place]  . COLONOSCOPY     Hx: of  . CYSTO  09/29/2005   with anterior repair  . ENDOMETRIAL ABLATION  09/29/2005  . INCISION AND DRAINAGE ABSCESS Left 02/21/2016   Procedure: INCISION AND DRAINAGE ABSCESS;  Surgeon: Excell Seltzer, MD;  Location: [facility];  Service: General;  Laterality: Left;  . INCONTINENCE SURGERY  09/29/2005  . KNEE ARTHROSCOPY W/ DEBRIDEMENT    . LUMBAR FUSION  04/28/2013   L3-4  . TONSILLECTOMY     age 67  . TUBAL LIGATION    . WISDOM TOOTH EXTRACTION     as a teenager    There were no vitals filed for this visit.  Subjective Assessment - 11/06/17 1709    Subjective  Pt. noting Injection scheduled for hip for hip 7.10.19.      Pertinent History  hx of breast CA in remission, L3-4 fusion 2014, hx of neck abcess with MRSA, HTN    How long can you sit comfortably?  1 hours     How long can you stand comfortably?  30 min     How long can you walk comfortably?  30 min     Diagnostic tests  10/17/17 lumbar MRI: persistent stenosis L2-3, improved appearance L1-2    Patient Stated Goals  not to feel pain    Currently in Pain?  Yes    Pain Score  7     Pain Location  Groin    Pain Orientation  Right    Pain Descriptors / Indicators  Tingling;Burning    Pain Radiating Towards  radiating into R buttocks     Pain Onset  More than a month ago    Pain Frequency  Intermittent    Aggravating Factors   walking, slimbing stairs     Pain Relieving Factors  ice, pain meds     Multiple Pain  Sites  No                       [facility] Adult PT Treatment/Exercise - 11/06/17 1734      Lumbar Exercises: Stretches   Piriformis Stretch  Right;30 seconds;3 reps    Piriformis Stretch Limitations  towel assistance     Other Lumbar Stretch Exercise  "Butterfly" groin stretch 2 x 30 sec       Lumbar Exercises: Aerobic   Nustep  Lvl 2, 7 min       Lumbar Exercises: Supine   Bridge with clamshell  10 reps;3 seconds limited motion; hip abd/ER isometrics into red TB at knees     Other Supine Lumbar Exercises  Hooklying adduction ball squeeze 5" x 10 reps       Moist Heat Therapy   Number Minutes Moist Heat  10 Minutes    Moist Heat Location  Hip R buttocks      Manual Therapy   Manual Therapy  Soft tissue mobilization;Myofascial release    Manual therapy comments  sidelying with LE elevated on bolster     Soft tissue mobilization  STM to R glute/piriformis, R lateral quad, R lateral HS     Myofascial Release  TPR to R piri, TPR R lateral mid quad; very ttp              PT Education - 11/06/17 1755    Education Details  HEP update     Person(s) Educated  Patient    Methods  Explanation;Demonstration;Verbal cues;Handout    Comprehension  Verbalized understanding;Returned demonstration;Verbal cues required;Need further instruction       PT Short Term Goals - 11/06/17 1805      PT SHORT TERM GOAL #1   Title  Patient to be independent with initial HEP.    Time  2    Period  Weeks    Status  Achieved        PT Long Term Goals - 10/31/17 1705      PT LONG TERM GOAL #1   Title  Patient to be independent with advanced HEP.    Time  8    Period  Weeks    Status  On-going      PT LONG TERM GOAL #2   Title  Patient to demonstrate >=4+/5 strength  in B LEs.    Time  8    Period  Weeks    Status  On-going      PT LONG TERM GOAL #3   Title  Patient to report tolerance of 30 min of standing without pain.    Time  8    Period  Weeks    Status  On-going       PT LONG TERM GOAL #4   Title  Patient to report tolerance of supine positioning for 2 hours to improve sleep tolerance.     Time  8    Period  Weeks    Status  On-going      PT LONG TERM GOAL #5   Title  Patient to demonstrate step-through gait pattern with 1 handrail and no evidence of instability up 13 steps.     Time  8    Period  Weeks    Status  On-going            Plan - 11/06/17 1730    Clinical Impression Statement  [name] reporting her back is not bothering her lately however, primary concern today is groin and R buttocks pain.  Pt. with increased tension, TP's and ttp in R proximal hip musculature, which responded well to manual therapy.  Duration of session focused on gentle lumbopelvic strengthening activities and update of HEP as pt. to return to therapy after R hip injection on 7.16.19.  Will monitor pt. response to updated HEP and injection at this visit.      PT Treatment/Interventions  ADLs/Self Care Home Management;Cryotherapy;Electrical Stimulation;Moist Heat;Gait training;Stair training;Functional mobility training;Therapeutic activities;Therapeutic exercise;Manual techniques;Patient/family education;Neuromuscular re-education;Balance training;Scar mobilization;Passive range of motion;Dry needling;Energy conservation;Splinting;Taping;Vasopneumatic Device    Consulted and Agree with Plan of Care  Patient       Patient will benefit from skilled therapeutic intervention in order to improve the following deficits and impairments:  Decreased endurance, Decreased scar mobility, Pain, Decreased strength, Decreased activity tolerance, Decreased balance, Decreased mobility, Difficulty walking, Improper body mechanics, Decreased range of motion, Impaired flexibility, Postural dysfunction  Visit Diagnosis: Pain in right leg  Muscle weakness (generalized)  Difficulty in walking, not elsewhere classified  Other symptoms and signs involving the musculoskeletal  system     Problem List Patient Active Problem List   Diagnosis Date Noted  . Neck abscess 07/10/2017  . Hypotension 07/10/2017  . Hyponatremia 07/10/2017  . FH: colon cancer 04/04/2017  . Cellulitis of breast   . Septic shock ([place])   . Breast cancer of upper-outer quadrant of left female breast ([place]) 01/03/2016  . 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 Spondylolisthesis of lumbar region 04/28/2013    Class: Chronic  . Dehydration 08/09/2011  . Hypertension 08/09/2011  . Hyperlipidemia LDL goal < 100 08/09/2011  . Lymphocytic colitis 08/09/2011  . TIA (transient ischemic attack) 08/09/2011  . ANXIETY 02/24/2010  . DEPRESSION 02/24/2010  . FLATULENCE-GAS-BLOATING 02/07/2008  . OTH&UNSPEC NONINFECTIOUS GASTROENTERITIS&COLITIS 12/12/2007  . DIARRHEA 10/10/2007  . ABDOMINAL PAIN, GENERALIZED 10/10/2007    Bess Harvest, PTA 11/06/17 6:11 PM  [facility] [address]  [place], [place], [address] Phone: [phone]   Fax:  [phone]  Name: [name] MRN: [id number] Date of Birth: 1961-02-01

## 2017-11-14 ENCOUNTER — Ambulatory Visit
Admission: RE | Admit: 2017-11-14 | Discharge: 2017-11-14 | Disposition: A | Discharge status: Home / Self Care | Payer: Medicaid Other | Source: Ambulatory Visit | Attending: Orthopedic Surgery | Admitting: Orthopedic Surgery

## 2017-11-14 DIAGNOSIS — M5416 Radiculopathy, lumbar region: Secondary | ICD-10-CM

## 2017-11-14 MED ORDER — IOPAMIDOL (ISOVUE-M 200) INJECTION 41%
1.0000 mL | Freq: Once | INTRAMUSCULAR | Status: AC
  Administered 2017-11-14: 1 mL via INTRA_ARTICULAR

## 2017-11-14 MED ORDER — METHYLPREDNISOLONE ACETATE 40 MG/ML INJ SUSP (RADIOLOG
120.0000 mg | Freq: Once | INTRAMUSCULAR | Status: AC
  Administered 2017-11-14: 120 mg via INTRA_ARTICULAR

## 2017-11-20 ENCOUNTER — Ambulatory Visit: Payer: Medicaid Other | Admitting: Physical Therapy

## 2017-12-06 ENCOUNTER — Ambulatory Visit: Payer: Medicaid Other | Attending: Orthopedic Surgery | Admitting: Physical Therapy

## 2017-12-06 ENCOUNTER — Encounter: Payer: Self-pay | Admitting: Physical Therapy

## 2017-12-06 DIAGNOSIS — R29898 Other symptoms and signs involving the musculoskeletal system: Secondary | ICD-10-CM | POA: Insufficient documentation

## 2017-12-06 DIAGNOSIS — R262 Difficulty in walking, not elsewhere classified: Secondary | ICD-10-CM | POA: Diagnosis present

## 2017-12-06 DIAGNOSIS — M79604 Pain in right leg: Secondary | ICD-10-CM | POA: Insufficient documentation

## 2017-12-06 DIAGNOSIS — M6281 Muscle weakness (generalized): Secondary | ICD-10-CM | POA: Diagnosis present

## 2017-12-06 NOTE — Therapy (Signed)
[facility] [address]  [place], [place], [address] Phone: [phone]   Fax:  [phone]  Physical Therapy Treatment  Patient Details  Name: [name] MRN: [id number] Date of Birth: [birth year] Referring Provider: Phylliss Bob, MD   Encounter Date: 12/06/2017  PT End of Session - 12/06/17 1728    Visit Number  4    Number of Visits  15    Date for PT Re-Evaluation  12/18/17    Authorization Type  Medicaid    Authorization Time Period  07/22-09/01    Authorization - Visit Number  1    Authorization - Number of Visits  12    PT Start Time  1645    PT Stop Time  1726    PT Time Calculation (min)  41 min    Activity Tolerance  Patient tolerated treatment well;Patient limited by pain    Behavior During Therapy  [facility] for tasks assessed/performed       Past Medical History:  Diagnosis Date  . Anxiety   . Arthritis    lower back  . Breast cancer ([place])   . Breast cancer of upper-outer quadrant of left female breast ([place]) 01/03/2016  . Complication of anesthesia    hx. of combativeness as child; had severe migraine headache day after back surgery  . Cough due to ACE inhibitor 01/28/2016  . Depression   . Eczema    right hip  . High cholesterol   . History of radiation therapy 05/29/16- 07/14/16   Left Breast/ 50.4 Gy n 25 fractions, Left Breast Boost/ 10 Gy in 5 fractions.   . Hypertension    states under control with meds., has been on med. since age 28  . Lymphocytic colitis   . Migraines   . Personal history of radiation therapy   . PONV (postoperative nausea and vomiting)   . Sciatica   . Sinus drainage 01/28/2016  . Urinary urgency     Past Surgical History:  Procedure Laterality Date  . ANTERIOR LAT LUMBAR FUSION Left 07/11/2017   Procedure: LEFT EXTREME LATERAL INTERBODY FUSION AND INSTRUMENTATION AND ALLOGRAFT LUMBAR 1-2/2-3;  Surgeon: Phylliss Bob, MD;  Location: [facility];  Service:  Orthopedics;  Laterality: Left;  . BREAST BIOPSY    . BREAST LUMPECTOMY     left 2017  . BREAST LUMPECTOMY WITH NEEDLE LOCALIZATION AND AXILLARY SENTINEL LYMPH NODE BX Left 02/04/2016   Procedure: LEFT BREAST LUMPECTOMY WITH 2 NEEDLE LOCALIZATIONS AND LEFT AXILLARY SENTINEL LYMPH NODE BIOPSY;  Surgeon: [name], MD;  Location: [facility];  Service: General;  Laterality: Left;  LEFT BREAST LUMPECTOMY WITH 2 NEEDLE LOCALIZATIONS AND LEFT AXILLARY SENTINEL LYMPH NODE BIOPSY  . [place]  . COLONOSCOPY     Hx: of  . CYSTO  09/29/2005   with anterior repair  . ENDOMETRIAL ABLATION  09/29/2005  . INCISION AND DRAINAGE ABSCESS Left 02/21/2016   Procedure: INCISION AND DRAINAGE ABSCESS;  Surgeon: Excell Seltzer, MD;  Location[facility];  Service: General;  Laterality: Left;  . INCONTINENCE SURGERY  09/29/2005  . KNEE ARTHROSCOPY W/ DEBRIDEMENT    . LUMBAR FUSION  04/28/2013   L3-4  . TONSILLECTOMY     age 71  . TUBAL LIGATION    . WISDOM TOOTH EXTRACTION     as a teenager    There were no vitals filed for this visit.  Subjective Assessment - 12/06/17 1647  Subjective  Reports that she had injection in SI joint which has relieved pain in lateral thigh. Still having pain in posterior knee     Pertinent History  hx of breast CA in remission, L3-4 fusion 2014, hx of neck abcess with MRSA, HTN    Diagnostic tests  10/17/17 lumbar MRI: persistent stenosis L2-3, improved appearance L1-2    Patient Stated Goals  not to feel pain    Currently in Pain?  Yes    Pain Score  7     Pain Location  Groin    Pain Orientation  Right    Pain Descriptors / Indicators  Tightness;Burning    Pain Type  Chronic pain                       [facility] Adult PT Treatment/Exercise - 12/06/17 0001      Exercises   Exercises  Knee/Hip      Lumbar Exercises: Stretches   Passive Hamstring Stretch  Right;Left;2 reps;30 seconds    Passive Hamstring Stretch  Limitations  strap supine    Gastroc Stretch  Right;Left;2 reps;20 seconds;Limitations    Gastroc Stretch Limitations  prostretch at counter    Other Lumbar Stretch Exercise  "Butterfly" groin stretch with PT OP to tolerance 2 x 30 sec       Lumbar Exercises: Aerobic   Nustep  Lvl 3, 7 min       Lumbar Exercises: Supine   Bridge with Ball Squeeze  10 reps unable to tolerate d/t L shoulder pain post lumpectomy      Knee/Hip Exercises: Standing   Heel Raises  Both;1 set;20 reps;Limitations    Heel Raises Limitations  heel-toe raises at counter top    Other Standing Knee Exercises  sidestepping with red TB around toes 2x77ft      Knee/Hip Exercises: Sidelying   Hip ABduction  Strengthening;Right;Left;1 set;10 reps;Limitations    Hip ABduction Limitations  TCs for alignment    Hip ADduction  Strengthening;Right;Left;1 set;10 reps;Limitations    Hip ADduction Limitations  TCs for alignment      Manual Therapy   Manual Therapy  Soft tissue mobilization    Manual therapy comments  prone, supine    Soft tissue mobilization  R medial and lateral HS and gastroc, R adductor- significant soft tissue restriction in HS and gastroc, less in adductor; pt with significant tenderness but tolerable             PT Education - 12/06/17 1728    Education Details  addition to HEP    Person(s) Educated  Patient    Methods  Explanation;Demonstration;Tactile cues;Verbal cues;Handout    Comprehension  Returned demonstration;Verbalized understanding       PT Short Term Goals - 11/06/17 1805      PT SHORT TERM GOAL #1   Title  Patient to be independent with initial HEP.    Time  2    Period  Weeks    Status  Achieved        PT Long Term Goals - 10/31/17 1705      PT LONG TERM GOAL #1   Title  Patient to be independent with advanced HEP.    Time  8    Period  Weeks    Status  On-going      PT LONG TERM GOAL #2   Title  Patient to demonstrate >=4+/5 strength in B LEs.    Time  8  Period  Weeks    Status  On-going      PT LONG TERM GOAL #3   Title  Patient to report tolerance of 30 min of standing without pain.    Time  8    Period  Weeks    Status  On-going      PT LONG TERM GOAL #4   Title  Patient to report tolerance of supine positioning for 2 hours to improve sleep tolerance.     Time  8    Period  Weeks    Status  On-going      PT LONG TERM GOAL #5   Title  Patient to demonstrate step-through gait pattern with 1 handrail and no evidence of instability up 13 steps.     Time  8    Period  Weeks    Status  On-going            Plan - 12/06/17 1729    Clinical Impression Statement  Patient arrived to session with report of improvement in R lateral hip pain since SI injection, still remaining R posterior knee and groin pain with numbness in toes. Patient tolerated STM to R medial and lateral HS and gastroc, and R adductor- significant soft tissue restriction in HS and gastroc, less in adductor. Significant tenderness during STM but tolerable per patient's report. Report of feeling mildly "less tight" after manual therapy but still with c/o numbness in toes. Unable to tolerate bridging this date d/t L shoulder post-lumpectomy pain. Patient reporting relief after HS and gastroc stretching. Administered updated HEP handout to patient at end of session who reported understanding. Patient reporting less pain at end of session compared to beginning.     PT Treatment/Interventions  ADLs/Self Care Home Management;Cryotherapy;Electrical Stimulation;Moist Heat;Gait training;Stair training;Functional mobility training;Therapeutic activities;Therapeutic exercise;Manual techniques;Patient/family education;Neuromuscular re-education;Balance training;Scar mobilization;Passive range of motion;Dry needling;Energy conservation;Splinting;Taping;Vasopneumatic Device    Consulted and Agree with Plan of Care  Patient       Patient will benefit from skilled therapeutic  intervention in order to improve the following deficits and impairments:  Decreased endurance, Decreased scar mobility, Pain, Decreased strength, Decreased activity tolerance, Decreased balance, Decreased mobility, Difficulty walking, Improper body mechanics, Decreased range of motion, Impaired flexibility, Postural dysfunction  Visit Diagnosis: Pain in right leg  Muscle weakness (generalized)  Difficulty in walking, not elsewhere classified  Other symptoms and signs involving the musculoskeletal system     Problem List Patient Active Problem List   Diagnosis Date Noted  . Neck abscess 07/10/2017  . Hypotension 07/10/2017  . Hyponatremia 07/10/2017  . FH: colon cancer 04/04/2017  . Cellulitis of breast   . Septic shock ([place])   . Breast cancer of upper-outer quadrant of left female breast ([place]) 01/03/2016  . 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 Spondylolisthesis of lumbar region 04/28/2013    Class: Chronic  . Dehydration 08/09/2011  . Hypertension 08/09/2011  . Hyperlipidemia LDL goal < 100 08/09/2011  . Lymphocytic colitis 08/09/2011  . TIA (transient ischemic attack) 08/09/2011  . ANXIETY 02/24/2010  . DEPRESSION 02/24/2010  . FLATULENCE-GAS-BLOATING 02/07/2008  . OTH&UNSPEC NONINFECTIOUS GASTROENTERITIS&COLITIS 12/12/2007  . DIARRHEA 10/10/2007  . ABDOMINAL PAIN, GENERALIZED 10/10/2007    Janene Harvey, PT, DPT 12/06/17 5:36 PM   [facility] [address]  [place], [place], [address] Phone: [phone]   Fax:  [phone]  Name: [name] MRN: [id number] Date of  Birth: 06-29-1960

## 2017-12-09 ENCOUNTER — Encounter

## 2017-12-10 ENCOUNTER — Encounter (HOSPITAL_COMMUNITY): Payer: Self-pay | Admitting: *Deleted

## 2017-12-10 ENCOUNTER — Ambulatory Visit: Payer: Medicaid Other | Admitting: Physical Therapy

## 2017-12-10 DIAGNOSIS — R262 Difficulty in walking, not elsewhere classified: Secondary | ICD-10-CM

## 2017-12-10 DIAGNOSIS — M79604 Pain in right leg: Secondary | ICD-10-CM

## 2017-12-10 DIAGNOSIS — M6281 Muscle weakness (generalized): Secondary | ICD-10-CM

## 2017-12-10 DIAGNOSIS — R29898 Other symptoms and signs involving the musculoskeletal system: Secondary | ICD-10-CM

## 2017-12-10 NOTE — Therapy (Signed)
[facility] [address]  [place], [place], [address] Phone: [phone]   Fax:  [phone]  Physical Therapy Treatment  Patient Details  Name: [name] MRN: [id number] Date of Birth: 1960/12/13 Referring Provider: Phylliss Bob, MD   Encounter Date: 12/10/2017  PT End of Session - 12/10/17 2595    Visit Number  5    Number of Visits  15    Date for PT Re-Evaluation  12/18/17    Authorization Type  Medicaid    Authorization Time Period  07/22-09/01    Authorization - Visit Number  2    Authorization - Number of Visits  12    PT Start Time  1659    PT Stop Time  6387    PT Time Calculation (min)  47 min    Activity Tolerance  Patient tolerated treatment well;Patient limited by pain    Behavior During Therapy  [facility] for tasks assessed/performed       Past Medical History:  Diagnosis Date  . Anxiety   . Arthritis    lower back  . Breast cancer ([place])   . Breast cancer of upper-outer quadrant of left female breast ([place]) 01/03/2016  . Complication of anesthesia    hx. of combativeness as 57; had severe migraine headache day after back surgery  . Cough due to ACE inhibitor 01/28/2016  . Depression   . Eczema    right hip  . High cholesterol   . History of radiation therapy 05/29/16- 07/14/16   Left Breast/ 50.4 Gy n 25 fractions, Left Breast Boost/ 10 Gy in 5 fractions.   . Hypertension    states under control with meds., has been on med. since age 57  . Lymphocytic colitis   . Migraines   . Personal history of radiation therapy   . PONV (postoperative nausea and vomiting)   . Sciatica   . Sinus drainage 01/28/2016  . Urinary urgency     Past Surgical History:  Procedure Laterality Date  . ANTERIOR LAT LUMBAR FUSION Left 07/11/2017   Procedure: LEFT EXTREME LATERAL INTERBODY FUSION AND INSTRUMENTATION AND ALLOGRAFT LUMBAR 1-2/2-3;  Surgeon: Phylliss Bob, MD;  Location: [place];  Service:  Orthopedics;  Laterality: Left;  . BREAST BIOPSY    . BREAST LUMPECTOMY     left 2017  . BREAST LUMPECTOMY WITH NEEDLE LOCALIZATION AND AXILLARY SENTINEL LYMPH NODE BX Left 02/04/2016   Procedure: LEFT BREAST LUMPECTOMY WITH 2 NEEDLE LOCALIZATIONS AND LEFT AXILLARY SENTINEL LYMPH NODE BIOPSY;  Surgeon: [name], MD;  Location: [place];  Service: General;  Laterality: Left;  LEFT BREAST LUMPECTOMY WITH 2 NEEDLE LOCALIZATIONS AND LEFT AXILLARY SENTINEL LYMPH NODE BIOPSY  . [place]  . COLONOSCOPY     Hx: of  . CYSTO  09/29/2005   with anterior repair  . ENDOMETRIAL ABLATION  09/29/2005  . INCISION AND DRAINAGE ABSCESS Left 02/21/2016   Procedure: INCISION AND DRAINAGE ABSCESS;  Surgeon: Excell Seltzer, MD;  [facility];  Service: General;  Laterality: Left;  . INCONTINENCE SURGERY  09/29/2005  . KNEE ARTHROSCOPY W/ DEBRIDEMENT    . LUMBAR FUSION  04/28/2013   L3-4  . TONSILLECTOMY     age 57  . TUBAL LIGATION    . WISDOM TOOTH EXTRACTION     as a teenager    There were no vitals filed for this visit.  Subjective Assessment - 12/10/17 1700  Subjective  Reports compliance with HEP and has been trying to massage pressure points out of leg.     Pertinent History  hx of breast CA in remission, L3-4 fusion 2014, hx of neck abcess with MRSA, HTN    Diagnostic tests  10/17/17 lumbar MRI: persistent stenosis L2-3, improved appearance L1-2    Patient Stated Goals  not to feel pain    Currently in Pain?  Yes    Pain Score  7     Pain Location  Leg lateral hip and posterior thigh    Pain Orientation  Right    Pain Descriptors / Indicators  Burning;Tightness    Pain Type  Chronic pain                       [facility] Adult PT Treatment/Exercise - 12/10/17 0001      Lumbar Exercises: Aerobic   Nustep  Lvl 3, 7 min       Knee/Hip Exercises: Stretches   Passive Hamstring Stretch  Left;2 reps;30 seconds;Limitations    Passive  Hamstring Stretch Limitations  supine strap    Other Knee/Hip Stretches  R adductor stretch 1/2 kneeling- pt unable to tolerate      Knee/Hip Exercises: Supine   Other Supine Knee/Hip Exercises  resisted hip flexion red TB; x20 c/o R groin pain but tolerable      Knee/Hip Exercises: Sidelying   Hip ADduction  Strengthening;Right;Left;1 set;10 reps;Limitations difficulty and pain on R LE but tolerable    Clams  red TB around knees; 15x each side TCs to avoid rotating trunk      Manual Therapy   Manual Therapy  Soft tissue mobilization    Manual therapy comments  prone    Soft tissue mobilization  R medial and lateral HS and gastroc, R adductor- significant soft tissue restriction in HS and gastroc, less in adductor; pt with significant tenderness but tolerable             PT Education - 12/10/17 1748    Education Details  edu on DN    Person(s) Educated  Patient    Methods  Explanation    Comprehension  Verbalized understanding       PT Short Term Goals - 11/06/17 1805      PT SHORT TERM GOAL #1   Title  Patient to be independent with initial HEP.    Time  2    Period  Weeks    Status  Achieved        PT Long Term Goals - 10/31/17 1705      PT LONG TERM GOAL #1   Title  Patient to be independent with advanced HEP.    Time  8    Period  Weeks    Status  On-going      PT LONG TERM GOAL #2   Title  Patient to demonstrate >=4+/5 strength in B LEs.    Time  8    Period  Weeks    Status  On-going      PT LONG TERM GOAL #3   Title  Patient to report tolerance of 30 min of standing without pain.    Time  8    Period  Weeks    Status  On-going      PT LONG TERM GOAL #4   Title  Patient to report tolerance of supine positioning for 2 hours to improve sleep tolerance.     Time  8    Period  Weeks    Status  On-going      PT LONG TERM GOAL #5   Title  Patient to demonstrate step-through gait pattern with 1 handrail and no evidence of instability up 13 steps.      Time  8    Period  Weeks    Status  On-going            Plan - 12/10/17 1748    Clinical Impression Statement  Patient arrived to session with report of persisting trigger points in R LE. Bandage on R lateral thigh- reports she burned herself by sitting on a hot pack and falling asleep at home. Tolerated STM to R medial and lateral HS and gastroc- significant soft tissue restriction in medial HS, lateral gastroc, and along ITB; pt with significant tenderness but tolerable, noting relief from this. Educated patient on DN and handed out info sheet. Patient interested in this modality next session. Patient with c/o R groin pain with resisted hip flexion and sidelying adduction, however reporting tolerance and "wanting to push through it." Patient unable to tolerate  kneeling adductor stretch d/t intensity- discontinued. Ended session with patient reporting no increase in pain.     PT Treatment/Interventions  ADLs/Self Care Home Management;Cryotherapy;Electrical Stimulation;Moist Heat;Gait training;Stair training;Functional mobility training;Therapeutic activities;Therapeutic exercise;Manual techniques;Patient/family education;Neuromuscular re-education;Balance training;Scar mobilization;Passive range of motion;Dry needling;Energy conservation;Splinting;Taping;Vasopneumatic Device    PT Next Visit Plan  dry needling    Consulted and Agree with Plan of Care  Patient       Patient will benefit from skilled therapeutic intervention in order to improve the following deficits and impairments:  Decreased endurance, Decreased scar mobility, Pain, Decreased strength, Decreased activity tolerance, Decreased balance, Decreased mobility, Difficulty walking, Improper body mechanics, Decreased range of motion, Impaired flexibility, Postural dysfunction  Visit Diagnosis: Pain in right leg  Muscle weakness (generalized)  Difficulty in walking, not elsewhere classified  Other symptoms and signs involving  the musculoskeletal system     Problem List Patient Active Problem List   Diagnosis Date Noted  . Neck abscess 07/10/2017  . Hypotension 07/10/2017  . Hyponatremia 07/10/2017  . FH: colon cancer 04/04/2017  . Cellulitis of breast   . Septic shock ([place])   . Breast cancer of upper-outer quadrant of left female breast ([place]) 01/03/2016  . 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 Spondylolisthesis of lumbar region 04/28/2013    Class: Chronic  . Dehydration 08/09/2011  . Hypertension 08/09/2011  . Hyperlipidemia LDL goal < 100 08/09/2011  . Lymphocytic colitis 08/09/2011  . TIA (transient ischemic attack) 08/09/2011  . ANXIETY 02/24/2010  . DEPRESSION 02/24/2010  . FLATULENCE-GAS-BLOATING 02/07/2008  . OTH&UNSPEC NONINFECTIOUS GASTROENTERITIS&COLITIS 12/12/2007  . DIARRHEA 10/10/2007  . ABDOMINAL PAIN, GENERALIZED 10/10/2007   Janene Harvey, PT, DPT 12/10/17 5:55 PM   [facility] [address]  [place], [place], [address] Phone: [phone]   Fax:  [phone]  Name: [name] MRN: [id number] Date of Birth: 1960-05-25

## 2017-12-10 NOTE — Patient Instructions (Signed)

## 2017-12-12 ENCOUNTER — Encounter: Payer: Medicaid Other | Admitting: Physical Therapy

## 2017-12-13 ENCOUNTER — Ambulatory Visit: Payer: Medicaid Other | Admitting: Physical Therapy

## 2017-12-13 ENCOUNTER — Encounter: Payer: Self-pay | Admitting: Physical Therapy

## 2017-12-13 DIAGNOSIS — M6281 Muscle weakness (generalized): Secondary | ICD-10-CM

## 2017-12-13 DIAGNOSIS — M79604 Pain in right leg: Secondary | ICD-10-CM | POA: Diagnosis not present

## 2017-12-13 DIAGNOSIS — R262 Difficulty in walking, not elsewhere classified: Secondary | ICD-10-CM

## 2017-12-13 DIAGNOSIS — R29898 Other symptoms and signs involving the musculoskeletal system: Secondary | ICD-10-CM

## 2017-12-13 NOTE — Therapy (Signed)
[facility] [address]  [place], [place], [address] Phone: [phone]   Fax:  [phone]  Physical Therapy Treatment  Patient Details  Name: [name] MRN: [id number] Date of Birth: [birth year] Referring Provider: Phylliss Bob, MD   Encounter Date: 12/13/2017  PT End of Session - 12/13/17 1751    Visit Number  6    Number of Visits  15    Date for PT Re-Evaluation  12/18/17    Authorization Type  Medicaid    Authorization Time Period  07/22-09/01    Authorization - Visit Number  3    Authorization - Number of Visits  12    PT Start Time  1659    PT Stop Time  1750    PT Time Calculation (min)  51 min    Activity Tolerance  Patient tolerated treatment well    Behavior During Therapy  [facility] for tasks assessed/performed       Past Medical History:  Diagnosis Date  . Anxiety   . Arthritis    lower back  . Breast cancer ([place])   . Breast cancer of upper-outer quadrant of left female breast ([place]) 01/03/2016  . Complication of anesthesia    hx. of combativeness as child; had severe migraine headache day after back surgery  . Cough due to ACE inhibitor 01/28/2016  . Depression   . Eczema    right hip  . High cholesterol   . History of radiation therapy 05/29/16- 07/14/16   Left Breast/ 50.4 Gy n 25 fractions, Left Breast Boost/ 10 Gy in 5 fractions.   . Hypertension    states under control with meds., has been on med. since age 26  . Lymphocytic colitis   . Migraines   . Personal history of radiation therapy   . PONV (postoperative nausea and vomiting)   . Sciatica   . Sinus drainage 01/28/2016  . Urinary urgency     Past Surgical History:  Procedure Laterality Date  . ANTERIOR LAT LUMBAR FUSION Left 07/11/2017   Procedure: LEFT EXTREME LATERAL INTERBODY FUSION AND INSTRUMENTATION AND ALLOGRAFT LUMBAR 1-2/2-3;  Surgeon: Phylliss Bob, MD;  Location: [place];  Service: Orthopedics;  Laterality: Left;  .  BREAST BIOPSY    . BREAST LUMPECTOMY     left 2017  . BREAST LUMPECTOMY WITH NEEDLE LOCALIZATION AND AXILLARY SENTINEL LYMPH NODE BX Left 02/04/2016   Procedure: LEFT BREAST LUMPECTOMY WITH 2 NEEDLE LOCALIZATIONS AND LEFT AXILLARY SENTINEL LYMPH NODE BIOPSY;  Surgeon: [name], MD;  Location: [place];  Service: General;  Laterality: Left;  LEFT BREAST LUMPECTOMY WITH 2 NEEDLE LOCALIZATIONS AND LEFT AXILLARY SENTINEL LYMPH NODE BIOPSY  . [place]  . COLONOSCOPY     Hx: of  . CYSTO  09/29/2005   with anterior repair  . ENDOMETRIAL ABLATION  09/29/2005  . INCISION AND DRAINAGE ABSCESS Left 02/21/2016   Procedure: INCISION AND DRAINAGE ABSCESS;  Surgeon: Excell Seltzer, MD;  [facility];  Service: General;  Laterality: Left;  . INCONTINENCE SURGERY  09/29/2005  . KNEE ARTHROSCOPY W/ DEBRIDEMENT    . LUMBAR FUSION  04/28/2013   L3-4  . TONSILLECTOMY     age 28  . TUBAL LIGATION    . WISDOM TOOTH EXTRACTION     as a teenager    There were no vitals filed for this visit.  Subjective Assessment - 12/13/17 1659    Subjective  Reports  she had an MD appointment for injection to R lateral hip. MD advised to work on Standard and stretching d/t weakness. Patient reporting improvement in symptoms after injection, still having R toe numbness.     Pertinent History  hx of breast CA in remission, L3-4 fusion 2014, hx of neck abcess with MRSA, HTN    Diagnostic tests  10/17/17 lumbar MRI: persistent stenosis L2-3, improved appearance L1-2    Patient Stated Goals  not to feel pain    Currently in Pain?  Yes    Pain Score  4     Pain Location  Leg   lateral hip and groin   Pain Orientation  Right    Pain Descriptors / Indicators  Tightness;Burning    Pain Type  Chronic pain                       [facility] Adult PT Treatment/Exercise - 12/13/17 0001      Lumbar Exercises: Stretches   Piriformis Stretch  Right;30  seconds;2 reps      Lumbar Exercises: Aerobic   Nustep  Lvl 3, 6 min       Knee/Hip Exercises: Stretches   Other Knee/Hip Stretches  SKTC 2x30" each LE      Knee/Hip Exercises: Sidelying   Hip ABduction  Strengthening;Right;10 reps;Limitations;2 sets    Hip ABduction Limitations  2x10 with glute med bias      Manual Therapy   Manual Therapy  Soft tissue mobilization    Manual therapy comments  prone    Soft tissue mobilization  STM & DTM R glutes (esp glute medius & minimus, medial HS and lateral gastroc       Trigger Point Dry Needling - 12/13/17 1710    Consent Given?  Yes    Education Handout Provided  Yes    Muscles Treated Lower Body  Gluteus minimus;Gluteus maximus;Hamstring;Gastrocnemius    Gluteus Maximus Response  Twitch response elicited;Palpable increased muscle length   R glute medius   Gluteus Minimus Response  Twitch response elicited;Palpable increased muscle length   Rt   Hamstring Response  Twitch response elicited;Palpable increased muscle length   R medial distal & mid HS   Gastrocnemius Response  Twitch response elicited;Palpable increased muscle length   R proximal gastroc          PT Education - 12/13/17 1751    Education Details  Advised patient to perform previously administered sidelying R hip abduction with glute med bias    Person(s) Educated  Patient    Methods  Explanation;Demonstration;Tactile cues;Verbal cues    Comprehension  Returned demonstration;Verbalized understanding       PT Short Term Goals - 11/06/17 1805      PT SHORT TERM GOAL #1   Title  Patient to be independent with initial HEP.    Time  2    Period  Weeks    Status  Achieved        PT Long Term Goals - 10/31/17 1705      PT LONG TERM GOAL #1   Title  Patient to be independent with advanced HEP.    Time  8    Period  Weeks    Status  On-going      PT LONG TERM GOAL #2   Title  Patient to demonstrate >=4+/5 strength in B LEs.    Time  8    Period  Weeks     Status  On-going  PT LONG TERM GOAL #3   Title  Patient to report tolerance of 30 min of standing without pain.    Time  8    Period  Weeks    Status  On-going      PT LONG TERM GOAL #4   Title  Patient to report tolerance of supine positioning for 2 hours to improve sleep tolerance.     Time  8    Period  Weeks    Status  On-going      PT LONG TERM GOAL #5   Title  Patient to demonstrate step-through gait pattern with 1 handrail and no evidence of instability up 13 steps.     Time  8    Period  Weeks    Status  On-going            Plan - 12/13/17 1752    Clinical Impression Statement  Patient arrived to session with report of improvement in R LE symptoms after receiving injection to R lateral hip yesterday. Reports MD also notified her that she has a Baker's cyst and will benefit from DN. Per MD's orders- worked on R glute med strengthening and stretching at end of session today. Advised patient to perform previously administered sidelying R hip abduction with glute med bias. TC/VCs required to correct form and alignment. Patient declined HEP handout. Also performed glute stretching after dry needling at end of session. Patient with report of improvement in symptoms at end of session, still with persisting R toe numbness.      PT Treatment/Interventions  ADLs/Self Care Home Management;Cryotherapy;Electrical Stimulation;Moist Heat;Gait training;Stair training;Functional mobility training;Therapeutic activities;Therapeutic exercise;Manual techniques;Patient/family education;Neuromuscular re-education;Balance training;Scar mobilization;Passive range of motion;Dry needling;Energy conservation;Splinting;Taping;Vasopneumatic Device    Consulted and Agree with Plan of Care  Patient       Patient will benefit from skilled therapeutic intervention in order to improve the following deficits and impairments:  Decreased endurance, Decreased scar mobility, Pain, Decreased strength,  Decreased activity tolerance, Decreased balance, Decreased mobility, Difficulty walking, Improper body mechanics, Decreased range of motion, Impaired flexibility, Postural dysfunction  Visit Diagnosis: Pain in right leg  Muscle weakness (generalized)  Difficulty in walking, not elsewhere classified  Other symptoms and signs involving the musculoskeletal system     Problem List Patient Active Problem List   Diagnosis Date Noted  . Neck abscess 07/10/2017  . Hypotension 07/10/2017  . Hyponatremia 07/10/2017  . FH: colon cancer 04/04/2017  . Cellulitis of breast   . Septic shock ([place])   . Breast cancer of upper-outer quadrant of left female breast ([place]) 01/03/2016  . 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 Spondylolisthesis of lumbar region 04/28/2013    Class: Chronic  . Dehydration 08/09/2011  . Hypertension 08/09/2011  . Hyperlipidemia LDL goal < 100 08/09/2011  . Lymphocytic colitis 08/09/2011  . TIA (transient ischemic attack) 08/09/2011  . ANXIETY 02/24/2010  . DEPRESSION 02/24/2010  . FLATULENCE-GAS-BLOATING 02/07/2008  . OTH&UNSPEC NONINFECTIOUS GASTROENTERITIS&COLITIS 12/12/2007  . DIARRHEA 10/10/2007  . ABDOMINAL PAIN, GENERALIZED 10/10/2007    Janene Harvey, PT, DPT 12/13/17 6:01 PM   [facility] [address]  [place], [place], [address] Phone: [phone]   Fax:  [phone]  Name: [name] MRN: [id number] Date of Birth: June 10, 1960

## 2017-12-17 ENCOUNTER — Ambulatory Visit: Payer: Medicaid Other | Admitting: Physical Therapy

## 2017-12-17 DIAGNOSIS — R262 Difficulty in walking, not elsewhere classified: Secondary | ICD-10-CM

## 2017-12-17 DIAGNOSIS — M6281 Muscle weakness (generalized): Secondary | ICD-10-CM

## 2017-12-17 DIAGNOSIS — M79604 Pain in right leg: Secondary | ICD-10-CM | POA: Diagnosis not present

## 2017-12-17 DIAGNOSIS — R29898 Other symptoms and signs involving the musculoskeletal system: Secondary | ICD-10-CM

## 2017-12-17 NOTE — Therapy (Signed)
[facility] [address]  [place], [place], [address] Phone: [phone]   Fax:  [phone]  Physical Therapy Treatment  Patient Details  Name: [name] MRN: [id number] Date of Birth: Jul 13, 1960 Referring Provider: Phylliss Bob, MD   Encounter Date: 12/17/2017  PT End of Session - 12/17/17 1750    Visit Number  7    Number of Visits  15    Date for PT Re-Evaluation  01/14/18    Authorization Type  Medicaid    Authorization Time Period  07/22-09/01    Authorization - Visit Number  4    Authorization - Number of Visits  12    PT Start Time  1701    PT Stop Time  6384    PT Time Calculation (min)  47 min    Activity Tolerance  Patient tolerated treatment well    Behavior During Therapy  [facility] for tasks assessed/performed       Past Medical History:  Diagnosis Date  . Anxiety   . Arthritis    lower back  . Breast cancer ([place])   . Breast cancer of upper-outer quadrant of left female breast ([place]) 01/03/2016  . Complication of anesthesia    hx. of combativeness as child; had severe migraine headache day after back surgery  . Cough due to ACE inhibitor 01/28/2016  . Depression   . Eczema    right hip  . High cholesterol   . History of radiation therapy 05/29/16- 07/14/16   Left Breast/ 50.4 Gy n 25 fractions, Left Breast Boost/ 10 Gy in 5 fractions.   . Hypertension    states under control with meds., has been on med. since age 2  . Lymphocytic colitis   . Migraines   . Personal history of radiation therapy   . PONV (postoperative nausea and vomiting)   . Sciatica   . Sinus drainage 01/28/2016  . Urinary urgency     Past Surgical History:  Procedure Laterality Date  . ANTERIOR LAT LUMBAR FUSION Left 07/11/2017   Procedure: LEFT EXTREME LATERAL INTERBODY FUSION AND INSTRUMENTATION AND ALLOGRAFT LUMBAR 1-2/2-3;  Surgeon: Phylliss Bob, MD;  Location: [place];  Service: Orthopedics;  Laterality: Left;   . BREAST BIOPSY    . BREAST LUMPECTOMY     left 2017  . BREAST LUMPECTOMY WITH NEEDLE LOCALIZATION AND AXILLARY SENTINEL LYMPH NODE BX Left 02/04/2016   Procedure: LEFT BREAST LUMPECTOMY WITH 2 NEEDLE LOCALIZATIONS AND LEFT AXILLARY SENTINEL LYMPH NODE BIOPSY;  Surgeon: [name], MD;  Location: [place];  Service: General;  Laterality: Left;  LEFT BREAST LUMPECTOMY WITH 2 NEEDLE LOCALIZATIONS AND LEFT AXILLARY SENTINEL LYMPH NODE BIOPSY  . [place]  . COLONOSCOPY     Hx: of  . CYSTO  09/29/2005   with anterior repair  . ENDOMETRIAL ABLATION  09/29/2005  . INCISION AND DRAINAGE ABSCESS Left 02/21/2016   Procedure: INCISION AND DRAINAGE ABSCESS;  Surgeon: Excell Seltzer, MD;  Location: [facility];  Service: General;  Laterality: Left;  . INCONTINENCE SURGERY  09/29/2005  . KNEE ARTHROSCOPY W/ DEBRIDEMENT    . LUMBAR FUSION  04/28/2013   L3-4  . TONSILLECTOMY     age 55  . TUBAL LIGATION    . WISDOM TOOTH EXTRACTION     as a teenager    There were no vitals filed for this visit.  Subjective Assessment - 12/17/17 1703    Subjective  Reports  DN has helped more than any other treatment. Was able to walk through [place] for the first time in a year without electric scooter. Also noticed knot on R knee that is bothering her a little. Report that she feels that she is inmproving.,    Pertinent History  hx of breast CA in remission, L3-4 fusion 2014, hx of neck abcess with MRSA, HTN    Diagnostic tests  10/17/17 lumbar MRI: persistent stenosis L2-3, improved appearance L1-2    Patient Stated Goals  not to feel pain    Currently in Pain?  Yes    Pain Score  5     Pain Location  Leg   along inner hamstring   Pain Orientation  Right    Pain Descriptors / Indicators  Tightness;Burning    Pain Type  Chronic pain                       [facility] Adult PT Treatment/Exercise - 12/17/17 0001      Exercises   Exercises  Knee/Hip      Lumbar  Exercises: Aerobic   Nustep  Lvl 4, 6 min       Knee/Hip Exercises: Stretches   Passive Hamstring Stretch  Left;2 reps;30 seconds;Limitations    Passive Hamstring Stretch Limitations  supine strap    Gastroc Stretch  Right;2 reps;30 seconds;Limitations    Gastroc Stretch Limitations  against wall    Soleus Stretch  Right;2 reps;30 seconds;Limitations    Soleus Stretch Limitations  against wall      Knee/Hip Exercises: Standing   Hip Abduction  Stengthening;Right;Knee straight;2 sets;Limitations   VC/TCs for glute med bias alignment   Abduction Limitations  red TB with 2 ski poles      Knee/Hip Exercises: Sidelying   Clams  red TB around knees; 15x each side   VC/TCs to avoid trunk rotation     Manual Therapy   Manual Therapy  Soft tissue mobilization    Manual therapy comments  prone    Soft tissue mobilization  STM & DTM R med/lat HS, gastroc & soleus       Trigger Point Dry Needling - 12/17/17 1733    Consent Given?  Yes    Muscles Treated Lower Body  Hamstring;Gastrocnemius;Soleus    Hamstring Response  Twitch response elicited;Palpable increased muscle length   R mid medial & prox lateral HS   Gastrocnemius Response  Twitch response elicited;Palpable increased muscle length   R proximal medial gastroc   Soleus Response  Twitch response elicited;Palpable increased muscle length   R medial to lateral soleus          PT Education - 12/17/17 1749    Education Details  administered red TB for standing hip abduction     Person(s) Educated  Patient    Methods  Explanation;Demonstration;Tactile cues;Verbal cues    Comprehension  Verbalized understanding;Returned demonstration       PT Short Term Goals - 12/17/17 1758      PT SHORT TERM GOAL #1   Title  Patient to be independent with initial HEP.    Time  2    Period  Weeks    Status  Achieved        PT Long Term Goals - 12/17/17 1758      PT LONG TERM GOAL #1   Title  Patient to be independent with  advanced HEP.    Time  4    Period  Weeks  Status  Partially Met   met for current   Target Date  01/14/18      PT LONG TERM GOAL #2   Title  Patient to demonstrate >=4+/5 strength in B LEs.    Time  4    Period  Weeks    Status  On-going   NT this session   Target Date  01/14/18      PT LONG TERM GOAL #3   Title  Patient to report tolerance of 30 min of standing without pain.    Time  4    Period  Weeks    Status  Partially Met   able to walk around [place] without pain for ~30 min   Target Date  01/14/18      PT LONG TERM GOAL #4   Title  Patient to report tolerance of supine positioning for 2 hours to improve sleep tolerance.     Time  4    Period  Weeks    Status  On-going   patient able to lay supine throughout treatment session without c/o pain   Target Date  01/14/18      PT LONG TERM GOAL #5   Title  Patient to demonstrate step-through gait pattern with 1 handrail and no evidence of instability up 13 steps.     Time  4    Period  Weeks    Status  On-going   NT this session   Target Date  01/14/18            Plan - 12/17/17 1751    Clinical Impression Statement  Patient arrived to session with report of dramatic improvement in R LE pain after DN last session, noting she was able to walk around [place] without use of an electric scooter for the first time in a year. Worked on HS, gastroc, and soleus stretching immediately after DNing. Patient with good tolerance. Patient reporting R groin pain after performing sidelying hip ABD at home, opted for standing hip ABD with banded resistance. Patient noting muscle burn and unsteadiness, requesting to perform this activity at solid surface for support next time. Administered red TB to progress this HEP exercise at home. Patient reported understanding. Completed session with patient's report of improvement in pain levels. Patient showing improvement thus far, would benefit from skilled PT services 2x/week for 4 weeks to  address goals and progress pain management. Measurements not taken this session d/t focus on manual therapy.     PT Treatment/Interventions  ADLs/Self Care Home Management;Cryotherapy;Electrical Stimulation;Moist Heat;Gait training;Stair training;Functional mobility training;Therapeutic activities;Therapeutic exercise;Manual techniques;Patient/family education;Neuromuscular re-education;Balance training;Scar mobilization;Passive range of motion;Dry needling;Energy conservation;Splinting;Taping;Vasopneumatic Device    Consulted and Agree with Plan of Care  Patient       Patient will benefit from skilled therapeutic intervention in order to improve the following deficits and impairments:  Decreased endurance, Decreased scar mobility, Pain, Decreased strength, Decreased activity tolerance, Decreased balance, Decreased mobility, Difficulty walking, Improper body mechanics, Decreased range of motion, Impaired flexibility, Postural dysfunction  Visit Diagnosis: Pain in right leg  Muscle weakness (generalized)  Difficulty in walking, not elsewhere classified  Other symptoms and signs involving the musculoskeletal system     Problem List Patient Active Problem List   Diagnosis Date Noted  . Neck abscess 07/10/2017  . Hypotension 07/10/2017  . Hyponatremia 07/10/2017  . FH: colon cancer 04/04/2017  . Cellulitis of breast   . Septic shock ([place])   . Breast cancer of upper-outer quadrant of left female breast (  [place]) 01/03/2016  . 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 Spondylolisthesis of lumbar region 04/28/2013    Class: Chronic  . Dehydration 08/09/2011  . Hypertension 08/09/2011  . Hyperlipidemia LDL goal < 100 08/09/2011  . Lymphocytic colitis 08/09/2011  . TIA (transient ischemic attack) 08/09/2011  . ANXIETY 02/24/2010  . DEPRESSION 02/24/2010  . FLATULENCE-GAS-BLOATING 02/07/2008  . OTH&UNSPEC NONINFECTIOUS  GASTROENTERITIS&COLITIS 12/12/2007  . DIARRHEA 10/10/2007  . ABDOMINAL PAIN, GENERALIZED 10/10/2007    Janene Harvey, PT, DPT 12/17/17 6:07 PM   [facility] [address]  [place], [place], [address] Phone: [phone]   Fax:  [phone]  Name: [name] MRN: [id number] Date of Birth: April 07, 1961

## 2017-12-20 ENCOUNTER — Ambulatory Visit: Payer: Medicaid Other

## 2017-12-20 DIAGNOSIS — R262 Difficulty in walking, not elsewhere classified: Secondary | ICD-10-CM

## 2017-12-20 DIAGNOSIS — M79604 Pain in right leg: Secondary | ICD-10-CM | POA: Diagnosis not present

## 2017-12-20 DIAGNOSIS — M6281 Muscle weakness (generalized): Secondary | ICD-10-CM

## 2017-12-20 DIAGNOSIS — R29898 Other symptoms and signs involving the musculoskeletal system: Secondary | ICD-10-CM

## 2017-12-20 NOTE — Therapy (Signed)
[facility] [address]  [place], [place], [address] Phone: [phone]   Fax:  [phone]  Physical Therapy Treatment  Patient Details  Name: Amaira Safley MRN: [id number] Date of Birth: 1961-02-04 Referring Provider: Phylliss Bob, MD   Encounter Date: 12/20/2017  PT End of Session - 12/20/17 1702    Visit Number  8    Number of Visits  15    Date for PT Re-Evaluation  01/14/18    Authorization Type  Medicaid    Authorization Time Period  07/22-09/01    Authorization - Visit Number  5    Authorization - Number of Visits  12    PT Start Time  1700    PT Stop Time  1742    PT Time Calculation (min)  42 min    Activity Tolerance  Patient tolerated treatment well    Behavior During Therapy  [facility] for tasks assessed/performed       Past Medical History:  Diagnosis Date  . Anxiety   . Arthritis    lower back  . Breast cancer ([place])   . Breast cancer of upper-outer quadrant of left female breast ([place]) 01/03/2016  . Complication of anesthesia    hx. of combativeness as child; had severe migraine headache day after back surgery  . Cough due to ACE inhibitor 01/28/2016  . Depression   . Eczema    right hip  . High cholesterol   . History of radiation therapy 05/29/16- 07/14/16   Left Breast/ 50.4 Gy n 25 fractions, Left Breast Boost/ 10 Gy in 5 fractions.   . Hypertension    states under control with meds., has been on med. since age 57  . Lymphocytic colitis   . Migraines   . Personal history of radiation therapy   . PONV (postoperative nausea and vomiting)   . Sciatica   . Sinus drainage 01/28/2016  . Urinary urgency     Past Surgical History:  Procedure Laterality Date  . ANTERIOR LAT LUMBAR FUSION Left 07/11/2017   Procedure: LEFT EXTREME LATERAL INTERBODY FUSION AND INSTRUMENTATION AND ALLOGRAFT LUMBAR 1-2/2-3;  Surgeon: Phylliss Bob, MD;  Location: [facility];  Service: Orthopedics;  Laterality: Left;   . BREAST BIOPSY    . BREAST LUMPECTOMY     left 2017  . BREAST LUMPECTOMY WITH NEEDLE LOCALIZATION AND AXILLARY SENTINEL LYMPH NODE BX Left 02/04/2016   Procedure: LEFT BREAST LUMPECTOMY WITH 2 NEEDLE LOCALIZATIONS AND LEFT AXILLARY SENTINEL LYMPH NODE BIOPSY;  Surgeon: [name], MD;  Location: [facility];  Service: General;  Laterality: Left;  LEFT BREAST LUMPECTOMY WITH 2 NEEDLE LOCALIZATIONS AND LEFT AXILLARY SENTINEL LYMPH NODE BIOPSY  . [place]  . COLONOSCOPY     Hx: of  . CYSTO  09/29/2005   with anterior repair  . ENDOMETRIAL ABLATION  09/29/2005  . INCISION AND DRAINAGE ABSCESS Left 02/21/2016   Procedure: INCISION AND DRAINAGE ABSCESS;  Surgeon: Excell Seltzer, MD;  Location[facility];  Service: General;  Laterality: Left;  . INCONTINENCE SURGERY  09/29/2005  . KNEE ARTHROSCOPY W/ DEBRIDEMENT    . LUMBAR FUSION  04/28/2013   L3-4  . TONSILLECTOMY     age 33  . TUBAL LIGATION    . WISDOM TOOTH EXTRACTION     as a teenager    There were no vitals filed for this visit.  Subjective Assessment - 12/20/17 1703    Subjective  Pt.  reporting burning down L posterior thigh today without known trigger.      Pertinent History  hx of breast CA in remission, L3-4 fusion 2014, hx of neck abcess with MRSA, HTN    Diagnostic tests  10/17/17 lumbar MRI: persistent stenosis L2-3, improved appearance L1-2    Patient Stated Goals  not to feel pain    Currently in Pain?  Yes    Pain Location  Leg    Pain Orientation  Right    Pain Descriptors / Indicators  Burning    Pain Type  Chronic pain    Pain Radiating Towards  Radiating into R posterior knee     Pain Onset  More than a month ago    Pain Frequency  Intermittent    Multiple Pain Sites  No                       [facility] Adult PT Treatment/Exercise - 12/20/17 1716      Lumbar Exercises: Stretches   Passive Hamstring Stretch  Right;2 reps;30 seconds    Passive Hamstring Stretch  Limitations  strap supine    Single Knee to Chest Stretch  Right;Left;1 rep;30 seconds    Lower Trunk Rotation  10 seconds;5 reps    Other Lumbar Stretch Exercise  Seated R sciatic nerve glide x 10 reps       Lumbar Exercises: Aerobic   Nustep  Lvl 4, 7 min       Lumbar Exercises: Supine   Clam  10 reps;3 seconds    Clam Limitations  with red looped TB at knees     Bent Knee Raise  10 reps;3 seconds    Bent Knee Raise Limitations  with red looped TB at knees     Bridge  3 seconds    Bridge Limitations  12 reps       Knee/Hip Exercises: Stretches   Other Knee/Hip Stretches  R sciatic nerve glide x 15 reps       Manual Therapy   Manual Therapy  Soft tissue mobilization    Manual therapy comments  sidelying with R LE elevated on bolster     Soft tissue mobilization  STM to R buttocks R lateral hip musculare and R lateral calf                PT Short Term Goals - 12/17/17 1758      PT SHORT TERM GOAL #1   Title  Patient to be independent with initial HEP.    Time  2    Period  Weeks    Status  Achieved        PT Long Term Goals - 12/17/17 1758      PT LONG TERM GOAL #1   Title  Patient to be independent with advanced HEP.    Time  4    Period  Weeks    Status  Partially Met   met for current   Target Date  01/14/18      PT LONG TERM GOAL #2   Title  Patient to demonstrate >=4+/5 strength in B LEs.    Time  4    Period  Weeks    Status  On-going   NT this session   Target Date  01/14/18      PT LONG TERM GOAL #3   Title  Patient to report tolerance of 30 min of standing without pain.    Time  4  Period  Weeks    Status  Partially Met   able to walk around [place] without pain for ~30 min   Target Date  01/14/18      PT LONG TERM GOAL #4   Title  Patient to report tolerance of supine positioning for 2 hours to improve sleep tolerance.     Time  4    Period  Weeks    Status  On-going   patient able to lay supine throughout treatment session  without c/o pain   Target Date  01/14/18      PT LONG TERM GOAL #5   Title  Patient to demonstrate step-through gait pattern with 1 handrail and no evidence of instability up 13 steps.     Time  4    Period  Weeks    Status  On-going   NT this session   Target Date  01/14/18            Plan - 12/20/17 1713    Clinical Impression Statement  Pt. primary complaint to start session today of worsening "burning pain", in R lateral/posterior thigh without known trigger.  "burning" pain reduced following LE stretching and gentle lumbopelvic strengthening activities today however "burning" pain into R foot remained at intensity of initial at start of visit.  Ended visit with pt. instructed to use rolling pin at home for STM/strumming to R lateral calf for improvement in tissue quality and reduction in tenderness.  Will monitor responses to this at upcoming visit.      PT Treatment/Interventions  ADLs/Self Care Home Management;Cryotherapy;Electrical Stimulation;Moist Heat;Gait training;Stair training;Functional mobility training;Therapeutic activities;Therapeutic exercise;Manual techniques;Patient/family education;Neuromuscular re-education;Balance training;Scar mobilization;Passive range of motion;Dry needling;Energy conservation;Splinting;Taping;Vasopneumatic Device    Consulted and Agree with Plan of Care  Patient       Patient will benefit from skilled therapeutic intervention in order to improve the following deficits and impairments:  Decreased endurance, Decreased scar mobility, Pain, Decreased strength, Decreased activity tolerance, Decreased balance, Decreased mobility, Difficulty walking, Improper body mechanics, Decreased range of motion, Impaired flexibility, Postural dysfunction  Visit Diagnosis: Pain in right leg  Muscle weakness (generalized)  Difficulty in walking, not elsewhere classified  Other symptoms and signs involving the musculoskeletal system     Problem  List Patient Active Problem List   Diagnosis Date Noted  . Neck abscess 07/10/2017  . Hypotension 07/10/2017  . Hyponatremia 07/10/2017  . FH: colon cancer 04/04/2017  . Cellulitis of breast   . Septic shock ([place])   . Breast cancer of upper-outer quadrant of left female breast ([place]) 01/03/2016  . 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 Spondylolisthesis of lumbar region 04/28/2013    Class: Chronic  . Dehydration 08/09/2011  . Hypertension 08/09/2011  . Hyperlipidemia LDL goal < 100 08/09/2011  . Lymphocytic colitis 08/09/2011  . TIA (transient ischemic attack) 08/09/2011  . ANXIETY 02/24/2010  . DEPRESSION 02/24/2010  . FLATULENCE-GAS-BLOATING 02/07/2008  . OTH&UNSPEC NONINFECTIOUS GASTROENTERITIS&COLITIS 12/12/2007  . DIARRHEA 10/10/2007  . ABDOMINAL PAIN, GENERALIZED 10/10/2007    Bess Harvest, PTA 12/20/17 6:14 PM   [facility] [address]  [place], [place], [address] Phone: [phone]   Fax:  [phone]  Name: Sharene Krikorian MRN: [id number] Date of Birth: 07/06/1960

## 2017-12-24 ENCOUNTER — Encounter: Payer: Self-pay | Admitting: Physical Therapy

## 2017-12-24 ENCOUNTER — Ambulatory Visit: Payer: Medicaid Other | Admitting: Physical Therapy

## 2017-12-24 DIAGNOSIS — R29898 Other symptoms and signs involving the musculoskeletal system: Secondary | ICD-10-CM

## 2017-12-24 DIAGNOSIS — M6281 Muscle weakness (generalized): Secondary | ICD-10-CM

## 2017-12-24 DIAGNOSIS — R262 Difficulty in walking, not elsewhere classified: Secondary | ICD-10-CM

## 2017-12-24 DIAGNOSIS — M79604 Pain in right leg: Secondary | ICD-10-CM

## 2017-12-24 NOTE — Therapy (Signed)
[facility] [address]  [place], [place], [address] Phone: [phone]   Fax:  [phone]  Physical Therapy Treatment  Patient Details  Name: [name] MRN: [id number] Date of Birth: 1960-11-12 Referring Provider: Phylliss Bob, MD   Encounter Date: 12/24/2017  PT End of Session - 12/24/17 1750    Visit Number  9    Number of Visits  15    Date for PT Re-Evaluation  01/14/18    Authorization Type  Medicaid    Authorization Time Period  07/22-09/01    Authorization - Visit Number  6    Authorization - Number of Visits  12    PT Start Time  1700    PT Stop Time  6962    PT Time Calculation (min)  45 min    Activity Tolerance  Patient tolerated treatment well;Patient limited by pain    Behavior During Therapy  [facility] for tasks assessed/performed       Past Medical History:  Diagnosis Date  . Anxiety   . Arthritis    lower back  . Breast cancer ([place])   . Breast cancer of upper-outer quadrant of left female breast ([place]) 01/03/2016  . Complication of anesthesia    hx. of combativeness as child; had severe migraine headache day after back surgery  . Cough due to ACE inhibitor 01/28/2016  . Depression   . Eczema    right hip  . High cholesterol   . History of radiation therapy 05/29/16- 07/14/16   Left Breast/ 50.4 Gy n 25 fractions, Left Breast Boost/ 10 Gy in 5 fractions.   . Hypertension    states under control with meds., has been on med. since age 37  . Lymphocytic colitis   . Migraines   . Personal history of radiation therapy   . PONV (postoperative nausea and vomiting)   . Sciatica   . Sinus drainage 01/28/2016  . Urinary urgency     Past Surgical History:  Procedure Laterality Date  . ANTERIOR LAT LUMBAR FUSION Left 07/11/2017   Procedure: LEFT EXTREME LATERAL INTERBODY FUSION AND INSTRUMENTATION AND ALLOGRAFT LUMBAR 1-2/2-3;  Surgeon: Phylliss Bob, MD;  Location: [facility];  Service:  Orthopedics;  Laterality: Left;  . BREAST BIOPSY    . BREAST LUMPECTOMY     left 2017  . BREAST LUMPECTOMY WITH NEEDLE LOCALIZATION AND AXILLARY SENTINEL LYMPH NODE BX Left 02/04/2016   Procedure: LEFT BREAST LUMPECTOMY WITH 2 NEEDLE LOCALIZATIONS AND LEFT AXILLARY SENTINEL LYMPH NODE BIOPSY;  Surgeon: [name], MD;  Location: [facility];  Service: General;  Laterality: Left;  LEFT BREAST LUMPECTOMY WITH 2 NEEDLE LOCALIZATIONS AND LEFT AXILLARY SENTINEL LYMPH NODE BIOPSY  . [place]  . COLONOSCOPY     Hx: of  . CYSTO  09/29/2005   with anterior repair  . ENDOMETRIAL ABLATION  09/29/2005  . INCISION AND DRAINAGE ABSCESS Left 02/21/2016   Procedure: INCISION AND DRAINAGE ABSCESS;  Surgeon: Excell Seltzer, MD;  Location: [facility];  Service: General;  Laterality: Left;  . INCONTINENCE SURGERY  09/29/2005  . KNEE ARTHROSCOPY W/ DEBRIDEMENT    . LUMBAR FUSION  04/28/2013   L3-4  . TONSILLECTOMY     age 37  . TUBAL LIGATION    . WISDOM TOOTH EXTRACTION     as a teenager    There were no vitals filed for this visit.  Subjective Assessment - 12/24/17 1703  Subjective  Patient reports that her MD put her on Gabapentin- patient had adverse rxn. Reports she was walking into walls and really off balance. Called MD and he is aware. Reports it has worn off a little and was not able to perform HEP over the weekend.     Pertinent History  hx of breast CA in remission, L3-4 fusion 2014, hx of neck abcess with MRSA, HTN    Diagnostic tests  10/17/17 lumbar MRI: persistent stenosis L2-3, improved appearance L1-2    Patient Stated Goals  not to feel pain    Currently in Pain?  Yes    Pain Score  4     Pain Location  Leg   R inner leg and groin   Pain Orientation  Right    Pain Descriptors / Indicators  Burning    Pain Type  Chronic pain                       [facility] Adult PT Treatment/Exercise - 12/24/17 0001      Exercises   Exercises   Lumbar;Knee/Hip      Lumbar Exercises: Stretches   Passive Hamstring Stretch  Right;2 reps;30 seconds    Passive Hamstring Stretch Limitations  strap supine    Hip Flexor Stretch  Right;2 reps;30 seconds;Limitations    Hip Flexor Stretch Limitations  mod thomas with strap to tolerance      Lumbar Exercises: Aerobic   Nustep  Lvl 4, 7 min       Lumbar Exercises: Supine   Other Supine Lumbar Exercises  windshield wipers to tolerance; 5x5" each side      Knee/Hip Exercises: Standing   Hip Flexion  Right;Stengthening;1 set;10 reps;Knee straight;Limitations;Left    Hip Flexion Limitations  with chair support; red TB    Wall Squat  2 sets;10 reps;Limitations   cues for control and speed   Wall Squat Limitations  to tolerance    Other Standing Knee Exercises  standing isometric hip IR/ER with ball- unable to tolerate      Knee/Hip Exercises: Seated   Other Seated Knee/Hip Exercises  B hip ER with ball b/w ankles and green TB; 2x10    Other Seated Knee/Hip Exercises  B hip IR with ball b/w knees and yellow TB; 2x10      Knee/Hip Exercises: Sidelying   Clams  3 beat pulse w/ red TB around knees; 10x each side             PT Education - 12/24/17 1750    Education Details  HEP update; advised patient to remove exercises that are no longer challenging     Person(s) Educated  Patient    Methods  Explanation;Demonstration;Tactile cues;Verbal cues;Handout    Comprehension  Returned demonstration;Verbalized understanding       PT Short Term Goals - 12/17/17 1758      PT SHORT TERM GOAL #1   Title  Patient to be independent with initial HEP.    Time  2    Period  Weeks    Status  Achieved        PT Long Term Goals - 12/17/17 1758      PT LONG TERM GOAL #1   Title  Patient to be independent with advanced HEP.    Time  4    Period  Weeks    Status  Partially Met   met for current   Target Date  01/14/18  PT LONG TERM GOAL #2   Title  Patient to demonstrate >=4+/5  strength in B LEs.    Time  4    Period  Weeks    Status  On-going   NT this session   Target Date  01/14/18      PT LONG TERM GOAL #3   Title  Patient to report tolerance of 30 min of standing without pain.    Time  4    Period  Weeks    Status  Partially Met   able to walk around [place] without pain for ~30 min   Target Date  01/14/18      PT LONG TERM GOAL #4   Title  Patient to report tolerance of supine positioning for 2 hours to improve sleep tolerance.     Time  4    Period  Weeks    Status  On-going   patient able to lay supine throughout treatment session without c/o pain   Target Date  01/14/18      PT LONG TERM GOAL #5   Title  Patient to demonstrate step-through gait pattern with 1 handrail and no evidence of instability up 13 steps.     Time  4    Period  Weeks    Status  On-going   NT this session   Target Date  01/14/18            Plan - 12/24/17 1751    Clinical Impression Statement  Patient arrived to session with report of adverse reaction from recently prescribed Gabapentin. Reports having instability and dizziness. Discontinued this medication as a result and contacted her MD. Reporting improved symptoms today. Patient tolerated progressive hip strengthening and LE stretching exercises this date. Attempted to try isometric hip IR/ER against ball- unable to tolerate d/t unsteadiness and was discontinued. Better able to tolerate hip IR and ER with banded resistance in sitting. Administered hip IR HEP handout to patient who reported understanding. Ended session with report of improvement in symptoms compared to beginning of session.     PT Treatment/Interventions  ADLs/Self Care Home Management;Cryotherapy;Electrical Stimulation;Moist Heat;Gait training;Stair training;Functional mobility training;Therapeutic activities;Therapeutic exercise;Manual techniques;Patient/family education;Neuromuscular re-education;Balance training;Scar mobilization;Passive range of  motion;Dry needling;Energy conservation;Splinting;Taping;Vasopneumatic Device    Consulted and Agree with Plan of Care  Patient       Patient will benefit from skilled therapeutic intervention in order to improve the following deficits and impairments:  Decreased endurance, Decreased scar mobility, Pain, Decreased strength, Decreased activity tolerance, Decreased balance, Decreased mobility, Difficulty walking, Improper body mechanics, Decreased range of motion, Impaired flexibility, Postural dysfunction  Visit Diagnosis: Pain in right leg  Muscle weakness (generalized)  Difficulty in walking, not elsewhere classified  Other symptoms and signs involving the musculoskeletal system     Problem List Patient Active Problem List   Diagnosis Date Noted  . Neck abscess 07/10/2017  . Hypotension 07/10/2017  . Hyponatremia 07/10/2017  . FH: colon cancer 04/04/2017  . Cellulitis of breast   . Septic shock ([place])   . Breast cancer of upper-outer quadrant of left female breast ([place]) 01/03/2016  . 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 Spondylolisthesis of lumbar region 04/28/2013    Class: Chronic  . Dehydration 08/09/2011  . Hypertension 08/09/2011  . Hyperlipidemia LDL goal < 100 08/09/2011  . Lymphocytic colitis 08/09/2011  . TIA (transient ischemic attack) 08/09/2011  . ANXIETY 02/24/2010  . DEPRESSION 02/24/2010  . FLATULENCE-GAS-BLOATING 02/07/2008  .  OTH&UNSPEC NONINFECTIOUS GASTROENTERITIS&COLITIS 12/12/2007  . DIARRHEA 10/10/2007  . ABDOMINAL PAIN, GENERALIZED 10/10/2007    Janene Harvey, PT, DPT 12/24/17 5:56 PM   [facility] [address]  [place], [place], [address] Phone: [phone]   Fax:  [phone]  Name: Kallee Nam MRN: [id number] Date of Birth: Mar 03, 1961

## 2017-12-27 ENCOUNTER — Ambulatory Visit: Payer: Medicaid Other

## 2017-12-27 DIAGNOSIS — R29898 Other symptoms and signs involving the musculoskeletal system: Secondary | ICD-10-CM

## 2017-12-27 DIAGNOSIS — M79604 Pain in right leg: Secondary | ICD-10-CM | POA: Diagnosis not present

## 2017-12-27 DIAGNOSIS — R262 Difficulty in walking, not elsewhere classified: Secondary | ICD-10-CM

## 2017-12-27 DIAGNOSIS — M6281 Muscle weakness (generalized): Secondary | ICD-10-CM

## 2017-12-27 NOTE — Therapy (Signed)
[facility] [address]  [place], [place], [address] Phone: [phone]   Fax:  [phone]  Physical Therapy Treatment  Patient Details  Name: [name] MRN: [id number] Date of Birth: 1960-11-25 Referring Provider: Phylliss Bob, MD   Encounter Date: 12/27/2017  PT End of Session - 12/27/17 1705    Visit Number  10    Number of Visits  15    Date for PT Re-Evaluation  01/14/18    Authorization Type  Medicaid    Authorization Time Period  07/22-09/01    Authorization - Visit Number  7    Authorization - Number of Visits  12    PT Start Time  1700    PT Stop Time  1745    PT Time Calculation (min)  45 min    Activity Tolerance  Patient tolerated treatment well;Patient limited by pain    Behavior During Therapy  [facility] for tasks assessed/performed       Past Medical History:  Diagnosis Date  . Anxiety   . Arthritis    lower back  . Breast cancer ([place])   . Breast cancer of upper-outer quadrant of left female breast ([place]) 01/03/2016  . Complication of anesthesia    hx. of combativeness as child; had severe migraine headache day after back surgery  . Cough due to ACE inhibitor 01/28/2016  . Depression   . Eczema    right hip  . High cholesterol   . History of radiation therapy 05/29/16- 07/14/16   Left Breast/ 50.4 Gy n 25 fractions, Left Breast Boost/ 10 Gy in 5 fractions.   . Hypertension    states under control with meds., has been on med. since age 89  . Lymphocytic colitis   . Migraines   . Personal history of radiation therapy   . PONV (postoperative nausea and vomiting)   . Sciatica   . Sinus drainage 01/28/2016  . Urinary urgency     Past Surgical History:  Procedure Laterality Date  . ANTERIOR LAT LUMBAR FUSION Left 07/11/2017   Procedure: LEFT EXTREME LATERAL INTERBODY FUSION AND INSTRUMENTATION AND ALLOGRAFT LUMBAR 1-2/2-3;  Surgeon: Phylliss Bob, MD;  Location: [facility];  Service:  Orthopedics;  Laterality: Left;  . BREAST BIOPSY    . BREAST LUMPECTOMY     left 2017  . BREAST LUMPECTOMY WITH NEEDLE LOCALIZATION AND AXILLARY SENTINEL LYMPH NODE BX Left 02/04/2016   Procedure: LEFT BREAST LUMPECTOMY WITH 2 NEEDLE LOCALIZATIONS AND LEFT AXILLARY SENTINEL LYMPH NODE BIOPSY;  Surgeon: [name], MD;  Location: [facility];  Service: General;  Laterality: Left;  LEFT BREAST LUMPECTOMY WITH 2 NEEDLE LOCALIZATIONS AND LEFT AXILLARY SENTINEL LYMPH NODE BIOPSY  . [place]  . COLONOSCOPY     Hx: of  . CYSTO  09/29/2005   with anterior repair  . ENDOMETRIAL ABLATION  09/29/2005  . INCISION AND DRAINAGE ABSCESS Left 02/21/2016   Procedure: INCISION AND DRAINAGE ABSCESS;  Surgeon: Excell Seltzer, MD;  [facility];  Service: General;  Laterality: Left;  . INCONTINENCE SURGERY  09/29/2005  . KNEE ARTHROSCOPY W/ DEBRIDEMENT    . LUMBAR FUSION  04/28/2013   L3-4  . TONSILLECTOMY     age 16  . TUBAL LIGATION    . WISDOM TOOTH EXTRACTION     as a teenager    There were no vitals filed for this visit.  Subjective Assessment - 12/27/17 1705  Subjective  Pt. noting some increased pain today.  Pt. noting MD with plans to put her on Lyrica medication however pt. has stopped taking Gabepentin.      Pertinent History  hx of breast CA in remission, L3-4 fusion 2014, hx of neck abcess with MRSA, HTN    Diagnostic tests  10/17/17 lumbar MRI: persistent stenosis L2-3, improved appearance L1-2    Patient Stated Goals  not to feel pain    Currently in Pain?  Yes    Pain Score  6     Pain Location  Leg    Pain Orientation  Right    Pain Descriptors / Indicators  Burning    Pain Type  Chronic pain    Pain Onset  More than a month ago    Pain Frequency  Intermittent    Aggravating Factors   walking    Multiple Pain Sites  No         [facility] PT Assessment - 12/27/17 1713      Strength   Right/Left Hip  Right;Left    Right Hip Flexion   4-/5    Right Hip ABduction  4+/5    Right Hip ADduction  4/5    Left Hip Flexion  4/5    Left Hip ABduction  4+/5    Left Hip ADduction  4+/5    Right/Left Knee  Right;Left    Right Knee Flexion  4/5    Right Knee Extension  4+/5    Left Knee Flexion  4+/5    Left Knee Extension  5/5    Right/Left Ankle  Right;Left    Right Ankle Dorsiflexion  4+/5    Right Ankle Plantar Flexion  4/5    Left Ankle Dorsiflexion  4+/5    Left Ankle Plantar Flexion  4+/5   pt. stopped due to L foot pain                   [facility] Adult PT Treatment/Exercise - 12/27/17 1807      Ambulation/Gait   Stairs  Yes    Stairs Assistance  6: Modified independent (Device/Increase time)    Stair Management Technique  One rail Right;Alternating pattern    Number of Stairs  14    Height of Stairs  8    Gait Comments  Pt. able to ascend/descned staris x 14 wit hone rail use reciprocally and with only slight R quad instability noted    Did reporting some foot pain with this     Lumbar Exercises: Aerobic   Nustep  Lvl 4, 7 min       Knee/Hip Exercises: Standing   Hip ADduction  Right;Left;Strengthening;10 reps    Hip ADduction Limitations  with red TB therapist anchored                PT Short Term Goals - 12/17/17 1758      PT SHORT TERM GOAL #1   Title  Patient to be independent with initial HEP.    Time  2    Period  Weeks    Status  Achieved        PT Long Term Goals - 12/27/17 1703      PT LONG TERM GOAL #1   Title  Patient to be independent with advanced HEP.    Time  4    Period  Weeks    Status  Partially Met   met for current     PT LONG  TERM GOAL #2   Title  Patient to demonstrate >=4+/5 strength in B LEs.    Time  4    Period  Weeks    Status  Partially Met      PT LONG TERM GOAL #3   Title  Patient to report tolerance of 30 min of standing without pain.    Time  4    Period  Weeks    Status  Partially Met   able to walk around [place] without pain  limiting however some pain for ~30-45 min     PT LONG TERM GOAL #4   Title  Patient to report tolerance of supine positioning for 2 hours to improve sleep tolerance.     Time  4    Period  Weeks    Status  Achieved      PT LONG TERM GOAL #5   Title  Patient to demonstrate step-through gait pattern with 1 handrail and no evidence of instability up 13 steps.     Time  4    Period  Weeks    Status  Partially Met   Pt. able to perform step-over-step with min rail use with only mild R quad instability           Plan - 12/27/17 1709    Clinical Impression Statement  Pt. making excellent progress toward goals demonstrating overall improvement in LE strength with MMT and now able to navigate stair reciprocally with only light rail use and mild foot pain.  Noted ~ 40 % improvement in overall pain since starting therapy and 70% improvement in function.  Pt. able to meet or partially meet all LTG's and on track to meet remaining goals in POC.      PT Treatment/Interventions  ADLs/Self Care Home Management;Cryotherapy;Electrical Stimulation;Moist Heat;Gait training;Stair training;Functional mobility training;Therapeutic activities;Therapeutic exercise;Manual techniques;Patient/family education;Neuromuscular re-education;Balance training;Scar mobilization;Passive range of motion;Dry needling;Energy conservation;Splinting;Taping;Vasopneumatic Device    Consulted and Agree with Plan of Care  Patient       Patient will benefit from skilled therapeutic intervention in order to improve the following deficits and impairments:  Decreased endurance, Decreased scar mobility, Pain, Decreased strength, Decreased activity tolerance, Decreased balance, Decreased mobility, Difficulty walking, Improper body mechanics, Decreased range of motion, Impaired flexibility, Postural dysfunction  Visit Diagnosis: Pain in right leg  Muscle weakness (generalized)  Difficulty in walking, not elsewhere  classified  Other symptoms and signs involving the musculoskeletal system     Problem List Patient Active Problem List   Diagnosis Date Noted  . Neck abscess 07/10/2017  . Hypotension 07/10/2017  . Hyponatremia 07/10/2017  . FH: colon cancer 04/04/2017  . Cellulitis of breast   . Septic shock ([place])   . Breast cancer of upper-outer quadrant of left female breast ([place]) 01/03/2016  . 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 Spondylolisthesis of lumbar region 04/28/2013    Class: Chronic  . Dehydration 08/09/2011  . Hypertension 08/09/2011  . Hyperlipidemia LDL goal < 100 08/09/2011  . Lymphocytic colitis 08/09/2011  . TIA (transient ischemic attack) 08/09/2011  . ANXIETY 02/24/2010  . DEPRESSION 02/24/2010  . FLATULENCE-GAS-BLOATING 02/07/2008  . OTH&UNSPEC NONINFECTIOUS GASTROENTERITIS&COLITIS 12/12/2007  . DIARRHEA 10/10/2007  . ABDOMINAL PAIN, GENERALIZED 10/10/2007    Bess Harvest, PTA 12/27/17 6:12 PM   [facility] [address]  [place], [place], [address] Phone: [phone]   Fax:  [phone]  Name: [name]  MRN: [id number] Date of Birth: 1960/09/08

## 2017-12-31 ENCOUNTER — Ambulatory Visit: Payer: Medicaid Other

## 2017-12-31 DIAGNOSIS — M79604 Pain in right leg: Secondary | ICD-10-CM

## 2017-12-31 DIAGNOSIS — M6281 Muscle weakness (generalized): Secondary | ICD-10-CM

## 2017-12-31 DIAGNOSIS — R29898 Other symptoms and signs involving the musculoskeletal system: Secondary | ICD-10-CM

## 2017-12-31 DIAGNOSIS — R262 Difficulty in walking, not elsewhere classified: Secondary | ICD-10-CM

## 2017-12-31 NOTE — Therapy (Signed)
[facility] [address]  [place], [place], [address] Phone: [phone]   Fax:  [phone]  Physical Therapy Treatment  Patient Details  Name: [name] MRN: [id number] Date of Birth: [birth year] Referring Provider: Phylliss Bob, MD   Encounter Date: 12/31/2017  PT End of Session - 12/31/17 1708    Visit Number  11    Number of Visits  15    Date for PT Re-Evaluation  01/14/18    Authorization Type  Medicaid    Authorization Time Period  07/22-09/01    Authorization - Visit Number  8    Authorization - Number of Visits  12    PT Start Time  1700    PT Stop Time  1738    PT Time Calculation (min)  38 min    Activity Tolerance  Patient tolerated treatment well;Patient limited by pain    Behavior During Therapy  [facility] for tasks assessed/performed       Past Medical History:  Diagnosis Date  . Anxiety   . Arthritis    lower back  . Breast cancer ([place])   . Breast cancer of upper-outer quadrant of left female breast ([place]) 01/03/2016  . Complication of anesthesia    hx. of combativeness as child; had severe migraine headache day after back surgery  . Cough due to ACE inhibitor 01/28/2016  . Depression   . Eczema    right hip  . High cholesterol   . History of radiation therapy 05/29/16- 07/14/16   Left Breast/ 50.4 Gy n 25 fractions, Left Breast Boost/ 10 Gy in 5 fractions.   . Hypertension    states under control with meds., has been on med. since age 17  . Lymphocytic colitis   . Migraines   . Personal history of radiation therapy   . PONV (postoperative nausea and vomiting)   . Sciatica   . Sinus drainage 01/28/2016  . Urinary urgency     Past Surgical History:  Procedure Laterality Date  . ANTERIOR LAT LUMBAR FUSION Left 07/11/2017   Procedure: LEFT EXTREME LATERAL INTERBODY FUSION AND INSTRUMENTATION AND ALLOGRAFT LUMBAR 1-2/2-3;  Surgeon: Phylliss Bob, MD;  Location: [place];  Service:  Orthopedics;  Laterality: Left;  . BREAST BIOPSY    . BREAST LUMPECTOMY     left 2017  . BREAST LUMPECTOMY WITH NEEDLE LOCALIZATION AND AXILLARY SENTINEL LYMPH NODE BX Left 02/04/2016   Procedure: LEFT BREAST LUMPECTOMY WITH 2 NEEDLE LOCALIZATIONS AND LEFT AXILLARY SENTINEL LYMPH NODE BIOPSY;  Surgeon: [name], MD;  Location: [facility] [place] [facility];  Service: General;  Laterality: Left;  LEFT BREAST LUMPECTOMY WITH 2 NEEDLE LOCALIZATIONS AND LEFT AXILLARY SENTINEL LYMPH NODE BIOPSY  . [place]  . COLONOSCOPY     Hx: of  . CYSTO  09/29/2005   with anterior repair  . ENDOMETRIAL ABLATION  09/29/2005  . INCISION AND DRAINAGE ABSCESS Left 02/21/2016   Procedure: INCISION AND DRAINAGE ABSCESS;  Surgeon: Excell Seltzer, MD;  Location: [facility];  Service: General;  Laterality: Left;  . INCONTINENCE SURGERY  09/29/2005  . KNEE ARTHROSCOPY W/ DEBRIDEMENT    . LUMBAR FUSION  04/28/2013   L3-4  . TONSILLECTOMY     age 25  . TUBAL LIGATION    . WISDOM TOOTH EXTRACTION     as a teenager    There were no vitals filed for this visit.  Subjective Assessment - 12/31/17 1704  Subjective  Pt. reporting she hit her knee on the door seal.      Pertinent History  hx of breast CA in remission, L3-4 fusion 2014, hx of neck abcess with MRSA, HTN    Diagnostic tests  10/17/17 lumbar MRI: persistent stenosis L2-3, improved appearance L1-2    Patient Stated Goals  not to feel pain    Currently in Pain?  Yes    Pain Score  4     Pain Location  Leg    Pain Orientation  Right    Pain Descriptors / Indicators  Burning    Pain Type  Chronic pain    Pain Radiating Towards  Radiating into R posterior knee     Pain Onset  More than a month ago    Pain Frequency  Intermittent    Multiple Pain Sites  No                       [facility] Adult PT Treatment/Exercise - 12/31/17 1719      Lumbar Exercises: Stretches   Piriformis Stretch  Right;Left;30 seconds;1 rep       Lumbar Exercises: Aerobic   Nustep  Lvl 4, 7 min       Lumbar Exercises: Seated   Sit to Stand  15 reps    Sit to Stand Limitations  without UE support       Lumbar Exercises: Supine   Bridge  3 seconds;15 reps    Bridge Limitations  Hooklying with cues required     Isometric Hip Flexion  10 reps;3 seconds    Isometric Hip Flexion Limitations  single LE      Knee/Hip Exercises: Stretches   ITB Stretch  1 rep;Left;Right;20 seconds    ITB Stretch Limitations  with strap     Other Knee/Hip Stretches  Butterfly stretch x 30 sec       Knee/Hip Exercises: Standing   Hip Flexion  Right;Stengthening;1 set;10 reps;Knee straight;Limitations;Left    Hip Flexion Limitations  red TB at ankle; chair     Hip Abduction  Right;Left;Stengthening;10 reps;Knee straight    Abduction Limitations  Red TB at ankles; chair     Hip Extension  Right;Left;10 reps;Knee straight;Stengthening    Extension Limitations  red TB at ankles; chair              PT Education - 12/31/17 1727    Education Details  HEP update with walking program     Person(s) Educated  Patient    Methods  Explanation;Verbal cues;Handout    Comprehension  Verbalized understanding;Verbal cues required;Need further instruction       PT Short Term Goals - 12/17/17 1758      PT SHORT TERM GOAL #1   Title  Patient to be independent with initial HEP.    Time  2    Period  Weeks    Status  Achieved        PT Long Term Goals - 12/27/17 1703      PT LONG TERM GOAL #1   Title  Patient to be independent with advanced HEP.    Time  4    Period  Weeks    Status  Partially Met   met for current     PT LONG TERM GOAL #2   Title  Patient to demonstrate >=4+/5 strength in B LEs.    Time  4    Period  Weeks    Status  Partially  Met      PT LONG TERM GOAL #3   Title  Patient to report tolerance of 30 min of standing without pain.    Time  4    Period  Weeks    Status  Partially Met   able to walk around [place] without  pain limiting however some pain for ~30-45 min     PT LONG TERM GOAL #4   Title  Patient to report tolerance of supine positioning for 2 hours to improve sleep tolerance.     Time  4    Period  Weeks    Status  Achieved      PT LONG TERM GOAL #5   Title  Patient to demonstrate step-through gait pattern with 1 handrail and no evidence of instability up 13 steps.     Time  4    Period  Weeks    Status  Partially Met   Pt. able to perform step-over-step with min rail use with only mild R quad instability           Plan - 12/31/17 1710    Clinical Impression Statement  Jinnifer doing well today however notes some tenderness in posterior calf, which improved following STM to this area.  Progressed lumbopelvic strengthening activities well today and updated HEP to include walking program.  Pt. aware that next visit is last visit in current Medicaid authorization and may be open to 30-day hold.      PT Treatment/Interventions  ADLs/Self Care Home Management;Cryotherapy;Electrical Stimulation;Moist Heat;Gait training;Stair training;Functional mobility training;Therapeutic activities;Therapeutic exercise;Manual techniques;Patient/family education;Neuromuscular re-education;Balance training;Scar mobilization;Passive range of motion;Dry needling;Energy conservation;Splinting;Taping;Vasopneumatic Device    Consulted and Agree with Plan of Care  Patient       Patient will benefit from skilled therapeutic intervention in order to improve the following deficits and impairments:  Decreased endurance, Decreased scar mobility, Pain, Decreased strength, Decreased activity tolerance, Decreased balance, Decreased mobility, Difficulty walking, Improper body mechanics, Decreased range of motion, Impaired flexibility, Postural dysfunction  Visit Diagnosis: Pain in right leg  Muscle weakness (generalized)  Difficulty in walking, not elsewhere classified  Other symptoms and signs involving the  musculoskeletal system     Problem List Patient Active Problem List   Diagnosis Date Noted  . Neck abscess 07/10/2017  . Hypotension 07/10/2017  . Hyponatremia 07/10/2017  . FH: colon cancer 04/04/2017  . Cellulitis of breast   . Septic shock ([place])   . Breast cancer of upper-outer quadrant of left female breast ([place]) 01/03/2016  . 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 Spondylolisthesis of lumbar region 04/28/2013    Class: Chronic  . Dehydration 08/09/2011  . Hypertension 08/09/2011  . Hyperlipidemia LDL goal < 100 08/09/2011  . Lymphocytic colitis 08/09/2011  . TIA (transient ischemic attack) 08/09/2011  . ANXIETY 02/24/2010  . DEPRESSION 02/24/2010  . FLATULENCE-GAS-BLOATING 02/07/2008  . OTH&UNSPEC NONINFECTIOUS GASTROENTERITIS&COLITIS 12/12/2007  . DIARRHEA 10/10/2007  . ABDOMINAL PAIN, GENERALIZED 10/10/2007    Bess Harvest, PTA 12/31/17 5:59 PM   [facility] [address]  [place], [place], [address] Phone: [phone]   Fax:  [phone]  Name: [name] MRN: [id number] Date of Birth: Nov 26, 1960

## 2018-01-03 ENCOUNTER — Encounter: Payer: Self-pay | Admitting: Physical Therapy

## 2018-01-03 ENCOUNTER — Ambulatory Visit: Payer: Medicaid Other | Admitting: Physical Therapy

## 2018-01-03 DIAGNOSIS — R262 Difficulty in walking, not elsewhere classified: Secondary | ICD-10-CM

## 2018-01-03 DIAGNOSIS — R29898 Other symptoms and signs involving the musculoskeletal system: Secondary | ICD-10-CM

## 2018-01-03 DIAGNOSIS — M79604 Pain in right leg: Secondary | ICD-10-CM

## 2018-01-03 DIAGNOSIS — M6281 Muscle weakness (generalized): Secondary | ICD-10-CM

## 2018-01-03 NOTE — Therapy (Addendum)
[facility] [address]  [place], [place], [address] Phone: [phone]   Fax:  [phone]  Physical Therapy Treatment  Patient Details  Name: [name] MRN: [id number] Date of Birth: 12/01/1960 Referring Provider: Phylliss Bob, MD   Encounter Date: 01/03/2018  PT End of Session - 01/03/18 1754    Visit Number  12    Number of Visits  15    Date for PT Re-Evaluation  01/14/18    Authorization Type  Medicaid    Authorization Time Period  07/22-09/01    Authorization - Visit Number  9    Authorization - Number of Visits  12    PT Start Time  7342    PT Stop Time  1752    PT Time Calculation (min)  59 min    Activity Tolerance  Patient tolerated treatment well;Patient limited by pain    Behavior During Therapy  [facility] for tasks assessed/performed       Past Medical History:  Diagnosis Date  . Anxiety   . Arthritis    lower back  . Breast cancer ([place])   . Breast cancer of upper-outer quadrant of left female breast ([place]) 01/03/2016  . Complication of anesthesia    hx. of combativeness as child; had severe migraine headache day after back surgery  . Cough due to ACE inhibitor 01/28/2016  . Depression   . Eczema    right hip  . High cholesterol   . History of radiation therapy 05/29/16- 07/14/16   Left Breast/ 50.4 Gy n 25 fractions, Left Breast Boost/ 10 Gy in 5 fractions.   . Hypertension    states under control with meds., has been on med. since age 67  . Lymphocytic colitis   . Migraines   . Personal history of radiation therapy   . PONV (postoperative nausea and vomiting)   . Sciatica   . Sinus drainage 01/28/2016  . Urinary urgency     Past Surgical History:  Procedure Laterality Date  . ANTERIOR LAT LUMBAR FUSION Left 07/11/2017   Procedure: LEFT EXTREME LATERAL INTERBODY FUSION AND INSTRUMENTATION AND ALLOGRAFT LUMBAR 1-2/2-3;  Surgeon: Phylliss Bob, MD;  Location: [facility];  Service:  Orthopedics;  Laterality: Left;  . BREAST BIOPSY    . BREAST LUMPECTOMY     left 2017  . BREAST LUMPECTOMY WITH NEEDLE LOCALIZATION AND AXILLARY SENTINEL LYMPH NODE BX Left 02/04/2016   Procedure: LEFT BREAST LUMPECTOMY WITH 2 NEEDLE LOCALIZATIONS AND LEFT AXILLARY SENTINEL LYMPH NODE BIOPSY;  Surgeon: [name], MD;  Location: [place];  Service: General;  Laterality: Left;  LEFT BREAST LUMPECTOMY WITH 2 NEEDLE LOCALIZATIONS AND LEFT AXILLARY SENTINEL LYMPH NODE BIOPSY  . [place]  . COLONOSCOPY     Hx: of  . CYSTO  09/29/2005   with anterior repair  . ENDOMETRIAL ABLATION  09/29/2005  . INCISION AND DRAINAGE ABSCESS Left 02/21/2016   Procedure: INCISION AND DRAINAGE ABSCESS;  Surgeon: Excell Seltzer, MD;  [facility];  Service: General;  Laterality: Left;  . INCONTINENCE SURGERY  09/29/2005  . KNEE ARTHROSCOPY W/ DEBRIDEMENT    . LUMBAR FUSION  04/28/2013   L3-4  . TONSILLECTOMY     age 22  . TUBAL LIGATION    . WISDOM TOOTH EXTRACTION     as a teenager    There were no vitals filed for this visit.  Subjective Assessment - 01/03/18 1653  Subjective  Reports she feels comfortable transitioning to home program. However still dealing with a lot of painful muscle knots and numbness in R foot. Notes improvement in being able to climb stairs reciprocally, walking quality.     Pertinent History  hx of breast CA in remission, L3-4 fusion 2014, hx of neck abcess with MRSA, HTN    Diagnostic tests  10/17/17 lumbar MRI: persistent stenosis L2-3, improved appearance L1-2    Patient Stated Goals  not to feel pain    Currently in Pain?  Yes    Pain Score  3     Pain Location  Buttocks   6-7/10 behind knee, 7-8/10 R foot   Pain Orientation  Right    Pain Descriptors / Indicators  Burning    Pain Type  Chronic pain         [facility] PT Assessment - 01/03/18 0001      Strength   Strength Assessment Site  Hip;Knee;Ankle    Right/Left Hip   Right;Left    Right Hip Flexion  4-/5    Right Hip ABduction  4+/5    Right Hip ADduction  4+/5    Left Hip Flexion  4/5    Left Hip ABduction  4+/5    Left Hip ADduction  4+/5    Right/Left Knee  Right;Left    Right Knee Flexion  4/5    Right Knee Extension  4+/5    Left Knee Flexion  5/5    Left Knee Extension  5/5    Right/Left Ankle  Right;Left    Right Ankle Dorsiflexion  4+/5    Right Ankle Plantar Flexion  4/5    Left Ankle Dorsiflexion  4+/5    Left Ankle Plantar Flexion  4+/5                   [facility] Adult PT Treatment/Exercise - 01/03/18 0001      Exercises   Exercises  Knee/Hip      Lumbar Exercises: Stretches   Passive Hamstring Stretch  Right;2 reps;30 seconds    Passive Hamstring Stretch Limitations  strap supine      Lumbar Exercises: Aerobic   Nustep  Lvl 4, 7 min       Knee/Hip Exercises: Stretches   Other Knee/Hip Stretches  supine R adductor stretch with strap 2x30"      Knee/Hip Exercises: Standing   Heel Raises  Both;1 set;Limitations;10 reps   at counter top   Other Standing Knee Exercises  R single leg heel raises- unable to tolerate    Other Standing Knee Exercises  B eccentric heel raises at UBE 10x      Knee/Hip Exercises: Seated   Other Seated Knee/Hip Exercises  B hip IR with ball b/w knees and yellow TB; x15      Knee/Hip Exercises: Sidelying   Hip ABduction  Strengthening;Right;Limitations;1 set;Left;15 reps    Hip ABduction Limitations  glute med bias    Hip ADduction  Strengthening;Right;Left;1 set;Limitations;15 reps    Clams  10x each side with red TB around knees      Manual Therapy   Manual Therapy  Soft tissue mobilization   pt with several tender trigger pts throughout   Soft tissue mobilization  IASTM to R medial gastroc, medial HS, and adductors   no bruising or petechiae noted   Myofascial Release  trigger pt release to R medial and lateral gastroc, medial and lateral HS, adductor  PT  Education - 01/03/18 1753    Education Details  consolidated HEP    Person(s) Educated  Patient    Methods  Explanation;Demonstration;Tactile cues;Verbal cues;Handout    Comprehension  Returned demonstration;Verbalized understanding       PT Short Term Goals - 01/03/18 1659      PT SHORT TERM GOAL #1   Title  Patient to be independent with initial HEP.    Time  2    Period  Weeks    Status  Achieved        PT Long Term Goals - 01/03/18 1659      PT LONG TERM GOAL #1   Title  Patient to be independent with advanced HEP.    Time  4    Period  Weeks    Status  Achieved      PT LONG TERM GOAL #2   Title  Patient to demonstrate >=4+/5 strength in B LEs.    Time  4    Period  Weeks    Status  Partially Met   demonstrated improvements in R hip abduction and L knee flexion strength     PT LONG TERM GOAL #3   Title  Patient to report tolerance of 30 min of standing without pain.    Time  4    Period  Weeks    Status  Not Met   reports 10-15 min of walking/standing before increase in pain     PT LONG TERM GOAL #4   Title  Patient to report tolerance of supine positioning for 2 hours to improve sleep tolerance.     Time  4    Period  Weeks    Status  Achieved      PT LONG TERM GOAL #5   Title  Patient to demonstrate step-through gait pattern with 1 handrail and no evidence of instability up 13 steps.     Time  4    Period  Weeks    Status  Partially Met   Pt. able to perform step-over-step with min rail use with only mild R quad instability           Plan - 01/03/18 1806    Clinical Impression Statement  Patient arrived to session with no new complaints. Reports improvement in walking quality, ability to climb stairs reciprocally, and ability to walk through grocery store without increase in pain. Patient agreeable to 30 day hold at this time. Updated goals- patient has demonstrated improvements in R hip abduction and L knee flexion strength. Patient has been  compliant with HEP throughout [place]. Able to tolerate supine positioning at this time, however still limited in standing/walking tolerance.  Focused this session on manual trigger point release and IASTM to R gastroc, hamstring, and adductor. Patient with multiple tender trigger points throughout and report of mild relief after manual therapy. Patient tolerated all progressive LE strengthening exercises without c/o pain. Attempted single leg heel raise which patient could not tolerate- better tolerance of eccentric heel raises on step. Educated patient on consolidated HEP handout for transition to HEP and encouraged patient to participate in incremental walking program administered last session. Patient agreeable. Patient to be placed on 30 day hold at this time.     PT Treatment/Interventions  ADLs/Self Care Home Management;Cryotherapy;Electrical Stimulation;Moist Heat;Gait training;Stair training;Functional mobility training;Therapeutic activities;Therapeutic exercise;Manual techniques;Patient/family education;Neuromuscular re-education;Balance training;Scar mobilization;Passive range of motion;Dry needling;Energy conservation;Splinting;Taping;Vasopneumatic Device    PT Next Visit Plan  30 day hold at this time  Consulted and Agree with Plan of Care  Patient       Patient will benefit from skilled therapeutic intervention in order to improve the following deficits and impairments:  Decreased endurance, Decreased scar mobility, Pain, Decreased strength, Decreased activity tolerance, Decreased balance, Decreased mobility, Difficulty walking, Improper body mechanics, Decreased range of motion, Impaired flexibility, Postural dysfunction  Visit Diagnosis: Pain in right leg  Muscle weakness (generalized)  Difficulty in walking, not elsewhere classified  Other symptoms and signs involving the musculoskeletal system     Problem List Patient Active Problem List   Diagnosis Date Noted  . Neck abscess  07/10/2017  . Hypotension 07/10/2017  . Hyponatremia 07/10/2017  . FH: colon cancer 04/04/2017  . Cellulitis of breast   . Septic shock ([place])   . Breast cancer of upper-outer quadrant of left female breast ([place]) 01/03/2016  . 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 Spondylolisthesis of lumbar region 04/28/2013    Class: Chronic  . Dehydration 08/09/2011  . Hypertension 08/09/2011  . Hyperlipidemia LDL goal < 100 08/09/2011  . Lymphocytic colitis 08/09/2011  . TIA (transient ischemic attack) 08/09/2011  . ANXIETY 02/24/2010  . DEPRESSION 02/24/2010  . FLATULENCE-GAS-BLOATING 02/07/2008  . OTH&UNSPEC NONINFECTIOUS GASTROENTERITIS&COLITIS 12/12/2007  . DIARRHEA 10/10/2007  . ABDOMINAL PAIN, GENERALIZED 10/10/2007     Janene Harvey, PT, DPT 01/03/18 6:12 PM   [facility] [address]  [place], [place], [address] Phone: [phone]   Fax:  [phone]  Name: [name] MRN: [id number] Date of Birth: February 10, 1961  PHYSICAL THERAPY DISCHARGE SUMMARY  Visits from Start of Care: 12  Current functional level related to goals / functional outcomes: See above clinical summary   Remaining deficits: LE weakness and limited walking tolerance   Education / Equipment: HEP  Plan: Patient agrees to discharge.  Patient goals were partially met. Patient is being discharged due to lack of progress.  ?????     Janene Harvey, PT, DPT 02/04/18 12:25 PM

## 2018-01-21 ENCOUNTER — Encounter (HOSPITAL_COMMUNITY): Payer: Self-pay | Admitting: *Deleted

## 2018-01-31 ENCOUNTER — Other Ambulatory Visit: Payer: Self-pay

## 2018-01-31 DIAGNOSIS — M7989 Other specified soft tissue disorders: Secondary | ICD-10-CM

## 2018-01-31 NOTE — Progress Notes (Signed)
Called to return pt call regarding swelling on her left knee to ankle. Pt states that her legs have been swelling for months now and she is starting to have pain when ambulating. Pt has hx of HTN and TIA in the past. Wanted to know if it is related to hormonal therapy. Told pt that letrozole can sometimes cause some leg swelling but concern more to know if a dvt could be a possibility. Discussed symptoms with Dr.Gudena and obtained order for vascular doppler of L leg. Will call pt for updated appt. Pt verbalized understanding.

## 2018-02-05 ENCOUNTER — Ambulatory Visit (HOSPITAL_COMMUNITY)
Admission: RE | Admit: 2018-02-05 | Discharge: 2018-02-05 | Disposition: A | Discharge status: Home / Self Care | Payer: Medicaid Other | Source: Ambulatory Visit | Attending: Hematology and Oncology | Admitting: Hematology and Oncology

## 2018-02-05 DIAGNOSIS — M7989 Other specified soft tissue disorders: Secondary | ICD-10-CM | POA: Insufficient documentation

## 2018-02-05 NOTE — Progress Notes (Signed)
Left lower extremity venous duplex completed - Preliminary results - There is no evidence of a DVT or Baker's cyst. Toma Copier, RVS 02/05/2018, 3:49 PM

## 2018-05-10 ENCOUNTER — Other Ambulatory Visit: Payer: Self-pay | Admitting: Gastroenterology

## 2018-05-30 ENCOUNTER — Telehealth: Payer: Self-pay | Admitting: Hematology and Oncology

## 2018-05-30 NOTE — Telephone Encounter (Signed)
Dr. Lindi Adie PAL, moved 02/13 to 06/24/18. Spoke with patient.

## 2018-06-20 ENCOUNTER — Ambulatory Visit: Payer: Medicaid Other | Admitting: Hematology and Oncology

## 2018-06-24 ENCOUNTER — Telehealth: Payer: Self-pay | Admitting: Hematology and Oncology

## 2018-06-24 ENCOUNTER — Inpatient Hospital Stay: Payer: Medicaid Other | Attending: Hematology and Oncology | Admitting: Hematology and Oncology

## 2018-06-24 DIAGNOSIS — C50412 Malignant neoplasm of upper-outer quadrant of left female breast: Secondary | ICD-10-CM | POA: Diagnosis present

## 2018-06-24 DIAGNOSIS — M5126 Other intervertebral disc displacement, lumbar region: Secondary | ICD-10-CM

## 2018-06-24 DIAGNOSIS — Z79811 Long term (current) use of aromatase inhibitors: Secondary | ICD-10-CM | POA: Diagnosis not present

## 2018-06-24 DIAGNOSIS — N61 Mastitis without abscess: Secondary | ICD-10-CM

## 2018-06-24 DIAGNOSIS — Z170 Estrogen receptor positive status [ER+]: Secondary | ICD-10-CM | POA: Insufficient documentation

## 2018-06-24 DIAGNOSIS — Z79899 Other long term (current) drug therapy: Secondary | ICD-10-CM | POA: Diagnosis not present

## 2018-06-24 DIAGNOSIS — M48061 Spinal stenosis, lumbar region without neurogenic claudication: Secondary | ICD-10-CM

## 2018-06-24 DIAGNOSIS — Z923 Personal history of irradiation: Secondary | ICD-10-CM | POA: Diagnosis not present

## 2018-06-24 DIAGNOSIS — K5289 Other specified noninfective gastroenteritis and colitis: Secondary | ICD-10-CM

## 2018-06-24 DIAGNOSIS — N951 Menopausal and female climacteric states: Secondary | ICD-10-CM | POA: Diagnosis not present

## 2018-06-24 DIAGNOSIS — N923 Ovulation bleeding: Secondary | ICD-10-CM

## 2018-06-24 DIAGNOSIS — M79604 Pain in right leg: Secondary | ICD-10-CM

## 2018-06-24 MED ORDER — KETOCONAZOLE 2 % EX CREA: 1.0000 "application " | TOPICAL_CREAM | Freq: Every day | CUTANEOUS | 0 refills | Status: DC

## 2018-06-24 MED ORDER — LETROZOLE 2.5 MG PO TABS: 2.5000 mg | ORAL_TABLET | Freq: Every day | ORAL | 3 refills | Status: DC

## 2018-06-24 NOTE — Progress Notes (Signed)
Patient Care Team: [name], MD as PCP - General (Family Medicine) [name], MD as Consulting Physician (General Surgery) Nicholas Lose, MD as Consulting Physician (Hematology and Oncology) Eppie Gibson, MD as Attending Physician (Radiation Oncology) Gardenia Phlegm, NP as Nurse Practitioner (Hematology and Oncology)  DIAGNOSIS:  Encounter Diagnosis  Name Primary?  . Malignant neoplasm of upper-outer quadrant of left breast in female, estrogen receptor positive ([place])     SUMMARY OF ONCOLOGIC HISTORY:   Breast cancer of upper-outer quadrant of left female breast ([place])   12/23/2015 Mammogram    Left breast 1.2 cm spiculated mass 2:00 position, 5 mm intramammary lymph node 2:00 position needs biopsy, 6 mm simple cyst at 1:00 position, T1c Nx stage IA    12/29/2015 Initial Diagnosis    Left breast biopsy UOQ 2:00 and centimeters from nipple: IDC, grade 1, ER 100%, PR 90%, Ki-67 10%, HER-2 negative ratio 1.16    02/04/2016 Surgery    Left lumpectomy: IDC grade 1, 1.1 cm, ADH, ALH,0/3 lymph nodes negative, margins negative,ER 100%, PR 90%, HER-2 negative ratio 1.16, Ki-67 10%, T1 CN 0 stage IA    02/04/2016 Oncotype testing    Oncotype DX score 13, risk of recurrence 9% with tamoxifen alone    05/29/2016 - 07/14/2016 Radiation Therapy    Adjuvant radiation therapy Isidore Moos): 1) Left Breast / 50.4 Gy in 25 fractions.  2) Left Breast Boost / 10 Gy in 5 fractions    09/2016 -  Anti-estrogen oral therapy    Letrozole daily     CHIEF COMPLIANT: Follow-up on letrozole therapy  INTERVAL HISTORY: [name] is a 58 year old above-mentioned history of left breast cancer underwent lumpectomy radiation is currently on letrozole.  She is tolerating letrozole extremely well.  She does have occasional hot flashes.  She has multiple pains especially in the right leg.  She does not attribute the symptoms to antiestrogen therapy.  She has had chronic back surgeries.  She also has  colitis which flares up once in a while.  REVIEW OF SYSTEMS:   Constitutional: Denies fevers, chills or abnormal weight loss Eyes: Denies blurriness of vision Ears, nose, mouth, throat, and face: Denies mucositis or sore throat Respiratory: Denies cough, dyspnea or wheezes Cardiovascular: Denies palpitation, chest discomfort Gastrointestinal:  Denies nausea, heartburn or change in bowel habits Skin: Denies abnormal skin rashes Lymphatics: Denies new lymphadenopathy or easy bruising Neurological:Denies numbness, tingling or new weaknesses Behavioral/Psych: Mood is stable, no new changes  Extremities: Right leg pain Breast:  denies any pain or lumps or nodules in either breasts All other systems were reviewed with the patient and are negative.  I have reviewed the past medical history, past surgical history, social history and family history with the patient and they are unchanged from previous note.  ALLERGIES:  is allergic to nsaids; soap; adhesive [tape]; augmentin [amoxicillin-pot clavulanate]; latex; phenergan [promethazine hcl]; chlorhexidine gluconate; cymbalta [duloxetine hcl]; mupirocin; and oxycodone.  MEDICATIONS:  Current Outpatient Medications  Medication Sig Dispense Refill  . amitriptyline (ELAVIL) 25 MG tablet Take 25 mg by mouth at bedtime.    Marland Kitchen amLODipine (NORVASC) 5 MG tablet Take 5 mg by mouth daily.    Marland Kitchen atorvastatin (LIPITOR) 20 MG tablet Take 20 mg by mouth daily.    . budesonide (ENTOCORT EC) 3 MG 24 hr capsule Take 3 capsules (9 mg total) by mouth daily. 90 capsule 0  . cholecalciferol (VITAMIN D) 1000 units tablet Take 1,000 Units by mouth daily.    Marland Kitchen  letrozole (FEMARA) 2.5 MG tablet Take 1 tablet (2.5 mg total) by mouth daily. 30 tablet 5  . losartan-hydrochlorothiazide (HYZAAR) 100-12.5 MG tablet Take 1 tablet by mouth daily.    . Multiple Vitamins-Minerals (MULTIVITAMIN ADULT PO) Take 1 tablet by mouth daily.     . ondansetron (ZOFRAN) 4 MG tablet Take 1  tablet (4 mg total) by mouth daily as needed for nausea or vomiting. 30 tablet 1  . Probiotic Product (PROBIOTIC DAILY PO) Take 1 capsule by mouth daily.     . tizanidine (ZANAFLEX) 2 MG capsule Take 2 mg by mouth 3 (three) times daily.     Current Facility-Administered Medications  Medication Dose Route Frequency Provider Last Rate Last Dose  . 0.9 %  sodium chloride infusion  500 mL Intravenous Once Ladene Artist, MD        PHYSICAL EXAMINATION: ECOG PERFORMANCE STATUS: 1 - Symptomatic but completely ambulatory  Vitals:   06/24/18 1529  BP: (!) 114/54  Pulse: (!) 103  Resp: 17  Temp: 98.3 F (36.8 C)  SpO2: 99%   Filed Weights   06/24/18 1529  Weight: 240 lb 1.6 oz (108.9 kg)    GENERAL:alert, no distress and comfortable SKIN: skin color, texture, turgor are normal, no rashes or significant lesions EYES: normal, Conjunctiva are pink and non-injected, sclera clear OROPHARYNX:no exudate, no erythema and lips, buccal mucosa, and tongue normal  NECK: supple, thyroid normal size, non-tender, without nodularity LYMPH:  no palpable lymphadenopathy in the cervical, axillary or inguinal LUNGS: clear to auscultation and percussion with normal breathing effort HEART: regular rate & rhythm and no murmurs and no lower extremity edema ABDOMEN:abdomen soft, non-tender and normal bowel sounds MUSCULOSKELETAL:no cyanosis of digits and no clubbing  NEURO: alert & oriented x 3 with fluent speech, no focal motor/sensory deficits EXTREMITIES: No lower extremity edema BREAST: No palpable masses or nodules in either right or left breasts. No palpable axillary supraclavicular or infraclavicular adenopathy no breast tenderness or nipple discharge. (exam performed in the presence of a chaperone)  LABORATORY DATA:  I have reviewed the data as listed CMP Latest Ref Rng & Units 07/12/2017 07/10/2017 02/23/2016  Glucose 65 - 99 mg/dL 132(H) 130(H) 230(H)  BUN 6 - 20 mg/dL _0 Creatinine 0.44 -  1.00 mg/dL 0.97 1.08(H) 0.86  Sodium 135 - 145 mmol/L 137 132(L) 137  Potassium 3.5 - 5.1 mmol/L 4.5 4.0 4.7  Chloride 101 - 111 mmol/L 103 96(L) 108  CO2 22 - 32 mmol/L _1 Calcium 8.9 - 10.3 mg/dL 8.3(L) 9.1 8.6(L)  Total Protein 6.5 - 8.1 g/dL - 6.0(L) -  Total Bilirubin 0.3 - 1.2 mg/dL - 0.3 -  Alkaline Phos 38 - 126 U/L - 105 -  AST 15 - 41 U/L - 31 -  ALT 14 - 54 U/L - 43 -    Lab Results  Component Value Date   WBC 6.9 07/12/2017   HGB 12.4 07/12/2017   HCT 38.8 07/12/2017   MCV 93.0 07/12/2017   PLT 170 07/12/2017   NEUTROABS 19.2 (H) 02/20/2016    ASSESSMENT & PLAN:  Breast cancer of upper-outer quadrant of left female breast ([place]) 12/23/2015: Left breast 1.2 cm spiculated mass 2:00 position, 5 mm intramammary lymph node 2:00 position needs biopsy, 6 mm simple cyst at 1:00 position, T1c Nx stage IA 12/29/2015: Left breast biopsy UOQ 2:00 and centimeters from nipple: IDC, grade 1, ER 100%, PR 90%, Ki-67 10%, HER-2 negative ratio 1.16 02/04/2016:  Left lumpectomy: IDC grade 1, 1.1 cm, ADH, ALH,0/3 lymph nodes negative, margins negative,ER 100%, PR 90%, HER-2 negative ratio 1.16, Ki-67 10%, T1 CN 0 stage IA Oncotype DX score 13, risk of recurrence 9% with tamoxifen alone Adjuvant radiation therapy: 05/26/2016- 07/06/2016 ----------------------------------------------------------------------------------------------------------------------------------------------------- Treatment plan: Adjuvant antiestrogen therapy with letrozole 2.5 mg dailystarted May 2018 Letrozoletoxicities: 1.Hot flashes: Stable  Multiple back issues: MRI spine 10/17/2017: L2-L3 stenosis  Severe low back pain related to herniated disc follows with orthopedic surgery. Right leg pain Patient has chronic colitis  Breast cancer surveillance: 1.  Mammograms 01/22/2017: Benign, she will need a new mammogram since she has not done in 2019.  I emphasized importance of mammograms and I reordered her  mammogram to be done as soon as possible. 2. breast exam 06/24/2018: Benign, exam was performed by Mendel Ryder, scar tissue from prior surgery Fungal infection under breasts: Ketoconazole was prescribed  Return to clinic in 1 year for follow-up    Orders Placed This Encounter  Procedures  . MM DIAG BREAST TOMO BILATERAL    Standing Status:   Future    Standing Expiration Date:   06/25/2019    Order Specific Question:   Reason for Exam (SYMPTOM  OR DIAGNOSIS REQUIRED)    Answer:   Annual mammograms for breast cancer    Order Specific Question:   Is the patient pregnant?    Answer:   No    Order Specific Question:   Preferred imaging location?    Answer:   [facility]   The patient has a good understanding of the overall plan. she agrees with it. she will call with any problems that may develop before the next visit here.   Harriette Ohara, MD 06/24/18

## 2018-06-24 NOTE — Telephone Encounter (Signed)
Gave avs and calendar ° °

## 2018-06-24 NOTE — Assessment & Plan Note (Signed)
12/23/2015: Left breast 1.2 cm spiculated mass 2:00 position, 5 mm intramammary lymph node 2:00 position needs biopsy, 6 mm simple cyst at 1:00 position, T1c Nx stage IA 12/29/2015: Left breast biopsy UOQ 2:00 and centimeters from nipple: IDC, grade 1, ER 100%, PR 90%, Ki-67 10%, HER-2 negative ratio 1.16 02/04/2016: Left lumpectomy: IDC grade 1, 1.1 cm, ADH, ALH,0/3 lymph nodes negative, margins negative,ER 100%, PR 90%, HER-2 negative ratio 1.16, Ki-67 10%, T1 CN 0 stage IA Oncotype DX score 13, risk of recurrence 9% with tamoxifen alone Adjuvant radiation therapy: 05/26/2016- 07/06/2016 ----------------------------------------------------------------------------------------------------------------------------------------------------- Treatment plan: Adjuvant antiestrogen therapy with letrozole 2.5 mg dailystarted May 2018 Letrozoletoxicities: 1.Hot flashes  Multiple back issues: MRI spine 10/17/2017: L2-L3 stenosis  Severe low back pain related to herniated disc follows with orthopedic surgery.  Breast cancer surveillance: 1.  Mammograms 01/22/2017: Benign, she will need a new mammogram since she has not done 1 in 2019. 2. breast exam 06/24/2018: Benign  Return to clinic in 1 year for follow-up

## 2018-07-08 ENCOUNTER — Ambulatory Visit
Admission: RE | Admit: 2018-07-08 | Discharge: 2018-07-08 | Disposition: A | Discharge status: Home / Self Care | Payer: Medicaid Other | Source: Ambulatory Visit | Attending: Hematology and Oncology | Admitting: Hematology and Oncology

## 2018-07-08 DIAGNOSIS — Z170 Estrogen receptor positive status [ER+]: Principal | ICD-10-CM

## 2018-07-08 DIAGNOSIS — C50412 Malignant neoplasm of upper-outer quadrant of left female breast: Secondary | ICD-10-CM

## 2018-10-30 ENCOUNTER — Other Ambulatory Visit: Payer: Self-pay

## 2018-10-30 ENCOUNTER — Emergency Department (HOSPITAL_COMMUNITY)
Admission: EM | Admit: 2018-10-30 | Discharge: 2018-10-30 | Disposition: A | Discharge status: Home / Self Care | Payer: Medicaid Other | Attending: Emergency Medicine | Admitting: Emergency Medicine

## 2018-10-30 ENCOUNTER — Encounter (HOSPITAL_COMMUNITY): Payer: Self-pay

## 2018-10-30 ENCOUNTER — Telehealth: Payer: Self-pay | Admitting: Gastroenterology

## 2018-10-30 DIAGNOSIS — R197 Diarrhea, unspecified: Secondary | ICD-10-CM | POA: Diagnosis present

## 2018-10-30 DIAGNOSIS — R509 Fever, unspecified: Secondary | ICD-10-CM | POA: Insufficient documentation

## 2018-10-30 DIAGNOSIS — Z5321 Procedure and treatment not carried out due to patient leaving prior to being seen by health care provider: Secondary | ICD-10-CM | POA: Diagnosis not present

## 2018-10-30 MED ORDER — BUDESONIDE 3 MG PO CPEP: 9.0000 mg | ORAL_CAPSULE | Freq: Every day | ORAL | 0 refills | Status: DC

## 2018-10-30 NOTE — Telephone Encounter (Signed)
Pt is scheduled for a virtual visit 7/22 and requested a refill for budesonide.

## 2018-10-30 NOTE — ED Notes (Signed)
No answer for lab draw at this time.

## 2018-10-30 NOTE — Telephone Encounter (Signed)
Informed patient one month has been sent to her pharmacy and to keep her appt in July. Patient verbalized understanding.

## 2018-10-30 NOTE — ED Notes (Signed)
Called for room no answer

## 2018-10-30 NOTE — Telephone Encounter (Signed)
Refill for 1 month.

## 2018-10-30 NOTE — ED Triage Notes (Signed)
Pt states she has had a low grade fever and her boss wants her tested for covid. Lithocytic colitis and out of medicine. Pt states she has had bad diarrhea she believes from that. Meds are waiting at pharmacy Pt has had fever and diarrhea x 3 days Last tylenol at 10am

## 2018-10-30 NOTE — Telephone Encounter (Signed)
Informed patient according to her chart her last refill of budesonide was in January of this year. Asked patient if she is taking it daily. Patient states she only takes it as needed and lately she has needed to take it every day and no longer has any medicine. Informed patient that she has not been seen since 2018 and its very important to be seen every year while taking a medication like this one. Patient scheduled appt for 11/27/18. Can I refill budesonide until scheduled appt Dr. Fuller Plan?

## 2018-11-26 ENCOUNTER — Other Ambulatory Visit: Payer: Self-pay

## 2018-11-27 ENCOUNTER — Ambulatory Visit (INDEPENDENT_AMBULATORY_CARE_PROVIDER_SITE_OTHER): Payer: Medicaid Other | Admitting: Gastroenterology

## 2018-11-27 ENCOUNTER — Encounter: Payer: Self-pay | Admitting: Gastroenterology

## 2018-11-27 VITALS — Ht 67.0 in | Wt 200.0 lb

## 2018-11-27 DIAGNOSIS — K52832 Lymphocytic colitis: Secondary | ICD-10-CM | POA: Diagnosis not present

## 2018-11-27 DIAGNOSIS — Z800 Family history of malignant neoplasm of digestive organs: Secondary | ICD-10-CM | POA: Diagnosis not present

## 2018-11-27 MED ORDER — BUDESONIDE 3 MG PO CPEP: 9.0000 mg | ORAL_CAPSULE | Freq: Every day | ORAL | 11 refills | Status: DC

## 2018-11-27 NOTE — Patient Instructions (Signed)
We have sent the following medications to your pharmacy for you to pick up at your convenience: Budesonide 9 mg daily but can taper to 3mg  or 6 mg daily to maintain control of diarrhea.   Thank you for choosing me and [facility].  Pricilla Riffle. Dagoberto Ligas., MD., Marval Regal

## 2018-11-27 NOTE — Progress Notes (Signed)
    History of Present Illness: This is a 58 year old female with lymphocytic colitis.  Upon running out of budesonide she had a return and diarrhea.  She generally manages her diarrhea with 3 to 6 mg daily of budesonide.  9 mg daily as needed to gain initial control of her symptoms.  She has no other GI complaints.  Last colonoscopy was a January 2019 showing only internal hemorrhoids.  Current Medications, Allergies, Past Medical History, Past Surgical History, Family History and Social History were reviewed in [employer] record.   Physical Exam: Telemedicine - not performed   Assessment and Recommendations:  1.  Lymphocytic colitis.  Refill budesonide 9 mg po daily for 1 year.  Patient may taper to 3 mg or 6 mg daily to maintain control of diarrhea. REV in 1 year.   2.  Family history of colon cancer, first-degree relative (father).  A 5-year interval screening colonoscopy is recommended in January 2024.    These services were provided via telemedicine, audio and video.  The patient was at home and the provider was in the office, alone.  We discussed the limitations of evaluation and management by telemedicine and the availability of in person appointments.  Patient consented for this telemedicine visit and is aware of possible charges for this service.  Office CMA or LPN participated in this telemedicine service.  Time spent on call: 8 minutes

## 2018-12-10 ENCOUNTER — Encounter (HOSPITAL_COMMUNITY): Payer: Self-pay | Admitting: Emergency Medicine

## 2018-12-10 ENCOUNTER — Emergency Department (HOSPITAL_COMMUNITY)
Admission: EM | Admit: 2018-12-10 | Discharge: 2018-12-10 | Disposition: A | Discharge status: Home / Self Care | Payer: Medicaid Other | Attending: Emergency Medicine | Admitting: Emergency Medicine

## 2018-12-10 ENCOUNTER — Emergency Department (HOSPITAL_COMMUNITY): Payer: Medicaid Other

## 2018-12-10 DIAGNOSIS — L03317 Cellulitis of buttock: Secondary | ICD-10-CM | POA: Insufficient documentation

## 2018-12-10 DIAGNOSIS — Z79899 Other long term (current) drug therapy: Secondary | ICD-10-CM | POA: Insufficient documentation

## 2018-12-10 DIAGNOSIS — K6289 Other specified diseases of anus and rectum: Secondary | ICD-10-CM | POA: Diagnosis present

## 2018-12-10 DIAGNOSIS — Z91040 Latex allergy status: Secondary | ICD-10-CM | POA: Insufficient documentation

## 2018-12-10 DIAGNOSIS — L98412 Non-pressure chronic ulcer of buttock with fat layer exposed: Secondary | ICD-10-CM | POA: Diagnosis not present

## 2018-12-10 DIAGNOSIS — Z853 Personal history of malignant neoplasm of breast: Secondary | ICD-10-CM | POA: Insufficient documentation

## 2018-12-10 DIAGNOSIS — I10 Essential (primary) hypertension: Secondary | ICD-10-CM | POA: Diagnosis not present

## 2018-12-10 DIAGNOSIS — F17210 Nicotine dependence, cigarettes, uncomplicated: Secondary | ICD-10-CM | POA: Diagnosis not present

## 2018-12-10 DIAGNOSIS — L98492 Non-pressure chronic ulcer of skin of other sites with fat layer exposed: Secondary | ICD-10-CM

## 2018-12-10 DIAGNOSIS — Z8673 Personal history of transient ischemic attack (TIA), and cerebral infarction without residual deficits: Secondary | ICD-10-CM | POA: Insufficient documentation

## 2018-12-10 LAB — CBC WITH DIFFERENTIAL/PLATELET
Abs Immature Granulocytes: 0.59 10*3/uL — ABNORMAL HIGH (ref 0.00–0.07)
Basophils Absolute: 0.1 10*3/uL (ref 0.0–0.1)
Basophils Relative: 1 %
Eosinophils Absolute: 0.3 10*3/uL (ref 0.0–0.5)
Eosinophils Relative: 2 %
HCT: 41.8 % (ref 36.0–46.0)
Hemoglobin: 14.1 g/dL (ref 12.0–15.0)
Immature Granulocytes: 4 %
Lymphocytes Relative: 23 %
Lymphs Abs: 3.1 10*3/uL (ref 0.7–4.0)
MCH: 30.3 pg (ref 26.0–34.0)
MCHC: 33.7 g/dL (ref 30.0–36.0)
MCV: 89.7 fL (ref 80.0–100.0)
Monocytes Absolute: 0.8 10*3/uL (ref 0.1–1.0)
Monocytes Relative: 6 %
Neutro Abs: 8.6 10*3/uL — ABNORMAL HIGH (ref 1.7–7.7)
Neutrophils Relative %: 64 %
Platelets: 290 10*3/uL (ref 150–400)
RBC: 4.66 MIL/uL (ref 3.87–5.11)
RDW: 13.1 % (ref 11.5–15.5)
WBC: 13.5 10*3/uL — ABNORMAL HIGH (ref 4.0–10.5)
nRBC: 0 % (ref 0.0–0.2)

## 2018-12-10 LAB — COMPREHENSIVE METABOLIC PANEL
ALT: 31 U/L (ref 0–44)
AST: 27 U/L (ref 15–41)
Albumin: 3.2 g/dL — ABNORMAL LOW (ref 3.5–5.0)
Alkaline Phosphatase: 136 U/L — ABNORMAL HIGH (ref 38–126)
Anion gap: 12 (ref 5–15)
BUN: 13 mg/dL (ref 6–20)
CO2: 25 mmol/L (ref 22–32)
Calcium: 8.7 mg/dL — ABNORMAL LOW (ref 8.9–10.3)
Chloride: 95 mmol/L — ABNORMAL LOW (ref 98–111)
Creatinine, Ser: 0.85 mg/dL (ref 0.44–1.00)
GFR calc Af Amer: >60 mL/min (ref >60)
GFR calc non Af Amer: >60 mL/min (ref >60)
Glucose, Bld: 343 mg/dL — ABNORMAL HIGH (ref 70–99)
Potassium: 2.5 mmol/L — CL (ref 3.5–5.1)
Sodium: 132 mmol/L — ABNORMAL LOW (ref 135–145)
Total Bilirubin: 0.4 mg/dL (ref 0.3–1.2)
Total Protein: 7 g/dL (ref 6.5–8.1)

## 2018-12-10 LAB — LACTIC ACID, PLASMA: Lactic Acid, Venous: 2 mmol/L (ref 0.5–1.9)

## 2018-12-10 MED ORDER — LIDOCAINE HCL 4 % EX SOLN
Freq: Once | CUTANEOUS | Status: AC
  Administered 2018-12-10: 21:00:00 via TOPICAL
  Filled 2018-12-10: qty 50

## 2018-12-10 MED ORDER — POTASSIUM CHLORIDE CRYS ER 20 MEQ PO TBCR
60.0000 meq | EXTENDED_RELEASE_TABLET | Freq: Once | ORAL | Status: AC
  Administered 2018-12-10: 60 meq via ORAL
  Filled 2018-12-10: qty 3

## 2018-12-10 MED ORDER — BUPIVACAINE HCL (PF) 0.5 % IJ SOLN
10.0000 mL | Freq: Once | INTRAMUSCULAR | Status: DC
  Filled 2018-12-10: qty 30

## 2018-12-10 NOTE — ED Triage Notes (Signed)
Patient here from home with complaints of rectal abscess. Reports that she was seen by PCP on Friday and given antibiotics. Worse today.

## 2018-12-10 NOTE — Discharge Instructions (Addendum)
Given ulcerated lesion of your buttock, with drainage.  We placed packing to allow for the ulcerated site to drain out.  We would want you to follow-up with your doctor in 2 days for reassessment of the wound.  If there is no more draining at that time then they can focus on pain control and infection control with topical meds.

## 2018-12-10 NOTE — ED Notes (Signed)
Date and time results received: 12/10/18 2200  Test: Lactic Acid  Critical Value: 2.0  Name of Provider Notified: Dr. Dollene Cleveland   Orders Received? Or Actions Taken?: Will continue to monitor and await for new orders.

## 2018-12-10 NOTE — ED Notes (Signed)
Date and time results received: 12/10/18 22:00  Test: Potassium  Critical Value: 2.5   Name of Provider Notified: Dr. Dollene Cleveland   Orders Received? Or Actions Taken?: Will continue to monitor patient and await new orders.

## 2018-12-10 NOTE — ED Provider Notes (Signed)
[facility] DEPT Provider Note   CSN: [id number] Arrival date & time: 12/10/18  1900    History   Chief Complaint Chief Complaint  Patient presents with   Abscess   Rectal Pain    HPI [name] is a 58 y.o. female.     HPI  58 year old female comes in today following her right leg pain.  Reports that she has been having some left gluteal region for 3 days.  She saw her PCP started on Bactrim.  She started having increasing pain followed by drainage from the gluteal region.  She went to an urgent care and was advised to come to the ER for CT scan to ensure there is no abscess.  Patient has no nausea, vomiting, fevers, chills.  Past Medical History:  Diagnosis Date   Anxiety    Arthritis    lower back   Breast cancer ([place])    Breast cancer of upper-outer quadrant of left female breast ([place]) 11/08/5007   Complication of anesthesia    hx. of combativeness as child; had severe migraine headache day after back surgery   Cough due to ACE inhibitor 01/28/2016   Depression    Eczema    right hip   High cholesterol    History of radiation therapy 05/29/16- 07/14/16   Left Breast/ 50.4 Gy n 25 fractions, Left Breast Boost/ 10 Gy in 5 fractions.    Hypertension    states under control with meds., has been on med. since age 81   Lymphocytic colitis    Migraines    Personal history of radiation therapy    PONV (postoperative nausea and vomiting)    Sciatica    Sinus drainage 01/28/2016   Urinary urgency     Patient Active Problem List   Diagnosis Date Noted   Neck abscess 07/10/2017   Hypotension 07/10/2017   Hyponatremia 07/10/2017   FH: colon cancer 04/04/2017   Cellulitis of breast    Septic shock (HCC)    Breast cancer of upper-outer quadrant of left female breast ([place]) 01/03/2016   Spinal stenosis, lumbar region, with neurogenic claudication 04/28/2013    Class: Chronic   Spondylolisthesis of lumbar  region 04/28/2013    Class: Chronic   Dehydration 08/09/2011   Hypertension 08/09/2011   Hyperlipidemia LDL goal < 100 08/09/2011   Lymphocytic colitis 08/09/2011   TIA (transient ischemic attack) 08/09/2011   ANXIETY 02/24/2010   DEPRESSION 02/24/2010   FLATULENCE-GAS-BLOATING 02/07/2008   OTH&UNSPEC NONINFECTIOUS GASTROENTERITIS&COLITIS 12/12/2007   DIARRHEA 10/10/2007   ABDOMINAL PAIN, GENERALIZED 10/10/2007    Past Surgical History:  Procedure Laterality Date   ANTERIOR LAT LUMBAR FUSION Left 07/11/2017   Procedure: LEFT EXTREME LATERAL INTERBODY FUSION AND INSTRUMENTATION AND ALLOGRAFT LUMBAR 1-2/2-3;  Surgeon: Phylliss Bob, MD;  Location: [facility];  Service: Orthopedics;  Laterality: Left;   BREAST BIOPSY     BREAST LUMPECTOMY     left 2017   BREAST LUMPECTOMY WITH NEEDLE LOCALIZATION AND AXILLARY SENTINEL LYMPH NODE BX Left 02/04/2016   Procedure: LEFT BREAST LUMPECTOMY WITH 2 NEEDLE LOCALIZATIONS AND LEFT AXILLARY SENTINEL LYMPH NODE BIOPSY;  Surgeon: [name], MD;  Location: [facility];  Service: General;  Laterality: Left;  LEFT BREAST LUMPECTOMY WITH 2 NEEDLE LOCALIZATIONS AND LEFT AXILLARY [place], 1997   COLONOSCOPY     Hx: of   CYSTO  09/29/2005   with anterior repair   ENDOMETRIAL ABLATION  09/29/2005  INCISION AND DRAINAGE ABSCESS Left 02/21/2016   Procedure: INCISION AND DRAINAGE ABSCESS;  Surgeon: Excell Seltzer, MD;  Location: [facility];  Service: General;  Laterality: Left;   INCONTINENCE SURGERY  09/29/2005   KNEE ARTHROSCOPY W/ DEBRIDEMENT     LUMBAR FUSION  04/28/2013   L3-4   TONSILLECTOMY     age 61   TUBAL LIGATION     WISDOM TOOTH EXTRACTION     as a teenager     OB History    Gravida  3   Para  2   Term  2   Preterm      AB  1   Living  2     SAB  1   TAB      Ectopic      Multiple      Live Births               Home Medications     Prior to Admission medications   Medication Sig Start Date End Date Taking? Authorizing Provider  amLODipine (NORVASC) 5 MG tablet Take 5 mg by mouth daily.   Yes [provider]  atorvastatin (LIPITOR) 20 MG tablet Take 20 mg by mouth at bedtime.    Yes [provider]  budesonide (ENTOCORT EC) 3 MG 24 hr capsule Take 3 capsules (9 mg total) by mouth daily. 11/27/18  Yes Ladene Artist, MD  gabapentin (NEURONTIN) 300 MG capsule Take 300 mg by mouth 2 (two) times daily at 10 AM and 5 PM. 03/18/18  Yes [provider]  letrozole (FEMARA) 2.5 MG tablet Take 1 tablet (2.5 mg total) by mouth daily. 06/24/18  Yes Nicholas Lose, MD  losartan-hydrochlorothiazide (HYZAAR) 100-12.5 MG tablet Take 1 tablet by mouth at bedtime.  10/16/16  Yes [provider]  ondansetron (ZOFRAN) 4 MG tablet Take 4 mg by mouth 3 (three) times daily as needed for nausea.  03/18/18  Yes [provider]  sertraline (ZOLOFT) 100 MG tablet Take 100 mg by mouth at bedtime.  08/04/18  Yes [provider]  sulfamethoxazole-trimethoprim (BACTRIM) 400-80 MG tablet Take 1 tablet by mouth 2 (two) times daily.   Yes [provider]    Family History Family History  Problem Relation Age of Onset   Colon cancer Father        56-65   Hypertension Father    Colon polyps Other    Diabetes Other    Breast cancer Paternal Aunt     Social History Social History   Tobacco Use   Smoking status: Current Every Day Smoker    Packs/day: 1.00    Years: 12.00    Pack years: 12.00    Types: Cigarettes   Smokeless tobacco: Never Used   Tobacco comment: Tobacco info given 04/04/17  Substance Use Topics   Alcohol use: No   Drug use: No     Allergies   Nsaids, Soap, Augmentin [amoxicillin-pot clavulanate], Latex, Tape, Bupropion, Duloxetine, Phenergan [promethazine hcl], Chlorhexidine gluconate, Cymbalta [duloxetine hcl], Mupirocin, and Oxycodone   Review of  Systems Review of Systems  Constitutional: Positive for appetite change.  Gastrointestinal: Negative for nausea and vomiting.  Skin: Positive for rash.  Allergic/Immunologic: Negative for immunocompromised state.  All other systems reviewed and are negative.    Physical Exam Updated Vital Signs BP (!) 129/94    Pulse 84    Temp 99.5 F (37.5 C) (Oral)    Resp 16    SpO2 100%  Physical Exam Vitals signs and nursing note reviewed.  Constitutional:      Appearance: She is well-developed.  HENT:     Head: Normocephalic and atraumatic.  Neck:     Musculoskeletal: Normal range of motion and neck supple.  Cardiovascular:     Rate and Rhythm: Normal rate.  Pulmonary:     Effort: Pulmonary effort is normal.  Abdominal:     General: Bowel sounds are normal.  Skin:    General: Skin is warm and dry.     Comments: Patient has a 1 cm ulcer for the left gluteal.  The ulcer is between area of the anus and labia.  There is mild induration.  No purulent drainage.  Neurological:     Mental Status: She is alert and oriented to person, place, and time.      ED Treatments / Results  Labs (all labs ordered are listed, but only abnormal results are displayed) Labs Reviewed  LACTIC ACID, PLASMA - Abnormal; Notable for the following components:      Result Value   Lactic Acid, Venous 2.0 (*)    All other components within normal limits  COMPREHENSIVE METABOLIC PANEL - Abnormal; Notable for the following components:   Sodium 132 (*)    Potassium 2.5 (*)    Chloride 95 (*)    Glucose, Bld 343 (*)    Calcium 8.7 (*)    Albumin 3.2 (*)    Alkaline Phosphatase 136 (*)    All other components within normal limits  CBC WITH DIFFERENTIAL/PLATELET - Abnormal; Notable for the following components:   WBC 13.5 (*)    Neutro Abs 8.6 (*)    Abs Immature Granulocytes 0.59 (*)    All other components within normal limits  LACTIC ACID, PLASMA  URINALYSIS, ROUTINE W REFLEX MICROSCOPIC     EKG None  Radiology Ct Abdomen Pelvis Wo Contrast  Result Date: 12/10/2018 CLINICAL DATA:  Perianal skin ulceration EXAM: CT ABDOMEN AND PELVIS WITHOUT CONTRAST TECHNIQUE: Multidetector CT imaging of the abdomen and pelvis was performed following the standard protocol without IV contrast. COMPARISON:  07/10/2017 FINDINGS: Lower chest: No acute abnormality. Hepatobiliary: Diffuse fatty infiltration of the liver is noted. The gallbladder is within normal limits. Pancreas: Unremarkable. No pancreatic ductal dilatation or surrounding inflammatory changes. Spleen: Normal in size without focal abnormality. Adrenals/Urinary Tract: Adrenal glands are within normal limits bilaterally. Tiny nonobstructing left renal stone is noted in the lower pole. No obstructive changes are noted bilaterally. The bladder is partially distended. Stomach/Bowel: Colon is predominately decompressed. No inflammatory or obstructive changes are seen. The appendix is not well visualized. No inflammatory changes to suggest appendicitis are noted. Small bowel is within normal limits. Stomach is decompressed. Vascular/Lymphatic: Aortic atherosclerosis. No enlarged abdominal or pelvic lymph nodes. Reproductive: Uterus and bilateral adnexa are unremarkable. Other: Inflammatory changes are noted in the medial left buttock in the perianal area of clinical concern. Mild subcutaneous edema is noted. A few small foci of air are noted consistent with the known history of ulceration. No focal abscess is identified. Musculoskeletal: Postsurgical and degenerative changes of lumbar spine are noted. IMPRESSION: Inflammatory changes in the medial left buttock with small foci of air consistent with the known history of ulceration. No subcutaneous abscess is identified. Tiny nonobstructing left renal stone Chronic changes as described above. Electronically Signed   By: Inez Catalina M.D.   On: 12/10/2018 21:37    Procedures .Marland KitchenIncision and  Drainage  Date/Time: 12/10/2018 11:10 PM Performed by: Kathrynn Humble,  Jaloni Sorber, MD Authorized by: Varney Biles, MD   Consent:    Consent obtained:  Verbal   Consent given by:  Patient   Risks discussed:  Bleeding, pain and infection   Alternatives discussed:  No treatment Location:    Location:  Lower extremity   Lower extremity location:  Buttock   Buttock location:  L buttock Pre-procedure details:    Skin preparation:  Betadine Anesthesia (see MAR for exact dosages):    Anesthesia method:  None Procedure type:    Complexity:  Simple Procedure details:    Drainage:  Serosanguinous   Packing materials:  1/4 in gauze   (including critical care time)  Medications Ordered in ED Medications  bupivacaine (MARCAINE) 0.5 % injection 10 mL (has no administration in time range)  lidocaine (XYLOCAINE) 4 % external solution ( Topical Given by Other 12/10/18 2047)  potassium chloride SA (K-DUR) CR tablet 60 mEq (60 mEq Oral Given 12/10/18 2306)     Initial Impression / Assessment and Plan / ED Course  I have reviewed the triage vital signs and the nursing notes.  Pertinent labs & imaging results that were available during my care of the patient were reviewed by me and considered in my medical decision making (see chart for details).        58 year old comes in a chief complaint of rectal pain.  She is actually noted to have left gluteal ulcer with some serosanguineous discharge.  CT scan ordered and does not show any evidence of abscess pocket.  There is cellulitic changes.  There is no evidence of any fistula.  Patient is already on Bactrim.  It seems like the area opened up on its own to serosanguineous discharge.  We will pack the site.  There is some drainage from there and I would like for the drainage to be completed rather than act as a nidus for deep space infection. She has been advised to follow-up with urgent care in 2 to 3 days for recheck.  At that time if there is no signs of  infection and the drainage appears complete then her medical team can focus on topical pain and antiseptic.  Final Clinical Impressions(s) / ED Diagnoses   Final diagnoses:  Skin ulcer of perineum with fat layer exposed ([place])  Cellulitis of buttock    ED Discharge Orders    None       Varney Biles, MD 12/10/18 2313

## 2019-04-17 ENCOUNTER — Encounter (HOSPITAL_COMMUNITY): Payer: Self-pay

## 2019-06-29 NOTE — Progress Notes (Signed)
Patient Care Team: [name], MD as PCP - General (Family Medicine) [name], MD as Consulting Physician (General Surgery) Nicholas Lose, MD as Consulting Physician (Hematology and Oncology) Eppie Gibson, MD as Attending Physician (Radiation Oncology) Gardenia Phlegm, NP as Nurse Practitioner (Hematology and Oncology)  DIAGNOSIS:    ICD-10-CM   1. Malignant neoplasm of upper-outer quadrant of left breast in female, estrogen receptor positive ([place])  C50.412    Z17.0     SUMMARY OF ONCOLOGIC HISTORY: Oncology History  Breast cancer of upper-outer quadrant of left female breast ([place])  12/23/2015 Mammogram   Left breast 1.2 cm spiculated mass 2:00 position, 5 mm intramammary lymph node 2:00 position needs biopsy, 6 mm simple cyst at 1:00 position, T1c Nx stage IA   12/29/2015 Initial Diagnosis   Left breast biopsy UOQ 2:00 and centimeters from nipple: IDC, grade 1, ER 100%, PR 90%, Ki-67 10%, HER-2 negative ratio 1.16   02/04/2016 Surgery   Left lumpectomy: IDC grade 1, 1.1 cm, ADH, ALH,0/3 lymph nodes negative, margins negative,ER 100%, PR 90%, HER-2 negative ratio 1.16, Ki-67 10%, T1 CN 0 stage IA   02/04/2016 Oncotype testing   Oncotype DX score 13, risk of recurrence 9% with tamoxifen alone   05/29/2016 - 07/14/2016 Radiation Therapy   Adjuvant radiation therapy Isidore Moos): 1) Left Breast / 50.4 Gy in 25 fractions.  2) Left Breast Boost / 10 Gy in 5 fractions   09/2016 -  Anti-estrogen oral therapy   Letrozole daily     CHIEF COMPLIANT: Follow-up of left breast cancer on letrozole therapy  INTERVAL HISTORY: [name] is a 59 y.o. with above-mentioned history of left breast cancer who underwent a lumpectomy, radiation, and is currently on letrozole. Mammogram on 07/08/18 showed no evidence of malignancy bilaterally. She presents to the clinic today for annual follow-up.   ALLERGIES:  is allergic to nsaids; soap; augmentin [amoxicillin-pot clavulanate]; latex;  tape; bupropion; duloxetine; phenergan [promethazine hcl]; chlorhexidine gluconate; cymbalta [duloxetine hcl]; mupirocin; and oxycodone.  MEDICATIONS:  Current Outpatient Medications  Medication Sig Dispense Refill  . amLODipine (NORVASC) 5 MG tablet Take 5 mg by mouth daily.    Marland Kitchen atorvastatin (LIPITOR) 20 MG tablet Take 20 mg by mouth at bedtime.     . budesonide (ENTOCORT EC) 3 MG 24 hr capsule Take 3 capsules (9 mg total) by mouth daily. 90 capsule 11  . gabapentin (NEURONTIN) 300 MG capsule Take 300 mg by mouth 2 (two) times daily at 10 AM and 5 PM.    . letrozole (FEMARA) 2.5 MG tablet Take 1 tablet (2.5 mg total) by mouth daily. 90 tablet 3  . losartan-hydrochlorothiazide (HYZAAR) 100-12.5 MG tablet Take 1 tablet by mouth at bedtime.     . ondansetron (ZOFRAN) 4 MG tablet Take 4 mg by mouth 3 (three) times daily as needed for nausea.     . sertraline (ZOLOFT) 100 MG tablet Take 100 mg by mouth at bedtime.     . sulfamethoxazole-trimethoprim (BACTRIM) 400-80 MG tablet Take 1 tablet by mouth 2 (two) times daily.     Current Facility-Administered Medications  Medication Dose Route Frequency Provider Last Rate Last Admin  . 0.9 %  sodium chloride infusion  500 mL Intravenous Once Ladene Artist, MD        PHYSICAL EXAMINATION: ECOG PERFORMANCE STATUS: 1 - Symptomatic but completely ambulatory  Vitals:   06/30/19 1539  BP: 127/84  Pulse: (!) 102  Resp: 19  Temp: 98.5 F (36.9 C)  SpO2: 100%   There were no vitals filed for this visit.  BREAST: No palpable masses or nodules in either right or left breasts. No palpable axillary supraclavicular or infraclavicular adenopathy no breast tenderness or nipple discharge. (exam performed in the presence of a chaperone)  LABORATORY DATA:  I have reviewed the data as listed CMP Latest Ref Rng & Units 12/10/2018 07/12/2017 07/10/2017  Glucose 70 - 99 mg/dL 343(H) 132(H) 130(H)  BUN 6 - 20 mg/dL 13 8 10   Creatinine 0.44 - 1.00 mg/dL 0.85 0.97  1.08(H)  Sodium 135 - 145 mmol/L 132(L) 137 132(L)  Potassium 3.5 - 5.1 mmol/L 2.5(LL) 4.5 4.0  Chloride 98 - 111 mmol/L 95(L) 103 96(L)  CO2 22 - 32 mmol/L 25 26 25   Calcium 8.9 - 10.3 mg/dL 8.7(L) 8.3(L) 9.1  Total Protein 6.5 - 8.1 g/dL 7.0 - 6.0(L)  Total Bilirubin 0.3 - 1.2 mg/dL 0.4 - 0.3  Alkaline Phos 38 - 126 U/L 136(H) - 105  AST 15 - 41 U/L 27 - 31  ALT 0 - 44 U/L 31 - 43    Lab Results  Component Value Date   WBC 13.5 (H) 12/10/2018   HGB 14.1 12/10/2018   HCT 41.8 12/10/2018   MCV 89.7 12/10/2018   PLT 290 12/10/2018   NEUTROABS 8.6 (H) 12/10/2018    ASSESSMENT & PLAN:  Breast cancer of upper-outer quadrant of left female breast ([place]) 12/23/2015: Left breast 1.2 cm spiculated mass 2:00 position, 5 mm intramammary lymph node 2:00 position needs biopsy, 6 mm simple cyst at 1:00 position, T1c Nx stage IA 12/29/2015: Left breast biopsy UOQ 2:00 and centimeters from nipple: IDC, grade 1, ER 100%, PR 90%, Ki-67 10%, HER-2 negative ratio 1.16 02/04/2016: Left lumpectomy: IDC grade 1, 1.1 cm, ADH, ALH,0/3 lymph nodes negative, margins negative,ER 100%, PR 90%, HER-2 negative ratio 1.16, Ki-67 10%, T1 CN 0 stage IA Oncotype DX score 13, risk of recurrence 9% with tamoxifen alone Adjuvant radiation therapy: 05/26/2016- 07/06/2016 ----------------------------------------------------------------------------------------------------------------------------------------------------- Treatment plan: Adjuvant antiestrogen therapy with letrozole 2.5 mg dailystarted May 2018 Letrozoletoxicities: 1.Hot flashes: Continues to be moderate to severe.  Multiple back issues: MRI spine 10/17/2017: L2-L3 stenosis  Severe low back pain related to herniated disc follows with orthopedic surgery. Right leg pain Patient has chronic colitis She lost 35 pounds by watching what she eats.  She plans to lose another 50 more pounds.  Breast cancer surveillance: 1.Mammograms 07/08/2018: Benign,  breast density category B 2.breast exam  06/30/2019: Benign  Return to clinic in1 year for follow-up    No orders of the defined types were placed in this encounter.  The patient has a good understanding of the overall plan. she agrees with it. she will call with any problems that may develop before the next visit here.  Total time spent: 20 mins including face to face time and time spent for planning, charting and coordination of care  Nicholas Lose, MD 06/30/2019  I, Cloyde Reams Dorshimer, am acting as scribe for Dr. Nicholas Lose.  I have reviewed the above documentation for accuracy and completeness, and I agree with the above.

## 2019-06-30 ENCOUNTER — Other Ambulatory Visit: Payer: Self-pay

## 2019-06-30 ENCOUNTER — Inpatient Hospital Stay: Payer: Medicaid Other | Attending: Hematology and Oncology | Admitting: Hematology and Oncology

## 2019-06-30 DIAGNOSIS — C50412 Malignant neoplasm of upper-outer quadrant of left female breast: Secondary | ICD-10-CM | POA: Diagnosis present

## 2019-06-30 DIAGNOSIS — Z923 Personal history of irradiation: Secondary | ICD-10-CM | POA: Diagnosis not present

## 2019-06-30 DIAGNOSIS — N951 Menopausal and female climacteric states: Secondary | ICD-10-CM | POA: Diagnosis not present

## 2019-06-30 DIAGNOSIS — Z170 Estrogen receptor positive status [ER+]: Secondary | ICD-10-CM | POA: Insufficient documentation

## 2019-06-30 DIAGNOSIS — M545 Low back pain: Secondary | ICD-10-CM | POA: Diagnosis not present

## 2019-06-30 DIAGNOSIS — Z79811 Long term (current) use of aromatase inhibitors: Secondary | ICD-10-CM | POA: Insufficient documentation

## 2019-06-30 DIAGNOSIS — M79604 Pain in right leg: Secondary | ICD-10-CM | POA: Diagnosis not present

## 2019-06-30 DIAGNOSIS — K529 Noninfective gastroenteritis and colitis, unspecified: Secondary | ICD-10-CM | POA: Diagnosis not present

## 2019-06-30 MED ORDER — LETROZOLE 2.5 MG PO TABS: 2.5000 mg | ORAL_TABLET | Freq: Every day | ORAL | 3 refills | Status: DC

## 2019-06-30 NOTE — Assessment & Plan Note (Signed)
12/23/2015: Left breast 1.2 cm spiculated mass 2:00 position, 5 mm intramammary lymph node 2:00 position needs biopsy, 6 mm simple cyst at 1:00 position, T1c Nx stage IA 12/29/2015: Left breast biopsy UOQ 2:00 and centimeters from nipple: IDC, grade 1, ER 100%, PR 90%, Ki-67 10%, HER-2 negative ratio 1.16 02/04/2016: Left lumpectomy: IDC grade 1, 1.1 cm, ADH, ALH,0/3 lymph nodes negative, margins negative,ER 100%, PR 90%, HER-2 negative ratio 1.16, Ki-67 10%, T1 CN 0 stage IA Oncotype DX score 13, risk of recurrence 9% with tamoxifen alone Adjuvant radiation therapy: 05/26/2016- 07/06/2016 ----------------------------------------------------------------------------------------------------------------------------------------------------- Treatment plan: Adjuvant antiestrogen therapy with letrozole 2.5 mg dailystarted May 2018 Letrozoletoxicities: 1.Hot flashes: Stable  Multiple back issues: MRI spine 10/17/2017: L2-L3 stenosis  Severe low back pain related to herniated disc follows with orthopedic surgery. Right leg pain Patient has chronic colitis  Breast cancer surveillance: 1.Mammograms 07/08/2018: Benign, breast density category B 2.breast exam  06/30/2019: Benign  Return to clinic in1 year for follow-up

## 2019-07-01 ENCOUNTER — Telehealth: Payer: Self-pay | Admitting: Hematology and Oncology

## 2019-07-01 NOTE — Telephone Encounter (Signed)
I left a message regarding schedule  

## 2019-10-13 IMAGING — CT CT ABDOMEN AND PELVIS WITHOUT CONTRAST
2 of 4 series · 16 of 46 positions shown, 18 images · non-contrast
Comparison: 07/10/2017

CLINICAL DATA: Perianal skin ulceration

EXAM:
CT ABDOMEN AND PELVIS WITHOUT CONTRAST
TECHNIQUE: Multidetector CT imaging of the abdomen and pelvis was performed
following the standard protocol without IV contrast.

[Series 2: axial st · axial · 0.96mm/px · z∈[-529,-59]mm · 13 of 106 slices shown, 15 images]
[im 6/106  soft-tissue]
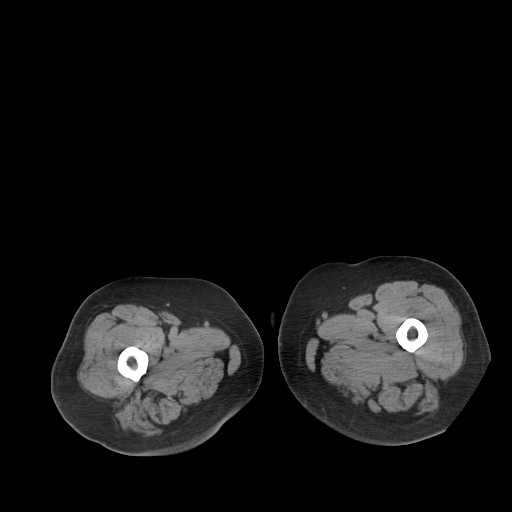
[im 6/106  bone]
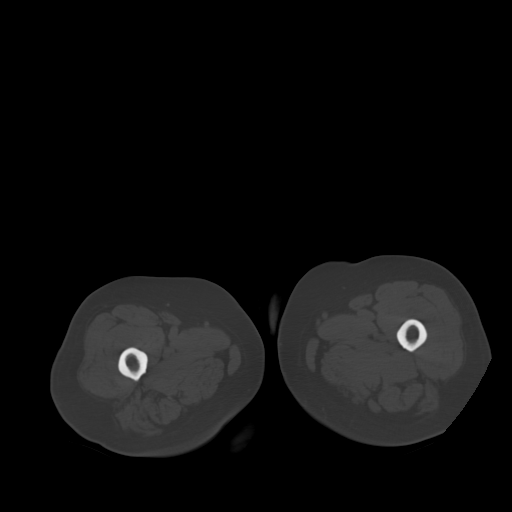
[im 17/106  soft-tissue]
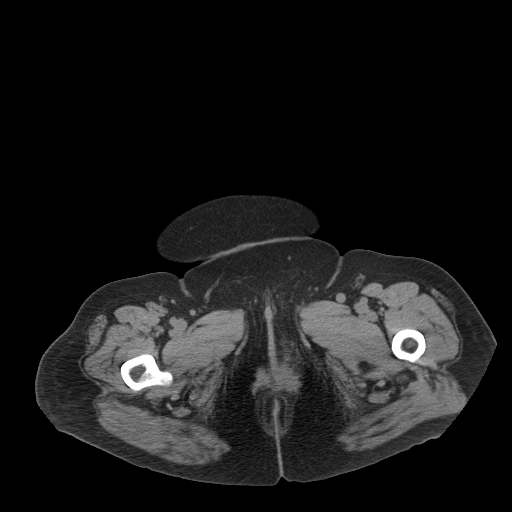
[im 23/106  soft-tissue]
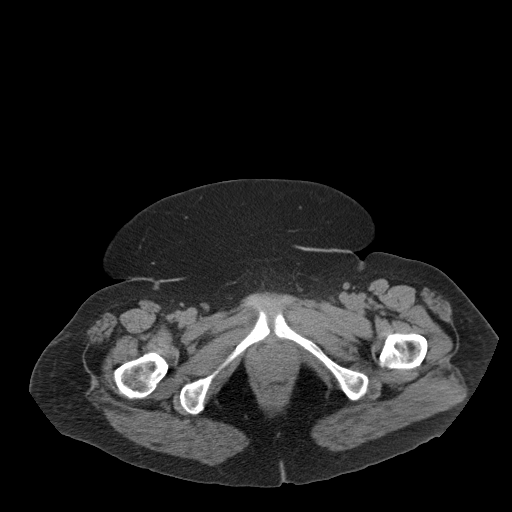
[im 28/106  soft-tissue]
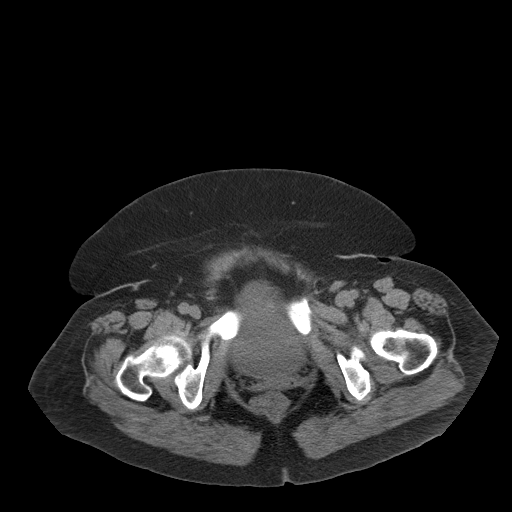
[im 39/106  soft-tissue]
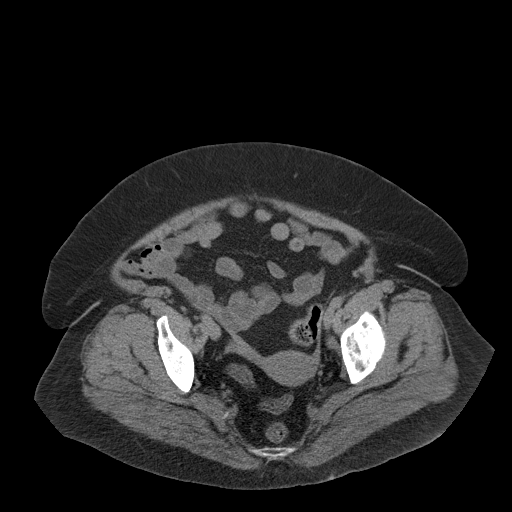
[im 45/106  soft-tissue]
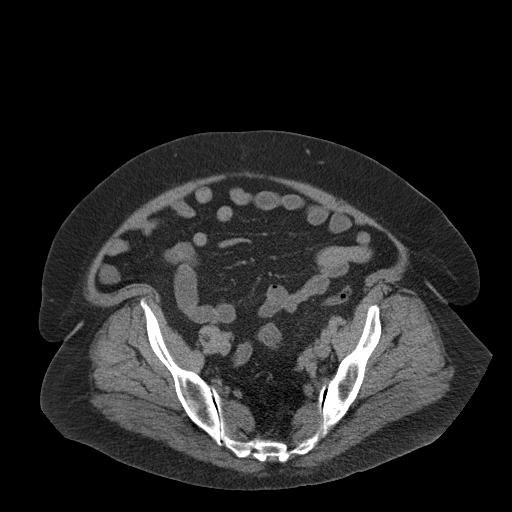
[im 56/106  soft-tissue]
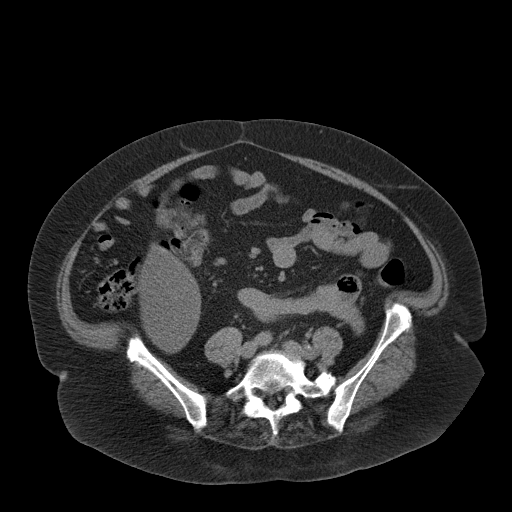
[im 61/106  soft-tissue]
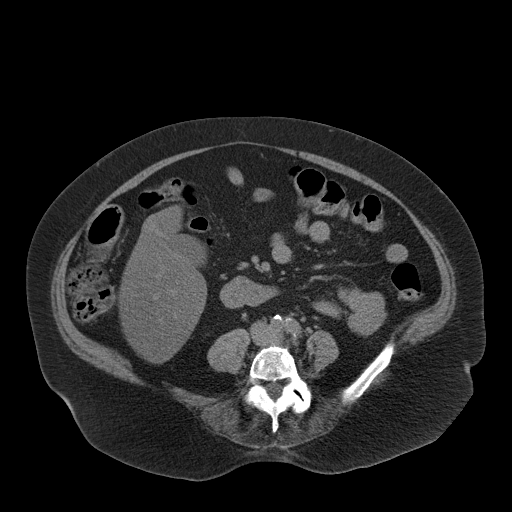
[im 67/106  soft-tissue]
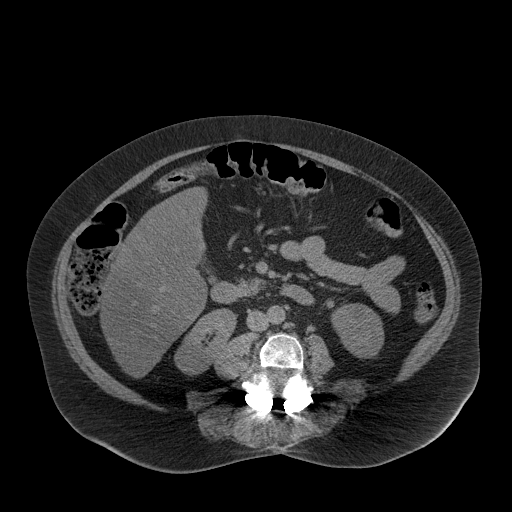
[im 67/106  bone]
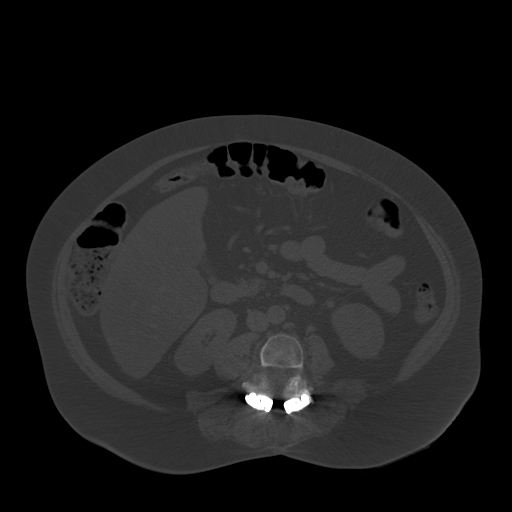
[im 78/106  soft-tissue]
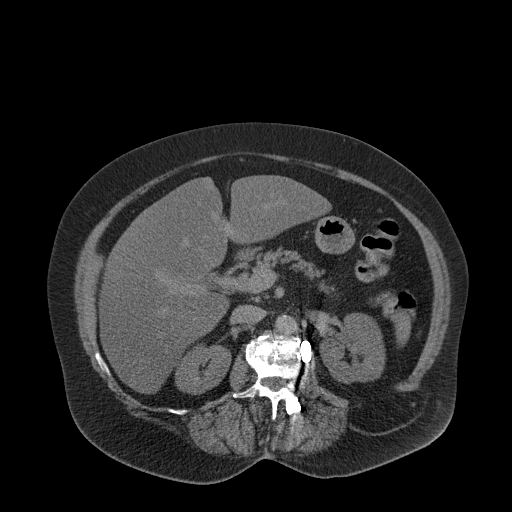
[im 83/106  soft-tissue]
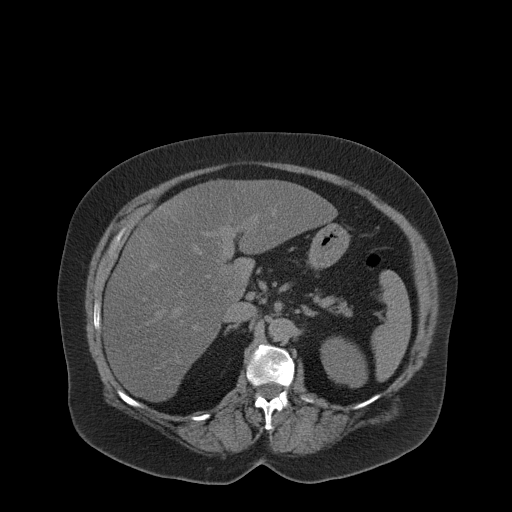
[im 89/106  soft-tissue]
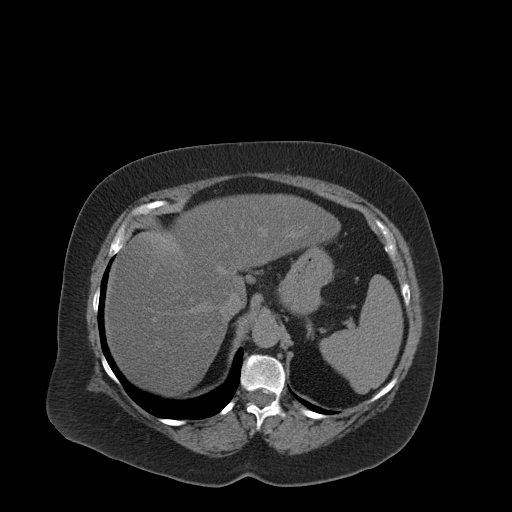
[im 100/106  soft-tissue]
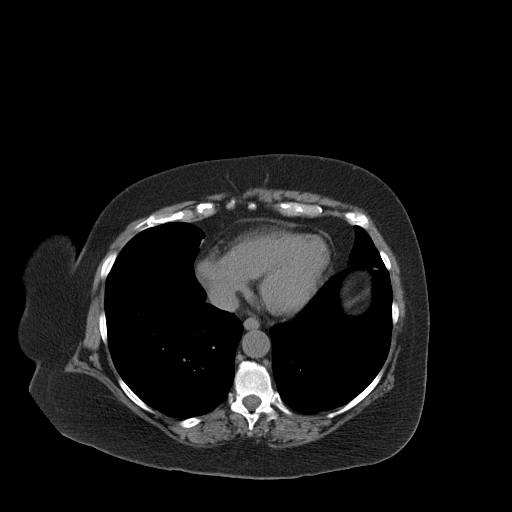

[Series 4: coronal st · coronal · 1.08mm/px · 3 of 172 slices shown]
[im 58/172  soft-tissue]
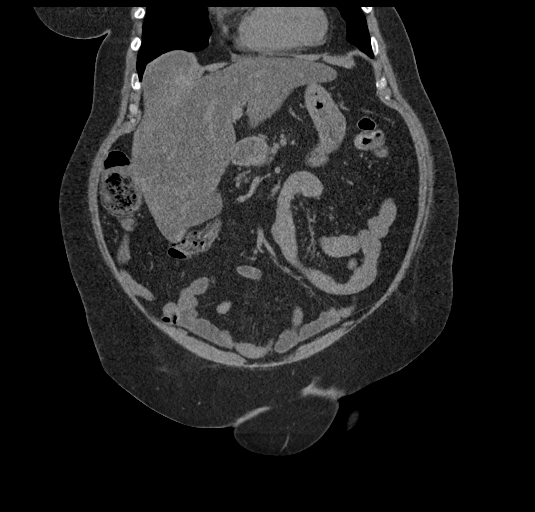
[im 77/172  soft-tissue]
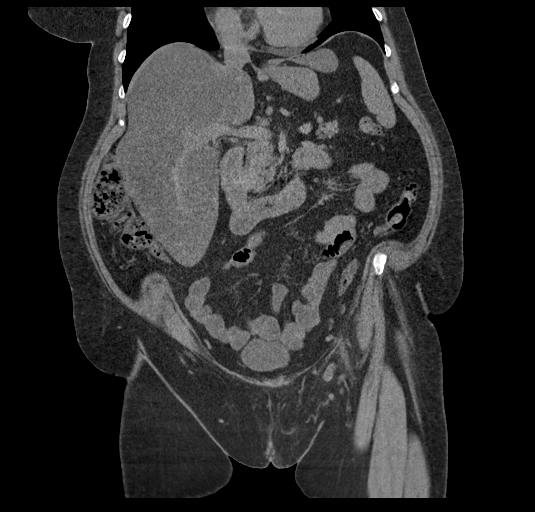
[im 96/172  soft-tissue]
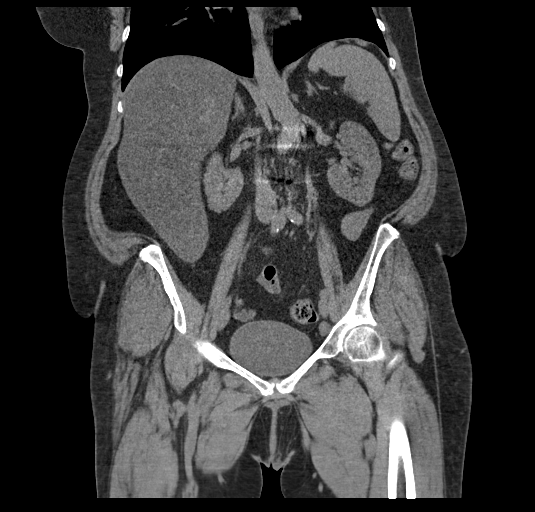

[16 of 46 positions shown; findings below may reference images not displayed]

FINDINGS: Lower chest: No acute abnormality.

Hepatobiliary: Diffuse fatty infiltration of the liver is noted. The
gallbladder is within normal limits.

Pancreas: Unremarkable. No pancreatic ductal dilatation or
surrounding inflammatory changes.

Spleen: Normal in size without focal abnormality.

Adrenals/Urinary Tract: Adrenal glands are within normal limits
bilaterally. Tiny nonobstructing left renal stone is noted in the
lower pole. No obstructive changes are noted bilaterally. The
bladder is partially distended.

Stomach/Bowel: Colon is predominately decompressed. No inflammatory
or obstructive changes are seen. The appendix is not well
visualized. No inflammatory changes to suggest appendicitis are
noted. Small bowel is within normal limits. Stomach is decompressed.

Vascular/Lymphatic: Aortic atherosclerosis. No enlarged abdominal or
pelvic lymph nodes.

Reproductive: Uterus and bilateral adnexa are unremarkable.

Other: Inflammatory changes are noted in the medial left buttock in
the perianal area of clinical concern. Mild subcutaneous edema is
noted. A few small foci of air are noted consistent with the known
history of ulceration. No focal abscess is identified.

Musculoskeletal: Postsurgical and degenerative changes of lumbar
spine are noted.
IMPRESSION: Inflammatory changes in the medial left buttock with small foci of
air consistent with the known history of ulceration. No subcutaneous
abscess is identified.

Tiny nonobstructing left renal stone

Chronic changes as described above.

## 2019-11-05 ENCOUNTER — Encounter (HOSPITAL_COMMUNITY): Payer: Self-pay

## 2019-11-05 ENCOUNTER — Other Ambulatory Visit: Payer: Self-pay

## 2019-11-05 ENCOUNTER — Inpatient Hospital Stay (HOSPITAL_COMMUNITY)
Admission: EM | Admit: 2019-11-05 | Discharge: 2019-11-07 | DRG: 602 | Disposition: A | Discharge status: Home / Self Care | Payer: BC Managed Care – PPO | Attending: Internal Medicine | Admitting: Internal Medicine

## 2019-11-05 DIAGNOSIS — Z800 Family history of malignant neoplasm of digestive organs: Secondary | ICD-10-CM

## 2019-11-05 DIAGNOSIS — Z981 Arthrodesis status: Secondary | ICD-10-CM

## 2019-11-05 DIAGNOSIS — Z8673 Personal history of transient ischemic attack (TIA), and cerebral infarction without residual deficits: Secondary | ICD-10-CM

## 2019-11-05 DIAGNOSIS — Z91040 Latex allergy status: Secondary | ICD-10-CM

## 2019-11-05 DIAGNOSIS — G43909 Migraine, unspecified, not intractable, without status migrainosus: Secondary | ICD-10-CM | POA: Diagnosis present

## 2019-11-05 DIAGNOSIS — I10 Essential (primary) hypertension: Secondary | ICD-10-CM | POA: Diagnosis present

## 2019-11-05 DIAGNOSIS — M5430 Sciatica, unspecified side: Secondary | ICD-10-CM | POA: Diagnosis present

## 2019-11-05 DIAGNOSIS — Z8371 Family history of colonic polyps: Secondary | ICD-10-CM

## 2019-11-05 DIAGNOSIS — E785 Hyperlipidemia, unspecified: Secondary | ICD-10-CM | POA: Diagnosis present

## 2019-11-05 DIAGNOSIS — L03116 Cellulitis of left lower limb: Principal | ICD-10-CM | POA: Diagnosis present

## 2019-11-05 DIAGNOSIS — R2242 Localized swelling, mass and lump, left lower limb: Secondary | ICD-10-CM

## 2019-11-05 DIAGNOSIS — F17210 Nicotine dependence, cigarettes, uncomplicated: Secondary | ICD-10-CM | POA: Diagnosis present

## 2019-11-05 DIAGNOSIS — E7800 Pure hypercholesterolemia, unspecified: Secondary | ICD-10-CM | POA: Diagnosis present

## 2019-11-05 DIAGNOSIS — Z923 Personal history of irradiation: Secondary | ICD-10-CM

## 2019-11-05 DIAGNOSIS — K529 Noninfective gastroenteritis and colitis, unspecified: Secondary | ICD-10-CM | POA: Diagnosis present

## 2019-11-05 DIAGNOSIS — Z8249 Family history of ischemic heart disease and other diseases of the circulatory system: Secondary | ICD-10-CM | POA: Diagnosis not present

## 2019-11-05 DIAGNOSIS — C50412 Malignant neoplasm of upper-outer quadrant of left female breast: Secondary | ICD-10-CM | POA: Diagnosis present

## 2019-11-05 DIAGNOSIS — K52832 Lymphocytic colitis: Secondary | ICD-10-CM | POA: Diagnosis present

## 2019-11-05 DIAGNOSIS — Z888 Allergy status to other drugs, medicaments and biological substances status: Secondary | ICD-10-CM | POA: Diagnosis not present

## 2019-11-05 DIAGNOSIS — F419 Anxiety disorder, unspecified: Secondary | ICD-10-CM | POA: Diagnosis present

## 2019-11-05 DIAGNOSIS — A419 Sepsis, unspecified organism: Secondary | ICD-10-CM

## 2019-11-05 DIAGNOSIS — Z833 Family history of diabetes mellitus: Secondary | ICD-10-CM

## 2019-11-05 DIAGNOSIS — W57XXXA Bitten or stung by nonvenomous insect and other nonvenomous arthropods, initial encounter: Secondary | ICD-10-CM | POA: Diagnosis present

## 2019-11-05 DIAGNOSIS — I959 Hypotension, unspecified: Secondary | ICD-10-CM | POA: Diagnosis present

## 2019-11-05 DIAGNOSIS — Z803 Family history of malignant neoplasm of breast: Secondary | ICD-10-CM

## 2019-11-05 DIAGNOSIS — E876 Hypokalemia: Secondary | ICD-10-CM | POA: Diagnosis present

## 2019-11-05 DIAGNOSIS — Z20822 Contact with and (suspected) exposure to covid-19: Secondary | ICD-10-CM | POA: Diagnosis present

## 2019-11-05 DIAGNOSIS — Z91048 Other nonmedicinal substance allergy status: Secondary | ICD-10-CM

## 2019-11-05 DIAGNOSIS — Z720 Tobacco use: Secondary | ICD-10-CM

## 2019-11-05 DIAGNOSIS — F329 Major depressive disorder, single episode, unspecified: Secondary | ICD-10-CM | POA: Diagnosis present

## 2019-11-05 DIAGNOSIS — Z886 Allergy status to analgesic agent status: Secondary | ICD-10-CM

## 2019-11-05 DIAGNOSIS — Z8614 Personal history of Methicillin resistant Staphylococcus aureus infection: Secondary | ICD-10-CM

## 2019-11-05 DIAGNOSIS — Z79899 Other long term (current) drug therapy: Secondary | ICD-10-CM

## 2019-11-05 DIAGNOSIS — Z853 Personal history of malignant neoplasm of breast: Secondary | ICD-10-CM

## 2019-11-05 DIAGNOSIS — Z79811 Long term (current) use of aromatase inhibitors: Secondary | ICD-10-CM

## 2019-11-05 DIAGNOSIS — E119 Type 2 diabetes mellitus without complications: Secondary | ICD-10-CM | POA: Diagnosis present

## 2019-11-05 LAB — CBC WITH DIFFERENTIAL/PLATELET
Abs Immature Granulocytes: 0.15 10*3/uL — ABNORMAL HIGH (ref 0.00–0.07)
Basophils Absolute: 0.1 10*3/uL (ref 0.0–0.1)
Basophils Relative: 1 %
Eosinophils Absolute: 0.8 10*3/uL — ABNORMAL HIGH (ref 0.0–0.5)
Eosinophils Relative: 6 %
HCT: 43.2 % (ref 36.0–46.0)
Hemoglobin: 14.5 g/dL (ref 12.0–15.0)
Immature Granulocytes: 1 %
Lymphocytes Relative: 17 %
Lymphs Abs: 2.3 10*3/uL (ref 0.7–4.0)
MCH: 30.5 pg (ref 26.0–34.0)
MCHC: 33.6 g/dL (ref 30.0–36.0)
MCV: 90.9 fL (ref 80.0–100.0)
Monocytes Absolute: 0.9 10*3/uL (ref 0.1–1.0)
Monocytes Relative: 7 %
Neutro Abs: 9.8 10*3/uL — ABNORMAL HIGH (ref 1.7–7.7)
Neutrophils Relative %: 68 %
Platelets: 310 10*3/uL (ref 150–400)
RBC: 4.75 MIL/uL (ref 3.87–5.11)
RDW: 13.4 % (ref 11.5–15.5)
WBC: 14.1 10*3/uL — ABNORMAL HIGH (ref 4.0–10.5)
nRBC: 0 % (ref 0.0–0.2)

## 2019-11-05 LAB — BASIC METABOLIC PANEL
Anion gap: 13 (ref 5–15)
BUN: 19 mg/dL (ref 6–20)
CO2: 24 mmol/L (ref 22–32)
Calcium: 9.1 mg/dL (ref 8.9–10.3)
Chloride: 98 mmol/L (ref 98–111)
Creatinine, Ser: 1.11 mg/dL — ABNORMAL HIGH (ref 0.44–1.00)
GFR calc Af Amer: >60 mL/min (ref >60)
GFR calc non Af Amer: 55 mL/min — ABNORMAL LOW (ref >60)
Glucose, Bld: 159 mg/dL — ABNORMAL HIGH (ref 70–99)
Potassium: 2.9 mmol/L — ABNORMAL LOW (ref 3.5–5.1)
Sodium: 135 mmol/L (ref 135–145)

## 2019-11-05 LAB — SARS CORONAVIRUS 2 BY RT PCR (HOSPITAL ORDER, PERFORMED IN ~~LOC~~ HOSPITAL LAB): SARS Coronavirus 2: NEGATIVE

## 2019-11-05 LAB — MAGNESIUM: Magnesium: 2.2 mg/dL (ref 1.7–2.4)

## 2019-11-05 LAB — LACTIC ACID, PLASMA: Lactic Acid, Venous: 1.2 mmol/L (ref 0.5–1.9)

## 2019-11-05 MED ORDER — AMLODIPINE BESYLATE 5 MG PO TABS
5.0000 mg | ORAL_TABLET | Freq: Every day | ORAL | Status: DC
  Filled 2019-11-05: qty 1

## 2019-11-05 MED ORDER — ONDANSETRON HCL 4 MG/2ML IJ SOLN: 4.0000 mg | Freq: Four times a day (QID) | INTRAMUSCULAR | Status: DC | PRN

## 2019-11-05 MED ORDER — VANCOMYCIN HCL IN DEXTROSE 1-5 GM/200ML-% IV SOLN
1000.0000 mg | Freq: Two times a day (BID) | INTRAVENOUS | Status: DC
  Administered 2019-11-06 – 2019-11-07 (×3): 1000 mg via INTRAVENOUS
  Filled 2019-11-05 (×3): qty 200

## 2019-11-05 MED ORDER — HYDROCHLOROTHIAZIDE 12.5 MG PO CAPS
12.5000 mg | ORAL_CAPSULE | Freq: Every day | ORAL | Status: DC
  Administered 2019-11-05: 12.5 mg via ORAL
  Filled 2019-11-05: qty 1

## 2019-11-05 MED ORDER — POTASSIUM CHLORIDE CRYS ER 20 MEQ PO TBCR
40.0000 meq | EXTENDED_RELEASE_TABLET | ORAL | Status: AC
  Administered 2019-11-05 – 2019-11-06 (×2): 40 meq via ORAL
  Filled 2019-11-05 (×3): qty 2

## 2019-11-05 MED ORDER — SODIUM CHLORIDE 0.9 % IV SOLN
2.0000 g | Freq: Three times a day (TID) | INTRAVENOUS | Status: DC
  Administered 2019-11-05 – 2019-11-06 (×2): 2 g via INTRAVENOUS
  Filled 2019-11-05 (×2): qty 2

## 2019-11-05 MED ORDER — SODIUM CHLORIDE 0.9 % IV BOLUS
500.0000 mL | Freq: Once | INTRAVENOUS | Status: AC
  Administered 2019-11-05: 500 mL via INTRAVENOUS

## 2019-11-05 MED ORDER — BUDESONIDE 3 MG PO CPEP
9.0000 mg | ORAL_CAPSULE | Freq: Every day | ORAL | Status: DC
  Administered 2019-11-06 – 2019-11-07 (×2): 9 mg via ORAL
  Filled 2019-11-05 (×2): qty 3

## 2019-11-05 MED ORDER — VANCOMYCIN HCL 2000 MG/400ML IV SOLN
2000.0000 mg | Freq: Once | INTRAVENOUS | Status: AC
  Administered 2019-11-05: 2000 mg via INTRAVENOUS
  Filled 2019-11-05: qty 400

## 2019-11-05 MED ORDER — ACETAMINOPHEN 650 MG RE SUPP: 650.0000 mg | Freq: Four times a day (QID) | RECTAL | Status: DC | PRN

## 2019-11-05 MED ORDER — LETROZOLE 2.5 MG PO TABS
2.5000 mg | ORAL_TABLET | Freq: Every day | ORAL | Status: DC
  Administered 2019-11-06 – 2019-11-07 (×2): 2.5 mg via ORAL
  Filled 2019-11-05 (×5): qty 1

## 2019-11-05 MED ORDER — POTASSIUM CHLORIDE 10 MEQ/100ML IV SOLN
10.0000 meq | Freq: Once | INTRAVENOUS | Status: AC
  Administered 2019-11-05: 10 meq via INTRAVENOUS
  Filled 2019-11-05: qty 100

## 2019-11-05 MED ORDER — ONDANSETRON HCL 4 MG PO TABS: 4.0000 mg | ORAL_TABLET | Freq: Four times a day (QID) | ORAL | Status: DC | PRN

## 2019-11-05 MED ORDER — ACETAMINOPHEN 325 MG PO TABS
650.0000 mg | ORAL_TABLET | Freq: Four times a day (QID) | ORAL | Status: DC | PRN
  Administered 2019-11-06: 650 mg via ORAL
  Filled 2019-11-05: qty 2

## 2019-11-05 MED ORDER — LOSARTAN POTASSIUM 50 MG PO TABS
100.0000 mg | ORAL_TABLET | Freq: Every day | ORAL | Status: DC
  Administered 2019-11-05: 100 mg via ORAL
  Filled 2019-11-05: qty 2

## 2019-11-05 MED ORDER — ENOXAPARIN SODIUM 40 MG/0.4ML ~~LOC~~ SOLN: 40.0000 mg | Freq: Every day | SUBCUTANEOUS | Status: DC

## 2019-11-05 MED ORDER — LOSARTAN POTASSIUM-HCTZ 100-12.5 MG PO TABS: 1.0000 | ORAL_TABLET | Freq: Every day | ORAL | Status: DC

## 2019-11-05 NOTE — ED Provider Notes (Signed)
[facility] Provider Note   CSN: [id number] Arrival date & time: 11/05/19  1728     History Chief Complaint  Patient presents with  . Marland KitchenTick Bite    [name] is a 59 y.o. female history includes arthritis, breast cancer, high cholesterol, hypertension, migraines, sciatica, hyperlipidemia, TIA, anxiety.  Patient presents today for concern of leg infection.  She reports she was bit by a tick around 3 weeks ago.  She developed redness pain and swelling to the area over the past 5-6 days.  Describes pain as constant moderate-severe in intensity throbbing ache nonradiating worsened with palpation no alleviating factors.  She reports she was seen in urgent care 3 days ago for the same problem, was discharged with amoxicillin and Bactrim which she has been taking without improvement of her symptoms.  She believes the area of erythema has continued to spread and she is now having drainage from her leg.  Reports tactile fever and chills at home but does not have a thermometer to measure her temperature.  She has been using Tylenol without relief of symptoms.  She denies headache, vision changes, chest pain/shortness of breath, cough, abdominal pain, nausea/vomiting, diarrhea, numbness/weakness, tingling or any additional concerns. HPI     Past Medical History:  Diagnosis Date  . Anxiety   . Arthritis    lower back  . Breast cancer ([place])   . Breast cancer of upper-outer quadrant of left female breast ([place]) 01/03/2016  . Complication of anesthesia    hx. of combativeness as child; had severe migraine headache day after back surgery  . Cough due to ACE inhibitor 01/28/2016  . Depression   . Eczema    right hip  . High cholesterol   . History of radiation therapy 05/29/16- 07/14/16   Left Breast/ 50.4 Gy n 25 fractions, Left Breast Boost/ 10 Gy in 5 fractions.   . Hypertension    states under control with meds., has been on med. since age 22  . Lymphocytic colitis    . Migraines   . Personal history of radiation therapy   . PONV (postoperative nausea and vomiting)   . Sciatica   . Sinus drainage 01/28/2016  . Urinary urgency     Patient Active Problem List   Diagnosis Date Noted  . Neck abscess 07/10/2017  . Hypotension 07/10/2017  . Hyponatremia 07/10/2017  . FH: colon cancer 04/04/2017  . Cellulitis of breast   . Septic shock ([place])   . Breast cancer of upper-outer quadrant of left female breast ([place]) 01/03/2016  . 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 Spondylolisthesis of lumbar region 04/28/2013    Class: Chronic  . Dehydration 08/09/2011  . Hypertension 08/09/2011  . Hyperlipidemia LDL goal < 100 08/09/2011  . Lymphocytic colitis 08/09/2011  . TIA (transient ischemic attack) 08/09/2011  . ANXIETY 02/24/2010  . DEPRESSION 02/24/2010  . FLATULENCE-GAS-BLOATING 02/07/2008  . OTH&UNSPEC NONINFECTIOUS GASTROENTERITIS&COLITIS 12/12/2007  . DIARRHEA 10/10/2007  . ABDOMINAL PAIN, GENERALIZED 10/10/2007    Past Surgical History:  Procedure Laterality Date  . ANTERIOR LAT LUMBAR FUSION Left 07/11/2017   Procedure: LEFT EXTREME LATERAL INTERBODY FUSION AND INSTRUMENTATION AND ALLOGRAFT LUMBAR 1-2/2-3;  Surgeon: Phylliss Bob, MD;  Location: [facility];  Service: Orthopedics;  Laterality: Left;  . BREAST BIOPSY    . BREAST LUMPECTOMY     left 2017  . BREAST LUMPECTOMY WITH NEEDLE LOCALIZATION AND AXILLARY SENTINEL LYMPH NODE BX Left 02/04/2016  Procedure: LEFT BREAST LUMPECTOMY WITH 2 NEEDLE LOCALIZATIONS AND LEFT AXILLARY SENTINEL LYMPH NODE BIOPSY;  Surgeon: [name], MD;  Location: [facility];  Service: General;  Laterality: Left;  LEFT BREAST LUMPECTOMY WITH 2 NEEDLE LOCALIZATIONS AND LEFT AXILLARY SENTINEL LYMPH NODE BIOPSY  . [place]  . COLONOSCOPY     Hx: of  . CYSTO  09/29/2005   with anterior repair  . ENDOMETRIAL ABLATION  09/29/2005  .  INCISION AND DRAINAGE ABSCESS Left 02/21/2016   Procedure: INCISION AND DRAINAGE ABSCESS;  Surgeon: Excell Seltzer, MD;  Location: [facility];  Service: General;  Laterality: Left;  . INCONTINENCE SURGERY  09/29/2005  . KNEE ARTHROSCOPY W/ DEBRIDEMENT    . LUMBAR FUSION  04/28/2013   L3-4  . TONSILLECTOMY     age 77  . TUBAL LIGATION    . WISDOM TOOTH EXTRACTION     as a teenager     OB History    Gravida  3   Para  2   Term  2   Preterm      AB  1   Living  2     SAB  1   TAB      Ectopic      Multiple      Live Births              Family History  Problem Relation Age of [place]  . Colon cancer Father        53-65  . Hypertension Father   . Colon polyps Other   . Diabetes Other   . Breast cancer Paternal Aunt     Social History   Tobacco Use  . Smoking status: Current Every Day Smoker    Packs/day: 1.00    Years: 12.00    Pack years: 12.00    Types: Cigarettes  . Smokeless tobacco: Never Used  . Tobacco comment: Tobacco info given 04/04/17  Vaping Use  . Vaping Use: Never used  Substance Use Topics  . Alcohol use: No  . Drug use: No    Home Medications Prior to Admission medications   Medication Sig Start Date End Date Taking? Authorizing Provider  amLODipine (NORVASC) 5 MG tablet Take 5 mg by mouth daily.    [provider]  atorvastatin (LIPITOR) 20 MG tablet Take 20 mg by mouth at bedtime.     [provider]  budesonide (ENTOCORT EC) 3 MG 24 hr capsule Take 3 capsules (9 mg total) by mouth daily. 11/27/18   Ladene Artist, MD  gabapentin (NEURONTIN) 300 MG capsule Take 300 mg by mouth 2 (two) times daily at 10 AM and 5 PM. 03/18/18   [provider]  letrozole (FEMARA) 2.5 MG tablet Take 1 tablet (2.5 mg total) by mouth daily. 06/30/19   Nicholas Lose, MD  losartan-hydrochlorothiazide (HYZAAR) 100-12.5 MG tablet Take 1 tablet by mouth at bedtime.  10/16/16   [provider]  ondansetron (ZOFRAN) 4 MG  tablet Take 4 mg by mouth 3 (three) times daily as needed for nausea.  03/18/18   [provider]  sertraline (ZOLOFT) 100 MG tablet Take 100 mg by mouth at bedtime.  08/04/18   [provider]  sulfamethoxazole-trimethoprim (BACTRIM) 400-80 MG tablet Take 1 tablet by mouth 2 (two) times daily.    [provider]    Allergies    Nsaids, Soap, Augmentin [amoxicillin-pot clavulanate], Latex, Tape, Bupropion, Duloxetine, Phenergan [promethazine hcl], Chlorhexidine gluconate, Cymbalta [  duloxetine hcl], and Mupirocin  Review of Systems   Review of Systems Ten systems are reviewed and are negative for acute change except as noted in the HPI  Physical Exam Updated Vital Signs BP (!) 148/88   Pulse 89   Temp 99.1 F (37.3 C) (Oral)   Resp 17   Ht 5\' 7"  (1.702 m)   Wt 90.4 kg   SpO2 100%   BMI 31.21 kg/m   Physical Exam Constitutional:      General: She is not in acute distress.    Appearance: Normal appearance. She is well-developed. She is not ill-appearing or diaphoretic.  HENT:     Head: Normocephalic and atraumatic.  Eyes:     General: Vision grossly intact. Gaze aligned appropriately.     Pupils: Pupils are equal, round, and reactive to light.  Neck:     Trachea: Trachea and phonation normal.  Cardiovascular:     Pulses:          Dorsalis pedis pulses are 2+ on the right side and 2+ on the left side.  Pulmonary:     Effort: Pulmonary effort is normal. No respiratory distress.  Abdominal:     General: There is no distension.     Palpations: Abdomen is soft.     Tenderness: There is no abdominal tenderness. There is no guarding or rebound.  Musculoskeletal:        General: Normal range of motion.     Cervical back: Normal range of motion.  Feet:     Right foot:     Protective Sensation: 2 sites tested. 2 sites sensed.     Left foot:     Protective Sensation: 2 sites tested. 2 sites sensed.  Skin:    General: Skin is warm and dry.           Comments: Large area of erythema induration and swelling of the posterior lateral left leg.  There is a pinpoint area of serosanguineous drainage from the superior portion.  Streaking present.  Range of motion at the left knee is intact without increase in pain.  No pain along the joint lines or with palpation of the patella.  Neurological:     Mental Status: She is alert.     GCS: GCS eye subscore is 4. GCS verbal subscore is 5. GCS motor subscore is 6.     Comments: Speech is clear and goal oriented, follows commands Major Cranial nerves without deficit, no facial droop Moves extremities without ataxia, coordination intact  Psychiatric:        Behavior: Behavior normal.     ED Results / Procedures / Treatments   Labs (all labs ordered are listed, but only abnormal results are displayed) Labs Reviewed  CBC WITH DIFFERENTIAL/PLATELET - Abnormal; Notable for the following components:      Result Value   WBC 14.1 (*)    Neutro Abs 9.8 (*)    Eosinophils Absolute 0.8 (*)    Abs Immature Granulocytes 0.15 (*)    All other components within normal limits  BASIC METABOLIC PANEL - Abnormal; Notable for the following components:   Potassium 2.9 (*)    Glucose, Bld 159 (*)    Creatinine, Ser 1.11 (*)    GFR calc non Af Amer 55 (*)    All other components within normal limits  SARS CORONAVIRUS 2 BY RT PCR ([facility])  LACTIC ACID, PLASMA    EKG None  Radiology No  results found.  Procedures Procedures (including critical care time)  Medications Ordered in ED Medications  vancomycin (VANCOREADY) IVPB 2000 mg/400 mL (2,000 mg Intravenous New Bag/Given 11/05/19 2100)  vancomycin (VANCOCIN) IVPB 1000 mg/200 mL premix (has no administration in time range)  potassium chloride 10 mEq in 100 mL IVPB (has no administration in time range)  sodium chloride 0.9 % bolus 500 mL (has no administration in time range)  sodium chloride 0.9 % bolus 500 mL  (0 mLs Intravenous Stopped 11/05/19 2053)    ED Course  I have reviewed the triage vital signs and the nursing notes.  Pertinent labs & imaging results that were available during my care of the patient were reviewed by me and considered in my medical decision making (see chart for details).    MDM Rules/Calculators/A&P                          Additional History Obtained: 1. Nursing notes from this visit. 2. Electronic medical records reviewed, patient seen in urgent care on 11/02/2019.  Diagnosis of cellulitis and abscess of the left leg, tick bite.  They gave patient 1 g injection of Rocephin, discharged her on Bactrim twice daily x5 days and amoxicillin 500 mg 3 times daily x10 days.  Patient had a doxycycline allergy? ---- 59 year old female presents today with worsening cellulitis of the left lower extremity now with serosanguineous drainage present.  Erythema has spread down her leg, significant streaking, tactile fever/chills at home.  She has good range of motion at the left knee, doubt septic arthritis.  Additionally given history and physical examination doubt DVT, compartment syndrome or other emergent pathology requiring imaging today.  Suspect patient with failed outpatient treatment of cellulitis.  I ordered, reviewed and interpreted labs which include: CBC shows leukocytosis of 14.1 with left shift, no anemia.  Suspect leukocytosis secondary to infection. BMP shows hypokalemia at 2.9, will begin repletion here in the ED.  Creatinine of 1.11 slightly elevated from baseline we will give fluid bolus. Lactic 1.2 is reassuring. Covid test pending. - Given worsening spread of erythema, concern for failed outpatient treatment of cellulitis.  Patient was started on IV vancomycin and consult was placed to hospitalist for admission.  Patient was seen and evaluated by Dr. Gilford Raid during this visit who agrees with care plan. - Patient reevaluated she is resting comfortably and in no acute  distress, states understanding of care plan and is agreeable for admission.  I then spoke with Dr. Denton Brick and patient was admitted to hospitalist service.    Note: Portions of this report may have been transcribed using voice recognition software. Every effort was made to ensure accuracy; however, inadvertent computerized transcription errors may still be present. Final Clinical Impression(s) / ED Diagnoses Final diagnoses:  Cellulitis of left lower extremity    Rx / DC Orders ED Discharge Orders    None       Gari Crown 11/05/19 2218    Isla Pence, MD 11/05/19 2352

## 2019-11-05 NOTE — ED Triage Notes (Signed)
Pt comes in complaining of a tick bite on the left lower leg 3 weeks ago. Went to urgent care and given script for abx. States that she doesn't think its working because since Saturday there was  Been swelling going down her leg. Increased in redness daily.

## 2019-11-05 NOTE — Progress Notes (Signed)
Pharmacy Antibiotic Note  [name] is a 59 y.o. female admitted on 11/05/2019 with cellulitis and tick bite on the left lower leg from 3 weeks ago. She was prescribed amoxicillin on 6/26 from urgent care. Pharmacy has been consulted for vancomycin dosing.   Plan: Vancomycin 2,000 mg IV x 1, followed by vancomycin 1,000 IV every 12 hours.  Goal trough 10-15 mcg/mL.  Monitor renal function, cultures, and vancomycin levels at steady state.  Height: 5\' 7"  (170.2 cm) Weight: 90.4 kg (199 lb 4.7 oz) IBW/kg (Calculated) : 61.6  Temp (24hrs), Avg:99.1 F (37.3 C), Min:99.1 F (37.3 C), Max:99.1 F (37.3 C)  Recent Labs  Lab 11/05/19 2002  WBC 14.1*    CrCl cannot be calculated (Patient's most recent lab result is older than the maximum 21 days allowed.).    Allergies  Allergen Reactions  . Nsaids Other (See Comments)    LYMPHOCYTIC COLITIS  . Soap Shortness Of Breath and Rash    IVORY SOAP  . Augmentin [Amoxicillin-Pot Clavulanate] Nausea And Vomiting    Has patient had a PCN reaction causing immediate rash, facial/tongue/throat swelling, SOB or lightheadedness with hypotension: Unknown Has patient had a PCN reaction causing severe rash involving mucus membranes or skin necrosis: Unknown Has patient had a PCN reaction that required hospitalization Unknown Has patient had a PCN reaction occurring within the last 10 years: Unknown If all of the above answers are "NO", then may proceed with Cephalosporin use.   . Latex Itching and Other (See Comments)    BLISTERS  . Tape Other (See Comments)    BLISTERS Other reaction(s): Other (See Comments) BLISTERS  . Bupropion     Other reaction(s): Other (See Comments) Dry mouth and headaches  . Duloxetine Diarrhea  . Phenergan [Promethazine Hcl]     rash  . Chlorhexidine Gluconate Rash    Pt says her skin looked like poison ivy  . Cymbalta [Duloxetine Hcl] Nausea Only  . Mupirocin Rash    Antimicrobials this  admission: Vancomycin 6/30 >>  Dose adjustments this admission: None  Microbiology results: None  Thank you for allowing pharmacy to be a part of this patient's care.  Vertis Kelch, PharmD, BCPS Phone [phone] 11/05/2019       8:45 PM  Please check [email or web address] for unit-specific pharmacist phone numbers

## 2019-11-05 NOTE — Progress Notes (Signed)
Pharmacy Antibiotic Note  [name] is a 59 y.o. female admitted on 11/05/2019 with cellulitis.  Pharmacy has been consulted for Cefepime dosing. Already on Vancomycin. WBC elevated. Scr 1.11.   Plan: Already on vancomycin  Add Cefepime 2g IV q8h Trend WBC, temp, renal function  Height: 5\' 7"  (170.2 cm) Weight: 87.4 kg (192 lb 10.9 oz) IBW/kg (Calculated) : 61.6  Temp (24hrs), Avg:99.3 F (37.4 C), Min:99.1 F (37.3 C), Max:99.4 F (37.4 C)  Recent Labs  Lab 11/05/19 1958 11/05/19 2002  WBC  --  14.1*  CREATININE  --  1.11*  LATICACIDVEN 1.2  --     Estimated Creatinine Clearance: 62.7 mL/min (A) (by C-G formula based on SCr of 1.11 mg/dL (H)).    Allergies  Allergen Reactions  . Nsaids Other (See Comments)    LYMPHOCYTIC COLITIS  . Soap Shortness Of Breath and Rash    IVORY SOAP  . Augmentin [Amoxicillin-Pot Clavulanate] Nausea And Vomiting  . Latex Itching and Other (See Comments)    BLISTERS  . Tape Other (See Comments)    BLISTERS   . Bupropion     Other reaction(s): Other (See Comments) Dry mouth and headaches  . Doxycycline Nausea And Vomiting  . Duloxetine Diarrhea and Nausea Only  . Chlorhexidine Gluconate Rash    Pt says her skin looked like poison ivy  . Mupirocin Rash  . Phenergan [Promethazine Hcl] Rash    Narda Bonds, PharmD, BCPS Clinical Pharmacist Phone: [phone]

## 2019-11-05 NOTE — H&P (Signed)
History and Physical    Shanie Mauzy ZOX:[id number] DOB: [birth year] DOA: 11/05/2019  PCP: [name], MD   Patient coming from: Home  I have personally briefly reviewed patient's old medical records in [place]  Chief Complaint: Leg swelling and pain  HPI: [name] is a 59 y.o. female with medical history significant for breast cancer, hypertension, depression, septic shock. Patient presented to the ED with complaints of left leg pain and swelling ongoing and progressive over the past 3 weeks.  Patient reports about 3 weeks ago, she was bitten by a tick on same leg, when she went to visit family in [place].  She saw the tick, and it was pulled out.  Surrounding skin gradually started getting red and progressed downwards to involve her whole leg. Patient went to an urgent care about 3 days ago, she was given a dose of IV ceftriaxone and discharged home on a course of amoxicillin and Bactrim.  Patient reports compliance with medication, reports redness continues to spread.  She reports purulent drainage from the site of the bite.  No fevers no chills.  She reports chronic diarrhea that is unchanged.  ED Course: Temperature 99.1.  Otherwise stable vitals.  WBC 14.1.  Lactic acid 1.2.  Sodium 135.  Potassium 2.9.  Creatinine 1.11.  Patient was started on IV vancomycin.  Hospitalist to admit for left lower leg cellulitis with failure of outpatient treatment.  Review of Systems: As per HPI all other systems reviewed and negative.  Past Medical History:  Diagnosis Date  . Anxiety   . Arthritis    lower back  . Breast cancer ([place])   . Breast cancer of upper-outer quadrant of left female breast ([place]) 01/03/2016  . Complication of anesthesia    hx. of combativeness as child; had severe migraine headache day after back surgery  . Cough due to ACE inhibitor 01/28/2016  . Depression   . Eczema    right hip  . High cholesterol   . History of radiation therapy 05/29/16-  07/14/16   Left Breast/ 50.4 Gy n 25 fractions, Left Breast Boost/ 10 Gy in 5 fractions.   . Hypertension    states under control with meds., has been on med. since age 25  . Lymphocytic colitis   . Migraines   . Personal history of radiation therapy   . PONV (postoperative nausea and vomiting)   . Sciatica   . Sinus drainage 01/28/2016  . Urinary urgency     Past Surgical History:  Procedure Laterality Date  . ANTERIOR LAT LUMBAR FUSION Left 07/11/2017   Procedure: LEFT EXTREME LATERAL INTERBODY FUSION AND INSTRUMENTATION AND ALLOGRAFT LUMBAR 1-2/2-3;  Surgeon: Phylliss Bob, MD;  Location: [facility];  Service: Orthopedics;  Laterality: Left;  . BREAST BIOPSY    . BREAST LUMPECTOMY     left 2017  . BREAST LUMPECTOMY WITH NEEDLE LOCALIZATION AND AXILLARY SENTINEL LYMPH NODE BX Left 02/04/2016   Procedure: LEFT BREAST LUMPECTOMY WITH 2 NEEDLE LOCALIZATIONS AND LEFT AXILLARY SENTINEL LYMPH NODE BIOPSY;  Surgeon: [name], MD;  Location: [facility];  Service: General;  Laterality: Left;  LEFT BREAST LUMPECTOMY WITH 2 NEEDLE LOCALIZATIONS AND LEFT AXILLARY SENTINEL LYMPH NODE BIOPSY  . [place]  . COLONOSCOPY     Hx: of  . CYSTO  09/29/2005   with anterior repair  . ENDOMETRIAL ABLATION  09/29/2005  . INCISION AND DRAINAGE ABSCESS Left 02/21/2016   Procedure: INCISION AND DRAINAGE ABSCESS;  Surgeon: Excell Seltzer, MD;  Location: [facility];  Service: General;  Laterality: Left;  . INCONTINENCE SURGERY  09/29/2005  . KNEE ARTHROSCOPY W/ DEBRIDEMENT    . LUMBAR FUSION  04/28/2013   L3-4  . TONSILLECTOMY     age 38  . TUBAL LIGATION    . WISDOM TOOTH EXTRACTION     as a teenager     reports that she has been smoking cigarettes. She has a 12.00 pack-year smoking history. She has never used smokeless tobacco. She reports that she does not drink alcohol and does not use drugs.  Allergies  Allergen Reactions  . Nsaids Other (See Comments)     LYMPHOCYTIC COLITIS  . Soap Shortness Of Breath and Rash    IVORY SOAP  . Augmentin [Amoxicillin-Pot Clavulanate] Nausea And Vomiting  . Latex Itching and Other (See Comments)    BLISTERS  . Tape Other (See Comments)    BLISTERS   . Bupropion     Other reaction(s): Other (See Comments) Dry mouth and headaches  . Doxycycline Nausea And Vomiting  . Duloxetine Diarrhea and Nausea Only  . Chlorhexidine Gluconate Rash    Pt says her skin looked like poison ivy  . Mupirocin Rash  . Phenergan [Promethazine Hcl] Rash    Family History  Problem Relation Age of Onset  . Colon cancer Father        20-65  . Hypertension Father   . Colon polyps Other   . Diabetes Other   . Breast cancer Paternal Aunt      Prior to Admission medications   Medication Sig Start Date End Date Taking? Authorizing Provider  amLODipine (NORVASC) 5 MG tablet Take 5 mg by mouth daily.   Yes [provider]  amoxicillin (AMOXIL) 500 MG capsule Take 500 mg by mouth 3 (three) times daily. 10 day course starting on 11/02/2019   Yes [provider]  ascorbic acid (VITAMIN C) 500 MG tablet Take 500 mg by mouth daily.   Yes [provider]  budesonide (ENTOCORT EC) 3 MG 24 hr capsule Take 3 capsules (9 mg total) by mouth daily. 11/27/18  Yes Ladene Artist, MD  cholecalciferol (VITAMIN D3) 25 MCG (1000 UNIT) tablet Take 1,000 Units by mouth daily.   Yes [provider]  ELDERBERRY PO Take 1 tablet by mouth daily.   Yes [provider]  letrozole (FEMARA) 2.5 MG tablet Take 1 tablet (2.5 mg total) by mouth daily. 06/30/19  Yes Nicholas Lose, MD  losartan-hydrochlorothiazide (HYZAAR) 100-12.5 MG tablet Take 1 tablet by mouth at bedtime.  10/16/16  Yes [provider]  Multiple Vitamin (MULTIVITAMIN WITH MINERALS) TABS tablet Take 1 tablet by mouth daily.   Yes [provider]  ondansetron (ZOFRAN) 4 MG tablet Take 4 mg by mouth 3 (three) times daily as needed  for nausea.  03/18/18  Yes [provider]  sulfamethoxazole-trimethoprim (BACTRIM) 400-80 MG tablet Take 1 tablet by mouth 2 (two) times daily. 10 day course starting on 11/02/2019   Yes [provider]    Physical Exam: Vitals:   11/05/19 1920 11/05/19 1921 11/05/19 2146  BP: (!) 148/88  120/85  Pulse: 89  81  Resp: 17  20  Temp: 99.1 F (37.3 C)    TempSrc: Oral    SpO2: 100%  97%  Weight:  90.4 kg   Height:  5\' 7"  (1.702 m)     Constitutional: NAD, calm, comfortable Vitals:   11/05/19 1920 11/05/19 1921  11/05/19 2146  BP: (!) 148/88  120/85  Pulse: 89  81  Resp: 17  20  Temp: 99.1 F (37.3 C)    TempSrc: Oral    SpO2: 100%  97%  Weight:  90.4 kg   Height:  5\' 7"  (1.702 m)    Eyes:  lids and conjunctivae normal ENMT: Mucous membranes are moist.  Neck: normal, supple, no masses, no thyromegaly Respiratory: clear to auscultation bilaterally, no wheezing, no crackles. Normal respiratory effort. No accessory muscle use.  Cardiovascular: Regular rate and rhythm,  No extremity edema. 2+ pedal pulses.   Abdomen: no tenderness, no masses palpated. No hepatosplenomegaly. Bowel sounds positive.  Musculoskeletal: no clubbing / cyanosis. No joint deformity upper and lower extremities. Good ROM, no contractures. Normal muscle tone.  Skin: Erythema, tenderness, swelling differential warmth to the left lower extremity, punctate wound from tick bite is not, serosanguineous drainage on dressing. Neurologic: CN 2-12 grossly intact. Sensation intact, DTR normal. Strength 5/5 in all 4.  Psychiatric: Normal judgment and insight. Alert and oriented x 3. Normal mood.         Labs on Admission: I have personally reviewed following labs and imaging studies  CBC: Recent Labs  Lab 11/05/19 2002  WBC 14.1*  NEUTROABS 9.8*  HGB 14.5  HCT 43.2  MCV 90.9  PLT 389   Basic Metabolic Panel: Recent Labs  Lab 11/05/19 2002  NA 135  K 2.9*  CL 98  CO2 24  GLUCOSE  159*  BUN 19  CREATININE 1.11*  CALCIUM 9.1    EKG: NOne.  Assessment/Plan Principal Problem:   Cellulitis of leg, left Active Problems:   Hypertension   Breast cancer of upper-outer quadrant of left female breast ([place])    Left lower extremity cellulitis-likely from initial tick bite.  Now with erythema, differential warmth and tenderness, and reported purulent drainage.  WBC 14.1.  She rules out for sepsis.  Progression of symptoms after 3 days of amoxicillin and Bactrim as outpatient.  History of MRSA infection. -Left lower extremity venous Dopplers rule out DVT -IV Vanco and cefepime -BMP, CBC in the morning -1 L bolus given.  Hypokalemia-potassium 2.9.  Likely from antihypertensives HCTZ, and chronic diarrhea. -Replete K -BMP a.m. -Check magnesium  Hypertension-blood pressure stable -Resume losartan HCTZ, Norvasc  History of left breast cancer-follows with Dr. Lindi Adie.  Status post lumpectomy, and radiation therapy, currently on adjuvant antiestrogen with oral therapy. -Continue letrozole   DVT prophylaxis: Lovenox Code Status: Full code Family Communication: None at bedside Disposition Plan: ~ 2 days, clear discharge to home, pending improvement in cellulitis with IV antibiotics. Consults called: None Admission status: Inpatient, telemetry.I certify that at the point of admission it is my clinical judgment that the patient will require inpatient hospital care spanning beyond 2 midnights from the point of admission due to high intensity of service, high risk for further deterioration and high frequency of surveillance required. The following factors support the patient status of inpatient: Cellulitis requiring inpatient IV antibiotics as she has failed outpatient oral therapy.   Bethena Roys MD [facility]  11/05/2019, 11:11 PM

## 2019-11-06 ENCOUNTER — Inpatient Hospital Stay (HOSPITAL_COMMUNITY): Payer: BC Managed Care – PPO

## 2019-11-06 DIAGNOSIS — C50412 Malignant neoplasm of upper-outer quadrant of left female breast: Secondary | ICD-10-CM

## 2019-11-06 DIAGNOSIS — K52832 Lymphocytic colitis: Secondary | ICD-10-CM

## 2019-11-06 DIAGNOSIS — Z720 Tobacco use: Secondary | ICD-10-CM

## 2019-11-06 DIAGNOSIS — I10 Essential (primary) hypertension: Secondary | ICD-10-CM

## 2019-11-06 LAB — CBC
HCT: 44.5 % (ref 36.0–46.0)
Hemoglobin: 14.1 g/dL (ref 12.0–15.0)
MCH: 30.1 pg (ref 26.0–34.0)
MCHC: 31.7 g/dL (ref 30.0–36.0)
MCV: 95.1 fL (ref 80.0–100.0)
Platelets: 278 10*3/uL (ref 150–400)
RBC: 4.68 MIL/uL (ref 3.87–5.11)
RDW: 13.5 % (ref 11.5–15.5)
WBC: 12.1 10*3/uL — ABNORMAL HIGH (ref 4.0–10.5)
nRBC: 0 % (ref 0.0–0.2)

## 2019-11-06 LAB — BASIC METABOLIC PANEL
Anion gap: 12 (ref 5–15)
BUN: 16 mg/dL (ref 6–20)
CO2: 22 mmol/L (ref 22–32)
Calcium: 9.1 mg/dL (ref 8.9–10.3)
Chloride: 106 mmol/L (ref 98–111)
Creatinine, Ser: 0.76 mg/dL (ref 0.44–1.00)
GFR calc Af Amer: >60 mL/min (ref >60)
GFR calc non Af Amer: >60 mL/min (ref >60)
Glucose, Bld: 126 mg/dL — ABNORMAL HIGH (ref 70–99)
Potassium: 3.2 mmol/L — ABNORMAL LOW (ref 3.5–5.1)
Sodium: 140 mmol/L (ref 135–145)

## 2019-11-06 LAB — LACTIC ACID, PLASMA: Lactic Acid, Venous: 1.9 mmol/L (ref 0.5–1.9)

## 2019-11-06 LAB — HEMOGLOBIN A1C
Hgb A1c MFr Bld: 6.6 % — ABNORMAL HIGH (ref 4.8–5.6)
Mean Plasma Glucose: 142.72 mg/dL

## 2019-11-06 LAB — HIV ANTIBODY (ROUTINE TESTING W REFLEX): HIV Screen 4th Generation wRfx: NONREACTIVE

## 2019-11-06 LAB — GLUCOSE, CAPILLARY: Glucose-Capillary: 149 mg/dL — ABNORMAL HIGH (ref 70–99)

## 2019-11-06 MED ORDER — LACTATED RINGERS IV BOLUS
1000.0000 mL | Freq: Once | INTRAVENOUS | Status: AC
  Administered 2019-11-06: 1000 mL via INTRAVENOUS

## 2019-11-06 MED ORDER — SODIUM CHLORIDE 0.9 % IV BOLUS
500.0000 mL | Freq: Once | INTRAVENOUS | Status: AC
  Administered 2019-11-06: 500 mL via INTRAVENOUS

## 2019-11-06 MED ORDER — POTASSIUM CHLORIDE CRYS ER 20 MEQ PO TBCR
40.0000 meq | EXTENDED_RELEASE_TABLET | Freq: Once | ORAL | Status: AC
  Administered 2019-11-06: 40 meq via ORAL
  Filled 2019-11-06: qty 2

## 2019-11-06 MED ORDER — INSULIN ASPART 100 UNIT/ML ~~LOC~~ SOLN: 0.0000 [IU] | Freq: Three times a day (TID) | SUBCUTANEOUS | Status: DC

## 2019-11-06 MED ORDER — LACTATED RINGERS IV SOLN: INTRAVENOUS | Status: DC

## 2019-11-06 NOTE — Progress Notes (Signed)
PROGRESS NOTE  [name] NLZ:[id number] DOB: 11-Dec-1960 DOA: 11/05/2019 PCP: [name], MD  Brief History:  59 year old female with a history of left-sided breast cancer, depression, hypertension, hyperlipidemia, lymphocytic colitis presenting with left lower extremity pain and swelling.  It all started with a tick bite which her daughter-in-law pulled off approximately 3 weeks prior to this admission.  On 10/31/2019, the patient started feeling bad, and by the morning of 11/01/2019 she noted some erythema and edema and pain around the tick bite.  This was around the left popliteal fossa area.  She went to urgent care on 11/02/2019.  She was given a prescription for amoxicillin and Bactrim.  She was compliant with the medications.  She stated that the erythema continue to worsen with increasing pain and edema.  As result she presented for further evaluation.  The patient denied any fevers, chills, chest pain, shortness breath, hemoptysis, nausea, vomiting, diarrhea.  She continues to smoke 1 pack/day.  She has an over 20-pack-year history.  At the time of admission, she was noted to have WBC 14.1 with lactic acid 1.2.  She had a low-grade temperature of 9 9.4 F, but she was hemodynamically stable.  Oxygen saturation was 97% room air.  The patient was started on vancomycin and cefepime.  Assessment/Plan: Left lower extremity cellulitis -Patient failed outpatient antibiotics -Venous duplex rule out DVT -Continue IV vancomycin -Discontinue cefepime  Hypokalemia -Replete -Magnesium 2.2  Essential hypertension -Holding amlodipine, HCTZ, losartan secondary to soft blood pressure  Tobacco abuse -Tobacco cessation discussed  Left breast cancer -Invasive ductal carcinoma by biopsy -Patient follows Dr. Lindi Adie -s/p lumpectomy and XRT -continue letrozole  Lymphocytic colitis -continue home dose budesonide      Status is: Inpatient  Remains inpatient appropriate  because:IV treatments appropriate due to intensity of illness or inability to take PO   Dispo: The patient is from: Home              Anticipated d/c is to: Home              Anticipated d/c date is: 1 day              Patient currently is not medically stable to d/c.        Family Communication: no  Family at bedside  Consultants:  none  Code Status:  FULL   DVT Prophylaxis:[place] Lovenox   Procedures: As Listed in Progress Note Above  Antibiotics: vanco 6/30>>> Cefepime 6/30>>7/1    Subjective: Pt states   Objective: Vitals:   11/05/19 2300 11/05/19 2308 11/06/19 0254 11/06/19 0640  BP:  (!) 118/93 102/73 (!) 78/45  Pulse:  86 70 75  Resp:  20 20 20   Temp:  99.4 F (37.4 C) 98.7 F (37.1 C) 98.8 F (37.1 C)  TempSrc:  Oral Oral Oral  SpO2:  97% 96% 98%  Weight: 87.4 kg     Height: 5\' 7"  (1.702 m)       Intake/Output Summary (Last 24 hours) at 11/06/2019 0706 Last data filed at 11/06/2019 0336 Gross per 24 hour  Intake 100 ml  Output --  Net 100 ml   Weight change:  Exam:   General:  Pt is alert, follows commands appropriately, not in acute distress  HEENT: No icterus, No thrush, No neck mass, [place]/AT  Cardiovascular: RRR, S1/S2, no rubs, no gallops  Respiratory: CTA bilaterally, no wheezing, no crackles, no rhonchi  Abdomen: Soft/+BS, non  tender, non distended, no guarding  Extremities: 1+ LLEedema with erythema.  Left popliteal fossa with serous yellow drainage.  No pus       Data Reviewed: I have personally reviewed following labs and imaging studies Basic Metabolic Panel: Recent Labs  Lab 11/05/19 2002 11/06/19 0440  NA 135 140  K 2.9* 3.2*  CL 98 106  CO2 24 22  GLUCOSE 159* 126*  BUN 19 16  CREATININE 1.11* 0.76  CALCIUM 9.1 9.1  MG 2.2  --    Liver Function Tests: No results for input(s): AST, ALT, ALKPHOS, BILITOT, PROT, ALBUMIN in the last 168 hours. No results for input(s): LIPASE, AMYLASE in the last 168 hours. No  results for input(s): AMMONIA in the last 168 hours. Coagulation Profile: No results for input(s): INR, PROTIME in the last 168 hours. CBC: Recent Labs  Lab 11/05/19 2002 11/06/19 0440  WBC 14.1* 12.1*  NEUTROABS 9.8*  --   HGB 14.5 14.1  HCT 43.2 44.5  MCV 90.9 95.1  PLT 310 278   Cardiac Enzymes: No results for input(s): CKTOTAL, CKMB, CKMBINDEX, TROPONINI in the last 168 hours. BNP: Invalid input(s): POCBNP CBG: No results for input(s): GLUCAP in the last 168 hours. HbA1C: No results for input(s): HGBA1C in the last 72 hours. Urine analysis:    Component Value Date/Time   COLORURINE YELLOW 02/20/2016 2221   APPEARANCEUR CLOUDY (A) 02/20/2016 2221   LABSPEC 1.018 02/20/2016 2221   PHURINE 5.5 02/20/2016 2221   GLUCOSEU NEGATIVE 02/20/2016 2221   HGBUR NEGATIVE 02/20/2016 2221   BILIRUBINUR NEGATIVE 02/20/2016 2221   KETONESUR NEGATIVE 02/20/2016 2221   PROTEINUR NEGATIVE 02/20/2016 2221   UROBILINOGEN 0.2 04/18/2013 0950   NITRITE NEGATIVE 02/20/2016 2221   LEUKOCYTESUR NEGATIVE 02/20/2016 2221   Sepsis Labs: @LABRCNTIP (procalcitonin:4,lacticidven:4) ) Recent Results (from the past 240 hour(s))  SARS Coronavirus 2 by RT PCR (hospital order, performed in [facility] hospital lab) Nasopharyngeal Nasopharyngeal Swab     Status: None   Collection Time: 11/05/19  7:51 PM   Specimen: Nasopharyngeal Swab  Result Value Ref Range Status   SARS Coronavirus 2 NEGATIVE NEGATIVE Final    Comment: (NOTE) SARS-CoV-2 target nucleic acids are NOT DETECTED.  The SARS-CoV-2 RNA is generally detectable in upper and lower respiratory specimens during the acute phase of infection. The lowest concentration of SARS-CoV-2 viral copies this assay can detect is 250 copies / mL. A negative result does not preclude SARS-CoV-2 infection and should not be used as the sole basis for treatment or other patient management decisions.  A negative result may occur with improper specimen  collection / handling, submission of specimen other than nasopharyngeal swab, presence of viral mutation(s) within the areas targeted by this assay, and inadequate number of viral copies (<250 copies / mL). A negative result must be combined with clinical observations, patient history, and epidemiological information.  Fact Sheet for Patients:   StrictlyIdeas.no  Fact Sheet for Healthcare Providers: BankingDealers.co.za  This test is not yet approved or  cleared by the [place] FDA and has been authorized for detection and/or diagnosis of SARS-CoV-2 by FDA under an Emergency Use Authorization (EUA).  This EUA will remain in effect (meaning this test can be used) for the duration of the COVID-19 declaration under Section 564(b)(1) of the Act, 21 U.S.C. section 360bbb-3(b)(1), unless the authorization is terminated or revoked sooner.  Performed at [facility], [address], [place], [place] [address]      Scheduled Meds: . amLODipine  5 mg Oral Daily  . budesonide  9 mg Oral Daily  . enoxaparin (LOVENOX) injection  40 mg Subcutaneous QHS  . hydrochlorothiazide  12.5 mg Oral QHS  . letrozole  2.5 mg Oral Daily  . losartan  100 mg Oral QHS   Continuous Infusions: . ceFEPime (MAXIPIME) IV 2 g (11/06/19 0404)  . vancomycin      Procedures/Studies: No results found.  Orson Eva, DO  [facility]  If 7PM-7AM, please contact night-coverage [email or web address] Password [email or web address] 11/06/2019, 7:06 AM   LOS: 1 day

## 2019-11-07 DIAGNOSIS — A419 Sepsis, unspecified organism: Secondary | ICD-10-CM

## 2019-11-07 LAB — BASIC METABOLIC PANEL
Anion gap: 8 (ref 5–15)
BUN: 12 mg/dL (ref 6–20)
CO2: 26 mmol/L (ref 22–32)
Calcium: 9.1 mg/dL (ref 8.9–10.3)
Chloride: 108 mmol/L (ref 98–111)
Creatinine, Ser: 0.61 mg/dL (ref 0.44–1.00)
GFR calc Af Amer: >60 mL/min (ref >60)
GFR calc non Af Amer: >60 mL/min (ref >60)
Glucose, Bld: 147 mg/dL — ABNORMAL HIGH (ref 70–99)
Potassium: 4.2 mmol/L (ref 3.5–5.1)
Sodium: 142 mmol/L (ref 135–145)

## 2019-11-07 LAB — CBC
HCT: 42.8 % (ref 36.0–46.0)
Hemoglobin: 13.8 g/dL (ref 12.0–15.0)
MCH: 30.7 pg (ref 26.0–34.0)
MCHC: 32.2 g/dL (ref 30.0–36.0)
MCV: 95.1 fL (ref 80.0–100.0)
Platelets: 279 10*3/uL (ref 150–400)
RBC: 4.5 MIL/uL (ref 3.87–5.11)
RDW: 13.6 % (ref 11.5–15.5)
WBC: 10 10*3/uL (ref 4.0–10.5)
nRBC: 0 % (ref 0.0–0.2)

## 2019-11-07 LAB — MAGNESIUM: Magnesium: 2.3 mg/dL (ref 1.7–2.4)

## 2019-11-07 LAB — HEMOGLOBIN A1C
Hgb A1c MFr Bld: 6.6 % — ABNORMAL HIGH (ref 4.8–5.6)
Mean Plasma Glucose: 142.72 mg/dL

## 2019-11-07 MED ORDER — CEPHALEXIN 500 MG PO CAPS: 500.0000 mg | ORAL_CAPSULE | Freq: Four times a day (QID) | ORAL | 0 refills | Status: DC

## 2019-11-07 MED ORDER — SULFAMETHOXAZOLE-TRIMETHOPRIM 800-160 MG PO TABS: 2.0000 | ORAL_TABLET | Freq: Two times a day (BID) | ORAL | 0 refills | Status: DC

## 2019-11-07 MED ORDER — SULFAMETHOXAZOLE-TRIMETHOPRIM 800-160 MG PO TABS: 2.0000 | ORAL_TABLET | Freq: Two times a day (BID) | ORAL | Status: DC

## 2019-11-07 NOTE — Care Management Important Message (Signed)
Important Message  Patient Details  Name: [name] MRN: [id number] Date of Birth: 17-Sep-1960   Medicare Important Message Given:  Yes     Natasha Bence, LCSW 11/07/2019, 12:31 PM

## 2019-11-07 NOTE — Discharge Summary (Addendum)
Physician Discharge Summary  [name] VXB:[id number] DOB: 1960-11-14 DOA: 11/05/2019  PCP: [name], MD  Admit date: 11/05/2019 Discharge date: 11/07/2019  Admitted From: Home Disposition:  Home   Recommendations for Outpatient Follow-up:  1. Follow up with PCP in 1-2 weeks 2. Please obtain BMP/CBC in one week    Discharge Condition: Stable CODE STATUS: FULL Diet recommendation: Heart Healthy / Carb Modified   Brief/Interim Summary: 59 year old female with a history of left-sided breast cancer, depression, hypertension, hyperlipidemia, lymphocytic colitis presenting with left lower extremity pain and swelling.  It all started with a tick bite which her daughter-in-law pulled off approximately 3 weeks prior to this admission.  On 10/31/2019, the patient started feeling bad, and by the morning of 11/01/2019 she noted some erythema and edema and pain around the tick bite.  This was around the left popliteal fossa area.  She went to urgent care on 11/02/2019.  She was given a prescription for amoxicillin and Bactrim.  She was compliant with the medications.  She stated that the erythema continue to worsen with increasing pain and edema.  As result she presented for further evaluation.  The patient denied any fevers, chills, chest pain, shortness breath, hemoptysis, nausea, vomiting, diarrhea.  She continues to smoke 1 pack/day.  She has an over 20-pack-year history.  At the time of admission, she was noted to have WBC 14.1 with lactic acid 1.2.  She had a low-grade temperature of 9 9.4 F, but she was hemodynamically stable.  Oxygen saturation was 97% room air.  The patient was started on vancomycin and cefepime which was de-escalated to vanco monotherapy.  She improved clinically and was d/c home on bactrim DS, 2 tabs bid and cephalexin x 6 more days.  Discharge Diagnoses:  Left lower extremity cellulitis -Patient failed outpatient antibiotics -Venous duplex rule out  DVT--neg -Continued IV vancomycin -Discontinued cefepime -d/c home with Bactrim DS, 2 tabs bid and cephalexin x 6 more days  Sepsis -due to cellulitis -present on admission -presented with leukocytosis and hypotension -sepsis physiology resolved -Lactic peaked 1.9 -improved with IV abx and IVF  Hypokalemia -Repleted -Magnesium 2.2  Essential hypertension -Holding amlodipine, HCTZ, losartan secondary to soft blood pressure -restart amlodipine -will not restart losartan and HCTZ due to risk of AKI with Bactrim and well controlled BP->follow up with PCP  Tobacco abuse -Tobacco cessation discussed  Left breast cancer -Invasive ductal carcinoma by biopsy -Patient follows Dr. Lindi Adie -s/p lumpectomy and XRT -continue letrozole  Lymphocytic colitis -continue home dose budesonide  Diabetes Mellitus type 2 -diet controlled -HbA1C--6.6      Discharge Instructions   Allergies as of 11/07/2019      Reactions   Nsaids Other (See Comments)   LYMPHOCYTIC COLITIS   Soap Shortness Of Breath, Rash   IVORY SOAP   Augmentin [amoxicillin-pot Clavulanate] Nausea And Vomiting   Latex Itching, Other (See Comments)   BLISTERS   Tape Other (See Comments)   BLISTERS   Bupropion    Other reaction(s): Other (See Comments) Dry mouth and headaches   Doxycycline Nausea And Vomiting   Duloxetine Diarrhea, Nausea Only   Chlorhexidine Gluconate Rash   Pt says her skin looked like poison ivy   Mupirocin Rash   Phenergan [promethazine Hcl] Rash      Medication List    STOP taking these medications   amoxicillin 500 MG capsule Commonly known as: AMOXIL   losartan-hydrochlorothiazide 100-12.5 MG tablet Commonly known as: HYZAAR   sulfamethoxazole-trimethoprim 400-80 MG tablet  Commonly known as: BACTRIM Replaced by: sulfamethoxazole-trimethoprim 800-160 MG tablet     TAKE these medications   amLODipine 5 MG tablet Commonly known as: NORVASC Take 5 mg by mouth daily.    ascorbic acid 500 MG tablet Commonly known as: VITAMIN C Take 500 mg by mouth daily.   budesonide 3 MG 24 hr capsule Commonly known as: ENTOCORT EC Take 3 capsules (9 mg total) by mouth daily.   cephALEXin 500 MG capsule Commonly known as: KEFLEX Take 1 capsule (500 mg total) by mouth 4 (four) times daily.   cholecalciferol 25 MCG (1000 UNIT) tablet Commonly known as: VITAMIN D3 Take 1,000 Units by mouth daily.   ELDERBERRY PO Take 1 tablet by mouth daily.   letrozole 2.5 MG tablet Commonly known as: FEMARA Take 1 tablet (2.5 mg total) by mouth daily.   multivitamin with minerals Tabs tablet Take 1 tablet by mouth daily.   ondansetron 4 MG tablet Commonly known as: ZOFRAN Take 4 mg by mouth 3 (three) times daily as needed for nausea.   sulfamethoxazole-trimethoprim 800-160 MG tablet Commonly known as: BACTRIM DS Take 2 tablets by mouth every 12 (twelve) hours. Replaces: sulfamethoxazole-trimethoprim 400-80 MG tablet       Allergies  Allergen Reactions  . Nsaids Other (See Comments)    LYMPHOCYTIC COLITIS  . Soap Shortness Of Breath and Rash    IVORY SOAP  . Augmentin [Amoxicillin-Pot Clavulanate] Nausea And Vomiting  . Latex Itching and Other (See Comments)    BLISTERS  . Tape Other (See Comments)    BLISTERS   . Bupropion     Other reaction(s): Other (See Comments) Dry mouth and headaches  . Doxycycline Nausea And Vomiting  . Duloxetine Diarrhea and Nausea Only  . Chlorhexidine Gluconate Rash    Pt says her skin looked like poison ivy  . Mupirocin Rash  . Phenergan [Promethazine Hcl] Rash    Consultations:  none   Procedures/Studies: US Venous Img Lower Unilateral Left (DVT)  Result Date: 11/06/2019 CLINICAL DATA:  59 year old female with a history of left leg pain and edema EXAM: LEFT LOWER EXTREMITY VENOUS DOPPLER ULTRASOUND TECHNIQUE: Gray-scale sonography with graded compression, as well as color Doppler and duplex ultrasound were performed  to evaluate the lower extremity deep venous systems from the level of the common femoral vein and including the common femoral, femoral, profunda femoral, popliteal and calf veins including the posterior tibial, peroneal and gastrocnemius veins when visible. The superficial great saphenous vein was also interrogated. Spectral Doppler was utilized to evaluate flow at rest and with distal augmentation maneuvers in the common femoral, femoral and popliteal veins. COMPARISON:  None. FINDINGS: Contralateral Common Femoral Vein: Respiratory phasicity is normal and symmetric with the symptomatic side. No evidence of thrombus. Normal compressibility. Common Femoral Vein: No evidence of thrombus. Normal compressibility, respiratory phasicity and response to augmentation. Saphenofemoral Junction: No evidence of thrombus. Normal compressibility and flow on color Doppler imaging. Profunda Femoral Vein: No evidence of thrombus. Normal compressibility and flow on color Doppler imaging. Femoral Vein: No evidence of thrombus. Normal compressibility, respiratory phasicity and response to augmentation. Popliteal Vein: No evidence of thrombus. Normal compressibility, respiratory phasicity and response to augmentation. Calf Veins: No evidence of thrombus. Normal compressibility and flow on color Doppler imaging. Superficial Great Saphenous Vein: No evidence of thrombus. Normal compressibility and flow on color Doppler imaging. Other Findings:  None. IMPRESSION: Sonographic survey of the left lower extremity negative for DVT Electronically Signed   By: Corrie Mckusick  D.O.   On: 11/06/2019 11:17         Discharge Exam: Vitals:   11/06/19 2056 11/07/19 0426  BP: (!) 114/96 129/87  Pulse: 79 77  Resp: 16 16  Temp: 97.6 F (36.4 C) 98.1 F (36.7 C)  SpO2: 100% 99%   Vitals:   11/06/19 1300 11/06/19 1700 11/06/19 2056 11/07/19 0426  BP: 104/73 109/78 (!) 114/96 129/87  Pulse: 75 80 79 77  Resp: 17 18 16 16   Temp: 99.2  F (37.3 C) 98.9 F (37.2 C) 97.6 F (36.4 C) 98.1 F (36.7 C)  TempSrc: Oral Oral Oral Oral  SpO2: 97% 100% 100% 99%  Weight:      Height:        General: Pt is alert, awake, not in acute distress Cardiovascular: RRR, S1/S2 +, no rubs, no gallops Respiratory: CTA bilaterally, no wheezing, no rhonchi Abdominal: Soft, NT, ND, bowel sounds + Extremities: trace LLE edema, no cyanosis; mild erythema without crepitance or necrosis.   The results of significant diagnostics from this hospitalization (including imaging, microbiology, ancillary and laboratory) are listed below for reference.    Significant Diagnostic Studies: US Venous Img Lower Unilateral Left (DVT)  Result Date: 11/06/2019 CLINICAL DATA:  59 year old female with a history of left leg pain and edema EXAM: LEFT LOWER EXTREMITY VENOUS DOPPLER ULTRASOUND TECHNIQUE: Gray-scale sonography with graded compression, as well as color Doppler and duplex ultrasound were performed to evaluate the lower extremity deep venous systems from the level of the common femoral vein and including the common femoral, femoral, profunda femoral, popliteal and calf veins including the posterior tibial, peroneal and gastrocnemius veins when visible. The superficial great saphenous vein was also interrogated. Spectral Doppler was utilized to evaluate flow at rest and with distal augmentation maneuvers in the common femoral, femoral and popliteal veins. COMPARISON:  None. FINDINGS: Contralateral Common Femoral Vein: Respiratory phasicity is normal and symmetric with the symptomatic side. No evidence of thrombus. Normal compressibility. Common Femoral Vein: No evidence of thrombus. Normal compressibility, respiratory phasicity and response to augmentation. Saphenofemoral Junction: No evidence of thrombus. Normal compressibility and flow on color Doppler imaging. Profunda Femoral Vein: No evidence of thrombus. Normal compressibility and flow on color Doppler  imaging. Femoral Vein: No evidence of thrombus. Normal compressibility, respiratory phasicity and response to augmentation. Popliteal Vein: No evidence of thrombus. Normal compressibility, respiratory phasicity and response to augmentation. Calf Veins: No evidence of thrombus. Normal compressibility and flow on color Doppler imaging. Superficial Great Saphenous Vein: No evidence of thrombus. Normal compressibility and flow on color Doppler imaging. Other Findings:  None. IMPRESSION: Sonographic survey of the left lower extremity negative for DVT Electronically Signed   By: Corrie Mckusick D.O.   On: 11/06/2019 11:17     Microbiology: Recent Results (from the past 240 hour(s))  SARS Coronavirus 2 by RT PCR (hospital order, performed in [facility] hospital lab) Nasopharyngeal Nasopharyngeal Swab     Status: None   Collection Time: 11/05/19  7:51 PM   Specimen: Nasopharyngeal Swab  Result Value Ref Range Status   SARS Coronavirus 2 NEGATIVE NEGATIVE Final    Comment: (NOTE) SARS-CoV-2 target nucleic acids are NOT DETECTED.  The SARS-CoV-2 RNA is generally detectable in upper and lower respiratory specimens during the acute phase of infection. The lowest concentration of SARS-CoV-2 viral copies this assay can detect is 250 copies / mL. A negative result does not preclude SARS-CoV-2 infection and should not be used as the sole basis for treatment or  other patient management decisions.  A negative result may occur with improper specimen collection / handling, submission of specimen other than nasopharyngeal swab, presence of viral mutation(s) within the areas targeted by this assay, and inadequate number of viral copies (<250 copies / mL). A negative result must be combined with clinical observations, patient history, and epidemiological information.  Fact Sheet for Patients:   StrictlyIdeas.no  Fact Sheet for Healthcare  Providers: BankingDealers.co.za  This test is not yet approved or  cleared by the [place] FDA and has been authorized for detection and/or diagnosis of SARS-CoV-2 by FDA under an Emergency Use Authorization (EUA).  This EUA will remain in effect (meaning this test can be used) for the duration of the COVID-19 declaration under Section 564(b)(1) of the Act, 21 U.S.C. section 360bbb-3(b)(1), unless the authorization is terminated or revoked sooner.  Performed at [facility], [address], [place], [place] [address]      Labs: Basic Metabolic Panel: Recent Labs  Lab 11/05/19 2002 11/05/19 2002 11/06/19 0440 11/07/19 0453  NA 135  --  140 142  K 2.9*   < > 3.2* 4.2  CL 98  --  106 108  CO2 24  --  22 26  GLUCOSE 159*  --  126* 147*  BUN 19  --  16 12  CREATININE 1.11*  --  0.76 0.61  CALCIUM 9.1  --  9.1 9.1  MG 2.2  --   --  2.3   < > = values in this interval not displayed.   Liver Function Tests: No results for input(s): AST, ALT, ALKPHOS, BILITOT, PROT, ALBUMIN in the last 168 hours. No results for input(s): LIPASE, AMYLASE in the last 168 hours. No results for input(s): AMMONIA in the last 168 hours. CBC: Recent Labs  Lab 11/05/19 2002 11/06/19 0440 11/07/19 0453  WBC 14.1* 12.1* 10.0  NEUTROABS 9.8*  --   --   HGB 14.5 14.1 13.8  HCT 43.2 44.5 42.8  MCV 90.9 95.1 95.1  PLT 310 278 279   Cardiac Enzymes: No results for input(s): CKTOTAL, CKMB, CKMBINDEX, TROPONINI in the last 168 hours. BNP: Invalid input(s): POCBNP CBG: Recent Labs  Lab 11/06/19 0834  GLUCAP 149*    Time coordinating discharge:  36 minutes  Signed:  Orson Eva, DO [facility] Pager: [phone] 11/07/2019, 8:42 AM

## 2019-12-15 ENCOUNTER — Telehealth: Payer: Self-pay | Admitting: Hematology and Oncology

## 2019-12-15 NOTE — Telephone Encounter (Signed)
Rescheduled appointments per 8/5 provider message. Left message with updated appointment date and time.

## 2019-12-19 ENCOUNTER — Other Ambulatory Visit: Payer: Self-pay | Admitting: Gastroenterology

## 2020-06-30 ENCOUNTER — Ambulatory Visit: Payer: Medicaid Other | Admitting: Hematology and Oncology

## 2020-07-01 ENCOUNTER — Inpatient Hospital Stay: Payer: BC Managed Care – PPO | Admitting: Hematology and Oncology

## 2020-08-01 NOTE — Progress Notes (Incomplete)
Patient Care Team: [name], MD as PCP - General (Family Medicine) [name], MD as Consulting Physician (General Surgery) Nicholas Lose, MD as Consulting Physician (Hematology and Oncology) Eppie Gibson, MD as Attending Physician (Radiation Oncology) Gardenia Phlegm, NP as Nurse Practitioner (Hematology and Oncology)  DIAGNOSIS: No diagnosis found.  SUMMARY OF ONCOLOGIC HISTORY: Oncology History  Breast cancer of upper-outer quadrant of left female breast ([place])  12/23/2015 Mammogram   Left breast 1.2 cm spiculated mass 2:00 position, 5 mm intramammary lymph node 2:00 position needs biopsy, 6 mm simple cyst at 1:00 position, T1c Nx stage IA   12/29/2015 Initial Diagnosis   Left breast biopsy UOQ 2:00 and centimeters from nipple: IDC, grade 1, ER 100%, PR 90%, Ki-67 10%, HER-2 negative ratio 1.16   02/04/2016 Surgery   Left lumpectomy: IDC grade 1, 1.1 cm, ADH, ALH,0/3 lymph nodes negative, margins negative,ER 100%, PR 90%, HER-2 negative ratio 1.16, Ki-67 10%, T1 CN 0 stage IA   02/04/2016 Oncotype testing   Oncotype DX score 13, risk of recurrence 9% with tamoxifen alone   05/29/2016 - 07/14/2016 Radiation Therapy   Adjuvant radiation therapy Isidore Moos): 1) Left Breast / 50.4 Gy in 25 fractions.  2) Left Breast Boost / 10 Gy in 5 fractions   09/2016 -  Anti-estrogen oral therapy   Letrozole daily     CHIEF COMPLIANT: Follow-up of left breast cancer on letrozole therapy  INTERVAL HISTORY: [name] is a 60 y.o. with above-mentioned history of left breast cancer who underwent a lumpectomy, radiation, and is currently on letrozole. She presents to the clinic today for annual follow-up.    ALLERGIES:  is allergic to nsaids, soap, augmentin [amoxicillin-pot clavulanate], latex, tape, bupropion, doxycycline, duloxetine, chlorhexidine gluconate, mupirocin, and phenergan [promethazine hcl].  MEDICATIONS:  Current Outpatient Medications  Medication Sig Dispense  Refill  . amLODipine (NORVASC) 5 MG tablet Take 5 mg by mouth daily.    Marland Kitchen ascorbic acid (VITAMIN C) 500 MG tablet Take 500 mg by mouth daily.    . budesonide (ENTOCORT EC) 3 MG 24 hr capsule TAKE THREE CAPSULES BY MOUTH DAILY 90 capsule 3  . cephALEXin (KEFLEX) 500 MG capsule Take 1 capsule (500 mg total) by mouth 4 (four) times daily. 24 capsule 0  . cholecalciferol (VITAMIN D3) 25 MCG (1000 UNIT) tablet Take 1,000 Units by mouth daily.    Marland Kitchen ELDERBERRY PO Take 1 tablet by mouth daily.    Marland Kitchen letrozole (FEMARA) 2.5 MG tablet Take 1 tablet (2.5 mg total) by mouth daily. 90 tablet 3  . Multiple Vitamin (MULTIVITAMIN WITH MINERALS) TABS tablet Take 1 tablet by mouth daily.    . ondansetron (ZOFRAN) 4 MG tablet Take 4 mg by mouth 3 (three) times daily as needed for nausea.     Marland Kitchen sulfamethoxazole-trimethoprim (BACTRIM DS) 800-160 MG tablet Take 2 tablets by mouth every 12 (twelve) hours. 24 tablet 0   Current Facility-Administered Medications  Medication Dose Route Frequency Provider Last Rate Last Admin  . 0.9 %  sodium chloride infusion  500 mL Intravenous Once Ladene Artist, MD        PHYSICAL EXAMINATION: ECOG PERFORMANCE STATUS: {CHL ONC ECOG MQ:[id number]}  There were no vitals filed for this visit. There were no vitals filed for this visit.  BREAST:*** No palpable masses or nodules in either right or left breasts. No palpable axillary supraclavicular or infraclavicular adenopathy no breast tenderness or nipple discharge. (exam performed in the presence of a chaperone)  LABORATORY  DATA:  I have reviewed the data as listed CMP Latest Ref Rng & Units 11/07/2019 11/06/2019 11/05/2019  Glucose 70 - 99 mg/dL 147(H) 126(H) 159(H)  BUN 6 - 20 mg/dL _0 Creatinine 0.44 - 1.00 mg/dL 0.61 0.76 1.11(H)  Sodium 135 - 145 mmol/L 142 140 135  Potassium 3.5 - 5.1 mmol/L 4.2 3.2(L) 2.9(L)  Chloride 98 - 111 mmol/L 108 106 98  CO2 22 - 32 mmol/L _1 Calcium 8.9 - 10.3 mg/dL 9.1 9.1 9.1   Total Protein 6.5 - 8.1 g/dL - - -  Total Bilirubin 0.3 - 1.2 mg/dL - - -  Alkaline Phos 38 - 126 U/L - - -  AST 15 - 41 U/L - - -  ALT 0 - 44 U/L - - -    Lab Results  Component Value Date   WBC 10.0 11/07/2019   HGB 13.8 11/07/2019   HCT 42.8 11/07/2019   MCV 95.1 11/07/2019   PLT 279 11/07/2019   NEUTROABS 9.8 (H) 11/05/2019    ASSESSMENT & PLAN:  No problem-specific Assessment & Plan notes found for this encounter.    No orders of the defined types were placed in this encounter.  The patient has a good understanding of the overall plan. she agrees with it. she will call with any problems that may develop before the next visit here.  Total time spent: *** mins including face to face time and time spent for planning, charting and coordination of care  Rulon Eisenmenger, MD, MPH 08/01/2020  I, Cloyde Reams Dorshimer, am acting as scribe for Dr. Nicholas Lose.  {insert scribe attestation}

## 2020-08-01 NOTE — Assessment & Plan Note (Deleted)
Breast cancer of upper-outer quadrant of left female breast ([place]) 12/23/2015: Left breast 1.2 cm spiculated mass 2:00 position, 5 mm intramammary lymph node 2:00 position needs biopsy, 6 mm simple cyst at 1:00 position, T1c Nx stage IA 12/29/2015: Left breast biopsy UOQ 2:00 and centimeters from nipple: IDC, grade 1, ER 100%, PR 90%, Ki-67 10%, HER-2 negative ratio 1.16 02/04/2016: Left lumpectomy: IDC grade 1, 1.1 cm, ADH, ALH,0/3 lymph nodes negative, margins negative,ER 100%, PR 90%, HER-2 negative ratio 1.16, Ki-67 10%, T1 CN 0 stage IA Oncotype DX score 13, risk of recurrence 9% with tamoxifen alone Adjuvant radiation therapy: 05/26/2016- 07/06/2016 ----------------------------------------------------------------------------------------------------------------------------------------------------- Treatment plan: Adjuvant antiestrogen therapy with letrozole 2.5 mg dailystarted May 2018 Letrozoletoxicities: 1.Hot flashes: Continues to be moderate to severe.  Multiple back issues: MRI spine 10/17/2017: L2-L3 stenosis Severe low back pain related to herniated disc follows with orthopedic surgery. Right leg pain Patient has chronic colitis She lost 35 pounds by watching what she eats.  She plans to lose another 50 more pounds.  Breast cancer surveillance: 1.Mammograms 07/08/2018: Benign, breast density category B 2.breast exam 08/02/20: Benign  Return to clinic in1 year for follow-up

## 2020-08-02 ENCOUNTER — Inpatient Hospital Stay: Payer: BC Managed Care – PPO | Attending: Hematology and Oncology | Admitting: Hematology and Oncology

## 2020-08-02 DIAGNOSIS — C50412 Malignant neoplasm of upper-outer quadrant of left female breast: Secondary | ICD-10-CM

## 2021-07-18 ENCOUNTER — Telehealth: Payer: Self-pay | Admitting: Specialist

## 2021-07-18 NOTE — Telephone Encounter (Signed)
Received vm from Desiree Lucy w/ [facility] stating they have not received records, to call [phone]. IC,lmvm advised [facility] records faxed 05/13/2021. ?

## 2021-07-25 ENCOUNTER — Telehealth: Payer: Self-pay | Admitting: Specialist

## 2021-07-25 NOTE — Telephone Encounter (Signed)
Refaxed [facility] records [phone] through email and land fax ?

## 2021-07-25 NOTE — Telephone Encounter (Signed)
Received vm from [employer] w/ [facility] stating she is having to leave a 2nd vm as no one returned her call. IC,lmvm advising that I called 3/13 and left her a detailed msg at 4:30 on [phone]. I again stated that I faxed pts records on 05/13/21 to [phone] ?

## 2022-06-14 ENCOUNTER — Encounter: Payer: Self-pay | Admitting: Gastroenterology

## 2022-12-29 DIAGNOSIS — I639 Cerebral infarction, unspecified: Secondary | ICD-10-CM

## 2022-12-29 HISTORY — DX: Cerebral infarction, unspecified: I63.9

## 2024-01-03 ENCOUNTER — Encounter: Payer: Self-pay | Admitting: Physician Assistant

## 2024-02-26 ENCOUNTER — Encounter: Payer: Self-pay | Admitting: Physician Assistant

## 2024-02-26 ENCOUNTER — Other Ambulatory Visit (INDEPENDENT_AMBULATORY_CARE_PROVIDER_SITE_OTHER)

## 2024-02-26 ENCOUNTER — Ambulatory Visit: Admitting: Physician Assistant

## 2024-02-26 VITALS — BP 118/70 | HR 76 | Ht 67.0 in | Wt 175.0 lb

## 2024-02-26 DIAGNOSIS — R159 Full incontinence of feces: Secondary | ICD-10-CM

## 2024-02-26 DIAGNOSIS — A498 Other bacterial infections of unspecified site: Secondary | ICD-10-CM

## 2024-02-26 DIAGNOSIS — K52832 Lymphocytic colitis: Secondary | ICD-10-CM

## 2024-02-26 DIAGNOSIS — Z800 Family history of malignant neoplasm of digestive organs: Secondary | ICD-10-CM

## 2024-02-26 DIAGNOSIS — R197 Diarrhea, unspecified: Secondary | ICD-10-CM

## 2024-02-26 DIAGNOSIS — D72829 Elevated white blood cell count, unspecified: Secondary | ICD-10-CM | POA: Diagnosis not present

## 2024-02-26 DIAGNOSIS — E876 Hypokalemia: Secondary | ICD-10-CM

## 2024-02-26 DIAGNOSIS — T3695XA Adverse effect of unspecified systemic antibiotic, initial encounter: Secondary | ICD-10-CM

## 2024-02-26 LAB — CBC WITH DIFFERENTIAL/PLATELET
Basophils Absolute: 0.1 K/uL (ref 0.0–0.1)
Basophils Relative: 0.6 % (ref 0.0–3.0)
Eosinophils Absolute: 0.5 K/uL (ref 0.0–0.7)
Eosinophils Relative: 2.8 % (ref 0.0–5.0)
HCT: 43.6 % (ref 36.0–46.0)
Hemoglobin: 14.6 g/dL (ref 12.0–15.0)
Lymphocytes Relative: 17.6 % (ref 12.0–46.0)
Lymphs Abs: 3.1 K/uL (ref 0.7–4.0)
MCHC: 33.4 g/dL (ref 30.0–36.0)
MCV: 87.2 fl (ref 78.0–100.0)
Monocytes Absolute: 0.7 K/uL (ref 0.1–1.0)
Monocytes Relative: 4.2 % (ref 3.0–12.0)
Neutro Abs: 13.3 K/uL — ABNORMAL HIGH (ref 1.4–7.7)
Neutrophils Relative %: 74.8 % (ref 43.0–77.0)
Platelets: 295 K/uL (ref 150.0–400.0)
RBC: 5 Mil/uL (ref 3.87–5.11)
RDW: 15 % (ref 11.5–15.5)
WBC: 17.8 K/uL — ABNORMAL HIGH (ref 4.0–10.5)

## 2024-02-26 MED ORDER — VANCOMYCIN HCL 125 MG PO CAPS: 125.0000 mg | ORAL_CAPSULE | Freq: Four times a day (QID) | ORAL | 0 refills | Status: AC

## 2024-02-26 NOTE — Progress Notes (Signed)
 Agree with assessment and plan as outlined.

## 2024-02-26 NOTE — Progress Notes (Addendum)
 Ellouise Console, PA-C [address] [place], [place]  [address] Phone: [phone]   Gastroenterology Consultation  Referring Provider:     Tanda Prentice DEL, MD Primary Care Physician:  Tanda Prentice DEL, MD Primary Gastroenterologist:  Ellouise Console, PA-C / [name], MD  Reason for Consultation:     Severe worsening diarrhea        HPI:   Discussed the use of AI scribe software for clinical note transcription with the patient, who gave verbal consent to proceed.  History of Present Illness [name] is a 63 year old female with lymphocytic colitis who presents with severe diarrhea and incontinence following antibiotic use.  She has had severe worsening diarrhea for 3 to 4 months.  She has a history of lymphocytic colitis, which previously caused manageable diarrhea, described as between 'soft serve and diarrhea,' occurring one to two times a day. She was able to control the urge and reach the bathroom without issues.  In July, she developed an infection in her right leg and was treated with clindamycin and Rocephin. 12/2023 she was treated with Augmentin  for cellulitis.  Since starting antibiotics, she has experienced uncontrollable diarrhea, going ten to twelve times a day, with incontinence when the urge arises. She describes the diarrhea as watery and foul-smelling, and it sometimes occurs when she urinates (nocturnal).  On September 16, she visited [facility] in [place], [place] due to leg swelling and was found to have an inflamed rectum on a CT scan. She has been taking probiotics with little benefit.  She has not had a stool test  to evaluate her diarrhea.  She has a history of cellulitis, which was diagnosed during a hospital visit for leg swelling. Her potassium was found to be low at 2.4, and she received potassium drips and a prescription for potassium supplementation for fourteen days. She has not had her potassium rechecked since then.  She has a history of  a stroke last year, which has led to mobility issues, and she moved to live with her daughter in [place] in July. She has been unable to see a primary care physician there until November. She is not currently on budesonide  due to previous cellulitis flare-ups associated with its use.  Her father had colon cancer, and she had a colonoscopy in 2019 with a recommendation for a repeat in five years. She is currently dealing with multiple health issues, including a referral to a cardiologist for leg artery studies and a potential neural stimulator implant, which she is postponing until her current issues are resolved.  No known antibiotic allergies but cannot tolerate doxycycline due to vomiting.   Past Medical History:  Diagnosis Date   Anxiety    Arthritis    lower back   Breast cancer (HCC)    Breast cancer of upper-outer quadrant of left female breast (HCC) 01/03/2016   Complication of anesthesia    hx. of combativeness as child; had severe migraine headache day after back surgery   Cough due to ACE inhibitor 01/28/2016   Depression    Eczema    right hip   High cholesterol    History of radiation therapy 05/29/16- 07/14/16   Left Breast/ 50.4 Gy n 25 fractions, Left Breast Boost/ 10 Gy in 5 fractions.    Hypertension    states under control with meds., has been on med. since age 89   Lymphocytic colitis    Migraines    Personal history of radiation  therapy    PONV (postoperative nausea and vomiting)    Sciatica    Sinus drainage 01/28/2016   Urinary urgency     Past Surgical History:  Procedure Laterality Date   ANTERIOR LAT LUMBAR FUSION Left 07/11/2017   Procedure: LEFT EXTREME LATERAL INTERBODY FUSION AND INSTRUMENTATION AND ALLOGRAFT LUMBAR 1-2/2-3;  Surgeon: Beuford Anes, MD;  Location: [facility] OR;  Service: Orthopedics;  Laterality: Left;   BREAST BIOPSY     BREAST LUMPECTOMY     left 2017   BREAST LUMPECTOMY WITH NEEDLE LOCALIZATION AND AXILLARY SENTINEL LYMPH NODE BX Left  02/04/2016   Procedure: LEFT BREAST LUMPECTOMY WITH 2 NEEDLE LOCALIZATIONS AND LEFT AXILLARY SENTINEL LYMPH NODE BIOPSY;  Surgeon: Alm Angle, MD;  Location: [facility];  Service: General;  Laterality: Left;  LEFT BREAST LUMPECTOMY WITH 2 NEEDLE LOCALIZATIONS AND LEFT AXILLARY SENTINEL LYMPH NODE BIOPSY   CESAREAN SECTION  1989, 1997   COLONOSCOPY     Hx: of   CYSTO  09/29/2005   with anterior repair   ENDOMETRIAL ABLATION  09/29/2005   INCISION AND DRAINAGE ABSCESS Left 02/21/2016   Procedure: INCISION AND DRAINAGE ABSCESS;  Surgeon: Morene Olives, MD;  Location: [facility];  Service: General;  Laterality: Left;   INCONTINENCE SURGERY  09/29/2005   KNEE ARTHROSCOPY W/ DEBRIDEMENT     LUMBAR FUSION  04/28/2013   L3-4   TONSILLECTOMY     age 54   TUBAL LIGATION     WISDOM TOOTH EXTRACTION     as a teenager    Prior to Admission medications   Medication Sig Start Date End Date Taking? Authorizing Provider  amLODipine  (NORVASC ) 5 MG tablet Take 5 mg by mouth daily.    [provider]  ascorbic acid (VITAMIN C) 500 MG tablet Take 500 mg by mouth daily.    [provider]  budesonide  (ENTOCORT EC ) 3 MG 24 hr capsule TAKE THREE CAPSULES BY MOUTH DAILY 12/19/19   Aneita Gwendlyn DASEN, MD  cephALEXin  (KEFLEX ) 500 MG capsule Take 1 capsule (500 mg total) by mouth 4 (four) times daily. 11/07/19   Evonnie Alm, MD  cholecalciferol  (VITAMIN D3) 25 MCG (1000 UNIT) tablet Take 1,000 Units by mouth daily.    [provider]  ELDERBERRY PO Take 1 tablet by mouth daily.    [provider]  letrozole  (FEMARA ) 2.5 MG tablet Take 1 tablet (2.5 mg total) by mouth daily. 06/30/19   Gudena, Vinay, MD  Multiple Vitamin (MULTIVITAMIN WITH MINERALS) TABS tablet Take 1 tablet by mouth daily.    [provider]  ondansetron  (ZOFRAN ) 4 MG tablet Take 4 mg by mouth 3 (three) times daily as needed for nausea.  03/18/18   [provider]   sulfamethoxazole -trimethoprim  (BACTRIM  DS) 800-160 MG tablet Take 2 tablets by mouth every 12 (twelve) hours. 11/07/19   Evonnie Alm, MD    Family History  Problem Relation Age of Onset   Colon cancer Father        25-65   Hypertension Father    Colon polyps Other    Diabetes Other    Breast cancer Paternal Aunt      Social History   Tobacco Use   Smoking status: Every Day    Current packs/day: 1.00    Average packs/day: 1 pack/day for 12.0 years (12.0 ttl pk-yrs)    Types: Cigarettes   Smokeless tobacco: Never   Tobacco comments:    Tobacco info given 04/04/17  Vaping Use  Vaping status: Never Used  Substance Use Topics   Alcohol use: No   Drug use: No    Allergies as of 02/26/2024 - Review Complete 02/26/2024  Allergen Reaction Noted   Nsaids Other (See Comments) 04/15/2013   Soap Shortness Of Breath and Rash 01/28/2016   Augmentin  [amoxicillin -pot clavulanate] Nausea And Vomiting 12/16/2015   Latex Itching and Other (See Comments)    Tape Other (See Comments) 04/28/2013   Bupropion  03/18/2018   Doxycycline Nausea And Vomiting 11/02/2019   Duloxetine Diarrhea and Nausea Only 05/09/2018   Chlorhexidine  gluconate Rash 02/21/2016   Mupirocin Rash 04/18/2013   Phenergan  [promethazine  hcl] Rash 07/14/2017    Review of Systems:    All systems reviewed and negative except where noted in HPI.   Physical Exam:  BP 118/70   Pulse 76   Ht 5' 7 (1.702 m)   Wt 175 lb (79.4 kg)   SpO2 99%   BMI 27.41 kg/m  No LMP recorded. Patient is postmenopausal.  General:   Alert,  Well-developed, well-nourished, pleasant and cooperative in NAD Lungs:  Respirations even and unlabored.  Clear throughout to auscultation.   No wheezes, crackles, or rhonchi. No acute distress. Heart:  Regular rate and rhythm; no murmurs, clicks, rubs, or gallops. Abdomen:  Normal bowel sounds.  No bruits.  Soft, and non-distended without masses, hepatosplenomegaly or hernias noted.  Mild bilateral  lower abdominal tenderness.  No upper abdominal tenderness.  No guarding or rebound tenderness.    Neurologic:  Alert and oriented x3;  grossly normal neurologically.  Limited mobility.  Walks with a walker. Psych:  Alert and cooperative. Normal mood and affect. Rectal: No external hemorrhoids or lesions.  Diatherix stool swab was obtained for C. difficile specimen.  Chaperone for Exam:  Alethea Blocker, CMA    Imaging Studies: No results found.  Labs: CBC    Component Value Date/Time   WBC 10.0 11/07/2019 0453   RBC 4.50 11/07/2019 0453   HGB 13.8 11/07/2019 0453   HCT 42.8 11/07/2019 0453   PLT 279 11/07/2019 0453   MCV 95.1 11/07/2019 0453    CMP     Component Value Date/Time   NA 142 11/07/2019 0453   K 4.2 11/07/2019 0453   CL 108 11/07/2019 0453   CO2 26 11/07/2019 0453   GLUCOSE 147 (H) 11/07/2019 0453   BUN 12 11/07/2019 0453   CREATININE 0.61 11/07/2019 0453   CALCIUM  9.1 11/07/2019 0453   PROT 7.0 12/10/2018 2053   ALBUMIN  3.2 (L) 12/10/2018 2053   AST 27 12/10/2018 2053   ALT 31 12/10/2018 2053   ALKPHOS 136 (H) 12/10/2018 2053   BILITOT 0.4 12/10/2018 2053   GFRNONAA >60 11/07/2019 0453   GFRAA >60 11/07/2019 0453    Assessment and Plan:   Kena Limon is a 63 y.o. y/o female has been referred for severe worsening diarrhea.  She has history of lymphocytic colitis which could be playing a role, however she has been on multiple antibiotics to treat cellulitis since July 2025 including clindamycin, Rocephin, and Augmentin .  I am most suspicious for C. difficile infection given the nature of her diarrhea and recent antibiotic use.  Rectal inflammation on CT likely secondary to infection.  1.  Severe diarrhea with recent antibiotic use: Most concerning for C. difficile infection - I collected Diatherix rectal swab specimen today to check for C. difficile. - I prescribed vancomycin  125 mg 4 times daily x 14 days. - Patient education discussed regarding C.  difficile.  Recommend frequent handwashing and discussed contagious nature of C. difficile.  Disinfect surfaces.  2.  History of lymphocytic colitis.  Has responded to budesonide  in the past, however patient reports budesonide  typically causes recurrent cellulitis. - Pending C. difficile stool test result, decide about restarting budesonide . - If C. difficile is negative and she cannot tolerate budesonide , we could use Imodium and Pepto-Bismol.  3.  Hypokalemia - Lab: BMP - Advise consumption of potassium-rich foods and beverages, such as Gatorade Zero, baked potatoes, avocados, and bananas. - Encouraged hydration and drink plenty of fluids to prevent dehydration.  4.  Leukocytosis - Lab: CBC  5.  Family history of colon cancer father.  She is overdue for 5-year repeat colonoscopy, however we need to make sure C. difficile is negative before scheduling any procedures.  She has multiple comorbidities including history of CVA, mobility impairment, type 2 diabetes, recurrent cellulitis, current cardiology evaluation for possible PVD.  She has appointment with cardiologist next week in [place], [place] for more cardiac testing.  Postpone scheduling colonoscopy until these issues have resolved.    Follow up 4 to 6 weeks with TG.  Also follow-up based on lab results and symptoms.  Ellouise Console, PA-C

## 2024-02-26 NOTE — Patient Instructions (Addendum)
 Your provider has requested that you go to the basement level for lab work before leaving today. Press B on the elevator. The lab is located at the first door on the left as you exit the elevator.  We have sent the following medications to your pharmacy for you to pick up at your convenience: Vancomycin  125 mg four times daily for 2 weeks (14 days)  Your provider has ordered Diatherix stool testing for you. You have received a kit from our office today containing all necessary supplies to complete this test. Please carefully read the stool collection instructions provided in the kit before opening the accompanying materials. In addition, be sure there is a label providing your full name and date of birth on the puritan opti-swab tube that is supplied in the kit (if you do not see a label with this information on your test tube, please make us  aware before test collection!). After completing the test, you should secure the purtian tube into the specimen biohazard bag. The [facility] E-Req sheet (including date and time of specimen collection) should be placed into the outside pocket of the specimen biohazard bag and returned to the [place] lab (basement floor of [facility]) within 3 days of collection. Please make sure to give the specimen to a staff member at the lab. DO NOT leave the specimen on the counter.  If the specimen date and time (can be found in the upper right boxed portion of the sheet) are not filled out on the E-Req sheet, the test will NOT be performed.   Please follow up sooner if symptoms increase or worsen  Due to recent changes in healthcare laws, you may see the results of your imaging and laboratory studies on MyChart before your provider has had a chance to review them.  We understand that in some cases there may be results that are confusing or concerning to you. Not all laboratory results come back in the same time frame and the provider may be  waiting for multiple results in order to interpret others.  Please give us  48 hours in order for your provider to thoroughly review all the results before contacting the office for clarification of your results.   Thank you for trusting me with your gastrointestinal care!   Ellouise Console, PA-C _______________________________________________________  If your blood pressure at your visit was 140/90 or greater, please contact your primary care physician to follow up on this.  _______________________________________________________  If you are age 36 or older, your body mass index should be between 23-30. Your Body mass index is 27.41 kg/m. If this is out of the aforementioned range listed, please consider follow up with your Primary Care Provider.  If you are age 71 or younger, your body mass index should be between 19-25. Your Body mass index is 27.41 kg/m. If this is out of the aformentioned range listed, please consider follow up with your Primary Care Provider.   ________________________________________________________  The [facility] providers would like to encourage you to use MYCHART to communicate with providers for non-urgent requests or questions.  Due to long hold times on the telephone, sending your provider a message by [facility] may be a faster and more efficient way to get a response.  Please allow 48 business hours for a response.  Please remember that this is for non-urgent requests.  _______________________________________________________

## 2024-02-27 ENCOUNTER — Telehealth: Payer: Self-pay | Admitting: Physician Assistant

## 2024-02-27 ENCOUNTER — Telehealth: Payer: Self-pay | Admitting: Gastroenterology

## 2024-02-27 ENCOUNTER — Ambulatory Visit: Payer: Self-pay | Admitting: Physician Assistant

## 2024-02-27 LAB — BASIC METABOLIC PANEL WITH GFR
BUN: 14 mg/dL (ref 6–23)
CO2: 28 meq/L (ref 19–32)
Calcium: 9 mg/dL (ref 8.4–10.5)
Chloride: 100 meq/L (ref 96–112)
Creatinine, Ser: 0.69 mg/dL (ref 0.40–1.20)
GFR: 92.89 mL/min (ref >60.00)
Glucose, Bld: 155 mg/dL — ABNORMAL HIGH (ref 70–99)
Potassium: 2.4 meq/L — CL (ref 3.5–5.1)
Sodium: 139 meq/L (ref 135–145)

## 2024-02-27 MED ORDER — POTASSIUM CHLORIDE ER 20 MEQ PO TBCR: 20.0000 meq | EXTENDED_RELEASE_TABLET | Freq: Two times a day (BID) | ORAL | 0 refills | Status: AC

## 2024-02-27 NOTE — Telephone Encounter (Signed)
 See 02/26/24 CBC result note for additional information.

## 2024-02-27 NOTE — Progress Notes (Signed)
 Call and notify patient labs show: 1.  Critically low potassium 2.4 (normal is 3.5-5.1).  I recommend patient go to the ED for further evaluation and treatment.  We can also send a prescription for potassium chloride  20 mEq 1 tablet twice daily, #60, no refills to her pharmacy.   2.  White count is moderately elevated.  Patient has been having severe diarrhea.  I think she has C. difficile.  I prescribed vancomycin  yesterday.  However she likely needs inpatient treatment due to her labs and severe diarrhea.  She should go to the ED for further evaluation and treatment. 3.  Kidney and liver tests are normal.  Hemoglobin is normal, no anemia. Ellouise Console, PA-C

## 2024-02-27 NOTE — Telephone Encounter (Signed)
 I received a phone call after hours regarding C. difficile test results which is positive.  It appears from the note that she was already started empirically on vancomycin  given clinical concern was high and this test confirms that.  I called the patient this evening and no answer but I left a message on her phone telling her to take the vancomycin  that was prescribed.  POD A RN can you could try calling this patient tomorrow to ensure she gets this message?  If no improvement despite the vancomycin  she should contact us .  Thanks

## 2024-02-27 NOTE — Telephone Encounter (Signed)
 Call and notify patient labs show: 1.  Critically low potassium 2.4 (normal is 3.5-5.1).  I recommend patient go to the ED for further evaluation and treatment.  We can also send a prescription for potassium chloride  20 mEq 1 tablet twice daily, #60, no refills to her pharmacy.   2.  White count is moderately elevated.  Patient has been having severe diarrhea.  I think she has C. difficile.  I prescribed vancomycin  yesterday.  However she likely needs inpatient treatment due to her labs and severe diarrhea.  She should go to the ED for further evaluation and treatment. 3.  Kidney and liver tests are normal.  Hemoglobin is normal, no anemia. Ellouise Console, PA-C

## 2024-02-28 NOTE — Telephone Encounter (Signed)
 [name] thanks for letting me know - I took this call after hours and did not see the message that [name] left her or her labs.  I think that was good advice, she was admitted for hypokalemia and treatment for C. difficile.

## 2024-02-28 NOTE — Telephone Encounter (Signed)
 Dr Leigh-  Patient was contacted yesterday by our office and advised to go to the emergency room for evaluation and further management of critically low potassium (2.4) and possible IV vancomycin  for suspected C Diff.   Patient did go to the hospital and is admitted at [facility] in [place] (closer to her home). Looks like they are addressing the positive C Diff in addition to hypokalemia.

## 2024-02-29 ENCOUNTER — Telehealth: Payer: Self-pay | Admitting: Physician Assistant

## 2024-02-29 NOTE — Discharge Summary (Signed)
 CIP DISCHARGE SUMMARY   [name]                        63 y.o. female MRN # [id number]           DATE OF ADMISSION: 02/27/2024  6:27 PM DATE OF DISCHARGE: 02/29/24 DATE OF SERVICE: 02/29/24          PRINICIPAL DIAGNOSIS / DIAGNOSES  Hypokalemia C diff colitis  SECONDARY DIAGNOSES Active Hospital Problems   Diagnosis Date Noted  . Thyroid nodule 02/29/2024  . C. difficile diarrhea 02/27/2024  . Hypokalemia due to excessive gastrointestinal loss of potassium 02/27/2024  . QT prolongation 02/27/2024  . Type 2 diabetes mellitus without complication, without long-term current use of insulin  (HCC) 02/27/2024  . History of CVA (cerebrovascular accident) 02/27/2024    Resolved Hospital Problems  No resolved problems to display.      DIAGNOSTIC STUDIES   US  CAROTID DOPPLER Reason for Exam: CVA Result Date: 02/29/2024 1.  Predicted bilateral ICA stenosis of 1-50 percent diameter narrowing. 2.  Vertebral arterial flow is antegrade bilaterally. 3.  A right thyroid nodule is identified that measures 20 x 21 x 27 mm.  The appearance is consistent with a TR3 lesion, mildly suspicious.  Imaging follow-up recommended at 1, 3 and 5 years if the lesion is greater than or equal to 1.5 cm.  FNA recommended if the lesion is greater than or equal to 2.5 cm.  Note is made that the lesion size is borderline for imaging follow-up versus FNA. This document was created using medical transcription software.  An effort has been made to ensure the accuracy of the transcription.  Any obvious errors or omissions should be clarified with the author of this document.       LABS AT DISCHARGE Lab Results  Component Value Date   WBC 13.46 (H) 02/29/2024   HGB 13.1 02/29/2024   HCT 39.9 02/29/2024   PLT 266 02/29/2024   Lab Results  Component Value Date   NA 142 02/29/2024   K 3.5 02/29/2024   CL 111 (H) 02/29/2024   CO2 22 02/29/2024   CA 8.7 02/29/2024   BUN 8 02/29/2024   CREAT  0.6 02/29/2024   GLUCOSE 170 (H) 02/29/2024   ANIONGAP 9 02/29/2024   Lab Results  Component Value Date   BILITOTAL 0.3 02/27/2024   ALKPHOS 108 02/27/2024   AST 16 02/27/2024   ALT 11 02/27/2024   ALBUMIN  3.9 02/27/2024               MICROBIOLOGY      BLOOD CULTURE  Component Value Date   BLOODC No growth 02/27/2024   BLOODC No growth 02/27/2024    No results found for: [facility] COURSE  This is a 63 y/o F with PMH including DMII, hx of CVA, HTN and recently diagnosed c diff colitis who presented to the ED due to hypokalemia noted on outpatient labs. Pts potassium level was 2.1 on arrival.  [facility] was started on aggressive potassium replacement, oral dificid.  Her diarrhea improved significantly and potassium level is 3.5 today.  She feels near her baseline and is being discharged ins table condition.  Of note she did have a carotid US  here that was scheduled as an outpatient.  There was an incidental finding of:  A right thyroid nodule is identified that measures 20 x 21 x 27 mm.  The appearance is consistent with  a TR3 lesion, mildly suspicious.  Imaging follow-up recommended at 1, 3 and 5 years if the lesion is greater than or equal to 1.5 cm.  FNA recommended if the lesion is greater than or equal to 2.5 cm.  Note is made that the lesion size is borderline for imaging follow-up versus FNA.   Her TSH is normal and FT4 and FT3 are pending.  Recommend outpt endocrinology evaluation for possible FNA. This was discussed with her in detail today and she voiced understanding.   CONSULTS IP CONSULT TO CASE MANAGEMENT   DISCHARGE EXAM  BP 121/76   Pulse 84   Temp 98.2 F (36.8 C) (Oral)   Resp 19   Ht 5' 6.5 (1.689 m)   Wt 79.4 kg (175 lb)   SpO2 99%   BMI 27.82 kg/m  Oxygen Therapy O2 Device: room air SpO2: 99 %    Physical Exam      General Alert and cooperative. Looks comfortable. No distress.   Heart Regular rate and rhythm.  Normal S1, S2.  Lungs Clear to auscultation bilaterally.  Abdomen   Soft, non-tender, no rebound or guarding  Extremities No edema. No cyanosis  Neurological Non focal.        DISPOSITION  home    DISCHARGE MEDICATIONS   Medication List     CHANGE how you take these medications    potassium chloride  20 mEq Extended Release tablet Commonly known as: K-DUR Take 1 Tablet (20 mEq) by mouth in the morning. What changed: when to take this       CONTINUE taking these medications    amLODIPine  5 mg tablet Commonly known as: NORVASC  Take 5 mg by mouth in the morning.   atorvastatin  20 mg tablet Commonly known as: LIPITOR Take 20 mg by mouth in the morning.   budesonide  9 mg Extended Release 24 hour tablet Commonly known as: UCERIS  Take 9 mg by mouth Daily as needed for Other (See Comment).   clopidogreL 75 mg Tablet Commonly known as: PLAVIX Take 75 mg by mouth in the morning.   gabapentin  300 mg capsule Commonly known as: NEURONTIN  Take 600 mg by mouth in the morning and 600 mg at noon and 600 mg before bedtime.   Jardiance 25 mg Generic drug: empagliflozin Take 25 mg by mouth in the morning.   lisinopriL  5 mg tablet Commonly known as: PRINIVIL  Take 5 mg by mouth in the morning.   metFORMIN 500 mg tablet Commonly known as: GLUCOPHAGE Take 500 mg by mouth Daily with breakfast.   ondansetron  4 mg Tablet, Rapid Dissolve Commonly known as: ZOFRAN  ODT Place 1 Tablet (4 mg) under tongue Every 8 hours as needed for Nausea/Emesis.   traMADoL  50 mg tablet Commonly known as: ULTRAM  Take 50 mg by mouth Every 6 hours as needed.   vancomycin  250 mg Capsule Commonly known as: VANCOCIN  Take 250 mg by mouth Every 6 hours.            FOLLOW UP  PCP  Follow up in 1 week(s)   Milagros Said, MD [address] [place] [address] [phone]  Follow up in 3 week(s) or any endocrinologist, new pt referral for thyroid nodule may need  FNA    MISCELLANEOUS / ABNORMAL LAB VALUES THAT NEED OUTPATIENT FOLLOW UP   BMP in 5 days  FT4 and FT3 are drawn and pending  RESPIRATORY Oxygen Therapy O2 Device: room air SpO2: 99 %   DIET Diet - Restrictions; Diabetic;  60gm Carbohydrate/meal; Yes. Please alert nurse prior to tray delivery.   CODE STATUS Full Code   DISCHARGE TIME 35 Minutes spent in the d/c process today.      _______________________________________________________________    Signed: Jenna L. Delores, MD      02/29/2024  10:06 AM

## 2024-02-29 NOTE — Telephone Encounter (Signed)
 Patient calling with hospital update. States she is no longer admitted. Please advise

## 2024-02-29 NOTE — Telephone Encounter (Signed)
 Patient is calling to let Ellouise know that she has now been discharged from the hospital. They were able to get potassium to 3.5. They provided Dificid in the hospital and patient diarrhea improved. However, they told her to pick up the vancomycin  prescribed by our office and begin that course since remainder of Dificid prescription would cost too much outpatient. Patient states so far today she has had only 3 bowel movements. She is scheduled to see PCP next week.

## 2024-02-29 NOTE — Telephone Encounter (Signed)
 C. difficile stool test is positive.  Patient was already notified admitted to the hospital 02/27/2024 currently undergoing treatment. Ellouise Console, PA-C

## 2024-03-10 ENCOUNTER — Encounter: Payer: Self-pay | Admitting: Physician Assistant

## 2024-03-11 ENCOUNTER — Telehealth: Payer: Self-pay | Admitting: Physician Assistant

## 2024-03-11 NOTE — Telephone Encounter (Signed)
 Inbound call from patient calling to give an update on her symptoms since being proscribed Vancomycin . Please advise.

## 2024-03-12 NOTE — Telephone Encounter (Signed)
 Patient called to provide an update on C Diff symptoms following hospitalization.   Patient states that her diarrheal symptoms have significantly improved and she is now having normal stools with only small amounts of mucus and occasional rectal bleeding with wiping. No longer having urgency or incontinent episodes.   Patient did have several questions:  1-Does vancomycin  cause lower extremity swelling? Has had lower leg swelling x 5 days.   I advised that this is not a typical side effect of vancomycin  though always possible. She says she went to cardiologist this week and was told veins in legs and carotids looked fine. States there was a very minute blood clot but cardiologist was not concerned since it is so small and she is on plavix. Patient also states that she has lower extremity edema with any antibiotic that she takes.  2-Should she take vancomycin  for the full 14 days? She was hospitalized for 2 days and was given difficid at that time. She is currently taking the vancomycin  rx from [facility] but isnt sure how long she should take it.   I have advised that she should take the full 14 day course of vancomycin  as it will provide extra coverage to make sure C Diff bacteria has been fully eliminated.  3-Tina mentioned completing colonoscopy but wanted to hold off due to C Diff infection. How long does she need to wait to schedule colonoscopy after C Diff infection?

## 2024-03-14 NOTE — Telephone Encounter (Signed)
 Provided patient with Tina's response. She has scheduled follow up with Ellouise on 05/05/24 at 3 pm to discuss follow up plan and colonoscopy.

## 2024-05-05 ENCOUNTER — Ambulatory Visit: Admitting: Physician Assistant

## 2024-05-25 NOTE — Progress Notes (Unsigned)
 "     Anna Console, PA-C [address] [place], [place]  [address] Phone: [phone]   Gastroenterology Consultation  Referring Provider:     Tanda Barnes DEL, MD Primary Care Physician:  [name] DEL, MD Primary Gastroenterologist:  Anna Console, PA-C / Anna Naval, MD  Reason for Consultation:     Follow-up C. difficile infection and lymphocytic colitis.        HPI:   Discussed the use of AI scribe software for clinical note transcription with the patient, who gave verbal consent to proceed.  64 year old female, established patient Dr. Naval, presents for follow-up of diarrhea.  I last saw her 02/2024 to treat C. difficile diarrhea.  She has history of lymphocytic colitis (treated with budesonide  in the past) and C. difficile diarrhea treated with vancomycin  02/2024.  She required hospitalization 02/2024 due to her C. difficile infection and severe diarrhea.  She was treated with Dificid on hospital.  She had previously taken clindamycin, Rocephin, and Augmentin  for leg infection/cellulitis 12/2023.  C. difficile diarrhea started after that.  Patient was hospitalized at [facility] 02/27/2024 - 02/29/2024 for hypokalemia and C. difficile colitis.  She was treated with Dificid and an C. difficile diarrhea resolved.    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  Type 2 diabetes, recurrent cellulitis, PVD.   Hypertension, thyroid nodules, hx of left breast cancer (dx 2017, estrogen receptor positive, s/p radiation therapy), depression/anxiety, lymphocytic colitis, obesity.  Currently on Plavix 75 mg daily.  Patient reports she recently relocated from [facility] to [place]. [place] in May 2025.  Currently lives with her daughter.  She has established care with a new PCP through [facility].  05/2017 last colonoscopy by Dr. Aneita: No polyps.  Small internal hemorrhoids.  5-year repeat due to Family history of father who had colon cancer before  age 55.  2009 colonoscopy by Dr. Aneita: No polyps.  Colon biopsies positive for lymphocytic colitis.  History of Present Illness [name] is a 64 year old female with lymphocytic colitis and recent Clostridioides difficile infection who presents for management of chronic diarrhea.  Chronic Diarrhea: - Present for years, typically 1-2 bowel movements daily - Frequency increases to 3 per day for over two days, prompting initiation of budesonide . - Budesonide   works to control her lymphocytic colitis - Budesonide  avoided when possible due to recurrent cellulitis with each course - Imodium ineffective, described as just like a placebo - Occasional gas and bloating, sometimes causing nausea; trapped gas resolves spontaneously - No current use of antidiarrheal medications; last budesonide  dose was prior to recent hospitalization several months ago - Concern for potassium deficiency due to frequent diarrhea, with prior hospitalization requiring potassium drip - NSAIDs avoided; Tylenol  used for pain but ineffective  Recent Clostridioides difficile Infection and Hypokalemia: - Four-day hospitalization for C. difficile infection and hypokalemia - Treated with Dificid, vancomycin , and potassium drip - No recurrence of C. difficile symptoms since completing treatment - No more watery diarrhea - Maintains Florastor and other probiotics at home to support gut flora - Labs are monitored through PCP.  Colorectal Cancer Screening and Family History: - Colonoscopies in 2009 and 2019 negative for polyps - Family history of colon cancer in father diagnosed age 52   Past Medical History:  Diagnosis Date   Anxiety    Arthritis    lower back   Breast cancer (HCC)    Breast cancer of upper-outer quadrant of left female  breast (HCC) 01/03/2016   Complication of anesthesia    hx. of combativeness as child; had severe migraine headache day after back surgery   Cough due to ACE inhibitor  01/28/2016   Depression    Eczema    right hip   High cholesterol    History of radiation therapy 05/29/16- 07/14/16   Left Breast/ 50.4 Gy n 25 fractions, Left Breast Boost/ 10 Gy in 5 fractions.    Hypertension    states under control with meds., has been on med. since age 83   Lymphocytic colitis    Migraines    Personal history of radiation therapy    PONV (postoperative nausea and vomiting)    Sciatica    Sinus drainage 01/28/2016   Stroke (HCC) 12/29/2022   Urinary urgency     Past Surgical History:  Procedure Laterality Date   ANTERIOR LAT LUMBAR FUSION Left 07/11/2017   Procedure: LEFT EXTREME LATERAL INTERBODY FUSION AND INSTRUMENTATION AND ALLOGRAFT LUMBAR 1-2/2-3;  Surgeon: Beuford Anes, MD;  Location: [facility];  Service: Orthopedics;  Laterality: Left;   BREAST BIOPSY     BREAST LUMPECTOMY     left 2017   BREAST LUMPECTOMY WITH NEEDLE LOCALIZATION AND AXILLARY SENTINEL LYMPH NODE BX Left 02/04/2016   Procedure: LEFT BREAST LUMPECTOMY WITH 2 NEEDLE LOCALIZATIONS AND LEFT AXILLARY SENTINEL LYMPH NODE BIOPSY;  Surgeon: Alm Angle, MD;  Location: [facility];  Service: General;  Laterality: Left;  LEFT BREAST LUMPECTOMY WITH 2 NEEDLE LOCALIZATIONS AND LEFT AXILLARY SENTINEL LYMPH NODE BIOPSY   CESAREAN SECTION  1989, 1997   COLONOSCOPY     Hx: of   CYSTO  09/29/2005   with anterior repair   ENDOMETRIAL ABLATION  09/29/2005   INCISION AND DRAINAGE ABSCESS Left 02/21/2016   Procedure: INCISION AND DRAINAGE ABSCESS;  Surgeon: Morene Olives, MD;  Location: [facility];  Service: General;  Laterality: Left;   INCONTINENCE SURGERY  09/29/2005   KNEE ARTHROSCOPY W/ DEBRIDEMENT     LUMBAR FUSION  04/28/2013   L3-4   TONSILLECTOMY     age 63   TUBAL LIGATION     WISDOM TOOTH EXTRACTION     as a teenager    Prior to Admission medications  Medication Sig Start Date End Date Taking? Authorizing Provider  amLODipine  (NORVASC ) 5 MG tablet Take 5 mg by mouth daily.     [provider]  atorvastatin  (LIPITOR) 40 MG tablet Take 40 mg by mouth daily.    [provider]  budesonide  (ENTOCORT EC ) 3 MG 24 hr capsule TAKE THREE CAPSULES BY MOUTH DAILY 12/19/19   Aneita Gwendlyn DASEN, MD  clopidogrel (PLAVIX) 75 MG tablet Take 75 mg by mouth. 08/14/23 08/13/24  [provider]  empagliflozin (JARDIANCE) 25 MG TABS tablet Take 25 mg by mouth.    [provider]  gabapentin  (NEURONTIN ) 300 MG capsule Take 600 mg by mouth 3 (three) times daily. 12/20/23   [provider]  lisinopril -hydrochlorothiazide  (ZESTORETIC ) 10-12.5 MG tablet Take 1 tablet by mouth daily.    [provider]  metFORMIN (GLUCOPHAGE) 500 MG tablet Take 500 mg by mouth 2 (two) times daily with a meal. 12/13/18   [provider]  Potassium Chloride  ER 20 MEQ TBCR Take 1 tablet (20 mEq total) by mouth 2 (two) times daily. 02/27/24   [place], PA-C    Family History  Problem Relation Age of Onset   Colon cancer Father        44-65  Hypertension Father    Colon polyps Other    Diabetes Other    Breast cancer Paternal Aunt      Social History[1]  Allergies as of 05/30/2024 - Review Complete 05/30/2024  Allergen Reaction Noted   Nsaids Other (See Comments) 04/15/2013   Soap Shortness Of Breath and Rash 01/28/2016   Augmentin  [amoxicillin -pot clavulanate] Nausea And Vomiting 12/16/2015   Latex Itching and Other (See Comments)    Tape Other (See Comments) 04/28/2013   Bupropion  03/18/2018   Doxycycline Nausea And Vomiting 11/02/2019   Duloxetine Diarrhea and Nausea Only 05/09/2018   Chlorhexidine  gluconate Rash 02/21/2016   Mupirocin Rash 04/18/2013   Phenergan  [promethazine  hcl] Rash 07/14/2017    Review of Systems:    All systems reviewed and negative except where noted in HPI.   Physical Exam:  BP 120/76   Pulse 96   Ht 5' 7 (1.702 m)   Wt 177 lb 2 oz (80.3 kg)   BMI 27.74 kg/m  No LMP recorded. Patient is  postmenopausal.  General:   Alert,  Well-developed, well-nourished, pleasant and cooperative in NAD Lungs:  Respirations even and unlabored.  Clear throughout to auscultation.   No wheezes, crackles, or rhonchi. No acute distress. Heart:  Regular rate and rhythm; no murmurs, clicks, rubs, or gallops. Abdomen:  Normal bowel sounds.  No bruits.  Soft, and non-distended without masses, hepatosplenomegaly or hernias noted.  No Tenderness.  No guarding or rebound tenderness.    Neurologic:  Alert and oriented x3;  grossly normal neurologically. Psych:  Alert and cooperative. Normal mood and affect.   Imaging Studies: No results found.  Labs: CBC BLOOD, HEMATOLOGY  [facility]11/24/2025 Component 03/31/2024       WBC 9.99   RBC 4.97   Hemoglobin 14.7   Hematocrit 45   Mean Corpuscular Volume (MCV) 90   Mean Corpuscular Hemoglobin (MCH) 30   Mean Corpuscular Hemoglobin Conc (MCHC) 33   Red Cell Distribution Width (RDW) 15.3   Platelet Count (PLT) 267     CMP  Ryan symptoms like symptoms [facility] CHEMISTR[facility]  [facility]11/24/2025 Component 03/31/2024 03/31/2024 03/05/2024         Sodium 139 -- 137   Potassium 3.7 -- 4.2   Chloride 105 -- 107   CO2 25 -- 20 Low      Anion Gap 9 -- 10   Glucose, Random 131 High    -- 118   Blood Urea Nitrogen (BUN) 14 -- 13   Creatinine 0.64 -- 0.70   eGFR >90       Assessment and Plan:   Tashira Torre is a 64 y.o. y/o female has been referred for:   1.  Lymphocytic colitis originally diagnosed in 2009.  Responded to budesonide  in the past.  2.  Recent C. difficile infection treated with vancomycin  and Dificid and required hospitalization 02/2024.  Currently resolved.  3.  Family history of colon cancer father before age 105  45.  Colon cancer screening: Patient had 2 colonoscopies in 2009 and 2019 which showed no polyps. Assessment & Plan Lymphocytic colitis Chronic lymphocytic colitis with diarrhea flares. Budesonide   effective but causes cellulitis. Imodium ineffective. Dietary triggers and potassium deficiency risk noted. - Prescribed Lomotil as needed for diarrhea. - Recommended bismuth subsalicylate.  Warned patient this can cause black stools. - Advised NSAID avoidance. - Discussed dietary management and bowel movement monitoring. - Instructed to stop antidiarrheals 5-7 days before colonoscopy. - Recommended Florastor probiotic for gas, bloating, and C.  difficile prevention.  History of Clostridioides difficile infection: Currently resolved. Recent infection treated with vancomycin  and Dificid in hospital.  Watery diarrhea resolved.  Lymphocytic colitis symptoms persist. Aware of recurrence risk and cautious with antibiotics. - Recommended probiotic for recurrence prevention. - Caution against using frequent antibiotics, especially clindamycin. - Please let us  know if she has recurrent watery diarrhea in the future.  Family history of colon cancer (father age 88) Due for 5-year repeat colonoscopy.  - Scheduling Colonoscopy I discussed risks of colonoscopy with patient to include risk of bleeding, colon perforation, and risk of sedation.  Patient expressed understanding and agrees to proceed with colonoscopy.  - Planned to contact Dr. Rumalda Pouch (PCP) to hold clopidogrel 5 days prior. - Instructed to take aspirin  81 mg during clopidogrel hold, then resume clopidogrel post-colonoscopy. - Advised clear liquid diet one day prior with blood glucose management.  Hypokalemia: Currently resolved and monitored through PCP. History of hypokalemia with prior hospitalization. Ongoing potassium chloride  supplementation. Motivated to prevent recurrence during diarrhea flares. - Continued potassium chloride  supplementation. - Provided dietary counseling on potassium-rich foods. - Reinforced monitoring for hypokalemia through PCP. - Confirmed primary care will check potassium with upcoming labs.   Follow up  based on colonoscopy results and GI symptoms.  Anna Console, PA-C       [1]  Social History Tobacco Use   Smoking status: Every Day    Current packs/day: 1.00    Average packs/day: 1 pack/day for 12.0 years (12.0 ttl pk-yrs)    Types: Cigarettes   Smokeless tobacco: Never   Tobacco comments:    Tobacco info given 04/04/17  Vaping Use   Vaping status: Never Used  Substance Use Topics   Alcohol use: No   Drug use: No   "

## 2024-05-30 ENCOUNTER — Ambulatory Visit: Admitting: Physician Assistant

## 2024-05-30 ENCOUNTER — Telehealth: Payer: Self-pay

## 2024-05-30 ENCOUNTER — Encounter: Payer: Self-pay | Admitting: Physician Assistant

## 2024-05-30 VITALS — BP 120/76 | HR 96 | Ht 67.0 in | Wt 177.1 lb

## 2024-05-30 DIAGNOSIS — Z7901 Long term (current) use of anticoagulants: Secondary | ICD-10-CM

## 2024-05-30 DIAGNOSIS — Z800 Family history of malignant neoplasm of digestive organs: Secondary | ICD-10-CM

## 2024-05-30 DIAGNOSIS — K529 Noninfective gastroenteritis and colitis, unspecified: Secondary | ICD-10-CM

## 2024-05-30 DIAGNOSIS — Z8619 Personal history of other infectious and parasitic diseases: Secondary | ICD-10-CM

## 2024-05-30 DIAGNOSIS — Z1211 Encounter for screening for malignant neoplasm of colon: Secondary | ICD-10-CM

## 2024-05-30 DIAGNOSIS — K52832 Lymphocytic colitis: Secondary | ICD-10-CM

## 2024-05-30 MED ORDER — DIPHENOXYLATE-ATROPINE 2.5-0.025 MG PO TABS: 1.0000 | ORAL_TABLET | Freq: Four times a day (QID) | ORAL | 3 refills | Status: AC | PRN

## 2024-05-30 MED ORDER — NA SULFATE-K SULFATE-MG SULF 17.5-3.13-1.6 GM/177ML PO SOLN: 1.0000 | Freq: Once | ORAL | 0 refills | Status: AC

## 2024-05-30 NOTE — Patient Instructions (Addendum)
 If you have a flareup of lymphocytic colitis diarrhea, then I prescribed Lomotil 1 tablet up to 4 times daily as needed for diarrhea.  You can also take over-the-counter Pepto-Bismol as needed to help control diarrhea.  I recommend take a probiotic daily.  Finish the probiotic that you have at home. If you need another probiotic in the future, I recommend OTC Florastor 1 capsule twice daily.  You have been scheduled for a colonoscopy. Please follow written instructions given to you at your visit today.   If you use inhalers (even only as needed), please bring them with you on the day of your procedure.  DO NOT TAKE 7 DAYS PRIOR TO TEST- Trulicity (dulaglutide) Ozempic, Wegovy (semaglutide) Mounjaro, Zepbound (tirzepatide) Bydureon Bcise (exanatide extended release)  DO NOT TAKE 1 DAY PRIOR TO YOUR TEST Rybelsus (semaglutide) Adlyxin (lixisenatide) Victoza (liraglutide) Byetta (exanatide) ___________________________________________________________________________   Thank you for trusting me with your gastrointestinal care!   Ellouise Console, PA-C  _______________________________________________________  If your blood pressure at your visit was 140/90 or greater, please contact your primary care physician to follow up on this.  _______________________________________________________  If you are age 16 or older, your body mass index should be between 23-30. Your Body mass index is 27.74 kg/m. If this is out of the aforementioned range listed, please consider follow up with your Primary Care Provider.  If you are age 63 or younger, your body mass index should be between 19-25. Your Body mass index is 27.74 kg/m. If this is out of the aformentioned range listed, please consider follow up with your Primary Care Provider.   ________________________________________________________  The [facility] providers would like to encourage you to use MYCHART to communicate with providers for  non-urgent requests or questions.  Due to long hold times on the telephone, sending your provider a message by [facility] may be a faster and more efficient way to get a response.  Please allow 48 business hours for a response.  Please remember that this is for non-urgent requests.  _______________________________________________________  [facility] is using a team-based approach to care.  Your team is made up of your doctor and two to three APPS. Our APPS (Nurse Practitioners and Physician Assistants) work with your physician to ensure care continuity for you. They are fully qualified to address your health concerns and develop a treatment plan. They communicate directly with your gastroenterologist to care for you. Seeing the Advanced Practice Practitioners on your physician's team can help you by facilitating care more promptly, often allowing for earlier appointments, access to diagnostic testing, procedures, and other specialty referrals.

## 2024-05-30 NOTE — Telephone Encounter (Signed)
"  ° °  [facility] [address] [place], [place]  [address] Phone:  [phone]   Fax:  [phone]      Eleena Grater 1961-04-13 [id number]  05/30/2024     Dear Rumalda Slade, PA-C:  We have scheduled the above named patient for a colonoscopy procedure. Our records show that she is on anticoagulation therapy.  Please advise as to whether the patient may come off their therapy of Plavix 5 days prior to their procedure which is scheduled for 07/03/24.  We have recommended that she takes a baby Aspirin  during the 5 days hold.  Please route your response to [facility], CMA or fax response to [phone].  Sincerely,    [facility]   "

## 2024-06-01 NOTE — Progress Notes (Signed)
 Agree with assessment and plan as outlined.  Unusual that cellulitis would be associated with budesonide , but agree would treat with nonsteroidal options if possible

## 2024-07-03 ENCOUNTER — Encounter: Admitting: Gastroenterology
